# Patient Record
Sex: Female | Born: 1945 | Race: White | Hispanic: No | Smoking: Never smoker
Health system: Southern US, Community
[De-identification: ages and names within clinical notes are randomized; demographics above are authoritative.]

## PROBLEM LIST (undated history)

## (undated) DIAGNOSIS — M1990 Unspecified osteoarthritis, unspecified site: Secondary | ICD-10-CM

## (undated) DIAGNOSIS — E039 Hypothyroidism, unspecified: Secondary | ICD-10-CM

## (undated) DIAGNOSIS — E079 Disorder of thyroid, unspecified: Secondary | ICD-10-CM

## (undated) DIAGNOSIS — D126 Benign neoplasm of colon, unspecified: Secondary | ICD-10-CM

## (undated) DIAGNOSIS — D72819 Decreased white blood cell count, unspecified: Secondary | ICD-10-CM

## (undated) HISTORY — DX: Disorder of thyroid, unspecified: E07.9

## (undated) HISTORY — PX: ABDOMINAL HYSTERECTOMY: SHX81

## (undated) HISTORY — DX: Unspecified osteoarthritis, unspecified site: M19.90

## (undated) HISTORY — PX: COLONOSCOPY: SHX174

## (undated) HISTORY — PX: CARPAL TUNNEL RELEASE: SHX101

## (undated) HISTORY — DX: Decreased white blood cell count, unspecified: D72.819

## (undated) HISTORY — PX: OTHER SURGICAL HISTORY: SHX169

---

## 2005-03-12 ENCOUNTER — Ambulatory Visit: Payer: Self-pay | Admitting: Family Medicine

## 2005-04-04 ENCOUNTER — Ambulatory Visit: Payer: Self-pay | Admitting: Family Medicine

## 2005-12-18 ENCOUNTER — Encounter: Payer: Self-pay | Admitting: Family Medicine

## 2006-03-17 ENCOUNTER — Ambulatory Visit: Payer: Self-pay | Admitting: Family Medicine

## 2006-07-31 ENCOUNTER — Ambulatory Visit: Payer: Self-pay | Admitting: Family Medicine

## 2006-11-25 ENCOUNTER — Ambulatory Visit: Payer: Self-pay | Admitting: Family Medicine

## 2007-01-21 ENCOUNTER — Ambulatory Visit: Payer: Self-pay | Admitting: Family Medicine

## 2007-04-23 ENCOUNTER — Encounter: Payer: Self-pay | Admitting: Family Medicine

## 2007-04-23 DIAGNOSIS — R799 Abnormal finding of blood chemistry, unspecified: Secondary | ICD-10-CM | POA: Insufficient documentation

## 2007-04-23 DIAGNOSIS — K449 Diaphragmatic hernia without obstruction or gangrene: Secondary | ICD-10-CM | POA: Insufficient documentation

## 2007-04-23 DIAGNOSIS — N951 Menopausal and female climacteric states: Secondary | ICD-10-CM

## 2007-04-23 DIAGNOSIS — E785 Hyperlipidemia, unspecified: Secondary | ICD-10-CM | POA: Insufficient documentation

## 2007-04-27 ENCOUNTER — Ambulatory Visit: Payer: Self-pay | Admitting: Family Medicine

## 2007-04-27 DIAGNOSIS — M8580 Other specified disorders of bone density and structure, unspecified site: Secondary | ICD-10-CM

## 2007-04-27 DIAGNOSIS — F5102 Adjustment insomnia: Secondary | ICD-10-CM | POA: Insufficient documentation

## 2007-04-29 LAB — CONVERTED CEMR LAB
ALT: 13 units/L (ref 0–40)
AST: 19 units/L (ref 0–37)
Albumin: 3.9 g/dL (ref 3.5–5.2)
Basophils Absolute: 0 10*3/uL (ref 0.0–0.1)
Calcium: 9.3 mg/dL (ref 8.4–10.5)
Chloride: 105 meq/L (ref 96–112)
Creatinine, Ser: 0.9 mg/dL (ref 0.4–1.2)
Direct LDL: 145 mg/dL
Eosinophils Relative: 3.1 % (ref 0.0–5.0)
HCT: 37.7 % (ref 36.0–46.0)
MCHC: 35.4 g/dL (ref 30.0–36.0)
Neutrophils Relative %: 49.5 % (ref 43.0–77.0)
Platelets: 246 10*3/uL (ref 150–400)
RBC: 4.11 M/uL (ref 3.87–5.11)
RDW: 12.3 % (ref 11.5–14.6)
Sodium: 141 meq/L (ref 135–145)
Total Bilirubin: 2 mg/dL — ABNORMAL HIGH (ref 0.3–1.2)
Total CHOL/HDL Ratio: 5
Triglycerides: 134 mg/dL (ref 0–149)
WBC: 4.9 10*3/uL (ref 4.5–10.5)

## 2007-07-30 ENCOUNTER — Encounter: Payer: Self-pay | Admitting: Family Medicine

## 2007-11-05 ENCOUNTER — Telehealth: Payer: Self-pay | Admitting: Family Medicine

## 2007-11-26 ENCOUNTER — Encounter: Payer: Self-pay | Admitting: Family Medicine

## 2007-11-26 ENCOUNTER — Ambulatory Visit: Payer: Self-pay | Admitting: Unknown Physician Specialty

## 2007-12-21 ENCOUNTER — Encounter: Payer: Self-pay | Admitting: Family Medicine

## 2007-12-29 ENCOUNTER — Encounter (INDEPENDENT_AMBULATORY_CARE_PROVIDER_SITE_OTHER): Payer: Self-pay | Admitting: *Deleted

## 2008-03-15 ENCOUNTER — Telehealth: Payer: Self-pay | Admitting: Family Medicine

## 2008-04-21 ENCOUNTER — Ambulatory Visit: Payer: Self-pay | Admitting: Family Medicine

## 2008-04-28 ENCOUNTER — Telehealth: Payer: Self-pay | Admitting: Family Medicine

## 2008-04-28 DIAGNOSIS — K117 Disturbances of salivary secretion: Secondary | ICD-10-CM | POA: Insufficient documentation

## 2008-05-01 ENCOUNTER — Ambulatory Visit: Payer: Self-pay | Admitting: Family Medicine

## 2008-05-02 DIAGNOSIS — D72819 Decreased white blood cell count, unspecified: Secondary | ICD-10-CM | POA: Insufficient documentation

## 2008-05-02 LAB — CONVERTED CEMR LAB
ALT: 15 units/L (ref 0–35)
Alkaline Phosphatase: 64 units/L (ref 39–117)
Basophils Absolute: 0 10*3/uL (ref 0.0–0.1)
Bilirubin, Direct: 0.2 mg/dL (ref 0.0–0.3)
CO2: 29 meq/L (ref 19–32)
Calcium: 9.1 mg/dL (ref 8.4–10.5)
Cholesterol: 166 mg/dL (ref 0–200)
GFR calc Af Amer: 129 mL/min
Glucose, Bld: 99 mg/dL (ref 70–99)
HDL: 39.5 mg/dL (ref >39.0)
LDL Cholesterol: 113 mg/dL — ABNORMAL HIGH (ref 0–99)
Lymphocytes Relative: 36.2 % (ref 12.0–46.0)
MCHC: 34.9 g/dL (ref 30.0–36.0)
Monocytes Relative: 7.1 % (ref 3.0–12.0)
Neutro Abs: 1.6 10*3/uL (ref 1.4–7.7)
Neutrophils Relative %: 53 % (ref 43.0–77.0)
Platelets: 175 10*3/uL (ref 150–400)
Potassium: 4.3 meq/L (ref 3.5–5.1)
RDW: 11.9 % (ref 11.5–14.6)
Sodium: 143 meq/L (ref 135–145)
Total Bilirubin: 2.2 mg/dL — ABNORMAL HIGH (ref 0.3–1.2)
Total CHOL/HDL Ratio: 4.2
Total Protein: 6.3 g/dL (ref 6.0–8.3)
Triglycerides: 68 mg/dL (ref 0–149)
VLDL: 14 mg/dL (ref 0–40)

## 2008-05-03 ENCOUNTER — Encounter (INDEPENDENT_AMBULATORY_CARE_PROVIDER_SITE_OTHER): Payer: Self-pay | Admitting: *Deleted

## 2008-05-03 LAB — CONVERTED CEMR LAB
ANA Titer 1: 1:40 {titer} — ABNORMAL HIGH
Anti Nuclear Antibody(ANA): POSITIVE — AB
Vit D, 1,25-Dihydroxy: 22 — ABNORMAL LOW (ref 30–89)

## 2008-05-04 ENCOUNTER — Ambulatory Visit: Payer: Self-pay | Admitting: Oncology

## 2008-05-10 ENCOUNTER — Ambulatory Visit: Payer: Self-pay | Admitting: Internal Medicine

## 2008-05-16 ENCOUNTER — Encounter: Payer: Self-pay | Admitting: Family Medicine

## 2008-05-16 LAB — CBC WITH DIFFERENTIAL (CANCER CENTER ONLY)
BASO#: 0 10*3/uL (ref 0.0–0.2)
EOS%: 2.3 % (ref 0.0–7.0)
LYMPH%: 32.2 % (ref 14.0–48.0)
MCH: 30.4 pg (ref 26.0–34.0)
MCHC: 35.8 g/dL (ref 32.0–36.0)
MONO%: 4.3 % (ref 0.0–13.0)
NEUT#: 2.7 10*3/uL (ref 1.5–6.5)
Platelets: 231 10*3/uL (ref 145–400)

## 2008-05-16 LAB — MORPHOLOGY - CHCC SATELLITE: RBC Comments: NORMAL

## 2008-05-18 LAB — COMPREHENSIVE METABOLIC PANEL
BUN: 12 mg/dL (ref 6–23)
CO2: 27 mEq/L (ref 19–32)
Calcium: 9.3 mg/dL (ref 8.4–10.5)
Chloride: 106 mEq/L (ref 96–112)
Creatinine, Ser: 0.68 mg/dL (ref 0.40–1.20)
Total Bilirubin: 2.1 mg/dL — ABNORMAL HIGH (ref 0.3–1.2)

## 2008-05-18 LAB — IRON AND TIBC
%SAT: 34 % (ref 20–55)
Iron: 74 ug/dL (ref 42–145)
TIBC: 218 ug/dL — ABNORMAL LOW (ref 250–470)
UIBC: 144 ug/dL

## 2008-05-18 LAB — FOLATE: Folate: >20 ng/mL

## 2008-05-18 LAB — PROTEIN ELECTROPHORESIS, SERUM
Alpha-1-Globulin: 4.7 % (ref 2.9–4.9)
Alpha-2-Globulin: 9 % (ref 7.1–11.8)
Beta 2: 6.6 % — ABNORMAL HIGH (ref 3.2–6.5)
Beta Globulin: 5.6 % (ref 4.7–7.2)
Gamma Globulin: 16.3 % (ref 11.1–18.8)

## 2008-05-18 LAB — FERRITIN: Ferritin: 202 ng/mL (ref 10–291)

## 2008-05-18 LAB — VITAMIN B12: Vitamin B-12: 511 pg/mL (ref 211–911)

## 2008-05-23 ENCOUNTER — Ambulatory Visit: Payer: Self-pay | Admitting: Family Medicine

## 2008-06-06 ENCOUNTER — Encounter: Payer: Self-pay | Admitting: Family Medicine

## 2008-06-15 ENCOUNTER — Ambulatory Visit: Payer: Self-pay | Admitting: Oncology

## 2008-06-20 ENCOUNTER — Encounter: Payer: Self-pay | Admitting: Family Medicine

## 2008-06-20 LAB — CBC WITH DIFFERENTIAL (CANCER CENTER ONLY)
BASO#: 0 10*3/uL (ref 0.0–0.2)
EOS%: 1.9 % (ref 0.0–7.0)
HCT: 32.2 % — ABNORMAL LOW (ref 34.8–46.6)
HGB: 11.2 g/dL — ABNORMAL LOW (ref 11.6–15.9)
MCH: 29.7 pg (ref 26.0–34.0)
MCHC: 34.7 g/dL (ref 32.0–36.0)
MCV: 85 fL (ref 81–101)
MONO%: 4.9 % (ref 0.0–13.0)
NEUT%: 56.2 % (ref 39.6–80.0)

## 2008-06-27 ENCOUNTER — Ambulatory Visit: Payer: Self-pay | Admitting: Internal Medicine

## 2008-06-29 ENCOUNTER — Ambulatory Visit: Payer: Self-pay | Admitting: Family Medicine

## 2008-07-04 LAB — CONVERTED CEMR LAB: Vit D, 1,25-Dihydroxy: 48 (ref 30–89)

## 2008-07-05 ENCOUNTER — Telehealth (INDEPENDENT_AMBULATORY_CARE_PROVIDER_SITE_OTHER): Payer: Self-pay | Admitting: *Deleted

## 2008-07-07 ENCOUNTER — Ambulatory Visit: Payer: Self-pay | Admitting: Internal Medicine

## 2008-11-13 ENCOUNTER — Ambulatory Visit: Payer: Self-pay | Admitting: Oncology

## 2008-11-14 ENCOUNTER — Ambulatory Visit: Payer: Self-pay | Admitting: Family Medicine

## 2008-11-14 LAB — CBC WITH DIFFERENTIAL (CANCER CENTER ONLY)
BASO#: 0 10*3/uL (ref 0.0–0.2)
HCT: 37.1 % (ref 34.8–46.6)
HGB: 12.8 g/dL (ref 11.6–15.9)
LYMPH#: 1.7 10*3/uL (ref 0.9–3.3)
MCHC: 34.5 g/dL (ref 32.0–36.0)
MONO#: 0.2 10*3/uL (ref 0.1–0.9)
NEUT%: 55.9 % (ref 39.6–80.0)
WBC: 4.7 10*3/uL (ref 3.9–10.0)

## 2008-11-21 ENCOUNTER — Encounter: Payer: Self-pay | Admitting: Family Medicine

## 2008-11-24 ENCOUNTER — Encounter (INDEPENDENT_AMBULATORY_CARE_PROVIDER_SITE_OTHER): Payer: Self-pay | Admitting: *Deleted

## 2009-04-05 ENCOUNTER — Telehealth: Payer: Self-pay | Admitting: Family Medicine

## 2009-04-05 DIAGNOSIS — Z8669 Personal history of other diseases of the nervous system and sense organs: Secondary | ICD-10-CM

## 2009-04-12 ENCOUNTER — Encounter: Payer: Self-pay | Admitting: Family Medicine

## 2009-10-19 ENCOUNTER — Telehealth: Payer: Self-pay | Admitting: Family Medicine

## 2009-10-22 ENCOUNTER — Encounter: Payer: Self-pay | Admitting: Family Medicine

## 2009-11-17 DIAGNOSIS — G459 Transient cerebral ischemic attack, unspecified: Secondary | ICD-10-CM

## 2009-11-17 HISTORY — DX: Transient cerebral ischemic attack, unspecified: G45.9

## 2009-11-22 ENCOUNTER — Encounter: Payer: Self-pay | Admitting: Family Medicine

## 2009-11-23 ENCOUNTER — Telehealth: Payer: Self-pay | Admitting: Family Medicine

## 2009-12-04 IMAGING — NM NM THYROID IMAGING W/ UPTAKE SINGLE (24 HR)
1 series · 3 of 3 positions shown · non-contrast
Comparison: none

REASON FOR EXAM: hyperthyroidism
COMMENTS:

PROCEDURE:     NM  - NM THYROID Q-JPH 24 HR [DATE]  [DATE]
RESULT:     The patient was given a dose of 146 uCi of Q-JPH. Uptake value
at 7 hours is 33.7%. Uptake at 26 hours is 46.5%. Images of the thyroid
lobes show relatively homogeneous uptake without a focal hyperactive or
hypoactive region.

[Series 1000: (id) thyroid scan · 2.40mm/px · 3 of 3 slices shown]
[im 1/3]
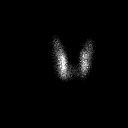
[im 2/3  full-range]
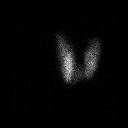
[im 3/3  full-range]
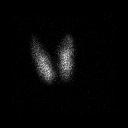

[3 of 3 positions shown; findings below may reference images not displayed]

IMPRESSION: Findings which may represent thyroiditis and
hyperthyroidism.

## 2009-12-18 ENCOUNTER — Telehealth: Payer: Self-pay | Admitting: Family Medicine

## 2009-12-29 ENCOUNTER — Inpatient Hospital Stay: Payer: Self-pay | Admitting: Internal Medicine

## 2009-12-30 ENCOUNTER — Encounter: Payer: Self-pay | Admitting: Family Medicine

## 2010-01-03 ENCOUNTER — Ambulatory Visit: Payer: Self-pay | Admitting: Family Medicine

## 2010-01-03 ENCOUNTER — Encounter: Payer: Self-pay | Admitting: Family Medicine

## 2010-01-07 ENCOUNTER — Encounter (INDEPENDENT_AMBULATORY_CARE_PROVIDER_SITE_OTHER): Payer: Self-pay | Admitting: *Deleted

## 2010-01-07 ENCOUNTER — Encounter: Payer: Self-pay | Admitting: Family Medicine

## 2010-01-17 ENCOUNTER — Encounter: Payer: Self-pay | Admitting: Family Medicine

## 2010-02-14 ENCOUNTER — Ambulatory Visit: Payer: Self-pay | Admitting: Family Medicine

## 2010-02-19 ENCOUNTER — Encounter: Payer: Self-pay | Admitting: Family Medicine

## 2010-10-23 ENCOUNTER — Ambulatory Visit: Payer: Self-pay | Admitting: Family Medicine

## 2010-10-23 DIAGNOSIS — R29898 Other symptoms and signs involving the musculoskeletal system: Secondary | ICD-10-CM

## 2010-10-23 DIAGNOSIS — M1A9XX1 Chronic gout, unspecified, with tophus (tophi): Secondary | ICD-10-CM

## 2010-10-23 DIAGNOSIS — D492 Neoplasm of unspecified behavior of bone, soft tissue, and skin: Secondary | ICD-10-CM | POA: Insufficient documentation

## 2010-10-25 LAB — CONVERTED CEMR LAB: Uric Acid, Serum: 4.7 mg/dL (ref 2.4–7.0)

## 2010-11-04 ENCOUNTER — Telehealth: Payer: Self-pay | Admitting: Family Medicine

## 2010-12-15 LAB — CONVERTED CEMR LAB
Bilirubin Urine: NEGATIVE
Blood in Urine, dipstick: NEGATIVE
Specific Gravity, Urine: 1.02
Urobilinogen, UA: 0.2
pH: 6

## 2010-12-17 NOTE — Letter (Signed)
Summary: [facility]  [facility]   Imported By: Maryln Gottron 02/01/2010 15:36:00  _____________________________________________________________________  External Attachment:    Type:   Image     Comment:   External Document

## 2010-12-17 NOTE — Assessment & Plan Note (Signed)
Summary: CHECK KNOT ON RIGHT ARM/CLE   Vital Signs:  Patient profile:   65 year old female Height:      66.5 inches Weight:      148.25 pounds BMI:     23.65 Temp:     98.9 degrees F oral Pulse rate:   76 / minute Pulse rhythm:   regular BP sitting:   100 / 62  (left arm) Cuff size:   regular  Vitals Entered By: Lewanda Rife LPN (October 23, 2010 11:49 AM) CC: ck knot on upper rt arm   History of Present Illness: had a spot pop up on her arm noticed on nov 26th  has had pain to raise arm all the way for a while - chiropractor and hot compresses helped  that did improve  happened to feel spot on inner upper arm and it was sore  now is smaller than it was is not as sore but a bit tender  did have a bruise for a while -- pretty mild   no trauma that she knows of   this is same arm with carpal tunnel surg in jan  immediately got 5th trigger finger  never did go to therapy  tries to do that at home    feb had tendonitis in other arm   has knots on other elbow that have been here for a while has hx of bad toe pain one time- wonders if that was gout  Allergies: 1)  Sulfa  Past History:  Past Medical History: Last updated: 05/23/2008 Hyperlipidemia osteopenia zoster- arm (past) low vitamin D level borderline ANA hyperthyroid   heme-- Dr Park Breed endo-- Dr Clearance Coots  Past Surgical History: Last updated: 03-06-2010 Hysterectomy - fibroids.  ovaries intact Dexa- normal, osteopenia (02/2001)     osteopenia (09/2003),   decreased BMD (11/2005) Stress echo- neg ([facility] 06/2006) colonscopy 1/09 polyp, diverticulosis hand surg R hand - [facility]  Family History: Last updated: 03-06-10 Father: deceased- CVA, HTN Mother: deceased- lymphoma, arthritis daughter - lymphoma  Siblings: 3 brothers, 2 deceased- 1 from MVA 1 from lymphoma brother CVA P aunt breast ca ? M aunt with breast cancer   aunts- ? ca ? aunt with OP  Social History: Last updated: 04/21/2008 Marital  Status:  Children: 2 Occupation: [employer] non smoker  occ glass of wine  occ goes to [employer]   Risk Factors: Smoking Status: never (04/23/2007)  Review of Systems General:  Denies chills, fatigue, fever, loss of appetite, and malaise. Eyes:  Denies blurring and eye irritation. CV:  Denies chest pain or discomfort and palpitations. Resp:  Denies cough, shortness of breath, and wheezing. GI:  Denies abdominal pain, change in bowel habits, indigestion, nausea, and vomiting. GU:  Denies dysuria and urinary frequency. MS:  Complains of stiffness; denies joint redness and joint swelling; R shoulder was stiff- but better now . Derm:  Denies itching, lesion(s), poor wound healing, and rash. Neuro:  Denies headaches, numbness, and tingling. Endo:  Denies cold intolerance and heat intolerance. Heme:  Denies abnormal bruising and bleeding.  Physical Exam  General:  Well-developed,well-nourished,in no acute distress; alert,appropriate and cooperative throughout examination Head:  normocephalic, atraumatic, and no abnormalities observed.   Eyes:  vision grossly intact, pupils equal, pupils round, and pupils reactive to light.   Neck:  No deformities, masses, or tenderness noted. Chest Wall:  No deformities, masses, or tenderness noted. Lungs:  Normal respiratory effort, chest expands symmetrically. Lungs are clear to auscultation, no crackles or wheezes.  Heart:  Normal rate and regular rhythm. S1 and S2 normal without gallop, murmur, click, rub or other extra sounds. Msk:  L elbow - several firm 1-2 cm masses over olcrenon that resemble gouty tophi nl rom  no tenderness or skin change  no other acute joint changes Extremities:  No clubbing, cyanosis, edema, or deformity noted with normal full range of motion of all joints.   Neurologic:  sensation intact to light touch, gait normal, and DTRs symmetrical and normal.   Skin:  mobile firm but rubbery knot about 1.5 cm deep in inner upper R  arm slt tender no skin change    Cervical Nodes:  No lymphadenopathy noted Psych:  normal affect, talkative and pleasant    Impression & Recommendations:  Problem # 1:  NEOPLASMS UNSPEC NATURE BONE SOFT TISSUE&SKIN (ICD-239.2) Assessment New firm mass that is mobile and rubbery consistency inner R arm -- mildly tender ? ganglionic cyst or other also hard mobile knots over 1 cm L elbow - that resemble gouty tophii will check uric acid today(also gave aafp handout on low purine diet)  ref to ortho for further eval Orders: Orthopedic Referral (Ortho)  Problem # 2:  OTHER SYMPTOMS REFERABLE JOINT SITE UNSPECIFIED (ICD-719.60) Assessment: New nodule like lesions on L elbow-- see above for plan ? tophii or other   Complete Medication List: 1)  Aleve 220 Mg Tabs (Naproxen sodium) .... Otc as directed. 2)  Synthroid 50 Mcg Tabs (Levothyroxine sodium) .... Take 1 tablet by mouth once a day  Other Orders: Venipuncture (98119) TLB-Uric Acid, Blood (84550-URIC)  Patient Instructions: 1)  we will do ortho referral at check out  2)  use warm compress on lumps if they hurt  3)  I am going to check uric acid level on you for possible gout    Orders Added: 1)  Venipuncture [36415] 2)  TLB-Uric Acid, Blood [84550-URIC] 3)  Orthopedic Referral [Ortho] 4)  Est. Patient Level IV [14782]    Current Allergies (reviewed today): SULFA

## 2010-12-17 NOTE — Miscellaneous (Signed)
   Clinical Lists Changes  Observations: Added new observation of MAMMO DUE: 01/03/11 (01/03/2010 15:32) Added new observation of MAMMOGRAM: Normal (01/03/2010 15:32) Added new observation of BONEDENSRES: Normal (01/03/2010 15:32)

## 2010-12-17 NOTE — Assessment & Plan Note (Signed)
Summary: LEFT HAND PAIN/CLE   Vital Signs:  Patient profile:   65 year old female Height:      66.5 inches Weight:      148 pounds BMI:     23.62 Temp:     98.5 degrees F oral Pulse rate:   72 / minute Pulse rhythm:   regular BP sitting:   122 / 68  (right arm) Cuff size:   regular  Vitals Entered By: Lewanda Rife LPN (2010/03/15 10:51 AM) CC: Left hand and wrist pain since blood drawn in left wrist at hospitilization at [facility] in February.   History of Present Illness: L hand and wrist are really hurting  now still having some pain - lateral hand and thumb joint (? may have some arthritis )  got up monday am and pain was much worse  did get swollen at that time (still a little) and still a little red on the back of her hand at times   used a magnet on hand from her brother in law -- ? if that helped  using aleve -- ? may have helped some just for 2 days  tried to use moist heat on monday  was painful to move at all now is improved    in hosp had blood draw from that hand  had redness and swelling after-  saw Dr Patsy Lager and thought it was traumatic pain it really hurt when they did it -- she thought something was wrong  ? infection in vein and given 2 rounds of keflex   in hosp for amnesia after bad news of daughter getting lymphoma and uti her memory is fine now still taking the news very hard - having difficult time caring for her   Allergies: 1)  Sulfa  Past History:  Past Medical History: Last updated: 05/23/2008 Hyperlipidemia osteopenia zoster- arm (past) low vitamin D level borderline ANA hyperthyroid   heme-- Dr Park Breed endo-- Dr Clearance Coots  Family History: Last updated: 2010/03/15 Father: deceased- CVA, HTN Mother: deceased- lymphoma, arthritis daughter - lymphoma  Siblings: 3 brothers, 2 deceased- 1 from MVA 1 from lymphoma brother CVA P aunt breast ca ? M aunt with breast cancer   aunts- ? ca ? aunt with OP  Social History: Last updated:  04/21/2008 Marital Status:  Children: 2 Occupation: [employer] non smoker  occ glass of wine  occ goes to [employer]   Risk Factors: Smoking Status: never (04/23/2007)  Past Surgical History: Hysterectomy - fibroids.  ovaries intact Dexa- normal, osteopenia (02/2001)     osteopenia (09/2003),   decreased BMD (11/2005) Stress echo- neg ([facility] 06/2006) colonscopy 1/09 polyp, diverticulosis hand surg R hand - [facility]  Family History: Father: deceased- CVA, HTN Mother: deceased- lymphoma, arthritis daughter - lymphoma  Siblings: 3 brothers, 2 deceased- 1 from MVA 1 from lymphoma brother CVA P aunt breast ca ? M aunt with breast cancer   aunts- ? ca ? aunt with OP  Review of Systems General:  Complains of fatigue; denies chills, fever, and sweats. Eyes:  Denies blurring and discharge. CV:  Denies chest pain or discomfort, palpitations, and shortness of breath with exertion. Resp:  Denies cough and shortness of breath. GI:  Denies diarrhea and nausea. MS:  Complains of joint pain, joint redness, joint swelling, and stiffness; denies muscle aches and cramps. Derm:  Denies itching, lesion(s), poor wound healing, and rash. Neuro:  Denies headaches, numbness, and tingling. Psych:  very stressed about her daughter . Endo:  Denies cold intolerance, excessive thirst, excessive urination, and heat intolerance. Heme:  Denies abnormal bruising and bleeding.  Physical Exam  General:  Well-developed,well-nourished,in no acute distress; alert,appropriate and cooperative throughout examination Head:  normocephalic, atraumatic, and no abnormalities observed.   Eyes:  vision grossly intact, pupils equal, pupils round, and pupils reactive to light.   Neck:  No deformities, masses, or tenderness noted. Lungs:  Normal respiratory effort, chest expands symmetrically. Lungs are clear to auscultation, no crackles or wheezes. Heart:  Normal rate and regular rhythm. S1 and S2 normal without gallop, murmur,  click, rub or other extra sounds. Msk:  L wrist - diffusely swollen no warmth/ redness or crepitice most pain on flex of wrist  equivicol finkelstein test nl prontation/ supination nl perf   R hand - well healed small scar on wrist  Pulses:  R and L carotid,radial,femoral,dorsalis pedis and posterior tibial pulses are full and equal bilaterally Extremities:  no cce in legs  Neurologic:  strength normal in all extremities, sensation intact to light touch, sensation intact to pinprick, gait normal, and DTRs symmetrical and normal.   Skin:  Intact without suspicious lesions or rashes Cervical Nodes:  No lymphadenopathy noted Psych:  tearful when disc daughter's dx and tx  does freely disc these issues fair eye contact    Impression & Recommendations:  Problem # 1:  WRIST PAIN, LEFT (ICD-719.43) Assessment Deteriorated now with joint tenderness and swelling (no redness or heat) rev prev hosp and clinic records about this in detail  originally started with painful blood draw -- puzzling picture  I feel she needs to see hand specialist for films and further eval (seems like more of joint issue now) also has nodules on her elbow _  ?something rheum/ no hx of gout pt cannot remember name of her rheumatologist also she is having hand pain after surgery at [facility] on R hand and wants to disc opt for that -- is interested in PT if able  she will call back with name of Dr she wants Orders: Radiology Referral (Radiology)  Problem # 2:  STRESS REACTION, ACUTE, WITH EMOTIONAL DISTURBANCE (ICD-308.0) Assessment: New over daughter's recent tx for lymphoma pt tearful today does not want counseling at this time disc situational stress/ coping skils/ support sources/  symptoms. tx opt in detail   Complete Medication List: 1)  Synthroid 25 Mcg Tabs (Levothyroxine sodium) .... Once daily 2)  Osteo Complex Caps (Multiple vitamins-minerals) .... Otc as directed. 3)  Aleve 220 Mg Tabs (Naproxen  sodium) .... Otc as directed. 4)  Fish Oil Oil (Fish oil) .... Take 1 tablet by mouth once a day  Patient Instructions: 1)  we will do hand specialist /ortho referral  2)  make sure the doctor you choose is covered by your insurance  3)  continue aleve 1-2 pills two times a day with food for at least a week 4)  continue the moist heat   Current Allergies (reviewed today): SULFA

## 2010-12-17 NOTE — Letter (Signed)
Summary: [facility]   Imported By: Maryln Gottron 02/25/2010 12:57:18  _____________________________________________________________________  External Attachment:    Type:   Image     Comment:   External Document

## 2010-12-17 NOTE — Letter (Signed)
Summary: [facility]   Imported By: Lanelle Bal 01/04/2010 12:14:38  _____________________________________________________________________  External Attachment:    Type:   Image     Comment:   External Document

## 2010-12-17 NOTE — Letter (Signed)
Summary: [facility]   Imported By: Lanelle Bal 01/11/2010 10:28:38  _____________________________________________________________________  External Attachment:    Type:   Image     Comment:   External Document

## 2010-12-17 NOTE — Progress Notes (Signed)
Summary: Need order for Bone Density  Phone Note Call from Patient   Reason for Call: Referral Summary of Call: Pt need order for her Bone Density.Marland KitchenMarland KitchenPt has scheduled the appt at  [facility]. Wants to have the Bone Density sametime as the Mammogram Feb 7th 201.Marland KitchenMarland KitchenPts call back # [phone].Marland KitchenMarland KitchenDaine Gip  December 18, 2009 12:25 PM   Initial call taken by: Daine Gip,  December 18, 2009 12:25 PM  Follow-up for Phone Call        will do order and route to [facility] Follow-up by: Judith Part MD,  December 18, 2009 1:17 PM  Additional Follow-up for Phone Call Additional follow up Details #1::        Bone Density order faxed over to [facility]. Additional Follow-up by: Carlton Adam,  December 19, 2009 2:15 PM

## 2010-12-17 NOTE — Letter (Signed)
Summary: Results Follow up Letter  [place] [facility]  [address] [place], [place] [address]   Phone: [phone]  Fax: [phone]    01/07/2010 MRN: [id number]    [facility] [address] [place], [place]  [address]    Dear Jennifer Sellers,  The following are the results of your recent test(s):  Test         Result    Pap Smear:        Normal _____  Not Normal _____ Comments: ______________________________________________________ Cholesterol: LDL(Bad cholesterol):         Your goal is less than:         HDL (Good cholesterol):       Your goal is more than: Comments:  ______________________________________________________ Mammogram:        Normal ___X__  Not Normal _____ Comments:  Yearly follow up is recommended.   ___________________________________________________________________ Hemoccult:        Normal _____  Not normal _______ Comments:    _____________________________________________________________________ Other Tests:   Bone Density:  Normal.  Continue Calcium with               Vitamin D.    We routinely do not discuss normal results over the telephone.  If you desire a copy of the results, or you have any questions about this information we can discuss them at your next office visit.   Sincerely,    Marne A. Milinda Antis, M.D.  MAT:lsf

## 2010-12-17 NOTE — Progress Notes (Signed)
Summary: pt requests phone call  Phone Note Call from Patient Call back at [phone]   Caller: Patient Call For: Judith Part MD Summary of Call: Pt is asking that you call her regarding her EMG report.  Copy of this report mailed to pt per her request. Initial call taken by: Lowella Petties CMA,  November 23, 2009 9:15 AM  Follow-up for Phone Call        please mail her a copy let her know test results are consistant with carpal tunnel syndrome in R hand  I would usully ref to hand specialist for this- would she like a referral ? Follow-up by: Judith Part MD,  November 23, 2009 1:38 PM  Additional Follow-up for Phone Call Additional follow up Details #1::        Pt states that she would really like to talk to you about this.                  Lowella Petties CMA  November 23, 2009 2:12 PM  I called pt and reviewed study with her EMG/ NCV she saw hand specialist at [facility] yesterday and will probably opt for surgery as her symptoms are worsening   Additional Follow-up by: Judith Part MD,  November 23, 2009 5:00 PM

## 2010-12-17 NOTE — Assessment & Plan Note (Signed)
Summary: ? hand infection   Vital Signs:  Patient profile:   65 year old female Height:      66.5 inches Weight:      145.0 pounds BMI:     23.14 Temp:     97.9 degrees F oral Pulse rate:   80 / minute Pulse rhythm:   regular BP sitting:   110 / 70  (left arm) Cuff size:   regular  Vitals Entered By: Benny Lennert CMA Duncan Dull) (January 03, 2010 9:46 AM)  History of Present Illness: Chief complaint ? heand infection  65 year old female:  the patient had blood drawn 3 days ago at the radial aspect of her left forearm, and now she is having some discoloration, some mild redness and some pain at the venipuncture site.  She has been using some moist heat, and the painful area as changed from more the entire wrist and more localized area in the radial aspect of the wrist. This is localized and approximately 1-1/2 inches across  No other traumatic injury fall or fracture.  REVIEW OF SYSTEMS GEN: Acute illness details above. CV: No chest pain or SOB GI: No noted N or V Otherwise, pertinent positives and negatives are noted in the HPI.   GEN: Well-developed,well-nourished,in no acute distress; alert,appropriate and cooperative throughout examination HEENT: Normocephalic and atraumatic without obvious abnormalities. No apparent alopecia or balding. Ears, externally no deformities PULM: Breathing comfortably in no respiratory distress EXT: No clubbing, cyanosis, or edema PSYCH: Normally interactive. Cooperative during the interview. Pleasant. Friendly and conversant. Not anxious or depressed appearing. Normal, full affect.   Left hand: Full range of motion with good composite fist, nontender with flexion and extension at the wrist. There is no effusion in the wrist joint. Patient does have mildly positive for finklestein's test. There is some mild redness along the radial aspect, minimally. There is also some small degree of bruising present.  Allergies: 1)  Sulfa  Past  History:  Past medical, surgical, family and social histories (including risk factors) reviewed, and no changes noted (except as noted below).  Past Medical History: Reviewed history from 05/23/2008 and no changes required. Hyperlipidemia osteopenia zoster- arm (past) low vitamin D level borderline ANA hyperthyroid   heme-- Dr Park Breed endo-- Dr Clearance Coots  Past Surgical History: Reviewed history from 12/01/2007 and no changes required. Hysterectomy - fibroids.  ovaries intact Dexa- normal, osteopenia (02/2001)     osteopenia (09/2003),   decreased BMD (11/2005) Stress echo- neg ([facility] 06/2006) colonscopy 1/09 polyp, diverticulosis  Family History: Reviewed history from 11/14/2008 and no changes required. Father: deceased- CVA, HTN Mother: deceased- lymphoma, arthritis Siblings: 3 brothers, 2 deceased- 1 from MVA 1 from lymphoma brother CVA P aunt breast ca ? M aunt with breast cancer   aunts- ? ca ? aunt with OP  Social History: Reviewed history from 04/21/2008 and no changes required. Marital Status:  Children: 2 Occupation: [employer] non smoker  occ glass of wine  occ goes to [employer]    Impression & Recommendations:  Problem # 1:  WRIST PAIN, LEFT (ICD-719.43) Assessment New I think it is most likely due to acute trauma from venipuncture. There is some bruising present.  There some mild degree of redness and swelling, and I wouldn't place her on Keflex to cover for any early cellulitis.  Continue with Aleve p.o. b.i.d. and ice as needed.  Complete Medication List: 1)  Synthroid 25 Mcg Tabs (Levothyroxine sodium) .... Once daily 2)  Cephalexin 500 Mg Caps (  Cephalexin) .Marland Kitchen.. 1 by mouth 4 times daily Prescriptions: CEPHALEXIN 500 MG CAPS (CEPHALEXIN) 1 by mouth 4 times daily  #28 x 0   Entered and Authorized by:   Hannah Beat MD   Signed by:   Hannah Beat MD on 01/03/2010   Method used:   Print then Give to Patient   RxID:   [id number]   Current  Allergies (reviewed today): SULFA

## 2010-12-19 NOTE — Progress Notes (Signed)
Summary: appt w/ orthopedic  Phone Note Call from Patient Call back at [phone]   Caller: Patient Call For: Judith Part MD Summary of Call: Patient is seeing orthopedic this week for a knot that she had on her arm. She says that knot has gone away and she is wondering if you are okay with her canceling appointment. Pleae advise.  Initial call taken by: Melody Comas,  November 04, 2010 9:47 AM  Follow-up for Phone Call        that is fine with me if it is totally gone  Follow-up by: Judith Part MD,  November 04, 2010 1:18 PM  Additional Follow-up for Phone Call Additional follow up Details #1::        Left message for patient to call back. Lewanda Rife LPN  November 04, 2010 2:27 PM   Patient notified as instructed by telephone. Pt said it is totally gone and she will call and cancel appt.Lewanda Rife LPN  November 04, 2010 4:53 PM

## 2011-01-02 ENCOUNTER — Other Ambulatory Visit: Payer: Self-pay | Admitting: Family Medicine

## 2011-01-02 ENCOUNTER — Encounter (INDEPENDENT_AMBULATORY_CARE_PROVIDER_SITE_OTHER): Payer: Self-pay | Admitting: *Deleted

## 2011-01-02 ENCOUNTER — Telehealth (INDEPENDENT_AMBULATORY_CARE_PROVIDER_SITE_OTHER): Payer: Self-pay | Admitting: *Deleted

## 2011-01-02 ENCOUNTER — Other Ambulatory Visit (INDEPENDENT_AMBULATORY_CARE_PROVIDER_SITE_OTHER): Payer: 59

## 2011-01-02 ENCOUNTER — Telehealth: Payer: Self-pay | Admitting: Family Medicine

## 2011-01-02 DIAGNOSIS — E785 Hyperlipidemia, unspecified: Secondary | ICD-10-CM

## 2011-01-02 DIAGNOSIS — M1A9XX1 Chronic gout, unspecified, with tophus (tophi): Secondary | ICD-10-CM

## 2011-01-02 DIAGNOSIS — E079 Disorder of thyroid, unspecified: Secondary | ICD-10-CM

## 2011-01-02 DIAGNOSIS — D72819 Decreased white blood cell count, unspecified: Secondary | ICD-10-CM

## 2011-01-02 LAB — HEPATIC FUNCTION PANEL
Albumin: 3.7 g/dL (ref 3.5–5.2)
Alkaline Phosphatase: 87 U/L (ref 39–117)

## 2011-01-02 LAB — RENAL FUNCTION PANEL
Albumin: 3.7 g/dL (ref 3.5–5.2)
Calcium: 8.7 mg/dL (ref 8.4–10.5)
Chloride: 110 mEq/L (ref 96–112)
Phosphorus: 3.1 mg/dL (ref 2.3–4.6)
Potassium: 4.1 mEq/L (ref 3.5–5.1)
Sodium: 143 mEq/L (ref 135–145)

## 2011-01-02 LAB — CBC WITH DIFFERENTIAL/PLATELET
Basophils Relative: 1 % (ref 0.0–3.0)
Eosinophils Relative: 1.7 % (ref 0.0–5.0)
Hemoglobin: 11.9 g/dL — ABNORMAL LOW (ref 12.0–15.0)
Lymphocytes Relative: 43.5 % (ref 12.0–46.0)
MCHC: 35.1 g/dL (ref 30.0–36.0)
Monocytes Relative: 5.1 % (ref 3.0–12.0)
Neutro Abs: 1 10*3/uL — ABNORMAL LOW (ref 1.4–7.7)
Neutrophils Relative %: 48.7 % (ref 43.0–77.0)
RBC: 3.59 Mil/uL — ABNORMAL LOW (ref 3.87–5.11)
WBC: 2 10*3/uL — ABNORMAL LOW (ref 4.5–10.5)

## 2011-01-02 LAB — TSH: TSH: 1.95 u[IU]/mL (ref 0.35–5.50)

## 2011-01-02 LAB — LIPID PANEL: Cholesterol: 200 mg/dL (ref 0–200)

## 2011-01-03 ENCOUNTER — Encounter (INDEPENDENT_AMBULATORY_CARE_PROVIDER_SITE_OTHER): Payer: 59 | Admitting: Family Medicine

## 2011-01-03 ENCOUNTER — Encounter: Payer: Self-pay | Admitting: Family Medicine

## 2011-01-03 DIAGNOSIS — Z1231 Encounter for screening mammogram for malignant neoplasm of breast: Secondary | ICD-10-CM

## 2011-01-03 DIAGNOSIS — E079 Disorder of thyroid, unspecified: Secondary | ICD-10-CM

## 2011-01-03 DIAGNOSIS — E785 Hyperlipidemia, unspecified: Secondary | ICD-10-CM

## 2011-01-03 DIAGNOSIS — M899 Disorder of bone, unspecified: Secondary | ICD-10-CM

## 2011-01-03 DIAGNOSIS — D72819 Decreased white blood cell count, unspecified: Secondary | ICD-10-CM

## 2011-01-08 NOTE — Progress Notes (Signed)
Summary: CRITICAL LABS( WBC 2.0)  Phone Note Other Incoming   Caller: [facility] [place] [facility] Summary of Call: Critical lab WBC is 2.0 Initial call taken by: Liane Comber CMA Duncan Dull),  January 02, 2011 1:26 PM  Follow-up for Phone Call        please let pt know that her wbc is lower than it has been in the past -- 2.0 I want her to f/u with Dr Park Breed for this  I will do ref for Shirlee Limerick so we can get her appt   Follow-up by: Judith Part MD,  January 02, 2011 3:10 PM  Additional Follow-up for Phone Call Additional follow up Details #1::        Patient notified as instructed by telephone.  Dr Welton Flakes is now affiliated with [facility] in [place] and Shirlee Limerick is working on appt for pt. Shirlee Limerick will contact pt with appt info. Pt has appt to see Dr Milinda Antis 01/03/11 for CPX.Lewanda Rife LPN  January 02, 2011 4:47 PM

## 2011-01-08 NOTE — Progress Notes (Signed)
----   Converted from flag ---- ---- 01/02/2011 8:45 AM, Colon Flattery Tower MD wrote: please check renal/ hepatic/ lipid/ tsh / cbc with diff and uric acid for gout/ 244.9, 272 and leukopenia -thanks  ---- 12/31/2010 10:07 AM, Liane Comber CMA (AAMA) wrote: Lab orders please! Good Morning! This pt is scheduled for cpx labs [place], which labs to draw and dx codes to use? Thanks Tasha ------------------------------

## 2011-01-10 ENCOUNTER — Telehealth: Payer: Self-pay | Admitting: Family Medicine

## 2011-01-14 NOTE — Progress Notes (Signed)
Summary: Labs faxed to Dr Clearance Coots  ---- Converted from flag ---- ---- 01/03/2011 11:37 AM, Judith Part MD wrote: please send labs from Jennifer Sellers to Dr Clearance Coots - endo at [facility] - thanks ------------------------------  Copy of labs faxed to Dr [facility] at [phone] as instructed.

## 2011-01-14 NOTE — Assessment & Plan Note (Signed)
Summary: CPX/ALC   Vital Signs:  Patient profile:   65 year old female Height:      66.5 inches Weight:      150.75 pounds BMI:     24.05 Temp:     98.1 degrees F oral Pulse rate:   76 / minute Pulse rhythm:   regular BP sitting:   120 / 77  (left arm) Cuff size:   regular  Vitals Entered ByMelody Comas (January 03, 2011 10:53 AM) CC: cpx    History of Present Illness: here for check up of chronic med problems and to review health mt list  critical lab of wbc 2.0-- getting heme appt never been this low  has had a cold -- for a couple of weeks  still a little irritative cough -- and not really deep is overall getting better   went for some sclerotherapy on her legs   went and had her cataract surgery in october - then had to have laser -for scar tissue  still not seeing quite right -- sees a "slash" in her vision  one night - had triple vision for about 20 minutes  had been reading - ? eye fatigue  has appt in 1-2 weeks Dr Lisette Grinder    wants me to look at spot in her arm  knot is gone completely    wt up 2 lb two times a day 24  120/77 great bp  pap 2/07- no symptoms or problems at all  thinks she had ovarian US -- was ok -- Dr Harold Hedge  hysterectomy in the past   mam 2/11 self exam = no lumps- does check in the shower   colonosc due 2014 for polyp  dexa imp/ nl in 2/11 quit her calcium and vitamin D -- just wanted to quit everything   Td 08 declines other imms   thyroid stable - fine - sent to endo   lipids with LDL 141 diet control this is up from LDL 113  is eating more fruit and veg  diet is healthy in general  gained 6 lb after TG a lot more nuts hdl went up too   Allergies: 1)  Sulfa  Past History:  Past Medical History: Hyperlipidemia osteopenia zoster- arm (past) low vitamin D level borderline ANA hyperthyroid   heme-- Dr Park Breed endo-- Dr Clearance Coots chiropractor   Physical Exam  General:  Well-developed,well-nourished,in  no acute distress; alert,appropriate and cooperative throughout examination Head:  normocephalic, atraumatic, and no abnormalities observed.   Eyes:  vision grossly intact, pupils equal, pupils round, and pupils reactive to light.  no conjunctival pallor, injection or icterus  Ears:  R ear normal and L ear normal.   Nose:  no nasal discharge.   Mouth:  pharynx pink and moist.   Neck:  supple with full rom and no masses or thyromegally, no JVD or carotid bruit  Chest Wall:  No deformities, masses, or tenderness noted. Breasts:  No mass, nodules, thickening, tenderness, bulging, retraction, inflamation, nipple discharge or skin changes noted.   Lungs:  Normal respiratory effort, chest expands symmetrically. Lungs are clear to auscultation, no crackles or wheezes. Heart:  Normal rate and regular rhythm. S1 and S2 normal without gallop, murmur, click, rub or other extra sounds. Abdomen:  Bowel sounds positive,abdomen soft and non-tender without masses, organomegaly or hernias noted. no renal bruits  Msk:  L elbow - several firm 1-2 cm masses over olcrenon that resemble gouty tophi nl rom  no tenderness or  skin change  no other acute joint changes  lump formerly in L upper arm is gone Pulses:  R and L carotid,radial,femoral,dorsalis pedis and posterior tibial pulses are full and equal bilaterally Extremities:  No clubbing, cyanosis, edema, or deformity noted with normal full range of motion of all joints.   Neurologic:  sensation intact to light touch, gait normal, and DTRs symmetrical and normal.   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) Assessment: Improved nl tsh- sent to endo, who manages this  no clinical changes   Problem # 3:  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 Take 1 tablet by mouth once a day  Other Orders: Gynecologic Referral (Gyn)  Patient Instructions: 1)  the current recommendation for calcium intake is 1200-1500 mg daily with -1000 IU of vitamin D  2)  we will keep working on hematology appt  3)  see Shirlee Limerick at check out for referral to gyn and for mammogram  4)  work on low sat fat diet 5)  you can raise your HDL (good cholesterol) by increasing exercise and eating omega 3 fatty acid supplement like fish oil or flax seed oil over the counter 6)  you can lower LDL (bad cholesterol) by limiting saturated fats in diet like red meat, fried foods, egg yolks, fatty breakfast meats, high fat dairy products and shellfish  7)  re check fasting labs lipid/ast/alt 272    Orders Added: 1)  Gynecologic Referral [Gyn] 2)  Radiology Referral [Radiology] 3)  Est. Patient Level IV [16109]    Current Allergies (reviewed today): SULFA

## 2011-01-15 ENCOUNTER — Encounter: Payer: 59 | Admitting: Oncology

## 2011-01-17 ENCOUNTER — Encounter (HOSPITAL_BASED_OUTPATIENT_CLINIC_OR_DEPARTMENT_OTHER): Payer: 59 | Admitting: Oncology

## 2011-01-17 ENCOUNTER — Other Ambulatory Visit: Payer: Self-pay | Admitting: Oncology

## 2011-01-17 ENCOUNTER — Encounter: Payer: Self-pay | Admitting: Family Medicine

## 2011-01-17 DIAGNOSIS — D72819 Decreased white blood cell count, unspecified: Secondary | ICD-10-CM

## 2011-01-17 DIAGNOSIS — M899 Disorder of bone, unspecified: Secondary | ICD-10-CM

## 2011-01-17 LAB — CBC WITH DIFFERENTIAL/PLATELET
Basophils Absolute: 0 10*3/uL (ref 0.0–0.1)
Eosinophils Absolute: 0.1 10*3/uL (ref 0.0–0.5)
HCT: 35.9 % (ref 34.8–46.6)
HGB: 12.4 g/dL (ref 11.6–15.9)
LYMPH%: 42.8 % (ref 14.0–49.7)
MCV: 93.5 fL (ref 79.5–101.0)
MONO#: 0.1 10*3/uL (ref 0.1–0.9)
MONO%: 6.1 % (ref 0.0–14.0)
NEUT#: 1.1 10*3/uL — ABNORMAL LOW (ref 1.5–6.5)
Platelets: 169 10*3/uL (ref 145–400)
WBC: 2.4 10*3/uL — ABNORMAL LOW (ref 3.9–10.3)

## 2011-01-17 LAB — MORPHOLOGY: PLT EST: ADEQUATE

## 2011-01-20 ENCOUNTER — Encounter: Payer: Self-pay | Admitting: Family Medicine

## 2011-01-21 ENCOUNTER — Encounter: Payer: Self-pay | Admitting: Family Medicine

## 2011-01-23 ENCOUNTER — Encounter: Payer: Self-pay | Admitting: Family Medicine

## 2011-01-24 ENCOUNTER — Encounter: Payer: Self-pay | Admitting: Family Medicine

## 2011-01-29 ENCOUNTER — Telehealth: Payer: Self-pay | Admitting: Family Medicine

## 2011-02-04 NOTE — Letter (Signed)
Summary: [facility]  [facility]   Imported By: Lanelle Bal 01/30/2011 11:25:07  _____________________________________________________________________  External Attachment:    Type:   Image     Comment:   External Document

## 2011-02-04 NOTE — Letter (Signed)
Summary: [facility]   [facility]   Imported By: Kassie Mends 01/28/2011 08:33:41  _____________________________________________________________________  External Attachment:    Type:   Image     Comment:   External Document

## 2011-02-04 NOTE — Progress Notes (Signed)
Summary: Blood vessel on forehead  Phone Note Call from Patient   Caller: Patient Call For: Judith Part MD Summary of Call: Pt came to office and said that blood vessel popped up on left upper forehead last night. No h/a, no dizziness,no vision changes pt just noticed the blood vessel raised on her forehead. Pt wants to know if she should be concerned about this and be seen or should she just watch it.Please advise.  Initial call taken by: Lewanda Rife LPN,  January 29, 2011 5:08 PM  Follow-up for Phone Call        that really does not sound concerning but if it persists I would only know by seeing it -- so if persistant or any new symptoms- please f/u Follow-up by: Judith Part MD,  January 29, 2011 5:29 PM  Additional Follow-up for Phone Call Additional follow up Details #1::        Patient notified as instructed by telephone. Pt said she forgot that she had laser eye surgery the end of Feb and she may call her eye dr to see if he thinks it could be related to that. Pt said she did not want to make an appt now but if it does not go away or if she develops any symptoms she will call for appt.Lewanda Rife LPN  January 29, 2011 5:31 PM

## 2011-02-13 NOTE — Letter (Signed)
Summary: [facility]   [facility]   Imported By: Kassie Mends 02/03/2011 10:17:04  _____________________________________________________________________  External Attachment:    Type:   Image     Comment:   External Document

## 2011-05-20 ENCOUNTER — Other Ambulatory Visit: Payer: Self-pay | Admitting: Oncology

## 2011-05-20 ENCOUNTER — Encounter (HOSPITAL_BASED_OUTPATIENT_CLINIC_OR_DEPARTMENT_OTHER): Payer: 59 | Admitting: Oncology

## 2011-05-20 DIAGNOSIS — M899 Disorder of bone, unspecified: Secondary | ICD-10-CM

## 2011-05-20 DIAGNOSIS — D72819 Decreased white blood cell count, unspecified: Secondary | ICD-10-CM

## 2011-05-20 LAB — CBC WITH DIFFERENTIAL/PLATELET
BASO%: 0.8 % (ref 0.0–2.0)
Eosinophils Absolute: 0.1 10*3/uL (ref 0.0–0.5)
LYMPH%: 38.9 % (ref 14.0–49.7)
MCHC: 35.5 g/dL (ref 31.5–36.0)
MCV: 92 fL (ref 79.5–101.0)
MONO#: 0.1 10*3/uL (ref 0.1–0.9)
MONO%: 4.6 % (ref 0.0–14.0)
NEUT#: 1.6 10*3/uL (ref 1.5–6.5)
Platelets: 154 10*3/uL (ref 145–400)
RBC: 3.72 10*6/uL (ref 3.70–5.45)
RDW: 13.2 % (ref 11.2–14.5)
WBC: 2.9 10*3/uL — ABNORMAL LOW (ref 3.9–10.3)

## 2011-06-23 ENCOUNTER — Telehealth: Payer: Self-pay | Admitting: *Deleted

## 2011-06-23 ENCOUNTER — Other Ambulatory Visit: Payer: Self-pay | Admitting: Family Medicine

## 2011-06-23 DIAGNOSIS — R7989 Other specified abnormal findings of blood chemistry: Secondary | ICD-10-CM

## 2011-06-23 NOTE — Telephone Encounter (Signed)
Patient is scheduled for labwork on 07/03/2011 for cholesterol and liver tests.  She says her MD that she has been seeing for her thyroid has retired and she wants to know if she could get her thyroid checked along with these scheduled labs?  Please advise patient.

## 2011-06-23 NOTE — Telephone Encounter (Signed)
Patient notified as instructed by telephone. Test added to 07/03/11 at 8:15 am lab appt.

## 2011-06-23 NOTE — Telephone Encounter (Signed)
That is fine - please add tsh and free T4 for dx of abn tsh-thanks

## 2011-06-27 ENCOUNTER — Ambulatory Visit: Payer: Self-pay

## 2011-07-03 ENCOUNTER — Other Ambulatory Visit: Payer: 59

## 2011-07-04 ENCOUNTER — Ambulatory Visit: Payer: Self-pay

## 2011-07-22 ENCOUNTER — Encounter: Payer: Self-pay | Admitting: Family Medicine

## 2011-08-07 ENCOUNTER — Other Ambulatory Visit: Payer: 59

## 2011-08-14 ENCOUNTER — Other Ambulatory Visit (INDEPENDENT_AMBULATORY_CARE_PROVIDER_SITE_OTHER): Payer: 59

## 2011-08-14 ENCOUNTER — Telehealth (INDEPENDENT_AMBULATORY_CARE_PROVIDER_SITE_OTHER): Payer: 59 | Admitting: *Deleted

## 2011-08-14 DIAGNOSIS — E785 Hyperlipidemia, unspecified: Secondary | ICD-10-CM

## 2011-08-14 NOTE — Telephone Encounter (Signed)
If it is for f/u- just lipids and liver (I will put that in ) If appt is for physical (which I cannot tell) - send this back to me  thankd

## 2011-08-14 NOTE — Telephone Encounter (Signed)
Dr Milinda Antis, can you please put in the order for pt's fasting labs. Orders for her thyroid labs are in.  I'm sorry to ask you to do this, but I havent yet learned how.

## 2011-08-15 LAB — COMPREHENSIVE METABOLIC PANEL
Albumin: 3.8 g/dL (ref 3.5–5.2)
CO2: 28 mEq/L (ref 19–32)
Calcium: 8.3 mg/dL — ABNORMAL LOW (ref 8.4–10.5)
Chloride: 110 mEq/L (ref 96–112)
GFR: 78.19 mL/min (ref >60.00)
Glucose, Bld: 89 mg/dL (ref 70–99)
Sodium: 143 mEq/L (ref 135–145)
Total Bilirubin: 1.9 mg/dL — ABNORMAL HIGH (ref 0.3–1.2)
Total Protein: 6.2 g/dL (ref 6.0–8.3)

## 2011-08-15 LAB — LIPID PANEL: Triglycerides: 73 mg/dL (ref 0.0–149.0)

## 2011-08-15 LAB — LDL CHOLESTEROL, DIRECT: Direct LDL: 152.5 mg/dL

## 2011-08-21 ENCOUNTER — Other Ambulatory Visit (INDEPENDENT_AMBULATORY_CARE_PROVIDER_SITE_OTHER): Payer: 59

## 2011-08-21 DIAGNOSIS — R7989 Other specified abnormal findings of blood chemistry: Secondary | ICD-10-CM

## 2011-08-21 DIAGNOSIS — R6889 Other general symptoms and signs: Secondary | ICD-10-CM

## 2011-08-21 DIAGNOSIS — E785 Hyperlipidemia, unspecified: Secondary | ICD-10-CM

## 2011-08-21 DIAGNOSIS — E079 Disorder of thyroid, unspecified: Secondary | ICD-10-CM

## 2011-08-21 LAB — T4, FREE: Free T4: 0.93 ng/dL (ref 0.60–1.60)

## 2011-08-25 ENCOUNTER — Other Ambulatory Visit: Payer: Self-pay

## 2011-08-25 MED ORDER — LEVOTHYROXINE SODIUM 50 MCG PO TABS: 50.0000 ug | ORAL_TABLET | Freq: Every day | ORAL | Status: DC

## 2011-08-25 NOTE — Telephone Encounter (Signed)
Returned call back to patient.  Patient has talked to [place] and said that she would fax in the prescription today.  Pt also got bill for Oct 23 2010 for Dr. Milinda Antis and questioning why this is first time receiving this bill.  Will follow up with billing/coding.

## 2011-08-25 NOTE — Telephone Encounter (Signed)
Please ask who her thyroid Dr was and request most recent note- I just need to rev it first and cannot find in epic-then send this message back to me, thanks

## 2011-08-25 NOTE — Telephone Encounter (Signed)
On 06/23/11 phone note pt called and dr who had filled thyroid med was retiring and asked if Dr Milinda Antis would prescibe synthroid. Please advise. Pt wants rx sent to [facility]. Pt takes Generic Synthroid 50 mcg one tab daily.Pt is almost out of med and wants sent today to [facility]. Pt does not want rx called to local pharmacy.Please advise.

## 2011-08-25 NOTE — Telephone Encounter (Signed)
I called [facility] to Dr Wynelle Link office requesting most recent note to be faxed to Dr Milinda Antis. Pt does not want entire chart faxed to Dr Milinda Antis because she may decide to follow with another endocrinologist but pt has not decided that at this time.  I spoke with Steward Drone in Internal medicine medical records at [facility] and she said pt would have to sign record release. Patient notified as instructed by telephone. Pt said she would go to [facility] and sign record release and have most recent visit faxed to Dr Milinda Antis at [phone].

## 2011-08-25 NOTE — Telephone Encounter (Signed)
Thanks - just the most recent note is fine  I went ahead and sent px elect to [facility] for 90 with no refils

## 2011-08-26 NOTE — Telephone Encounter (Signed)
Patient notified as instructed by telephone that rx was sent to [facility]. Pt is going out of town this morning and when she returns she will sign release form at [facility] to sen  Dr Milinda Antis the most recent OV. Pt said she went to [facility] yesterday after we spoke but the person she needed to talk with was not there and she could get no one that was there to let her sign record release.

## 2012-02-17 ENCOUNTER — Encounter: Payer: Self-pay | Admitting: Family Medicine

## 2012-05-12 ENCOUNTER — Telehealth: Payer: Self-pay | Admitting: Oncology

## 2012-05-12 NOTE — Telephone Encounter (Signed)
lmonvm for pt re appt for 8/5. Schedule mailed today.

## 2012-06-21 ENCOUNTER — Telehealth: Payer: Self-pay | Admitting: Oncology

## 2012-06-21 ENCOUNTER — Encounter: Payer: Self-pay | Admitting: Oncology

## 2012-06-21 ENCOUNTER — Other Ambulatory Visit: Payer: 59 | Admitting: Lab

## 2012-06-21 ENCOUNTER — Other Ambulatory Visit: Payer: Self-pay | Admitting: Oncology

## 2012-06-21 ENCOUNTER — Ambulatory Visit (HOSPITAL_BASED_OUTPATIENT_CLINIC_OR_DEPARTMENT_OTHER): Payer: 59 | Admitting: Oncology

## 2012-06-21 VITALS — BP 115/73 | HR 76 | Temp 98.1°F | Resp 20 | Ht 66.5 in | Wt 145.3 lb

## 2012-06-21 DIAGNOSIS — D72819 Decreased white blood cell count, unspecified: Secondary | ICD-10-CM

## 2012-06-21 DIAGNOSIS — M899 Disorder of bone, unspecified: Secondary | ICD-10-CM

## 2012-06-21 DIAGNOSIS — M949 Disorder of cartilage, unspecified: Secondary | ICD-10-CM

## 2012-06-21 DIAGNOSIS — D708 Other neutropenia: Secondary | ICD-10-CM

## 2012-06-21 DIAGNOSIS — D696 Thrombocytopenia, unspecified: Secondary | ICD-10-CM

## 2012-06-21 LAB — CBC WITH DIFFERENTIAL/PLATELET
BASO%: 1 % (ref 0.0–2.0)
EOS%: 2.4 % (ref 0.0–7.0)
HCT: 34.7 % — ABNORMAL LOW (ref 34.8–46.6)
LYMPH%: 43 % (ref 14.0–49.7)
MCH: 32.7 pg (ref 25.1–34.0)
MCHC: 35.1 g/dL (ref 31.5–36.0)
MONO%: 6.1 % (ref 0.0–14.0)
NEUT%: 47.5 % (ref 38.4–76.8)
Platelets: 139 10*3/uL — ABNORMAL LOW (ref 145–400)
RBC: 3.72 10*6/uL (ref 3.70–5.45)
WBC: 2.5 10*3/uL — ABNORMAL LOW (ref 3.9–10.3)

## 2012-06-21 NOTE — Telephone Encounter (Signed)
gve the pt her aug 2014 appt calendar °

## 2012-06-21 NOTE — Patient Instructions (Addendum)
I will see you back in 1 year. Please continue to follow up with your primary physician

## 2012-06-21 NOTE — Progress Notes (Signed)
OFFICE PROGRESS NOTE  CC  Roxy Manns, MD [address] [place] [address], [place] [address]  DIAGNOSIS: 66 year old female with leukopenia, chronically  PRIOR THERAPY:Observation  CURRENT THERAPY:Observation  INTERVAL HISTORY: Jennifer Sellers Syring 66 y.o. female returns for Followup visit today. She is being seen once a year. Overall she's doing well. She's only had one bronchitic infection back in August 2012. Otherwise her winters in [place] have gone quite well without any infections. She denies any fevers chills night sweats headaches she has no bleeding problems no easy bruising. Patient does tell me that her energy level is a little bit low. She is not exercising. She did have some problems with her right knee and that she has discontinued her exercise program. She has not been seen by Dr. Milinda Antis in quite some time and I have recommended that she going for a complete physical examination. Remainder of the 10 point review of systems is negative.  MEDICAL HISTORY: Past Medical History  Diagnosis Date  . Leukopenia     ALLERGIES:  is allergic to sulfonamide derivatives.  MEDICATIONS:  Current Outpatient Prescriptions  Medication Sig Dispense Refill  . levothyroxine (SYNTHROID, LEVOTHROID) 50 MCG tablet Take 1 tablet (50 mcg total) by mouth daily.  90 tablet  0    SURGICAL HISTORY: History reviewed. No pertinent past surgical history.  REVIEW OF SYSTEMS:  Pertinent items are noted in HPI.   PHYSICAL EXAMINATION:  Gen.: Patient is well-appearing awake alert in no acute distress well developed well nourished female HEENT: EOMI PERRLA sclerae anicteric no conjunctival pallor oral mucosa is moist neck is supple no palpable cervical supraclavicular or axillary adenopathy Lungs: Clear to auscultation and percussion Cardiovascular: Regular rate rhythm Abdomen: Soft nontender nondistended no hepatosplenomegaly no masses Extremities: No clubbing edema or  cyanosis Neuro: Alert oriented otherwise nonfocal Skin: No rashes ECOG PERFORMANCE STATUS: 0 - Asymptomatic  Blood pressure 115/73, pulse 76, temperature 98.1 F (36.7 C), resp. rate 20, height 5' 6.5" (1.689 m), weight 145 lb 4.8 oz (65.908 kg).  LABORATORY DATA: Lab Results  Component Value Date   WBC 2.5* 06/21/2012   HGB 12.2 06/21/2012   HCT 34.7* 06/21/2012   MCV 93.2 06/21/2012   PLT 139* 06/21/2012      Chemistry      Component Value Date/Time   NA 143 08/14/2011 1011   K 3.9 08/14/2011 1011   CL 110 08/14/2011 1011   CO2 28 08/14/2011 1011   BUN 10 08/14/2011 1011   CREATININE 0.8 08/14/2011 1011      Component Value Date/Time   CALCIUM 8.3* 08/14/2011 1011   ALKPHOS 74 08/14/2011 1011   AST 23 08/14/2011 1011   ALT 16 08/14/2011 1011   BILITOT 1.9* 08/14/2011 1011       RADIOGRAPHIC STUDIES:  No results found.  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 Patient has been on observation only. She does have a little bit of thrombocytopenia she does need to have that monitored.   PLAN: I have recommended that patient be seen by Dr. Milinda Antis in about 3-4 months time. At that time I would recommend she have a CBC performed to follow her counts. I will plan on seeing the patient back in one years time.   All questions were answered. The patient knows to call the clinic with any problems, questions or concerns. We can certainly see the patient much sooner if necessary.  I spent 15 minutes  counseling the patient face to face. The total time spent in the appointment was 30 minutes.    Drue Second, MD Medical/Oncology [facility] [phone] (beeper) [phone] (Office)  06/21/2012, 9:43 AM

## 2012-07-02 ENCOUNTER — Encounter: Payer: Self-pay | Admitting: Family Medicine

## 2012-07-02 ENCOUNTER — Ambulatory Visit (INDEPENDENT_AMBULATORY_CARE_PROVIDER_SITE_OTHER): Payer: 59 | Admitting: Family Medicine

## 2012-07-02 VITALS — BP 119/65 | HR 87 | Temp 99.1°F | Ht 67.5 in | Wt 144.2 lb

## 2012-07-02 DIAGNOSIS — J069 Acute upper respiratory infection, unspecified: Secondary | ICD-10-CM

## 2012-07-02 DIAGNOSIS — H109 Unspecified conjunctivitis: Secondary | ICD-10-CM

## 2012-07-02 MED ORDER — NEOMYCIN-POLYMYXIN-HC 3.5-10000-1 OP SUSP: 1.0000 [drp] | Freq: Four times a day (QID) | OPHTHALMIC | Status: DC

## 2012-07-02 NOTE — Progress Notes (Signed)
Subjective:    Patient ID: Jennifer Sellers, female    DOB: 1945-12-17, 66 y.o.   MRN: [id number]  HPI Is here with uri Monday - she was working in her basement -- and inhaled some lysol  By 7 pm - thought she had a reaction to lysol-- some chest congestion and discomfort L eye had a "cold in it" tues- felt some better during the day and worse at night   L eye - gummy with d/c that was yellow  Cough  Last night both eyes - irritated and draining  Crusted shut this am  Now some runny nose and cong  Now coughing up some mucous yellow   Little fever 99.1   Patient Active Problem List  Diagnosis  . NEOPLASMS UNSPEC NATURE BONE SOFT TISSUE&SKIN  . THYROID STIMULATING HORMONE, ABNORMAL  . HYPERLIPIDEMIA  . GOUTY TOPHI  . LEUKOCYTOPENIA UNSPECIFIED  . INSOMNIA, TRANSIENT  . DRY MOUTH  . HIATAL HERNIA  . POSTMENOPAUSAL STATUS  . OTHER SYMPTOMS REFERABLE JOINT SITE UNSPECIFIED  . OSTEOPENIA  . RHEUMATOID FACTOR, POSITIVE  . CARPAL TUNNEL SYNDROME, HX OF   Past Medical History  Diagnosis Date  . Leukopenia    No past surgical history on file. History  Substance Use Topics  . Smoking status: Never Smoker   . Smokeless tobacco: Not on file  . Alcohol Use: No   No family history on file. Allergies  Allergen Reactions  . Sulfonamide Derivatives     REACTION: reaction  not known   Current Outpatient Prescriptions on File Prior to Visit  Medication Sig Dispense Refill  . levothyroxine (SYNTHROID, LEVOTHROID) 50 MCG tablet Take 1 tablet (50 mcg total) by mouth daily.  90 tablet  0      Review of Systems Review of Systems  Constitutional: Negative for appetite change,  and unexpected weight change.  Eyes: Negative for pain and visual disturbance. pos for conj redness and drainage and irritation Respiratory: Negative for sob and wheeze   Cardiovascular: Negative for cp or palpitations    Gastrointestinal: Negative for nausea, diarrhea and constipation.  Genitourinary:  Negative for urgency and frequency.  Skin: Negative for pallor or rash   Neurological: Negative for weakness, light-headedness, numbness and headaches.  Hematological: Negative for adenopathy. Does not bruise/bleed easily.  Psychiatric/Behavioral: Negative for dysphoric mood. The patient is not nervous/anxious.         Objective:   Physical Exam  Constitutional: She appears well-developed and well-nourished. No distress.  HENT:  Head: Normocephalic and atraumatic.  Right Ear: External ear normal.  Left Ear: External ear normal.  Mouth/Throat: Oropharynx is clear and moist.       Nares are injected and congested   No facial tenderness  Eyes: EOM are normal. Pupils are equal, round, and reactive to light. Right eye exhibits discharge. Left eye exhibits discharge. No scleral icterus.       Conj injection bilat with nl vision, some yellow d/c bilat No lid swelling   Neck: Normal range of motion. Neck supple. No JVD present. Carotid bruit is not present. No thyromegaly present.  Cardiovascular: Normal rate, regular rhythm and normal heart sounds.   Pulmonary/Chest: Breath sounds normal. No respiratory distress. She has no wheezes. She has no rales.       Mildly harsh bs at bases   Abdominal: Soft. Bowel sounds are normal. She exhibits no distension, no abdominal bruit and no mass. There is no tenderness.  Musculoskeletal: Normal range of motion.  Lymphadenopathy:  She has no cervical adenopathy.  Neurological: She is alert. She has normal reflexes. No cranial nerve deficit. She exhibits normal muscle tone. Coordination normal.  Skin: Skin is warm and dry. No rash noted. No erythema. No pallor.  Psychiatric: She has a normal mood and affect.          Assessment & Plan:

## 2012-07-02 NOTE — Patient Instructions (Addendum)
Use the cortisporin eye drops until better as directed If problems or side effects let me know  Try zyrtec for runny nose otc  mucinex DM is good for cough and congestion Drink a lot of fluids and rest Update if not starting to improve in a week or if worsening

## 2012-07-02 NOTE — Assessment & Plan Note (Signed)
Along with conjunctivits  Disc symptomatic care - see instructions on AVS  Update if not starting to improve in a week or if worsening

## 2012-07-02 NOTE — Assessment & Plan Note (Signed)
Along with viral uri - but was exp to sick child an is having a lot of eye discharge Will tx with cortisporin drops Will update if any vision change or no imp in several days

## 2012-07-05 ENCOUNTER — Telehealth: Payer: Self-pay | Admitting: Family Medicine

## 2012-07-05 ENCOUNTER — Encounter: Payer: Self-pay | Admitting: Family Medicine

## 2012-07-05 ENCOUNTER — Ambulatory Visit (INDEPENDENT_AMBULATORY_CARE_PROVIDER_SITE_OTHER): Payer: 59 | Admitting: Family Medicine

## 2012-07-05 VITALS — BP 98/67 | HR 82 | Temp 98.2°F | Ht 67.0 in | Wt 145.8 lb

## 2012-07-05 DIAGNOSIS — J069 Acute upper respiratory infection, unspecified: Secondary | ICD-10-CM

## 2012-07-05 DIAGNOSIS — H109 Unspecified conjunctivitis: Secondary | ICD-10-CM

## 2012-07-05 NOTE — Telephone Encounter (Signed)
Triage Record Num: [id number] Operator: April Finney Patient Name: Colin Norment Call Date & Time: 07/04/2012 8:47:35AM Patient Phone: [phone] PCP: Audrie Gallus. Tower Patient Gender: Female PCP Fax : Patient DOB: [birth year] Practice Name: [facility] Reason for Call: Caller: Roshell/Patient; PCP: Roxy Manns A.; CB#: [phone]; Call regarding Treated for Pink Eye last week and the eye drops prescribe are burning her eyes. Note: Patient had Cateract Surgery in both eyes 1-2 yrs ago.; Neomycin eye drops started on 07/02/12 for eye infection and now eye lids are swollen and increased redness. No vision changes. No emergent symptoms. See in 24 hrs care advice given. Protocol(s) Used: Eye: Infection or Irritation Recommended Outcome per Protocol: See Provider within 24 hours Reason for Outcome: Tearing AND tenderness over nasal side of upper or lower lid AND unresponsive to 24 hours of home care Care Advice: ~ Another adult should drive if vision is impaired. Call provider if symptoms get worse, or you develop increasing eye pain, changes in vision, or blisters or sores on eye or insides of eyelids. ~ Avoid touching or rubbing the eyes; wash hands frequently and do not share towels, washcloths or other personal care items with others. ~ ~ SYMPTOM / CONDITION MANAGEMENT ~ INFECTION CONTROL ~ CAUTIONS 07/04/2012 9:23:55AM Page 1 of 1 [id number]

## 2012-07-05 NOTE — Patient Instructions (Addendum)
Keep wiping any discharge from eyes with soft cloth Lubricant drops are ok  Get rid of the corisporin- I think you are allergic to it  (cortisporin) Symptomatic care for the cold Update me if worse or fever or other symptoms

## 2012-07-05 NOTE — Progress Notes (Signed)
Subjective:    Patient ID: Jennifer Sellers, female    DOB: January 27, 1946, 66 y.o.   MRN: [id number]  HPI Here for f/u of conjunctivitis - was seen 8/16 and given cotrisporin- got worse When she put those drops in - made her eyes and lids burn like crazy Has not used since Saturday night  Today is a bit better  R eye is still somewhat irritated - and a bit of drainage   Vision is pretty good overall -no change in that   Still a little chest congestion - some green discharge  Also blowing nose quite a bit No fever Tickle in throat last night and used some cough syrup    Patient Active Problem List  Diagnosis  . NEOPLASMS UNSPEC NATURE BONE SOFT TISSUE&SKIN  . THYROID STIMULATING HORMONE, ABNORMAL  . HYPERLIPIDEMIA  . GOUTY TOPHI  . LEUKOCYTOPENIA UNSPECIFIED  . INSOMNIA, TRANSIENT  . DRY MOUTH  . HIATAL HERNIA  . POSTMENOPAUSAL STATUS  . OTHER SYMPTOMS REFERABLE JOINT SITE UNSPECIFIED  . OSTEOPENIA  . RHEUMATOID FACTOR, POSITIVE  . CARPAL TUNNEL SYNDROME, HX OF  . Conjunctivitis  . Viral URI with cough   Past Medical History  Diagnosis Date  . Leukopenia    No past surgical history on file. History  Substance Use Topics  . Smoking status: Never Smoker   . Smokeless tobacco: Not on file  . Alcohol Use: No   No family history on file. Allergies  Allergen Reactions  . Sulfonamide Derivatives     REACTION: reaction  not known   Current Outpatient Prescriptions on File Prior to Visit  Medication Sig Dispense Refill  . levothyroxine (SYNTHROID, LEVOTHROID) 50 MCG tablet Take 1 tablet (50 mcg total) by mouth daily.  90 tablet  0  . neomycin-polymyxin-hydrocortisone (CORTISPORIN) 3.5-10000-1 ophthalmic suspension Place 1 drop into both eyes every 6 (six) hours. While awake  7.5 mL  0      Review of Systems Review of Systems  Constitutional: Negative for fever, appetite change, fatigue and unexpected weight change.  ENT some nasal and chest congestion neg for sinus  pain  Eyes: Negative for pain and visual disturbance. pos for irritation and discharge that is improved Respiratory: Negative for sob or wheeze/pos for mild prod cough Cardiovascular: Negative for cp or palpitations    Gastrointestinal: Negative for nausea, diarrhea and constipation.  Genitourinary: Negative for urgency and frequency.  Skin: Negative for pallor or rash   Neurological: Negative for weakness, light-headedness, numbness and headaches.  Hematological: Negative for adenopathy. Does not bruise/bleed easily.  Psychiatric/Behavioral: Negative for dysphoric mood. The patient is not nervous/anxious.         Objective:   Physical Exam  Constitutional: She appears well-developed and well-nourished. No distress.  HENT:  Head: Normocephalic and atraumatic.  Right Ear: External ear normal.  Left Ear: External ear normal.  Mouth/Throat: Oropharynx is clear and moist. No oropharyngeal exudate.       Nares are injected and congested  No facial tenderness Throat clear    Eyes: Conjunctivae and EOM are normal. Pupils are equal, round, and reactive to light. Right eye exhibits no discharge. Left eye exhibits no discharge. No scleral icterus.       Conjunctival erythema appears to be resolved One small area of L inner lid is irritated  No rash or lid swelling Nl vision and EOMS  Neck: Normal range of motion. Neck supple.  Cardiovascular: Normal rate and regular rhythm.   Pulmonary/Chest: Effort normal and  breath sounds normal. No respiratory distress. She has no wheezes.  Lymphadenopathy:    She has no cervical adenopathy.  Neurological: She is alert. No cranial nerve deficit.  Skin: Skin is dry. No rash noted. No erythema. No pallor.  Psychiatric: She has a normal mood and affect.          Assessment & Plan:

## 2012-07-05 NOTE — Telephone Encounter (Signed)
Please call to see how she is doing- tell her to stop the eye drops if they are making her worse and update me thanks

## 2012-07-06 NOTE — Telephone Encounter (Signed)
Spoke to patient she is doing well

## 2012-07-09 ENCOUNTER — Encounter: Payer: Self-pay | Admitting: Family Medicine

## 2012-07-09 ENCOUNTER — Ambulatory Visit (INDEPENDENT_AMBULATORY_CARE_PROVIDER_SITE_OTHER): Payer: 59 | Admitting: Family Medicine

## 2012-07-09 ENCOUNTER — Telehealth: Payer: Self-pay | Admitting: Family Medicine

## 2012-07-09 VITALS — BP 102/60 | HR 72 | Temp 98.2°F | Ht 67.0 in | Wt 142.2 lb

## 2012-07-09 DIAGNOSIS — H109 Unspecified conjunctivitis: Secondary | ICD-10-CM

## 2012-07-09 NOTE — Patient Instructions (Addendum)
We will do eye doctor referral at check out  Keep hands clean , wipe off discharge with clean cloth

## 2012-07-09 NOTE — Progress Notes (Signed)
  Subjective:    Patient ID: Jennifer Sellers, female    DOB: 1946/05/08, 66 y.o.   MRN: [id number]  HPI  Here with eyes -still being swollen and red  Was a lot better last time  Still feels very irritated - and also had rxn to cortisporin  Still swollen below eyes   More puffiness under eyes  Not sure about vision change - does not think so   No fever   R eye - having a little clear discharge -no pus or colored discharge  Patient Active Problem List  Diagnosis  . NEOPLASMS UNSPEC NATURE BONE SOFT TISSUE&SKIN  . THYROID STIMULATING HORMONE, ABNORMAL  . HYPERLIPIDEMIA  . GOUTY TOPHI  . LEUKOCYTOPENIA UNSPECIFIED  . INSOMNIA, TRANSIENT  . DRY MOUTH  . HIATAL HERNIA  . POSTMENOPAUSAL STATUS  . OTHER SYMPTOMS REFERABLE JOINT SITE UNSPECIFIED  . OSTEOPENIA  . RHEUMATOID FACTOR, POSITIVE  . CARPAL TUNNEL SYNDROME, HX OF  . Conjunctivitis  . Viral URI with cough   Past Medical History  Diagnosis Date  . Leukopenia    No past surgical history on file. History  Substance Use Topics  . Smoking status: Never Smoker   . Smokeless tobacco: Not on file  . Alcohol Use: No   No family history on file. Allergies  Allergen Reactions  . Cortisporin (Bacitra-Neomycin-Polymyxin-Hc)     Burning / swelling of eyes with eye drops  . Sulfonamide Derivatives     REACTION: reaction  not known   Current Outpatient Prescriptions on File Prior to Visit  Medication Sig Dispense Refill  . levothyroxine (SYNTHROID, LEVOTHROID) 50 MCG tablet Take 1 tablet (50 mcg total) by mouth daily.  90 tablet  0  . neomycin-polymyxin-hydrocortisone (CORTISPORIN) 3.5-10000-1 ophthalmic suspension Place 1 drop into both eyes every 6 (six) hours. While awake  7.5 mL  0       Review of Systems Review of Systems  Constitutional: Negative for fever, appetite change, fatigue and unexpected weight change.  Eyes: Negative for pain and visual disturbance.  Respiratory: Negative for cough and shortness of  breath.   Cardiovascular: Negative for cp or palpitations    Gastrointestinal: Negative for nausea, diarrhea and constipation.  Genitourinary: Negative for urgency and frequency.  Skin: Negative for pallor or rash   Neurological: Negative for weakness, light-headedness, numbness and headaches.  Hematological: Negative for adenopathy. Does not bruise/bleed easily.  Psychiatric/Behavioral: Negative for dysphoric mood. The patient is not nervous/anxious.         Objective:   Physical Exam  Constitutional: She appears well-developed and well-nourished.  HENT:  Head: Normocephalic and atraumatic.  Mouth/Throat: Oropharynx is clear and moist.  Eyes: Conjunctivae and EOM are normal. Pupils are equal, round, and reactive to light.       Some clear watery discharge from both eyes No conj injection or swelling  No lid swelling noted Is puffy under eyes   Neck: Normal range of motion. Neck supple.  Lymphadenopathy:    She has no cervical adenopathy.  Skin: Skin is warm and dry. No rash noted. No erythema.  Psychiatric: She has a normal mood and affect.          Assessment & Plan:

## 2012-07-09 NOTE — Telephone Encounter (Signed)
Caller: Shiesha/Patient; Patient Name: Jennifer Sellers; PCP: Judy Pimple.; Best Callback Phone Number: [phone]. Onset Patient reports upper eyelids swelling. Afebrile.  All emergent symptoms ruled out per Eye: Infection or Irritation protocol with exception "New onset of severe sensitivity to light or glare."  Contacted office due to EPIC link down, spoke with Forest Becker and appointment scheduled for 12:15 PM  07/09/12.

## 2012-07-09 NOTE — Telephone Encounter (Signed)
I saw her in the office

## 2012-09-07 ENCOUNTER — Encounter: Payer: Self-pay | Admitting: Family Medicine

## 2012-09-21 ENCOUNTER — Ambulatory Visit (INDEPENDENT_AMBULATORY_CARE_PROVIDER_SITE_OTHER): Payer: 59 | Admitting: Family Medicine

## 2012-09-21 ENCOUNTER — Encounter: Payer: Self-pay | Admitting: Family Medicine

## 2012-09-21 VITALS — BP 104/68 | HR 64 | Temp 98.3°F | Ht 66.5 in | Wt 143.2 lb

## 2012-09-21 DIAGNOSIS — E039 Hypothyroidism, unspecified: Secondary | ICD-10-CM

## 2012-09-21 DIAGNOSIS — M6530 Trigger finger, unspecified finger: Secondary | ICD-10-CM

## 2012-09-21 DIAGNOSIS — E079 Disorder of thyroid, unspecified: Secondary | ICD-10-CM

## 2012-09-21 DIAGNOSIS — M1A9XX1 Chronic gout, unspecified, with tophus (tophi): Secondary | ICD-10-CM

## 2012-09-21 DIAGNOSIS — E785 Hyperlipidemia, unspecified: Secondary | ICD-10-CM

## 2012-09-21 DIAGNOSIS — M899 Disorder of bone, unspecified: Secondary | ICD-10-CM

## 2012-09-21 DIAGNOSIS — D72819 Decreased white blood cell count, unspecified: Secondary | ICD-10-CM

## 2012-09-21 NOTE — Progress Notes (Signed)
Subjective:    Patient ID: Jennifer Sellers, female    DOB: Mar 28, 1946, 66 y.o.   MRN: [id number]  HPI Here for check up of chronic medical conditions and to review health mt list   Is having problems with her right middle finger - is triggering  Wears a brace on her hand that helps  On Sunday night - her L middle finger was red and she did not know why -not painful  Took aleve and that helped  Is not the joint but the whole finger   She is concerned about her liver  Chiropractor told her her "liver was not happy" due to her diet with too much sugar  Is taking a supplement that the chiropractor gave her  ? In past bilirubin was a little off   Cholesterol was high in the past  Lab Results  Component Value Date   CHOL 222* 08/14/2011   HDL 51.10 08/14/2011   LDLCALC 141* 01/02/2011   LDLDIRECT 152.5 08/14/2011   TRIG 73.0 08/14/2011   CHOLHDL 4 08/14/2011     Wt is stable with good bmi of 22  Colonoscopy 1/09 Had adenomatous polyp  Zoster status   Pneumovax- is unsure about   Flu vaccine- does not get them / does not like vaccines   Mammogram 3/13 Self exam- no lumps on self exam  Had visit to her gyn and had a pap 8/13  Hx of osteopenia - 8/13 dexa - dec at L femoral neck  Ca and D-has not been taking it due to dosing of her synthroid  Not exercising  Went to curves in the past - hurt her knee  Can start walking   Due for labs- will do today   Sees dr Welton Flakes yearly for low wbc Lab Results  Component Value Date   WBC 2.5* 06/21/2012   HGB 12.2 06/21/2012   HCT 34.7* 06/21/2012   MCV 93.2 06/21/2012   PLT 139* 06/21/2012     Hypothyroid- she sees endocrinologist  Still wants tsh done, however  No clinical changes Hypothyroidism  Pt has no clinical changes No change in energy level/ hair or skin/ edema and no tremor  Patient Active Problem List  Diagnosis  . NEOPLASMS UNSPEC NATURE BONE SOFT TISSUE&SKIN  . THYROID STIMULATING HORMONE, ABNORMAL  . HYPERLIPIDEMIA  .  GOUTY TOPHI  . LEUKOCYTOPENIA UNSPECIFIED  . INSOMNIA, TRANSIENT  . DRY MOUTH  . HIATAL HERNIA  . POSTMENOPAUSAL STATUS  . OTHER SYMPTOMS REFERABLE JOINT SITE UNSPECIFIED  . OSTEOPENIA  . RHEUMATOID FACTOR, POSITIVE  . CARPAL TUNNEL SYNDROME, HX OF  . Conjunctivitis  . Viral URI with cough   Past Medical History  Diagnosis Date  . Leukopenia    No past surgical history on file. History  Substance Use Topics  . Smoking status: Never Smoker   . Smokeless tobacco: Not on file  . Alcohol Use: Yes     Comment: occasional   No family history on file. Allergies  Allergen Reactions  . Cortisporin (Bacitra-Neomycin-Polymyxin-Hc)     Burning / swelling of eyes with eye drops  . Sulfonamide Derivatives     REACTION: reaction  not known   Current Outpatient Prescriptions on File Prior to Visit  Medication Sig Dispense Refill  . levothyroxine (SYNTHROID, LEVOTHROID) 50 MCG tablet Take 1 tablet (50 mcg total) by mouth daily.  90 tablet  0          Review of Systems Review of Systems  Constitutional: Negative  for fever, appetite change, fatigue and unexpected weight change.  Eyes: Negative for pain and visual disturbance.  Respiratory: Negative for cough and shortness of breath.   Cardiovascular: Negative for cp or palpitations    Gastrointestinal: Negative for nausea, diarrhea and constipation.  Genitourinary: Negative for urgency and frequency.  Skin: Negative for pallor or rash   MSK pos for aches and pains, also trigger finger , neg for swollen joints  Neurological: Negative for weakness, light-headedness, numbness and headaches.  Hematological: Negative for adenopathy. Does not bruise/bleed easily.  Psychiatric/Behavioral: Negative for dysphoric mood. The patient is not nervous/anxious.         Objective:   Physical Exam  Constitutional: She appears well-developed and well-nourished. No distress.  HENT:  Head: Normocephalic and atraumatic.  Right Ear: External ear  normal.  Left Ear: External ear normal.  Nose: Nose normal.  Mouth/Throat: Oropharynx is clear and moist.  Eyes: Conjunctivae normal and EOM are normal. Pupils are equal, round, and reactive to light. Right eye exhibits no discharge. Left eye exhibits no discharge. No scleral icterus.  Neck: Normal range of motion. Neck supple. No JVD present. Carotid bruit is not present. No thyromegaly present.  Cardiovascular: Normal rate, regular rhythm, normal heart sounds and intact distal pulses.  Exam reveals no gallop.   Pulmonary/Chest: Effort normal and breath sounds normal. No respiratory distress. She has no wheezes.  Abdominal: Soft. Bowel sounds are normal. She exhibits no distension, no abdominal bruit and no mass. There is no tenderness.  Musculoskeletal: Normal range of motion. She exhibits no edema and no tenderness.       R middle finger - is triggering mildly without discomfort   Lymphadenopathy:    She has no cervical adenopathy.  Neurological: She is alert. She has normal reflexes. No cranial nerve deficit. She exhibits normal muscle tone. Coordination normal.  Skin: Skin is warm and dry. No rash noted. No erythema. No pallor.  Psychiatric: She has a normal mood and affect.          Assessment & Plan:

## 2012-09-21 NOTE — Patient Instructions (Addendum)
Labs today Keep wearing the brace on your hand if it is helping trigger finger- it no let me know  Get back on calcium and vitamin d - and space the dosing away from thyroid medicine  Start some regular walking for your bone health also  I think you will need a colonoscopy in 2014  If you change your mind about a flu vaccine or pneumonia vaccine - let me know If you are interested in a shingles/zoster vaccine - call your insurance to check on coverage,( you should not get it within 1 month of other vaccines) , then call us for a prescription  for it to take to a pharmacy that gives the shot , or make a nurse visit to get it here depending on your coverage

## 2012-09-22 LAB — CBC WITH DIFFERENTIAL/PLATELET
Basophils Relative: 0.4 % (ref 0.0–3.0)
Eosinophils Relative: 2.1 % (ref 0.0–5.0)
HCT: 36.5 % (ref 36.0–46.0)
Lymphs Abs: 1.2 10*3/uL (ref 0.7–4.0)
MCV: 94.2 fl (ref 78.0–100.0)
Monocytes Absolute: 0.1 10*3/uL (ref 0.1–1.0)
Neutrophils Relative %: 55 % (ref 43.0–77.0)
RBC: 3.88 Mil/uL (ref 3.87–5.11)
WBC: 3.2 10*3/uL — ABNORMAL LOW (ref 4.5–10.5)

## 2012-09-22 LAB — COMPREHENSIVE METABOLIC PANEL
Albumin: 4 g/dL (ref 3.5–5.2)
Alkaline Phosphatase: 73 U/L (ref 39–117)
BUN: 15 mg/dL (ref 6–23)
CO2: 27 mEq/L (ref 19–32)
GFR: 81.54 mL/min (ref >60.00)
Glucose, Bld: 82 mg/dL (ref 70–99)
Potassium: 4.2 mEq/L (ref 3.5–5.1)

## 2012-09-22 LAB — VITAMIN D 25 HYDROXY (VIT D DEFICIENCY, FRACTURES): Vit D, 25-Hydroxy: 25 ng/mL — ABNORMAL LOW (ref 30–89)

## 2012-09-22 LAB — TSH: TSH: 2.12 u[IU]/mL (ref 0.35–5.50)

## 2012-09-23 LAB — NMR LIPOPROFILE WITH LIPIDS
HDL-C: 58 mg/dL (ref >=40)
LDL Size: 21.6 nm (ref >20.5)
LP-IR Score: 9 (ref <=45)
Small LDL Particle Number: 153 nmol/L (ref <=527)

## 2012-09-23 NOTE — Assessment & Plan Note (Signed)
Reviewed last dexa  Will go back to ca and D but space out from dosing of her thyroid medicine  Disc exercise- will try walking  No fragility fractures- disc safety

## 2012-09-23 NOTE — Assessment & Plan Note (Signed)
Pt sees endocrine Still wants tsh checked today

## 2012-09-23 NOTE — Assessment & Plan Note (Addendum)
Disc goals for lipids and reasons to control them Rev labs with pt from last check  Rev low sat fat diet in detail Lab today   

## 2012-09-23 NOTE — Assessment & Plan Note (Signed)
Pt wants to continue wearing brace which is helpful  Will have her see Dr Patsy Lager if symptoms persist or worsen

## 2012-09-23 NOTE — Assessment & Plan Note (Signed)
Stable on elbows without pain

## 2012-09-24 ENCOUNTER — Encounter: Payer: Self-pay | Admitting: *Deleted

## 2012-10-05 ENCOUNTER — Other Ambulatory Visit: Payer: 59

## 2012-10-11 ENCOUNTER — Encounter: Payer: 59 | Admitting: Family Medicine

## 2012-11-24 ENCOUNTER — Other Ambulatory Visit: Payer: 59

## 2012-12-01 ENCOUNTER — Encounter: Payer: 59 | Admitting: Family Medicine

## 2013-02-03 ENCOUNTER — Ambulatory Visit: Payer: Self-pay | Admitting: Unknown Physician Specialty

## 2013-02-18 ENCOUNTER — Encounter: Payer: Self-pay | Admitting: Family Medicine

## 2013-05-27 ENCOUNTER — Other Ambulatory Visit: Payer: Self-pay | Admitting: *Deleted

## 2013-05-27 MED ORDER — LEVOTHYROXINE SODIUM 50 MCG PO TABS: 50.0000 ug | ORAL_TABLET | Freq: Every day | ORAL | Status: DC

## 2013-06-01 ENCOUNTER — Telehealth: Payer: Self-pay

## 2013-06-01 NOTE — Telephone Encounter (Signed)
[facility] request clarification of drug therapy for Levothyroxine 50 mcg. Cathy pharmacist at [facility] said pt usually gets name brand Synthroid. Has there been a change to generic.Please advise. Use ref # [id number] when return call.

## 2013-06-01 NOTE — Telephone Encounter (Signed)
Please call pt and ask what she prefers and let them know-thanks

## 2013-06-02 ENCOUNTER — Telehealth: Payer: Self-pay | Admitting: *Deleted

## 2013-06-02 NOTE — Telephone Encounter (Signed)
Pt advise me that she does usually gets Synthroid not the generic so I called and Advise [facility] to fill the name brand Synthroid

## 2013-06-03 NOTE — Telephone Encounter (Signed)
Pt called yesterday and wanted to see if Dr. Milinda Antis had received the lab results she had done at [facility] in [place], I didn't see any labs in chart so I called yesterday and requested her labs from [facility]. I didn't receive the labs yesterday so I called back today and requested pt's labs again.  Pt wanted to know if she still needs to go see Dr. Welton Flakes since they did a lot of labs there at [facility], pt wants Dr. Milinda Antis to review labs and let her know if she still needs to see Dr. Welton Flakes since Dr. Milinda Antis is the one who referred her to Dr. Welton Flakes, pt has appt scheduled with them for 06/23/13, I will wait for labs to come

## 2013-06-03 NOTE — Telephone Encounter (Signed)
I rev her labs - wbc is still low but stable and nl platelet count (low nl range)- I feel she still needs to f/u with Dr Park Breed to check in - and will make sure these labs get sent to her

## 2013-06-03 NOTE — Telephone Encounter (Signed)
Pt advised that Dr. Milinda Antis still wants her to be evaluated by Dr. Park Breed and labs faxed to Dr. Santo Held office

## 2013-06-03 NOTE — Telephone Encounter (Addendum)
Labs received and placed in your inbox  See prev. note pt wants to know if she still needs to see Dr. Welton Flakes since they did labs there and pt wanted you to review her labs

## 2013-06-16 ENCOUNTER — Encounter: Payer: Self-pay | Admitting: Family Medicine

## 2013-06-23 ENCOUNTER — Ambulatory Visit (HOSPITAL_BASED_OUTPATIENT_CLINIC_OR_DEPARTMENT_OTHER): Payer: 59 | Admitting: Oncology

## 2013-06-23 ENCOUNTER — Other Ambulatory Visit (HOSPITAL_BASED_OUTPATIENT_CLINIC_OR_DEPARTMENT_OTHER): Payer: 59 | Admitting: Lab

## 2013-06-23 ENCOUNTER — Encounter: Payer: Self-pay | Admitting: Oncology

## 2013-06-23 ENCOUNTER — Telehealth: Payer: Self-pay | Admitting: Oncology

## 2013-06-23 VITALS — BP 126/80 | HR 76 | Temp 98.1°F | Resp 18 | Ht 66.0 in | Wt 145.9 lb

## 2013-06-23 DIAGNOSIS — D72819 Decreased white blood cell count, unspecified: Secondary | ICD-10-CM

## 2013-06-23 DIAGNOSIS — D696 Thrombocytopenia, unspecified: Secondary | ICD-10-CM

## 2013-06-23 LAB — CBC WITH DIFFERENTIAL/PLATELET
Basophils Absolute: 0 10*3/uL (ref 0.0–0.1)
EOS%: 2.4 % (ref 0.0–7.0)
Eosinophils Absolute: 0.1 10*3/uL (ref 0.0–0.5)
HCT: 34.8 % (ref 34.8–46.6)
HGB: 12.4 g/dL (ref 11.6–15.9)
MCH: 32.6 pg (ref 25.1–34.0)
MCV: 91.5 fL (ref 79.5–101.0)
MONO%: 6 % (ref 0.0–14.0)
NEUT#: 1.3 10*3/uL — ABNORMAL LOW (ref 1.5–6.5)
NEUT%: 54.3 % (ref 38.4–76.8)
RDW: 13.4 % (ref 11.2–14.5)
lymph#: 0.9 10*3/uL (ref 0.9–3.3)

## 2013-06-23 NOTE — Patient Instructions (Addendum)
Doing well  I will see you back in May 2015 for follow up

## 2013-06-23 NOTE — Progress Notes (Signed)
OFFICE PROGRESS NOTE  CC  Roxy Manns, MD [address] [place] [address], [place] [address]  DIAGNOSIS: 67 year old female with leukopenia, chronically  PRIOR THERAPY:Observation  CURRENT THERAPY:Observation  INTERVAL HISTORY: Jennifer Sellers 67 y.o. female returns for Followup visit today. She is being seen once a year. Overall she's doing well. She's only had one bronchitic infection back in August 2012. Otherwise her winters in [place] have gone quite well without any infections. She denies any fevers chills night sweats headaches she has no bleeding problems no easy bruising. Patient does tell me that her energy level is a little bit low. She is not exercising. She did have some problems with her right knee and that she has discontinued her exercise program. She has not been seen by Dr. Milinda Antis in quite some time and I have recommended that she going for a complete physical examination. Remainder of the 10 point review of systems is negative.  MEDICAL HISTORY: Past Medical History  Diagnosis Date  . Leukopenia     ALLERGIES:  is allergic to cortisporin and sulfonamide derivatives.  MEDICATIONS:  Current Outpatient Prescriptions  Medication Sig Dispense Refill  . levothyroxine (SYNTHROID, LEVOTHROID) 50 MCG tablet Take 1 tablet (50 mcg total) by mouth daily.  90 tablet  1  . NON FORMULARY 1 tablet. Itis-Care      . OVER THE COUNTER MEDICATION Take by mouth daily. Cod liver oil in liquid form- for Dry eyes.      . NON FORMULARY 1 tablet. Bio Function (liver)      . NON FORMULARY 1 tablet 2 (two) times daily. Osteo-mins (am pill and pm pill)       No current facility-administered medications for this visit.    SURGICAL HISTORY: History reviewed. No pertinent past surgical history.  REVIEW OF SYSTEMS:  Pertinent items are noted in HPI.   PHYSICAL EXAMINATION:  Gen.: Patient is well-appearing awake alert in no acute distress well developed well nourished  female HEENT: EOMI PERRLA sclerae anicteric no conjunctival pallor oral mucosa is moist neck is supple no palpable cervical supraclavicular or axillary adenopathy Lungs: Clear to auscultation and percussion Cardiovascular: Regular rate rhythm Abdomen: Soft nontender nondistended no hepatosplenomegaly no masses Extremities: No clubbing edema or cyanosis Neuro: Alert oriented otherwise nonfocal Skin: No rashes ECOG PERFORMANCE STATUS: 0 - Asymptomatic  Blood pressure 126/80, pulse 76, temperature 98.1 F (36.7 C), temperature source Oral, resp. rate 18, height 5\' 6"  (1.676 m), weight 145 lb 14.4 oz (66.18 kg).  LABORATORY DATA: Lab Results  Component Value Date   WBC 2.5* 06/23/2013   HGB 12.4 06/23/2013   HCT 34.8 06/23/2013   MCV 91.5 06/23/2013   PLT 142* 06/23/2013      Chemistry      Component Value Date/Time   NA 139 09/21/2012 1543   K 4.2 09/21/2012 1543   CL 106 09/21/2012 1543   CO2 27 09/21/2012 1543   BUN 15 09/21/2012 1543   CREATININE 0.8 09/21/2012 1543      Component Value Date/Time   CALCIUM 8.8 09/21/2012 1543   ALKPHOS 73 09/21/2012 1543   AST 25 09/21/2012 1543   ALT 16 09/21/2012 1543   BILITOT 1.7* 09/21/2012 1543       RADIOGRAPHIC STUDIES:  No results found.  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 Patient has been on observation only. She does have a little bit of thrombocytopenia she does need to have  that monitored.   PLAN: I have recommended that patient be seen by Dr. Milinda Antis in about 3-4 months time. At that time I would recommend she have a CBC performed to follow her counts. I will plan on seeing the patient back in May 2015  All questions were answered. The patient knows to call the clinic with any problems, questions or concerns. We can certainly see the patient much sooner if necessary.  I spent 15 minutes counseling the patient face to face. The total time spent in the appointment was  30 minutes.    Drue Second, MD Medical/Oncology [facility] [phone] (beeper) [phone] (Office)  06/23/2013, 9:24 AM

## 2013-07-20 ENCOUNTER — Telehealth: Payer: Self-pay | Admitting: Family Medicine

## 2013-07-20 NOTE — Telephone Encounter (Signed)
Around here I like Dr Elmer Picker for Opthy and Dr Troy Sine (? If he is taking new pts) for liver  However if she wants to go with [facility]- I am less familiar- in that circumstance we refer to [facility] for both of those issues and they pick the doctor based on her problem (I generally trust [facility] very much) Let me know if that is what she wants to do

## 2013-07-20 NOTE — Telephone Encounter (Signed)
Pt left vm stating that her rheumatologist wants to refer her to a liver specialist and an eye specialist.  She said that the information should have been sent to our office and wants to know who Dr. Milinda Antis suggests she be referred to and what her next steps are.  She would like to be seen at [facility].

## 2013-07-21 NOTE — Telephone Encounter (Signed)
Pt notified of Dr. Royden Purl recommendations, pt said she already has an  Eye doctor and wanted to know who she recommends for liver issues, pt will look into Dr. Rossie Muskrat office and decide if she wants to go to [facility] or Dr. Rossie Muskrat office and call us back

## 2013-08-19 ENCOUNTER — Telehealth: Payer: Self-pay | Admitting: *Deleted

## 2013-08-19 NOTE — Telephone Encounter (Signed)
Pt has high bilirubin level - will refer

## 2013-08-19 NOTE — Telephone Encounter (Signed)
Pt left voicemail asking for a referral to a liver specialist, pt said you all had discussed this in the past, pt thought about it and  would like referral done now, please advise

## 2013-08-19 NOTE — Telephone Encounter (Signed)
Pt notified of referral, pt said she did research online and if possible pt wants to go to Dr. Esmond Harps with [facility]

## 2013-08-30 NOTE — Telephone Encounter (Signed)
Referral faxed to [facility] to Dr Robbie Lis office, they will call the patient to schedule.

## 2013-09-23 ENCOUNTER — Other Ambulatory Visit: Payer: Self-pay

## 2013-09-23 ENCOUNTER — Telehealth: Payer: Self-pay

## 2013-09-23 NOTE — Telephone Encounter (Signed)
Please change that - with directions and refill for 6 months-thanks

## 2013-09-23 NOTE — Telephone Encounter (Signed)
Pt said Dr Renae Fickle endocrinologist told pt to take Synthroid name brand 50 mcg taking one daily and 2 tabs on Sundays; pt med list said take one tab daily. Pt has been taking thyroid med as Dr Renae Fickle had instructed; one daily and 2 tabs on Sun. Pt also used to take name brand Synthroid and  Wants to continue taking name brand. Pt request quantity of synthroid changed to # 104.Please advise.

## 2013-09-23 NOTE — Telephone Encounter (Signed)
Pt wants to know exactly what information was sent to Dr Corky Sing GI at [facility]. Pt request cb from [place].

## 2013-09-26 MED ORDER — LEVOTHYROXINE SODIUM 50 MCG PO TABS: ORAL_TABLET | ORAL | Status: DC

## 2013-09-26 NOTE — Telephone Encounter (Signed)
done

## 2013-09-27 NOTE — Telephone Encounter (Signed)
Called patient back to discuss records that were faxed to Dr Corky Sing at [facility].

## 2013-10-17 DIAGNOSIS — Z79899 Other long term (current) drug therapy: Secondary | ICD-10-CM | POA: Insufficient documentation

## 2013-11-11 ENCOUNTER — Ambulatory Visit (INDEPENDENT_AMBULATORY_CARE_PROVIDER_SITE_OTHER): Payer: 59 | Admitting: Family Medicine

## 2013-11-11 ENCOUNTER — Encounter: Payer: Self-pay | Admitting: Family Medicine

## 2013-11-11 VITALS — BP 108/74 | HR 74 | Temp 98.3°F | Ht 66.5 in | Wt 135.5 lb

## 2013-11-11 DIAGNOSIS — E039 Hypothyroidism, unspecified: Secondary | ICD-10-CM

## 2013-11-11 DIAGNOSIS — M899 Disorder of bone, unspecified: Secondary | ICD-10-CM

## 2013-11-11 DIAGNOSIS — E785 Hyperlipidemia, unspecified: Secondary | ICD-10-CM

## 2013-11-11 DIAGNOSIS — D72819 Decreased white blood cell count, unspecified: Secondary | ICD-10-CM

## 2013-11-11 DIAGNOSIS — Z0000 Encounter for general adult medical examination without abnormal findings: Secondary | ICD-10-CM

## 2013-11-11 LAB — CBC WITH DIFFERENTIAL/PLATELET
Basophils Absolute: 0 10*3/uL (ref 0.0–0.1)
Eosinophils Absolute: 0 10*3/uL (ref 0.0–0.7)
HCT: 35.3 % — ABNORMAL LOW (ref 36.0–46.0)
Lymphocytes Relative: 27.9 % (ref 12.0–46.0)
MCHC: 34.2 g/dL (ref 30.0–36.0)
MCV: 93.2 fl (ref 78.0–100.0)
Monocytes Absolute: 0.2 10*3/uL (ref 0.1–1.0)
Neutrophils Relative %: 64.1 % (ref 43.0–77.0)
Platelets: 201 10*3/uL (ref 150.0–400.0)
RDW: 13 % (ref 11.5–14.6)

## 2013-11-11 LAB — LDL CHOLESTEROL, DIRECT: Direct LDL: 163.4 mg/dL

## 2013-11-11 LAB — COMPREHENSIVE METABOLIC PANEL
ALT: 13 U/L (ref 0–35)
AST: 18 U/L (ref 0–37)
Albumin: 4 g/dL (ref 3.5–5.2)
Alkaline Phosphatase: 76 U/L (ref 39–117)
Chloride: 105 mEq/L (ref 96–112)
GFR: 74.36 mL/min (ref >60.00)
Potassium: 4 mEq/L (ref 3.5–5.1)
Total Protein: 6.9 g/dL (ref 6.0–8.3)

## 2013-11-11 LAB — LIPID PANEL
Cholesterol: 223 mg/dL — ABNORMAL HIGH (ref 0–200)
HDL: 54.1 mg/dL (ref >39.00)

## 2013-11-11 LAB — TSH: TSH: 3.62 u[IU]/mL (ref 0.35–5.50)

## 2013-11-11 MED ORDER — LEVOTHYROXINE SODIUM 50 MCG PO TABS: ORAL_TABLET | ORAL | Status: DC

## 2013-11-11 NOTE — Progress Notes (Signed)
Pre-visit discussion using our clinic review tool. No additional management support is needed unless otherwise documented below in the visit note.  

## 2013-11-11 NOTE — Patient Instructions (Signed)
Take care of yourself  Labs today Eat a healthy diet and stay active Keep taking calcium and D for your bones

## 2013-11-11 NOTE — Progress Notes (Signed)
Subjective:    Patient ID: Jennifer Sellers, female    DOB: [birth year], 67 y.o.   MRN: [id number]  HPI I have personally reviewed the Medicare Annual Wellness questionnaire and have noted 1. The patient's medical and social history 2. Their use of alcohol, tobacco or illicit drugs 3. Their current medications and supplements 4. The patient's functional ability including ADL's, fall risks, home safety risks and hearing or visual             impairment. 5. Diet and physical activities 6. Evidence for depression or mood disorders  The patients weight, height, BMI have been recorded in the chart and visual acuity is per eye clinic.  I have made referrals, counseling and provided education to the patient based review of the above and I have provided the pt with a written personalized care plan for preventive services.  Wt is down 10 lb with bmi of 21 She is sensitive to gluten and cut back on wheat products   Is battling rheum arthritis - joints are doing better overall (not sure what plan will be)  Seeing opthy  Will be seeing Dr for a liver test upcoming    See scanned forms.  Routine anticipatory guidance given to patient.  See health maintenance. Flu vaccine - declines  Shingles declines vaccine PNA declines vaccine  Tetanus 5/08  Colonosco 3/14  Breast cancer screening mammogram 3/13 - she will make her own appt with [facility]  Self exam - no lumps  No gyn problems  Advance directive= does not have a living will currently  Cognitive function addressed- see scanned forms- and if abnormal then additional documentation follows. -memory is generally ok , no problems   PMH and SH reviewed  Meds, vitals, and allergies reviewed.   ROS: See HPI.  Otherwise negative.    Due for labs today  She ate this am however -- will still do that   Osteopenia - 10/13 stable osteopenia  No fractures  She takes ca and D (not religious with it)  Saw Dr Park Breed - for her low wbc - watching it    Patient Active Problem List   Diagnosis Date Noted  . Encounter for Medicare annual wellness exam 11/11/2013  . Hyperbilirubinemia 08/19/2013  . Hypothyroid 09/21/2012  . Trigger finger 09/21/2012  . NEOPLASMS UNSPEC NATURE BONE SOFT TISSUE&SKIN 10/23/2010  . 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 OTHER SYMPTOMS REFERABLE JOINT SITE UNSPECIFIED 10/23/2010  . CARPAL TUNNEL SYNDROME, HX OF 04/05/2009  . LEUKOCYTOPENIA UNSPECIFIED 05/02/2008  . DRY MOUTH 04/28/2008  . INSOMNIA, TRANSIENT 04/27/2007  . OSTEOPENIA 04/27/2007  . HYPERLIPIDEMIA 04/23/2007  . HIATAL HERNIA 04/23/2007  . POSTMENOPAUSAL STATUS 04/23/2007  . RHEUMATOID FACTOR, POSITIVE 04/23/2007   Past Medical History  Diagnosis Date  . Leukopenia    No past surgical history on file. History  Substance Use Topics  . Smoking status: Never Smoker   . Smokeless tobacco: Not on file  . Alcohol Use: Yes     Comment: occasional   No family history on file. Allergies  Allergen Reactions  . Cortisporin [Bacitra-Neomycin-Polymyxin-Hc]     Burning / swelling of eyes with eye drops  . Sulfonamide Derivatives     REACTION: reaction  not known   Current Outpatient Prescriptions on File Prior to Visit  Medication Sig Dispense Refill  . NON FORMULARY 1 tablet. Itis-Care      . NON FORMULARY 1 tablet 2 (two) times daily. Osteo-mins (am pill and pm pill)      .  NON FORMULARY 1 tablet. Bio Function (liver)      . OVER THE COUNTER MEDICATION Take by mouth daily. Cod liver oil in liquid form- for Dry eyes.       No current facility-administered medications on file prior to visit.     Review of Systems Review of Systems  Constitutional: Negative for fever, appetite change, fatigue and unexpected weight change.  Eyes: Negative for pain and visual disturbance.  Respiratory: Negative for cough and shortness of breath.   Cardiovascular: Negative for cp or palpitations    Gastrointestinal: Negative for nausea, diarrhea and constipation.   Genitourinary: Negative for urgency and frequency.  Skin: Negative for pallor or rash   MSK pos for joint pain  Neurological: Negative for weakness, light-headedness, numbness and headaches.  Hematological: Negative for adenopathy. Does not bruise/bleed easily.  Psychiatric/Behavioral: Negative for dysphoric mood. The patient is nervous/anxious.         Objective:   Physical Exam  Constitutional: She appears well-developed and well-nourished. No distress.  HENT:  Head: Normocephalic and atraumatic.  Right Ear: External ear normal.  Left Ear: External ear normal.  Mouth/Throat: Oropharynx is clear and moist.  Eyes: Conjunctivae and EOM are normal. Pupils are equal, round, and reactive to light. No scleral icterus.  Neck: Normal range of motion. Neck supple. No JVD present. Carotid bruit is not present. No thyromegaly present.  Cardiovascular: Normal rate, regular rhythm, normal heart sounds and intact distal pulses.  Exam reveals no gallop.   Pulmonary/Chest: Effort normal and breath sounds normal. No respiratory distress. She has no wheezes. She exhibits no tenderness.  Abdominal: Soft. Bowel sounds are normal. She exhibits no distension, no abdominal bruit and no mass. There is no tenderness.  Genitourinary: No breast swelling, tenderness, discharge or bleeding.  Breast exam: No mass, nodules, thickening, tenderness, bulging, retraction, inflamation, nipple discharge or skin changes noted.  No axillary or clavicular LA.  Chaperoned exam.    Musculoskeletal: Normal range of motion. She exhibits no edema and no tenderness.  Lymphadenopathy:    She has no cervical adenopathy.  Neurological: She is alert. She has normal reflexes. No cranial nerve deficit. She exhibits normal muscle tone. Coordination normal.  Skin: Skin is warm and dry. No rash noted. No erythema. No pallor.  Some SKs on back   Psychiatric: She has a normal mood and affect.          Assessment & Plan:

## 2013-11-13 NOTE — Assessment & Plan Note (Signed)
Pt continues f/u with Dr Park Breed No constitutional symptoms

## 2013-11-13 NOTE — Assessment & Plan Note (Signed)
Reviewed health habits including diet and exercise and skin cancer prevention Reviewed appropriate screening tests for age  Also reviewed health mt list, fam hx and immunization status , as well as social and family history   See HPI Lab today 

## 2013-11-13 NOTE — Assessment & Plan Note (Signed)
dexa in 2013 No fragility fx or falls Disc need for calcium/ vitamin D/ wt bearing exercise and bone density test every 2 y to monitor Disc safety/ fracture risk in detail

## 2013-11-13 NOTE — Assessment & Plan Note (Signed)
Sees endocrinology No clinical changes

## 2013-11-13 NOTE — Assessment & Plan Note (Signed)
Lipids today Disc goals for lipids and reasons to control them Rev labs with pt from last time  Rev low sat fat diet in detail

## 2013-11-14 ENCOUNTER — Encounter: Payer: Self-pay | Admitting: *Deleted

## 2014-03-20 ENCOUNTER — Telehealth: Payer: Self-pay | Admitting: *Deleted

## 2014-03-20 NOTE — Telephone Encounter (Signed)
Received appt instructions from Dr. Alvy Bimler.  Called and left a message for pt to return my call so I can reschedule her appt.

## 2014-03-22 ENCOUNTER — Encounter: Payer: Self-pay | Admitting: Family Medicine

## 2014-03-22 ENCOUNTER — Telehealth: Payer: Self-pay | Admitting: *Deleted

## 2014-03-22 NOTE — Telephone Encounter (Signed)
Called pt & informed her that Dr. Humphrey Rolls is out and Mendel Ryder reviewed her care and felt that Dr. Alvy Bimler would be the better physician to f/u with her.  Pt agreed & I confirmed 06/19/14 appt.

## 2014-03-23 ENCOUNTER — Ambulatory Visit: Payer: 59 | Admitting: Oncology

## 2014-03-23 ENCOUNTER — Other Ambulatory Visit: Payer: 59

## 2014-03-27 ENCOUNTER — Encounter: Payer: Self-pay | Admitting: *Deleted

## 2014-06-19 ENCOUNTER — Other Ambulatory Visit (HOSPITAL_BASED_OUTPATIENT_CLINIC_OR_DEPARTMENT_OTHER): Payer: 59

## 2014-06-19 ENCOUNTER — Other Ambulatory Visit: Payer: Self-pay | Admitting: Hematology and Oncology

## 2014-06-19 ENCOUNTER — Ambulatory Visit (HOSPITAL_BASED_OUTPATIENT_CLINIC_OR_DEPARTMENT_OTHER): Payer: 59 | Admitting: Hematology and Oncology

## 2014-06-19 ENCOUNTER — Encounter: Payer: Self-pay | Admitting: Hematology and Oncology

## 2014-06-19 VITALS — BP 115/71 | HR 79 | Temp 98.3°F | Resp 18 | Ht 66.0 in | Wt 138.5 lb

## 2014-06-19 DIAGNOSIS — D72819 Decreased white blood cell count, unspecified: Secondary | ICD-10-CM

## 2014-06-19 DIAGNOSIS — R799 Abnormal finding of blood chemistry, unspecified: Secondary | ICD-10-CM

## 2014-06-19 DIAGNOSIS — D696 Thrombocytopenia, unspecified: Secondary | ICD-10-CM

## 2014-06-19 DIAGNOSIS — M069 Rheumatoid arthritis, unspecified: Secondary | ICD-10-CM | POA: Insufficient documentation

## 2014-06-19 LAB — CBC WITH DIFFERENTIAL/PLATELET
BASO%: 0.4 % (ref 0.0–2.0)
BASOS ABS: 0 10*3/uL (ref 0.0–0.1)
EOS ABS: 0 10*3/uL (ref 0.0–0.5)
EOS%: 1.6 % (ref 0.0–7.0)
HEMATOCRIT: 35.5 % (ref 34.8–46.6)
HEMOGLOBIN: 12.3 g/dL (ref 11.6–15.9)
LYMPH%: 29.1 % (ref 14.0–49.7)
MCH: 31.9 pg (ref 25.1–34.0)
MCHC: 34.6 g/dL (ref 31.5–36.0)
MCV: 92.2 fL (ref 79.5–101.0)
MONO#: 0.2 10*3/uL (ref 0.1–0.9)
MONO%: 7.6 % (ref 0.0–14.0)
NEUT%: 61.3 % (ref 38.4–76.8)
NEUTROS ABS: 1.5 10*3/uL (ref 1.5–6.5)
PLATELETS: 124 10*3/uL — AB (ref 145–400)
RBC: 3.85 10*6/uL (ref 3.70–5.45)
RDW: 13.2 % (ref 11.2–14.5)
WBC: 2.5 10*3/uL — ABNORMAL LOW (ref 3.9–10.3)
lymph#: 0.7 10*3/uL — ABNORMAL LOW (ref 0.9–3.3)
nRBC: 0 % (ref 0–0)

## 2014-06-19 LAB — MORPHOLOGY
PLT EST: DECREASED
RBC COMMENTS: NORMAL

## 2014-06-19 LAB — CHCC SMEAR

## 2014-06-19 NOTE — Assessment & Plan Note (Signed)
Again, I suspect this is due to rheumatoid arthritis. She is not symptomatic. Recommend observation only.

## 2014-06-19 NOTE — Assessment & Plan Note (Signed)
She does not complain of significant debility from arthritis. I recommend she consult with her rheumatologist for treatment options.

## 2014-06-19 NOTE — Assessment & Plan Note (Signed)
I suspect this is due to untreated her rheumatoid arthritis. She is not symptomatic.

## 2014-06-19 NOTE — Progress Notes (Signed)
[place] FOLLOW-UP progress notes  Patient Care Team: Abner Greenspan, MD as PCP - General  CHIEF COMPLAINTS/PURPOSE OF VISIT:  Chronic leukopenia  HISTORY OF PRESENTING ILLNESS:  Jennifer Sellers 68 y.o. female was transferred to my care after her prior physician has left.  I reviewed the patient's records extensive and collaborated the history with the patient. Summary of her history is as follows: This patient was referred here because of chronic mild leukopenia. She has diagnosis of rheumatoid arthritis not on treatment. She has strong family history of lymphoma and she thought she is being followed here because of concern of possible diagnosis of lymphoma. Apart from bronchitis requiring antibiotics 3 years ago, she denies recurrent infection. She has not need antibiotics recently. Denies atypical infection such as shingles. She is also noted a mild chronic thrombocytopenia but denies any bruising or bleeding. With rheumatoid arthritis, she declined treatment because of concern for lymphoma. She have arthritis affecting her fingers and joints but it is not debilitating.  MEDICAL HISTORY:  Past Medical History  Diagnosis Date  . Leukopenia   . Arthritis     rheumatoid arthritis  . Thyroid disease     SURGICAL HISTORY: Past Surgical History  Procedure Laterality Date  . Carpal tunnel release    . Abdominal hysterectomy      SOCIAL HISTORY: History   Social History  . Marital Status: Married    Spouse Name: N/A    Number of Children: N/A  . Years of Education: N/A   Occupational History  . Not on file.   Social History Main Topics  . Smoking status: Never Smoker   . Smokeless tobacco: Never Used  . Alcohol Use: Yes     Comment: occasional  . Drug Use: No  . Sexual Activity: Yes   Other Topics Concern  . Not on file   Social History Narrative  . No narrative on file    FAMILY HISTORY: Family History  Problem Relation Age of Onset  .  Cancer Mother     lymphoma  . Cancer Brother     lymphoma  . Cancer Daughter     NHL    ALLERGIES:  is allergic to cortisporin and sulfonamide derivatives.  MEDICATIONS:  Current Outpatient Prescriptions  Medication Sig Dispense Refill  . levothyroxine (SYNTHROID, LEVOTHROID) 50 MCG tablet Take 1 tablet daily and take 2 tablets on Sunday  104 tablet  3  . NON FORMULARY 3 tablets. Itis-Care      . NON FORMULARY 1 tablet 2 (two) times daily. Osteo-mins (am pill and pm pill)      . NON FORMULARY Take 1 tablet by mouth 3 (three) times daily. gastroDigeste II      . NON FORMULARY 2 drops daily. Vista  2      . VITAMIN D, CHOLECALCIFEROL, PO Take 5,000 Units by mouth daily.       No current facility-administered medications for this visit.    REVIEW OF SYSTEMS:   Constitutional: Denies fevers, chills or abnormal night sweats Eyes: Denies blurriness of vision, double vision or watery eyes Ears, nose, mouth, throat, and face: Denies mucositis or sore throat Respiratory: Denies cough, dyspnea or wheezes Cardiovascular: Denies palpitation, chest discomfort or lower extremity swelling Gastrointestinal:  Denies nausea, heartburn or change in bowel habits Skin: Denies abnormal skin rashes Lymphatics: Denies new lymphadenopathy or easy bruising Neurological:Denies numbness, tingling or new weaknesses Behavioral/Psych: Mood is stable, no new changes  All other systems were  reviewed with the patient and are negative.  PHYSICAL EXAMINATION: ECOG PERFORMANCE STATUS: 0 - Asymptomatic  Filed Vitals:   06/19/14 1025  BP: 115/71  Pulse: 79  Temp: 98.3 F (36.8 C)  Resp: 18   Filed Weights   06/19/14 1025  Weight: 138 lb 8 oz (62.823 kg)    GENERAL:alert, no distress and comfortable SKIN: skin color, texture, turgor are normal, no rashes or significant lesions EYES: normal, conjunctiva are pink and non-injected, sclera clear OROPHARYNX:no exudate, normal lips, buccal mucosa, and  tongue  NECK: supple, thyroid normal size, non-tender, without nodularity LYMPH:  no palpable lymphadenopathy in the cervical, axillary or inguinal LUNGS: clear to auscultation and percussion with normal breathing effort HEART: regular rate & rhythm and no murmurs without lower extremity edema ABDOMEN:abdomen soft, non-tender and normal bowel sounds Musculoskeletal:no cyanosis of digits and no clubbing. Mild degenerative joint changes were noted. PSYCH: alert & oriented x 3 with fluent speech NEURO: no focal motor/sensory deficits  LABORATORY DATA:  I have reviewed the data as listed Lab Results  Component Value Date   WBC 2.5* 06/19/2014   HGB 12.3 06/19/2014   HCT 35.5 06/19/2014   MCV 92.2 06/19/2014   PLT 124* 06/19/2014    Recent Labs  11/11/13 1009  NA 139  K 4.0  CL 105  CO2 28  GLUCOSE 89  BUN 12  CREATININE 0.8  CALCIUM 9.2  PROT 6.9  ALBUMIN 4.0  AST 18  ALT 13  ALKPHOS 76  BILITOT 1.3*    ASSESSMENT & PLAN:  LEUKOCYTOPENIA UNSPECIFIED I suspect this is due to untreated her rheumatoid arthritis. She is not symptomatic.  Thrombocytopenia, unspecified Again, I suspect this is due to rheumatoid arthritis. She is not symptomatic. Recommend observation only.  Rheumatoid arthritis She does not complain of significant debility from arthritis. I recommend she consult with her rheumatologist for treatment options.  I spent almost half an hour all on counseling and reassuring the patient. I recommend close followup with yearly blood work with her primary care provider. As [place] as her white blood cell count remained stable and the patient is not symptomatic, she does not need to return here as her CBC will never normalized in the presence of active, untreated rheumatoid arthritis.  All questions were answered. The patient knows to call the clinic with any problems, questions or concerns. I spent 25 minutes counseling the patient face to face. The total time spent in  the appointment was 30 minutes and more than 50% was on counseling.     [facility], Blimie Vaness, MD 06/19/2014 12:32 PM

## 2014-06-28 ENCOUNTER — Encounter: Payer: Self-pay | Admitting: Podiatry

## 2014-06-28 ENCOUNTER — Ambulatory Visit (INDEPENDENT_AMBULATORY_CARE_PROVIDER_SITE_OTHER): Payer: Medicare Other | Admitting: Podiatry

## 2014-06-28 VITALS — BP 114/81 | HR 81 | Resp 16 | Ht 67.0 in | Wt 138.0 lb

## 2014-06-28 DIAGNOSIS — M778 Other enthesopathies, not elsewhere classified: Secondary | ICD-10-CM

## 2014-06-28 DIAGNOSIS — M2040 Other hammer toe(s) (acquired), unspecified foot: Secondary | ICD-10-CM

## 2014-06-28 DIAGNOSIS — M7750 Other enthesopathy of unspecified foot: Secondary | ICD-10-CM

## 2014-06-28 DIAGNOSIS — M19079 Primary osteoarthritis, unspecified ankle and foot: Secondary | ICD-10-CM

## 2014-06-28 DIAGNOSIS — M779 Enthesopathy, unspecified: Secondary | ICD-10-CM

## 2014-06-28 NOTE — Progress Notes (Signed)
   Subjective:    Patient ID: Jennifer Sellers, female    DOB: 12-30-1945, 68 y.o.   MRN: [id number]  HPI Comments: i have arthritis in both of my feet. Ive had this for it least 2 years. My feet are the same. i wear tennis shoes all the time. i went to the rheumatologist and they took x-rays and that's it. The right foot is worse.      About 5 years ago i had a nail infection in my big toes. They are fine now. No problems now. i want him to look at them. i went to [facility] clinic and they gave me a medication. I dont know the name of it.  Foot Pain      Review of Systems     Objective:   Physical Exam: I have reviewed her past medical history medications allergies surgeries social history and review of systems. I also reviewed a paper set of x-rays today because she refused standard radiographic evaluation. However she is requesting that I evaluated her for arthritis which is very difficult without adequate radiographic evaluations. Pulses are strongly palpable bilateral. Neurologic sensorium is intact per since once the monofilament. Deep tendon reflexes are intact bilateral. Muscle strength is 5 over 5 dorsiflexors plantar flexors inverters everters all intrinsic musculature is intact. Orthopedic evaluation demonstrates all joints distal to the ankle a full range of motion without crepitation. This is with the exception of the second metatarsophalangeal joint in toes #2 #3 of the right foot area these toes are rigid at the PIPJ resulting in a retrograde pressure of the second metatarsal. She has pain on end range of motion of the second metatarsophalangeal joint consistent with capsulitis.        Assessment & Plan:  Assessment: Osteoarthritis bilateral right greater than left. Capsulitis second metatarsophalangeal joint of the right foot. Hammertoe deformity second right. 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  Plan: Discussed etiology pathology  conservative versus surgical therapies. At this point because tennis shoes feel much better to her that any other shoe gear I suggested that she continue to wear tennis shoes a regular basis. With arthritis is progressive as this I suggested that she wear them all the time. She does not want to take the medication that her rheumatologist have provided her. I will followup with her on an as-needed basis for her podiatric needs.

## 2014-08-23 ENCOUNTER — Telehealth: Payer: Self-pay | Admitting: Family Medicine

## 2014-08-23 ENCOUNTER — Ambulatory Visit (INDEPENDENT_AMBULATORY_CARE_PROVIDER_SITE_OTHER): Payer: 59 | Admitting: Family Medicine

## 2014-08-23 ENCOUNTER — Encounter: Payer: Self-pay | Admitting: Family Medicine

## 2014-08-23 VITALS — BP 98/66 | HR 63 | Temp 98.8°F | Ht 66.5 in | Wt 136.0 lb

## 2014-08-23 DIAGNOSIS — R6889 Other general symptoms and signs: Secondary | ICD-10-CM | POA: Insufficient documentation

## 2014-08-23 DIAGNOSIS — R0789 Other chest pain: Secondary | ICD-10-CM | POA: Insufficient documentation

## 2014-08-23 DIAGNOSIS — R1013 Epigastric pain: Secondary | ICD-10-CM | POA: Insufficient documentation

## 2014-08-23 NOTE — Telephone Encounter (Signed)
Will see her then 

## 2014-08-23 NOTE — Patient Instructions (Signed)
EKG is reassuring today  I suspect symptoms last night were related to large meal/alcohol/ and anxiety  If symptoms return-let me know , if severe-go to the ER  You may want to have tums on hand for occasional indigestion  Since you have palpable pulses in feet and you do not have leg pain with walking - I am not worried about peripheral vascular disease at this time but we need to keep an eye on it   Keep working on healthy diet and exercise

## 2014-08-23 NOTE — Telephone Encounter (Signed)
Patient Information:  Caller Name: Christelle  Phone: [phone]  Patient: [place], [place]  Gender: Female  DOB: 05-01-1946  Age: 68 Years  PCP: Loura Pardon [facility])  Office Follow Up:  Does the office need to follow up with this patient?: No  Instructions For The Office: N/A   Symptoms  Reason For Call & Symptoms: Calling about having a late supper last night and had indigestion and burping afterwards. She woke up at 0230 with heavy feeling in chest and skin felt clammy. She sat up  in chair and episode passed within a few minuets and she was able to go back to sleep for the rest of the night. No sx today of heartburn and no chest pain.   Reviewed Health History In EMR: Yes  Reviewed Medications In EMR: Yes  Reviewed Allergies In EMR: Yes  Reviewed Surgeries / Procedures: Yes  Date of Onset of Symptoms: 08/23/2014  Guideline(s) Used:  Chest Pain  Disposition Per Guideline:   See Today in Office  Reason For Disposition Reached:   All other patients with chest pain  Advice Given:  Fleeting Chest Pain:  Fleeting chest pains that last only a few seconds and then go away are generally not serious. They may be from pinched muscles or nerves in your chest wall.  Chest Pain Only When Coughing:  Chest pains that occur with coughing generally come from the chest wall and from irritation of the airways. They are usually not serious.  Expected Course:  These mild chest pains usually disappear within 3 days.  Call Back If:  Severe chest pain  Constant chest pain lasting longer than 5 minutes  Difficulty breathing  Fever  You become worse.  Patient Will Follow Care Advice:  YES  Appointment Scheduled:  08/23/2014 12:15:00 Appointment Scheduled Provider:  Loura Pardon [facility])

## 2014-08-23 NOTE — Progress Notes (Signed)
Subjective:    Patient ID: Jennifer Sellers, female    DOB: [birth year], 68 y.o.   MRN: [id number]  HPI Here for an episode of chest pressure last night  After eating a large meal out and dessert and a glass of wine  Had a lot of burping  Thought she may get some reflux last night  Did not take any medicines for it  Woke up at 2:30 am with pressure at the center of her chest - lasting less than 5 minutes  No burning in the throat or regurgitation  Sat up in a recliner - may have fallen back to sleep  May have felt a bit clammy  Then got back into bed and got up at 8 feeling ok   Has been stressed - worried about vascular screen from yesterday  Had a vasc screen - ABI R .78 and L .84   No leg or calf pain  But occ gets cramps at night in ankles   She is trying to eat a healthier diet to lower cholesterol    Patient Active Problem List   Diagnosis Date Noted  . Rheumatoid arthritis 06/19/2014  . Thrombocytopenia, unspecified 06/19/2014  . Encounter for Medicare annual wellness exam 11/11/2013  . Hyperbilirubinemia 08/19/2013  . Hypothyroid 09/21/2012  . Trigger finger 09/21/2012  . NEOPLASMS UNSPEC NATURE BONE SOFT TISSUE&SKIN 10/23/2010  . 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 OTHER SYMPTOMS REFERABLE JOINT SITE UNSPECIFIED 10/23/2010  . CARPAL TUNNEL SYNDROME, HX OF 04/05/2009  . LEUKOCYTOPENIA UNSPECIFIED 05/02/2008  . DRY MOUTH 04/28/2008  . INSOMNIA, TRANSIENT 04/27/2007  . OSTEOPENIA 04/27/2007  . HYPERLIPIDEMIA 04/23/2007  . HIATAL HERNIA 04/23/2007  . POSTMENOPAUSAL STATUS 04/23/2007  . RHEUMATOID FACTOR, POSITIVE 04/23/2007   Past Medical History  Diagnosis Date  . Leukopenia   . Arthritis     rheumatoid arthritis  . Thyroid disease    Past Surgical History  Procedure Laterality Date  . Carpal tunnel release    . Abdominal hysterectomy     History  Substance Use Topics  . Smoking status: Never Smoker   . Smokeless tobacco: Never Used  . Alcohol Use: Yes   Comment: occasional   Family History  Problem Relation Age of Onset  . Cancer Mother     lymphoma  . Cancer Brother     lymphoma  . Cancer Daughter     NHL   Allergies  Allergen Reactions  . Cortisporin [Bacitra-Neomycin-Polymyxin-Hc]     Burning / swelling of eyes with eye drops  . Sulfonamide Derivatives     REACTION: reaction  not known   Current Outpatient Prescriptions on File Prior to Visit  Medication Sig Dispense Refill  . levothyroxine (SYNTHROID, LEVOTHROID) 50 MCG tablet Take 1 tablet daily and take 2 tablets on Sunday  104 tablet  3  . NON FORMULARY 3 tablets. Itis-Care      . NON FORMULARY 1 tablet 2 (two) times daily. Osteo-mins (am pill and pm pill)      . NON FORMULARY Take 1 tablet by mouth 3 (three) times daily. gastroDigeste II      . NON FORMULARY 2 drops daily. Vista  2      . Thiamine HCl (VITAMIN B-1 PO) Take by mouth daily.      Marland Kitchen VITAMIN D, CHOLECALCIFEROL, PO Take 5,000 Units by mouth daily.       No current facility-administered medications on file prior to visit.     Review of Systems Review of  Systems  Constitutional: Negative for fever, appetite change, fatigue and unexpected weight change.  Eyes: Negative for pain and visual disturbance.  Respiratory: Negative for cough and shortness of breath.   Cardiovascular: Negative for cp or palpitations   (had chest discomfort last night-now resolved), neg for PND /orthopnea/pedal edema or pleuritic symptoms , neg for claudication symptoms  Gastrointestinal: Negative for nausea, diarrhea and constipation. neg for heartburn/ pos for belching  Genitourinary: Negative for urgency and frequency.  Skin: Negative for pallor or rash   Neurological: Negative for weakness, light-headedness, numbness and headaches.  Hematological: Negative for adenopathy. Does not bruise/bleed easily.  Psychiatric/Behavioral: Negative for dysphoric mood. The patient is  nervous/anxious.         Objective:   Physical Exam    Constitutional: She appears well-developed and well-nourished. No distress.  Slim and well appearing  HENT:  Head: Normocephalic and atraumatic.  Nose: Nose normal.  Mouth/Throat: Oropharynx is clear and moist.  Eyes: Conjunctivae and EOM are normal. Pupils are equal, round, and reactive to light. Right eye exhibits no discharge. Left eye exhibits no discharge. No scleral icterus.  Neck: Normal range of motion. Neck supple. No JVD present. No thyromegaly present.  Cardiovascular: Normal rate, regular rhythm, normal heart sounds and intact distal pulses.  Exam reveals no gallop.   Pulmonary/Chest: Effort normal and breath sounds normal. No respiratory distress. She has no wheezes. She has no rales.  Abdominal: Soft. Bowel sounds are normal. She exhibits no distension and no mass. There is no tenderness. There is no rebound and no guarding.  Musculoskeletal: She exhibits no edema and no tenderness.  No palp cords or tenderness in legs   Lymphadenopathy:    She has no cervical adenopathy.  Neurological: She is alert. She has normal reflexes. No cranial nerve deficit. She exhibits normal muscle tone. Coordination normal.  Skin: Skin is warm and dry. No rash noted. No erythema. No pallor.  Psychiatric: Her speech is normal and behavior is normal. Thought content normal. Her mood appears anxious. Her affect is not blunt, not labile and not inappropriate. She does not exhibit a depressed mood.  Generally anxious           Assessment & Plan:   Problem List Items Addressed This Visit     Other   Chest pressure - Primary     With reassuring EKG  One episode after large meal and wine at night when anxious  Suspect this was reflux but will continue to monitor Disc s/s angina to watch for     Relevant Orders      EKG 12-Lead (Completed)   Dyspepsia     After pt ate large meal and drank wine before bed (unusual for her) Also anxiety  With some burping and chest discomfort  Disc use of  tums/antacid  if needed for isolated events / and if it re occurs to alert me  Disc avoiding meals before bed and the role etoh plays in reflux   Will continue to monitor      Abnormal ankle brachial index     Rev her screening  On exam pedal pulses are palpable and feet are warm No claudication symptoms Will watch this  Disc s/s of PAD Disc imp of keeping cholesterol and bp controlled as well

## 2014-08-23 NOTE — Progress Notes (Signed)
Pre visit review using our clinic review tool, if applicable. No additional management support is needed unless otherwise documented below in the visit note. 

## 2014-08-24 NOTE — Assessment & Plan Note (Addendum)
After pt ate large meal and drank wine before bed (unusual for her) Also anxiety  With some burping and chest discomfort  Disc use of tums/antacid  if needed for isolated events / and if it re occurs to alert me  Disc avoiding meals before bed and the role etoh plays in reflux   Will continue to monitor

## 2014-08-24 NOTE — Assessment & Plan Note (Addendum)
With reassuring EKG   (NSR rate 64 with RSR in V1) One episode after large meal and wine at night when anxious  Suspect this was reflux but will continue to monitor Disc s/s angina to watch for

## 2014-08-24 NOTE — Assessment & Plan Note (Signed)
Rev her screening  On exam pedal pulses are palpable and feet are warm No claudication symptoms Will watch this  Disc s/s of PAD Disc imp of keeping cholesterol and bp controlled as well

## 2014-10-06 ENCOUNTER — Other Ambulatory Visit: Payer: Self-pay

## 2014-10-06 MED ORDER — LEVOTHYROXINE SODIUM 50 MCG PO TABS: ORAL_TABLET | ORAL | Status: DC

## 2014-10-06 NOTE — Telephone Encounter (Signed)
Pt left v/m [facility];pt has CPX scheduled 11/13/14. Pt notified done.

## 2014-10-11 ENCOUNTER — Other Ambulatory Visit: Payer: Self-pay | Admitting: *Deleted

## 2014-10-11 NOTE — Telephone Encounter (Signed)
Opened in error

## 2014-11-13 ENCOUNTER — Ambulatory Visit (INDEPENDENT_AMBULATORY_CARE_PROVIDER_SITE_OTHER): Payer: 59 | Admitting: Family Medicine

## 2014-11-13 ENCOUNTER — Encounter: Payer: Self-pay | Admitting: Family Medicine

## 2014-11-13 VITALS — BP 100/64 | HR 73 | Temp 98.4°F | Ht 67.0 in | Wt 137.5 lb

## 2014-11-13 DIAGNOSIS — E785 Hyperlipidemia, unspecified: Secondary | ICD-10-CM

## 2014-11-13 DIAGNOSIS — D696 Thrombocytopenia, unspecified: Secondary | ICD-10-CM

## 2014-11-13 DIAGNOSIS — Z0000 Encounter for general adult medical examination without abnormal findings: Secondary | ICD-10-CM

## 2014-11-13 DIAGNOSIS — M8580 Other specified disorders of bone density and structure, unspecified site: Secondary | ICD-10-CM

## 2014-11-13 DIAGNOSIS — E039 Hypothyroidism, unspecified: Secondary | ICD-10-CM

## 2014-11-13 LAB — COMPREHENSIVE METABOLIC PANEL
ALBUMIN: 3.7 g/dL (ref 3.5–5.2)
ALK PHOS: 62 U/L (ref 39–117)
ALT: 11 U/L (ref 0–35)
AST: 18 U/L (ref 0–37)
BUN: 17 mg/dL (ref 6–23)
CHLORIDE: 109 meq/L (ref 96–112)
CO2: 30 mEq/L (ref 19–32)
Calcium: 8.8 mg/dL (ref 8.4–10.5)
Creatinine, Ser: 0.9 mg/dL (ref 0.4–1.2)
GFR: 66.51 mL/min (ref >60.00)
Glucose, Bld: 84 mg/dL (ref 70–99)
POTASSIUM: 4 meq/L (ref 3.5–5.1)
SODIUM: 142 meq/L (ref 135–145)
Total Bilirubin: 1.3 mg/dL — ABNORMAL HIGH (ref 0.2–1.2)
Total Protein: 6.7 g/dL (ref 6.0–8.3)

## 2014-11-13 LAB — CBC WITH DIFFERENTIAL/PLATELET
BASOS ABS: 0 10*3/uL (ref 0.0–0.1)
Basophils Relative: 0.7 % (ref 0.0–3.0)
Eosinophils Absolute: 0.1 10*3/uL (ref 0.0–0.7)
Eosinophils Relative: 3.1 % (ref 0.0–5.0)
HEMATOCRIT: 36.1 % (ref 36.0–46.0)
Hemoglobin: 12.1 g/dL (ref 12.0–15.0)
Lymphocytes Relative: 37.7 % (ref 12.0–46.0)
Lymphs Abs: 1.1 10*3/uL (ref 0.7–4.0)
MCHC: 33.6 g/dL (ref 30.0–36.0)
MCV: 93.7 fl (ref 78.0–100.0)
MONOS PCT: 6 % (ref 3.0–12.0)
Monocytes Absolute: 0.2 10*3/uL (ref 0.1–1.0)
Neutro Abs: 1.5 10*3/uL (ref 1.4–7.7)
Neutrophils Relative %: 52.5 % (ref 43.0–77.0)
PLATELETS: 173 10*3/uL (ref 150.0–400.0)
RBC: 3.85 Mil/uL — ABNORMAL LOW (ref 3.87–5.11)
RDW: 13.1 % (ref 11.5–15.5)
WBC: 2.9 10*3/uL — ABNORMAL LOW (ref 4.0–10.5)

## 2014-11-13 LAB — VITAMIN D 25 HYDROXY (VIT D DEFICIENCY, FRACTURES): VITD: 29.4 ng/mL — ABNORMAL LOW (ref 30.00–100.00)

## 2014-11-13 LAB — LIPID PANEL
CHOLESTEROL: 233 mg/dL — AB (ref 0–200)
HDL: 65.3 mg/dL (ref >39.00)
LDL Cholesterol: 152 mg/dL — ABNORMAL HIGH (ref 0–99)
NONHDL: 167.7
Total CHOL/HDL Ratio: 4
Triglycerides: 80 mg/dL (ref 0.0–149.0)
VLDL: 16 mg/dL (ref 0.0–40.0)

## 2014-11-13 LAB — TSH: TSH: 5.12 u[IU]/mL — AB (ref 0.35–4.50)

## 2014-11-13 MED ORDER — LEVOTHYROXINE SODIUM 50 MCG PO TABS: ORAL_TABLET | ORAL | Status: DC

## 2014-11-13 NOTE — Patient Instructions (Signed)
Take a look at the advanced directive paperwork - to work on a living will and power of attorney  Labs today  You are due for your mammogram in May - you can schedule that yourself and call us for bone density referral at that time  Stay active and eat a health diet

## 2014-11-13 NOTE — Progress Notes (Signed)
Subjective:    Patient ID: Jennifer Sellers, female    DOB: 07/01/1946, 68 y.o.   MRN: [id number]  HPI Here for medicare wellness visit as well as acute and chronic medical problems   Wt is up 1 lb  bmi is 21  Due for labs   She is doing ok overall   Went for eye exam in Dec - and all was stable   I have personally reviewed the Medicare Annual Wellness questionnaire and have noted 1. The patient's medical and social history 2. Their use of alcohol, tobacco or illicit drugs 3. Their current medications and supplements 4. The patient's functional ability including ADL's, fall risks, home safety risks and hearing or visual             impairment. 5. Diet and physical activities 6. Evidence for depression or mood disorders  The patients weight, height, BMI have been recorded in the chart and visual acuity is per eye clinic.  I have made referrals, counseling and provided education to the patient based review of the above and I have provided the pt with a written personalized care plan for preventive services.  See scanned forms.  Routine anticipatory guidance given to patient.  See health maintenance. Colon cancer screening- 3/14 with a 5 year recall - polyps  Breast cancer screening 5/15 up to date  Self breast exam-no .lumps  No gyn problems  Flu vaccine declines  Tetanus vaccine  5/08 Pneumovax declines  Zoster vaccine declines   Advance directive- does not have one - given packet today  Cognitive function addressed- see scanned forms- and if abnormal then additional documentation follows.  occ misplaces or forgets things/overall pretty good   PMH and SH reviewed  Meds, vitals, and allergies reviewed.   ROS: See HPI.  Otherwise negative.    Wants to do labs today - will do that today   Hx of hypothyroidism -no clinical changes   Hx of osteopenia  dexa 2013 - due for that - wants to do in May with her mammogram No broken bones or falls   Hx of low leukocytes and  platelets due to RA Has seen hematology   Patient Active Problem List   Diagnosis Date Noted  . Chest pressure 08/23/2014  . Dyspepsia 08/23/2014  . Abnormal ankle brachial index 08/23/2014  . Rheumatoid arthritis 06/19/2014  . Thrombocytopenia, unspecified 06/19/2014  . Encounter for Medicare annual wellness exam 11/11/2013  . Hyperbilirubinemia 08/19/2013  . Hypothyroid 09/21/2012  . Trigger finger 09/21/2012  . NEOPLASMS UNSPEC NATURE BONE SOFT TISSUE&SKIN 10/23/2010  . 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 OTHER SYMPTOMS REFERABLE JOINT SITE UNSPECIFIED 10/23/2010  . CARPAL TUNNEL SYNDROME, HX OF 04/05/2009  . LEUKOCYTOPENIA UNSPECIFIED 05/02/2008  . DRY MOUTH 04/28/2008  . INSOMNIA, TRANSIENT 04/27/2007  . OSTEOPENIA 04/27/2007  . HYPERLIPIDEMIA 04/23/2007  . HIATAL HERNIA 04/23/2007  . POSTMENOPAUSAL STATUS 04/23/2007  . RHEUMATOID FACTOR, POSITIVE 04/23/2007   Past Medical History  Diagnosis Date  . Leukopenia   . Arthritis     rheumatoid arthritis  . Thyroid disease    Past Surgical History  Procedure Laterality Date  . Carpal tunnel release    . Abdominal hysterectomy     History  Substance Use Topics  . Smoking status: Never Smoker   . Smokeless tobacco: Never Used  . Alcohol Use: Yes     Comment: occasional   Family History  Problem Relation Age of Onset  . Cancer Mother  lymphoma  . Cancer Brother     lymphoma  . Cancer Daughter     NHL   Allergies  Allergen Reactions  . Cortisporin [Bacitra-Neomycin-Polymyxin-Hc]     Burning / swelling of eyes with eye drops  . Sulfonamide Derivatives     REACTION: reaction  not known   Current Outpatient Prescriptions on File Prior to Visit  Medication Sig Dispense Refill  . levothyroxine (SYNTHROID, LEVOTHROID) 50 MCG tablet Take 1 tablet daily and take 2 tablets on Sunday 104 tablet 0  . NON FORMULARY 3 tablets. Itis-Care    . NON FORMULARY 1 tablet 2 (two) times daily. Osteo-mins (am pill and pm pill)    .  NON FORMULARY Take 1 tablet by mouth 3 (three) times daily. gastroDigeste II    . NON FORMULARY 2 drops daily. Vista  2    . Thiamine HCl (VITAMIN B-1 PO) Take by mouth daily.    Marland Kitchen VITAMIN D, CHOLECALCIFEROL, PO Take 5,000 Units by mouth daily.     No current facility-administered medications on file prior to visit.     Review of Systems     Objective:   Physical Exam        Assessment & Plan:

## 2014-11-13 NOTE — Progress Notes (Signed)
Pre visit review using our clinic review tool, if applicable. No additional management support is needed unless otherwise documented below in the visit note. 

## 2014-11-14 ENCOUNTER — Encounter: Payer: Self-pay | Admitting: *Deleted

## 2014-11-15 NOTE — Assessment & Plan Note (Signed)
Seen hematology No symptoms  Thought to be 2ndary to RA Cbc with lab today

## 2014-11-15 NOTE — Assessment & Plan Note (Signed)
Disc goals for lipids and reasons to control them Rev labs with pt Rev low sat fat diet in detail Lab today

## 2014-11-15 NOTE — Assessment & Plan Note (Addendum)
Reviewed health habits including diet and exercise and skin cancer prevention Reviewed appropriate screening tests for age  Also reviewed health mt list, fam hx and immunization status , as well as social and family history   See HPI Enc to work on Insurance underwriter paperwork and given packet to review Pt will schedule her own mammogram when it is due  At that time she will call for dexa ref so we can get that set at the same time  Labs today

## 2014-11-15 NOTE — Assessment & Plan Note (Signed)
tsh today No clinical changes  

## 2014-11-15 NOTE — Assessment & Plan Note (Signed)
Due for 2 y dexa  she will call for referral when she schedules her mammogram  Disc need for calcium/ vitamin D/ wt bearing exercise and bone density test every 2 y to monitor Disc safety/ fracture risk in detail   Check D level with labs today

## 2014-12-12 ENCOUNTER — Other Ambulatory Visit: Payer: Self-pay | Admitting: Family Medicine

## 2014-12-12 DIAGNOSIS — E038 Other specified hypothyroidism: Secondary | ICD-10-CM

## 2014-12-14 ENCOUNTER — Other Ambulatory Visit (INDEPENDENT_AMBULATORY_CARE_PROVIDER_SITE_OTHER): Payer: Medicare Other

## 2014-12-14 DIAGNOSIS — E038 Other specified hypothyroidism: Secondary | ICD-10-CM

## 2014-12-14 LAB — TSH: TSH: 4.98 u[IU]/mL — ABNORMAL HIGH (ref 0.35–4.50)

## 2015-01-05 ENCOUNTER — Telehealth: Payer: Self-pay

## 2015-01-05 NOTE — Telephone Encounter (Signed)
Pt left v/m at 4:57 pm to call pt; I called pt and she has billing question about wellness exam. Pt will cb on Mon to speak with Rose.

## 2015-01-09 ENCOUNTER — Encounter: Payer: Self-pay | Admitting: Family Medicine

## 2015-01-09 ENCOUNTER — Telehealth: Payer: Self-pay | Admitting: Family Medicine

## 2015-01-09 NOTE — Telephone Encounter (Signed)
Please put her in a 30 min slot Thanks

## 2015-01-09 NOTE — Telephone Encounter (Signed)
Pt's insurance is offering her a gift card if she will get a physical complete prior to 05/17/2015.  Can you accommodate this? Thank you.

## 2015-01-11 ENCOUNTER — Telehealth: Payer: Self-pay | Admitting: Family Medicine

## 2015-01-11 NOTE — Telephone Encounter (Signed)
Called patient and her husband stated he will have her call back. Will notified her the info in the result note.

## 2015-01-11 NOTE — Telephone Encounter (Signed)
Pt scheduled 04/23/2015

## 2015-01-11 NOTE — Telephone Encounter (Signed)
Pt called wanting to know the results of her bone density.  Do you have those results yet? Pt requests c/b. Thank you.

## 2015-01-11 NOTE — Telephone Encounter (Signed)
I just did result comment

## 2015-01-11 NOTE — Telephone Encounter (Signed)
Left mssg for pt to return call.

## 2015-03-21 ENCOUNTER — Other Ambulatory Visit: Payer: Self-pay | Admitting: Family Medicine

## 2015-03-21 DIAGNOSIS — E038 Other specified hypothyroidism: Secondary | ICD-10-CM

## 2015-03-23 LAB — HM MAMMOGRAPHY: HM Mammogram: NORMAL

## 2015-03-27 ENCOUNTER — Encounter: Payer: Self-pay | Admitting: Family Medicine

## 2015-03-27 ENCOUNTER — Telehealth: Payer: Self-pay | Admitting: Radiology

## 2015-03-27 ENCOUNTER — Other Ambulatory Visit (INDEPENDENT_AMBULATORY_CARE_PROVIDER_SITE_OTHER): Payer: Medicare Other

## 2015-03-27 DIAGNOSIS — E785 Hyperlipidemia, unspecified: Secondary | ICD-10-CM | POA: Diagnosis not present

## 2015-03-27 DIAGNOSIS — E038 Other specified hypothyroidism: Secondary | ICD-10-CM | POA: Diagnosis not present

## 2015-03-27 LAB — LIPID PANEL
Cholesterol: 237 mg/dL — ABNORMAL HIGH (ref 0–200)
HDL: 63.5 mg/dL (ref >39.00)
LDL Cholesterol: 155 mg/dL — ABNORMAL HIGH (ref 0–99)
NONHDL: 173.5
Total CHOL/HDL Ratio: 4
Triglycerides: 95 mg/dL (ref 0.0–149.0)
VLDL: 19 mg/dL (ref 0.0–40.0)

## 2015-03-27 LAB — TSH: TSH: 3.36 u[IU]/mL (ref 0.35–4.50)

## 2015-03-27 NOTE — Telephone Encounter (Signed)
Lipid panel added

## 2015-03-27 NOTE — Telephone Encounter (Signed)
Pt had a lipid check at a health fair, Chol 187, which is very low considering she had it done 2 months before. This was done 2.6.16. Can we add a lipid panel to today's labs

## 2015-03-27 NOTE — Telephone Encounter (Signed)
Yes-please add it , thanks

## 2015-03-28 ENCOUNTER — Encounter: Payer: Self-pay | Admitting: Family Medicine

## 2015-03-28 ENCOUNTER — Encounter: Payer: Self-pay | Admitting: *Deleted

## 2015-04-23 ENCOUNTER — Encounter: Payer: Medicare Other | Admitting: Family Medicine

## 2015-05-15 ENCOUNTER — Ambulatory Visit (INDEPENDENT_AMBULATORY_CARE_PROVIDER_SITE_OTHER): Payer: Medicare Other | Admitting: Family Medicine

## 2015-05-15 ENCOUNTER — Encounter: Payer: Self-pay | Admitting: Family Medicine

## 2015-05-15 VITALS — BP 110/72 | HR 66 | Temp 98.4°F | Ht 67.0 in | Wt 139.2 lb

## 2015-05-15 DIAGNOSIS — E039 Hypothyroidism, unspecified: Secondary | ICD-10-CM | POA: Diagnosis not present

## 2015-05-15 DIAGNOSIS — Z0000 Encounter for general adult medical examination without abnormal findings: Secondary | ICD-10-CM | POA: Diagnosis not present

## 2015-05-15 DIAGNOSIS — Z23 Encounter for immunization: Secondary | ICD-10-CM

## 2015-05-15 DIAGNOSIS — M8580 Other specified disorders of bone density and structure, unspecified site: Secondary | ICD-10-CM

## 2015-05-15 NOTE — Progress Notes (Signed)
Pre visit review using our clinic review tool, if applicable. No additional management support is needed unless otherwise documented below in the visit note. 

## 2015-05-15 NOTE — Patient Instructions (Signed)
Pneumonia shot today (PCV 13) Watch diet for cholesterol - Avoid red meat/ fried foods/ egg yolks/ fatty breakfast meats/ butter, cheese and high fat dairy/ and shellfish   If you change your mind about cholesterol medicine please let me know  Take care of yourself  Stay active

## 2015-05-15 NOTE — Progress Notes (Signed)
Subjective:    Patient ID: Jennifer Sellers, female    DOB: 15-Dec-1945, 69 y.o.   MRN: [id number]  HPI Here for annual medicare wellness visit as well as chronic/acute medical problems   I have personally reviewed the Medicare Annual Wellness questionnaire and have noted 1. The patient's medical and social history 2. Their use of alcohol, tobacco or illicit drugs 3. Their current medications and supplements 4. The patient's functional ability including ADL's, fall risks, home safety risks and hearing or visual             impairment. 5. Diet and physical activities 6. Evidence for depression or mood disorders  The patients weight, height, BMI have been recorded in the chart and visual acuity is per eye clinic.  I have made referrals, counseling and provided education to the patient based review of the above and I have provided the pt with a written personalized care plan for preventive services. Reviewed and updated provider list, see scanned forms.  See scanned forms.  Routine anticipatory guidance given to patient.  See health maintenance. Colon cancer screening 3/14 - polyp - pt unsure if it was a 5 or 10 years  Breast cancer screening 5/16 normal  Self breast exam- no lumps or changes  Has had a hysterectomy  Flu vaccine- does not take  Tetanus vaccine 5/08  Pneumovax- due for the PCV 13 - unsure if she wants it  Zoster vaccine- def as well  dexa 2/16 , some osteopenia at the hip - takes ca and D, D level is 29  Advance directive- has that done now  Cognitive function addressed- see scanned forms- and if abnormal then additional documentation follows.  She forgets a few things/nothing concerning   PMH and SH reviewed  Meds, vitals, and allergies reviewed.   ROS: See HPI.  Otherwise negative.      had labs in Dec    Chemistry      Component Value Date/Time   NA 142 11/13/2014 1019   K 4.0 11/13/2014 1019   CL 109 11/13/2014 1019   CO2 30 11/13/2014 1019   BUN 17  11/13/2014 1019   CREATININE 0.9 11/13/2014 1019      Component Value Date/Time   CALCIUM 8.8 11/13/2014 1019   ALKPHOS 62 11/13/2014 1019   AST 18 11/13/2014 1019   ALT 11 11/13/2014 1019   BILITOT 1.3* 11/13/2014 1019      Lab Results  Component Value Date   WBC 2.9* 11/13/2014   HGB 12.1 11/13/2014   HCT 36.1 11/13/2014   MCV 93.7 11/13/2014   PLT 173.0 11/13/2014     Hypothyroid  Lab Results  Component Value Date   TSH 3.36 03/27/2015     Hyperlipidemia  Lab Results  Component Value Date   CHOL 237* 03/27/2015   CHOL 233* 11/13/2014   CHOL 223* 11/11/2013   Lab Results  Component Value Date   HDL 63.50 03/27/2015   HDL 65.30 11/13/2014   HDL 54.10 11/11/2013   Lab Results  Component Value Date   LDLCALC 155* 03/27/2015   LDLCALC 152* 11/13/2014   LDLCALC 165* 09/21/2012   Lab Results  Component Value Date   TRIG 95.0 03/27/2015   TRIG 80.0 11/13/2014   TRIG 76.0 11/11/2013   Lab Results  Component Value Date   CHOLHDL 4 03/27/2015   CHOLHDL 4 11/13/2014   CHOLHDL 4 11/11/2013   Lab Results  Component Value Date   LDLDIRECT 163.4 11/11/2013  LDLDIRECT 152.5 08/14/2011   LDLDIRECT 145.0 04/27/2007   diet is good overall  She has fatty foods seldomly  She declines cholesterol medication  She plans to exercise  Has a good HDL   Vit D 29  Patient Active Problem List   Diagnosis Date Noted  . Chest pressure 08/23/2014  . Dyspepsia 08/23/2014  . Abnormal ankle brachial index 08/23/2014  . Rheumatoid arthritis 06/19/2014  . Thrombocytopenia 06/19/2014  . Encounter for Medicare annual wellness exam 11/11/2013  . Hyperbilirubinemia 08/19/2013  . Hypothyroid 09/21/2012  . Trigger finger 09/21/2012  . NEOPLASMS UNSPEC NATURE BONE SOFT TISSUE&SKIN 10/23/2010  . 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 OTHER SYMPTOMS REFERABLE JOINT SITE UNSPECIFIED 10/23/2010  . CARPAL TUNNEL SYNDROME, HX OF 04/05/2009  . LEUKOCYTOPENIA UNSPECIFIED 05/02/2008  . DRY  MOUTH 04/28/2008  . INSOMNIA, TRANSIENT 04/27/2007  . Osteopenia 04/27/2007  . Hyperlipidemia 04/23/2007  . HIATAL HERNIA 04/23/2007  . POSTMENOPAUSAL STATUS 04/23/2007  . RHEUMATOID FACTOR, POSITIVE 04/23/2007   Past Medical History  Diagnosis Date  . Leukopenia   . Arthritis     rheumatoid arthritis  . Thyroid disease    Past Surgical History  Procedure Laterality Date  . Carpal tunnel release    . Abdominal hysterectomy     History  Substance Use Topics  . Smoking status: Never Smoker   . Smokeless tobacco: Never Used  . Alcohol Use: 0.0 oz/week    0 Standard drinks or equivalent per week     Comment: occasional   Family History  Problem Relation Age of Onset  . Cancer Mother     lymphoma  . Cancer Brother     lymphoma  . Cancer Daughter     NHL   Allergies  Allergen Reactions  . Cortisporin [Bacitra-Neomycin-Polymyxin-Hc]     Burning / swelling of eyes with eye drops  . Sulfonamide Derivatives     REACTION: reaction  not known   Current Outpatient Prescriptions on File Prior to Visit  Medication Sig Dispense Refill  . levothyroxine (SYNTHROID, LEVOTHROID) 50 MCG tablet Take 1 tablet daily and take 2 tablets on Sunday 104 tablet 3  . NON FORMULARY 3 tablets. Itis-Care    . NON FORMULARY 1 tablet 2 (two) times daily. Osteo-mins (am pill and pm pill)    . NON FORMULARY Take 1 tablet by mouth 3 (three) times daily. gastroDigeste II    . NON FORMULARY 2 drops daily. Vista  2    . Thiamine HCl (VITAMIN B-1 PO) Take by mouth daily.    Marland Kitchen VITAMIN D, CHOLECALCIFEROL, PO Take 5,000 Units by mouth daily.     No current facility-administered medications on file prior to visit.      Review of Systems     Objective:   Physical Exam        Assessment & Plan:

## 2015-05-17 NOTE — Assessment & Plan Note (Signed)
Reviewed health habits including diet and exercise and skin cancer prevention Reviewed appropriate screening tests for age  Also reviewed health mt list, fam hx and immunization status , as well as social and family history   Lab rev See HPI dexa utd and no falls or fx prevnar vaccine today Disc diet for chol control

## 2015-05-17 NOTE — Assessment & Plan Note (Signed)
Rev dexa 2/16 On ca and D with D level of 29  Disc need for calcium/ vitamin D/ wt bearing exercise and bone density test every 2 y to monitor Disc safety/ fracture risk in detail

## 2015-05-17 NOTE — Assessment & Plan Note (Signed)
Reviewed health habits including diet and exercise and skin cancer prevention Reviewed appropriate screening tests for age  Also reviewed health mt list, fam hx and immunization status , as well as social and family history   See HPI prevnar vaccine today Disc diet for cholesterol control

## 2015-05-17 NOTE — Assessment & Plan Note (Signed)
Hypothyroidism  Pt has no clinical changes No change in energy level/ hair or skin/ edema and no tremor Lab Results  Component Value Date   TSH 3.36 03/27/2015

## 2015-05-23 ENCOUNTER — Telehealth: Payer: Self-pay | Admitting: Family Medicine

## 2015-05-23 NOTE — Telephone Encounter (Signed)
Please check in with her - use warm compresses /antihistamine if needed  Update if fever or other symptoms  This should improve - but if worse or not imp f/u

## 2015-05-23 NOTE — Telephone Encounter (Signed)
[facility] Patient Name: Jennifer Sellers DOB: 09/29/1946 Initial Comment caller states she got a pneumonia shot - has woke up this am - thinks she has had a reaction - has swelling and warmth at the injection site Nurse Assessment Nurse: Markus Daft, RN, Sherre Poot Date/Time ([place] Time): 05/23/2015 8:54:04 AM Confirm and document reason for call. If symptomatic, describe symptoms. ---Caller states she got a pneumonia (PNA) shot on 05/15/15. Just today she has woke up this am with itching on her arm for about 30 minutes, and was scratching her arm. She thinks she has had a reaction as now she has 2 of inches of redness and swelling, and hot to touch at the injection site. No itching now. - She started with dry cough the same night as PNA shot. Took an Airborne OTC tab and has felt better. Cough gone by Friday. Has the patient traveled out of the country within the last 30 days? ---Not Applicable Does the patient require triage? ---Yes Related visit to physician within the last 2 weeks? ---Yes Does the PT have any chronic conditions? (i.e. diabetes, asthma, etc.) ---Yes List chronic conditions. ---Seasonal allergies - possible; Hypothyroid Guidelines Guideline Title Affirmed Question Affirmed Notes Immunization Reactions [1] Redness or red streak around the injection site AND [2] begins > 48 hours after shot AND [3] no fever (Exception: red area < 1 inch or 2.5 cm wide) about 2 inches of redness/swelling/heat/ tender to touch Final Disposition User See Physician within [place], RN, [place] Comments RN attempted to make appt for today, but nothing available for appts. Please call pt back if she can be worked in with Dr. Glori Bickers for this site check which she prefers. Thanks.

## 2015-05-23 NOTE — Telephone Encounter (Signed)
Pt notified of Dr. Marliss Coots comment/recommendations and advise to f/u if no improvement or develops any other sxs, pt verbalized understanding

## 2015-08-03 ENCOUNTER — Encounter: Payer: Self-pay | Admitting: Family Medicine

## 2015-08-03 ENCOUNTER — Ambulatory Visit (INDEPENDENT_AMBULATORY_CARE_PROVIDER_SITE_OTHER): Payer: Medicare Other | Admitting: Family Medicine

## 2015-08-03 ENCOUNTER — Telehealth: Payer: Self-pay | Admitting: Family Medicine

## 2015-08-03 VITALS — BP 106/76 | HR 64 | Temp 98.2°F | Ht 67.0 in | Wt 141.2 lb

## 2015-08-03 DIAGNOSIS — M8580 Other specified disorders of bone density and structure, unspecified site: Secondary | ICD-10-CM

## 2015-08-03 DIAGNOSIS — E559 Vitamin D deficiency, unspecified: Secondary | ICD-10-CM | POA: Insufficient documentation

## 2015-08-03 DIAGNOSIS — E785 Hyperlipidemia, unspecified: Secondary | ICD-10-CM

## 2015-08-03 NOTE — Telephone Encounter (Signed)
I saw her today and plan to no charge her for the visit -(I told her that at today's visit) I also tried to answer some questions.  I do not know why her insurance told her to come in for a 2nd visit but I am not going to charge her anything.  I really do not know what to do further than that  I reviewed her bone density test with her today as well as discussed her cholesterol so it was not truly as waste of time (in my opinion).    I tried my best.

## 2015-08-03 NOTE — Telephone Encounter (Signed)
Patient states she feels bad about wasting Dr. Marliss Coots time today.  Pt explained that [facility] is actually the one who told her to come for another physical.  I explained we had already done the CPE and the wellness for the year and she did not need another one until 2017.  The patient went ahead and made an appointment for April 2017 and wants to get a CPE and a Physical at that time.  I told her if she still has [employer] at that time, it would be okay to do that since it would be a new calendar year. Patient is now satisfied.

## 2015-08-03 NOTE — Progress Notes (Signed)
Subjective:    Patient ID: Jennifer Sellers, female    DOB: 05/24/1946, 69 y.o.   MRN: [id number]  HPI Here for f/u  She thought it was for a PE but she had one in June Pt is upset because her ins told her she needed a physical - is confused   Had a bad local reaction to PCV 13 - will not do the PCV 23 because of there  Declines flu shot    Bone density test  Osteopenia of hip -1.7 T score  frax 2.4% for hip fx and maj ost 12%  No fractures  Small frame - but is tall  No strong fam hx of OP  No steroids  Is on thyroid tx    Lab Results  Component Value Date   TSH 3.36 03/27/2015   does not fall  Does not smoke   Does exercise - walking  Needs to do more  She takes her vit D 5000 iu daily  Eats foods with calcium    Has high cholesterol  Lab Results  Component Value Date   CHOL 237* 03/27/2015   CHOL 233* 11/13/2014   CHOL 223* 11/11/2013   Lab Results  Component Value Date   HDL 63.50 03/27/2015   HDL 65.30 11/13/2014   HDL 54.10 11/11/2013   Lab Results  Component Value Date   LDLCALC 155* 03/27/2015   LDLCALC 152* 11/13/2014   LDLCALC 165* 09/21/2012   Lab Results  Component Value Date   TRIG 95.0 03/27/2015   TRIG 80.0 11/13/2014   TRIG 76.0 11/11/2013   Lab Results  Component Value Date   CHOLHDL 4 03/27/2015   CHOLHDL 4 11/13/2014   CHOLHDL 4 11/11/2013   Lab Results  Component Value Date   LDLDIRECT 163.4 11/11/2013   LDLDIRECT 152.5 08/14/2011   LDLDIRECT 145.0 04/27/2007   great HDL LDL is mildly high  Does not want to take cholesterol   Thyroid is stable Lab Results  Component Value Date   TSH 3.36 03/27/2015     Patient Active Problem List   Diagnosis Date Noted  . Vitamin D deficiency 08/03/2015  . Routine general medical examination at a health care facility 05/15/2015  . Chest pressure 08/23/2014  . Dyspepsia 08/23/2014  . Abnormal ankle brachial index 08/23/2014  . Rheumatoid arthritis 06/19/2014  . Thrombocytopenia  06/19/2014  . Encounter for Medicare annual wellness exam 11/11/2013  . Hyperbilirubinemia 08/19/2013  . Hypothyroid 09/21/2012  . Trigger finger 09/21/2012  . NEOPLASMS UNSPEC NATURE BONE SOFT TISSUE&SKIN 10/23/2010  . 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 OTHER SYMPTOMS REFERABLE JOINT SITE UNSPECIFIED 10/23/2010  . CARPAL TUNNEL SYNDROME, HX OF 04/05/2009  . Leukocytopenia 05/02/2008  . DRY MOUTH 04/28/2008  . INSOMNIA, TRANSIENT 04/27/2007  . Osteopenia 04/27/2007  . Hyperlipidemia 04/23/2007  . HIATAL HERNIA 04/23/2007  . POSTMENOPAUSAL STATUS 04/23/2007  . RHEUMATOID FACTOR, POSITIVE 04/23/2007   Past Medical History  Diagnosis Date  . Leukopenia   . Arthritis     rheumatoid arthritis  . Thyroid disease    Past Surgical History  Procedure Laterality Date  . Carpal tunnel release    . Abdominal hysterectomy     Social History  Substance Use Topics  . Smoking status: Never Smoker   . Smokeless tobacco: Never Used  . Alcohol Use: 0.0 oz/week    0 Standard drinks or equivalent per week     Comment: occasional   Family History  Problem Relation Age of Onset  .  Cancer Mother     lymphoma  . Cancer Brother     lymphoma  . Cancer Daughter     NHL   Allergies  Allergen Reactions  . Cortisporin [Bacitra-Neomycin-Polymyxin-Hc]     Burning / swelling of eyes with eye drops  . Prevnar [Pneumococcal 13-Val Conj Vacc] Other (See Comments)    Severe local reaction   . Sulfonamide Derivatives     REACTION: reaction  not known   Current Outpatient Prescriptions on File Prior to Visit  Medication Sig Dispense Refill  . levothyroxine (SYNTHROID, LEVOTHROID) 50 MCG tablet Take 1 tablet daily and take 2 tablets on Sunday 104 tablet 3  . NON FORMULARY 3 tablets once a week. Itis-Care    . NON FORMULARY 1 tablet 2 (two) times daily. Osteo-mins (am pill and pm pill)    . NON FORMULARY Take 1 tablet by mouth 3 (three) times daily. gastroDigeste II    . NON FORMULARY 2 drops daily. Vista   2    . Thiamine HCl (VITAMIN B-1 PO) Take by mouth daily.    Marland Kitchen VITAMIN D, CHOLECALCIFEROL, PO Take 5,000 Units by mouth daily.     No current facility-administered medications on file prior to visit.    Review of Systems    Review of Systems       Objective:   Physical Exam        Assessment & Plan:

## 2015-08-03 NOTE — Telephone Encounter (Signed)
Pt requests call back -  Regarding [phone] Regarding something you discussed with her earlier.  Thanks

## 2015-08-03 NOTE — Telephone Encounter (Signed)
Great! Thanks for working that out , and April sounds fine if her insurance covers it

## 2015-08-03 NOTE — Telephone Encounter (Signed)
Spoke with pt who was upset that Dr. Glori Bickers didn't call her back directly. I advise pt that I was Dr. Marliss Coots assistant and would relay any message to Dr. Glori Bickers. Pt was still upset that we billed her a regular CPE and a Medicare wellness exam on the same day on 05/15/15, I advise pt several times the reason is that at a Medicare wellness visit only certain things can be addressed and if you discuss anything outside of the [facility] Wellness visit that it is considered a separate visit. Pt was still upset and was upset that she came in today for "no reason" and she is upset because she wanted to have 2 separate visits on 2 separate dates instead of one combined visit and she wouldn't have came in today if she knew that she had already had her 2 OV for the year. I advise pt I would give Dr. Glori Bickers the message as well as our office manager Vincente Liberty, pt request a call back about this issue

## 2015-08-03 NOTE — Patient Instructions (Signed)
Take care of yourself  Eat a balanced diet (for cholesterol Avoid red meat/ fried foods/ egg yolks/ fatty breakfast meats/ butter, cheese and high fat dairy/ and shellfish  )

## 2015-08-03 NOTE — Progress Notes (Signed)
Pre visit review using our clinic review tool, if applicable. No additional management support is needed unless otherwise documented below in the visit note. 

## 2015-08-05 NOTE — Assessment & Plan Note (Signed)
Level in 20-30 range  Enc to be compliant with 5000 iu D daily Disc imp to bone and overall health

## 2015-08-05 NOTE — Assessment & Plan Note (Signed)
Disc need for calcium/ vitamin D/ wt bearing exercise and bone density test every 2 y to monitor Disc safety/ fracture risk in detail   Rev dexa in detail  No falls or fractures  No addn risk factors  T score -1.7 at hip Pt would pref to avoid medication if possible

## 2015-08-05 NOTE — Assessment & Plan Note (Signed)
Good HDL Disc goals for lipids and reasons to control them Rev labs with pt Rev low sat fat diet in detail Pt declines cholesterol medicine

## 2015-08-28 ENCOUNTER — Encounter: Payer: Self-pay | Admitting: Family Medicine

## 2015-11-07 ENCOUNTER — Other Ambulatory Visit: Payer: Self-pay

## 2015-11-07 MED ORDER — LEVOTHYROXINE SODIUM 50 MCG PO TABS: ORAL_TABLET | ORAL | Status: DC

## 2015-11-07 NOTE — Telephone Encounter (Signed)
Patient left v/m requesting refill on Levothyroxine. Refill sent.

## 2016-02-26 ENCOUNTER — Encounter: Payer: Medicare Other | Admitting: Family Medicine

## 2016-03-10 ENCOUNTER — Encounter: Payer: Self-pay | Admitting: Family Medicine

## 2016-03-10 ENCOUNTER — Ambulatory Visit (INDEPENDENT_AMBULATORY_CARE_PROVIDER_SITE_OTHER): Payer: Medicare Other | Admitting: Family Medicine

## 2016-03-10 VITALS — BP 112/76 | HR 68 | Temp 98.1°F | Ht 66.5 in | Wt 141.0 lb

## 2016-03-10 DIAGNOSIS — E039 Hypothyroidism, unspecified: Secondary | ICD-10-CM

## 2016-03-10 DIAGNOSIS — E559 Vitamin D deficiency, unspecified: Secondary | ICD-10-CM | POA: Diagnosis not present

## 2016-03-10 DIAGNOSIS — Z0000 Encounter for general adult medical examination without abnormal findings: Secondary | ICD-10-CM | POA: Diagnosis not present

## 2016-03-10 DIAGNOSIS — D696 Thrombocytopenia, unspecified: Secondary | ICD-10-CM

## 2016-03-10 DIAGNOSIS — M8580 Other specified disorders of bone density and structure, unspecified site: Secondary | ICD-10-CM

## 2016-03-10 DIAGNOSIS — E785 Hyperlipidemia, unspecified: Secondary | ICD-10-CM

## 2016-03-10 LAB — CBC WITH DIFFERENTIAL/PLATELET
Basophils Absolute: 0 10*3/uL (ref 0.0–0.1)
Basophils Relative: 0.6 % (ref 0.0–3.0)
EOS ABS: 0.1 10*3/uL (ref 0.0–0.7)
EOS PCT: 2.9 % (ref 0.0–5.0)
HCT: 38 % (ref 36.0–46.0)
Hemoglobin: 13.2 g/dL (ref 12.0–15.0)
Lymphocytes Relative: 39.5 % (ref 12.0–46.0)
Lymphs Abs: 1.3 10*3/uL (ref 0.7–4.0)
MCHC: 34.7 g/dL (ref 30.0–36.0)
MCV: 91 fl (ref 78.0–100.0)
MONO ABS: 0.2 10*3/uL (ref 0.1–1.0)
Monocytes Relative: 5.5 % (ref 3.0–12.0)
NEUTROS ABS: 1.8 10*3/uL (ref 1.4–7.7)
Neutrophils Relative %: 51.5 % (ref 43.0–77.0)
PLATELETS: 175 10*3/uL (ref 150.0–400.0)
RBC: 4.18 Mil/uL (ref 3.87–5.11)
RDW: 13.8 % (ref 11.5–15.5)
WBC: 3.4 10*3/uL — ABNORMAL LOW (ref 4.0–10.5)

## 2016-03-10 LAB — COMPREHENSIVE METABOLIC PANEL
ALK PHOS: 70 U/L (ref 39–117)
ALT: 11 U/L (ref 0–35)
AST: 20 U/L (ref 0–37)
Albumin: 4.3 g/dL (ref 3.5–5.2)
BUN: 15 mg/dL (ref 6–23)
CALCIUM: 9.4 mg/dL (ref 8.4–10.5)
CHLORIDE: 104 meq/L (ref 96–112)
CO2: 29 mEq/L (ref 19–32)
CREATININE: 0.84 mg/dL (ref 0.40–1.20)
GFR: 70.83 mL/min (ref >60.00)
Glucose, Bld: 87 mg/dL (ref 70–99)
POTASSIUM: 3.8 meq/L (ref 3.5–5.1)
SODIUM: 140 meq/L (ref 135–145)
Total Bilirubin: 2.6 mg/dL — ABNORMAL HIGH (ref 0.2–1.2)
Total Protein: 7.3 g/dL (ref 6.0–8.3)

## 2016-03-10 LAB — LIPID PANEL
Cholesterol: 233 mg/dL — ABNORMAL HIGH (ref 0–200)
HDL: 61.7 mg/dL (ref >39.00)
LDL Cholesterol: 152 mg/dL — ABNORMAL HIGH (ref 0–99)
NONHDL: 171.2
Total CHOL/HDL Ratio: 4
Triglycerides: 97 mg/dL (ref 0.0–149.0)
VLDL: 19.4 mg/dL (ref 0.0–40.0)

## 2016-03-10 LAB — VITAMIN D 25 HYDROXY (VIT D DEFICIENCY, FRACTURES): VITD: 41.6 ng/mL (ref 30.00–100.00)

## 2016-03-10 LAB — TSH: TSH: 3.64 u[IU]/mL (ref 0.35–4.50)

## 2016-03-10 NOTE — Assessment & Plan Note (Addendum)
Reviewed health habits including diet and exercise and skin cancer prevention Reviewed appropriate screening tests for age  Also reviewed health mt list, fam hx and immunization status , as well as social and family history   See HPI Labs today Declines some imms / had rxn to prevnar Don't forget to schedule your mammogram  Labs today  Take care of yourself   Try to get regular exercise  Do not climb on stools and ladders to prevent falls (given handout on fall prev)  See Katha Cabal for medicare visit in June

## 2016-03-10 NOTE — Assessment & Plan Note (Addendum)
In pt with osteopenia Disc imp to bone and overall health Level today

## 2016-03-10 NOTE — Assessment & Plan Note (Signed)
dexa rev 2/16 Avoiding pharm therapy Disc need for calcium/ vitamin D/ wt bearing exercise and bone density test every 2 y to monitor Disc safety/ fracture risk in detail    Due for dexa 2/18 No falls or fx

## 2016-03-10 NOTE — Assessment & Plan Note (Signed)
Lab today Has seen hematology No bruising or bleeding

## 2016-03-10 NOTE — Patient Instructions (Addendum)
Don't forget to schedule your mammogram  Labs today  Take care of yourself   Try to get regular exercise  Do not climb on stools and ladders to prevent falls   See Katha Cabal for medicare visit in June

## 2016-03-10 NOTE — Assessment & Plan Note (Signed)
tsh today  No clinical changes  Nl exam

## 2016-03-10 NOTE — Progress Notes (Signed)
Pre visit review using our clinic review tool, if applicable. No additional management support is needed unless otherwise documented below in the visit note. 

## 2016-03-10 NOTE — Assessment & Plan Note (Signed)
Disc goals for lipids and reasons to control them Rev labs with pt from last draw Lipid panel today Rev low sat fat diet in detail

## 2016-03-10 NOTE — Progress Notes (Signed)
Subjective:    Patient ID: Jennifer Sellers, female    DOB: [birth year], 70 y.o.   MRN: [id number]  HPI Here for health maintenance exam and to review chronic medical problems    Pt has visit with Katha Cabal for her AMW visit in June 2017  Feels pretty good   Wt is stable with bmi of 22  Flu shot -declines Zoster vaccine -declines  PNA-had a reaction to the 13 / will not do 23   Mammogram nl 5/16  Self breast exam- no lumps or changes Does not check often (does have fibrocystic changed)    Gyn health-no hx of abn pap or new sexual partners  No symptoms    Td 5/08   Colonoscopy nl 3/14- adenomatous polyp - ? 5 year recall  Had ifob 9/16 that was neg from outside the office  Neg hep C test 1/15  dexa 2/16 osteopenia Disc at visit in the fall-pt chooses to avoid pharm tx at this time Had a fall in Nov - it bothered her hip for a while - no fractures (saw ortho)- getting much better Was on a step stool (small)- got off balance  She is taking vitamin D , not taking calcium (was told that there was copper in her ca supplement)-but her chiropractor - will look for a sub   fam hx of lymphoma    Due for labs  Hx of low wbc and platelets in the past  No bleeding problems   Eating well for her cholesterol  Lab Results  Component Value Date   CHOL 237* 03/27/2015   HDL 63.50 03/27/2015   LDLCALC 155* 03/27/2015   LDLDIRECT 163.4 11/11/2013   TRIG 95.0 03/27/2015   CHOLHDL 4 03/27/2015    Patient Active Problem List   Diagnosis Date Noted  . Vitamin D deficiency 08/03/2015  . Routine general medical examination at a health care facility 05/15/2015  . Chest pressure 08/23/2014  . Dyspepsia 08/23/2014  . Abnormal ankle brachial index 08/23/2014  . Rheumatoid arthritis ([place]) 06/19/2014  . Thrombocytopenia ([place]) 06/19/2014  . Encounter for Medicare annual wellness exam 11/11/2013  . Hyperbilirubinemia 08/19/2013  . Hypothyroid 09/21/2012  . Trigger finger 09/21/2012  .  NEOPLASMS UNSPEC NATURE BONE SOFT TISSUE&SKIN 10/23/2010  . 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 OTHER SYMPTOMS REFERABLE JOINT SITE UNSPECIFIED 10/23/2010  . CARPAL TUNNEL SYNDROME, HX OF 04/05/2009  . Leukocytopenia 05/02/2008  . DRY MOUTH 04/28/2008  . INSOMNIA, TRANSIENT 04/27/2007  . Osteopenia 04/27/2007  . Hyperlipidemia 04/23/2007  . HIATAL HERNIA 04/23/2007  . POSTMENOPAUSAL STATUS 04/23/2007  . RHEUMATOID FACTOR, POSITIVE 04/23/2007   Past Medical History  Diagnosis Date  . Leukopenia   . Arthritis     rheumatoid arthritis  . Thyroid disease    Past Surgical History  Procedure Laterality Date  . Carpal tunnel release    . Abdominal hysterectomy     Social History  Substance Use Topics  . Smoking status: Never Smoker   . Smokeless tobacco: Never Used  . Alcohol Use: 0.0 oz/week    0 Standard drinks or equivalent per week     Comment: occasional   Family History  Problem Relation Age of Onset  . Cancer Mother     lymphoma  . Cancer Brother     lymphoma  . Cancer Daughter     NHL   Allergies  Allergen Reactions  . Cortisporin [Bacitra-Neomycin-Polymyxin-Hc]     Burning / swelling of eyes with eye drops  .  Prevnar [Pneumococcal 13-Val Conj Vacc] Other (See Comments)    Severe local reaction   . Sulfonamide Derivatives     REACTION: reaction  not known   Current Outpatient Prescriptions on File Prior to Visit  Medication Sig Dispense Refill  . levothyroxine (SYNTHROID, LEVOTHROID) 50 MCG tablet Take 1 tablet daily and take 2 tablets on Sunday 104 tablet 3  . NON FORMULARY 2 drops daily. Vista  2    . VITAMIN D, CHOLECALCIFEROL, PO Take 5,000 Units by mouth daily.     No current facility-administered medications on file prior to visit.     Review of Systems Review of Systems  Constitutional: Negative for fever, appetite change, fatigue and unexpected weight change.  Eyes: Negative for pain and visual disturbance.  Respiratory: Negative for cough and shortness  of breath.   Cardiovascular: Negative for cp or palpitations    Gastrointestinal: Negative for nausea, diarrhea and constipation.  Genitourinary: Negative for urgency and frequency.  Skin: Negative for pallor or rash   Neurological: Negative for weakness, light-headedness, numbness and headaches.  Hematological: Negative for adenopathy. Does not bruise/bleed easily.  Psychiatric/Behavioral: Negative for dysphoric mood. The patient is not nervous/anxious.         Objective:   Physical Exam  Constitutional: She appears well-developed and well-nourished. No distress.  Well appearing  HENT:  Head: Normocephalic and atraumatic.  Right Ear: External ear normal.  Left Ear: External ear normal.  Mouth/Throat: Oropharynx is clear and moist.  Eyes: Conjunctivae and EOM are normal. Pupils are equal, round, and reactive to light. No scleral icterus.  Neck: Normal range of motion. Neck supple. No JVD present. Carotid bruit is not present. No thyromegaly present.  Cardiovascular: Normal rate, regular rhythm, normal heart sounds and intact distal pulses.  Exam reveals no gallop.   Pulmonary/Chest: Effort normal and breath sounds normal. No respiratory distress. She has no wheezes. She exhibits no tenderness.  Abdominal: Soft. Bowel sounds are normal. She exhibits no distension, no abdominal bruit and no mass. There is no tenderness.  Genitourinary: No breast swelling, tenderness, discharge or bleeding.  Breast exam: No mass, nodules, thickening, tenderness, bulging, retraction, inflamation, nipple discharge or skin changes noted.  No axillary or clavicular LA.      Musculoskeletal: Normal range of motion. She exhibits no edema or tenderness.  No kyphosis   Lymphadenopathy:    She has no cervical adenopathy.  Neurological: She is alert. She has normal reflexes. No cranial nerve deficit. She exhibits normal muscle tone. Coordination normal.  Skin: Skin is warm and dry. No rash noted. No erythema. No  pallor.  Fair complexion Some SKs  Psychiatric: She has a normal mood and affect.          Assessment & Plan:   Problem List Items Addressed This Visit      Endocrine   Hypothyroid - Primary    tsh today  No clinical changes  Nl exam      Relevant Orders   TSH (Completed)     Musculoskeletal and Integument   Osteopenia    dexa rev 2/16 Avoiding pharm therapy Disc need for calcium/ vitamin D/ wt bearing exercise and bone density test every 2 y to monitor Disc safety/ fracture risk in detail    Due for dexa 2/18 No falls or fx        Other   Hyperlipidemia    Disc goals for lipids and reasons to control them Rev labs with pt from last draw Lipid  panel today Rev low sat fat diet in detail       Relevant Orders   Lipid panel (Completed)   Routine general medical examination at a health care facility    Reviewed health habits including diet and exercise and skin cancer prevention Reviewed appropriate screening tests for age  Also reviewed health mt list, fam hx and immunization status , as well as social and family history   See HPI Labs today Declines some imms / had rxn to prevnar Don't forget to schedule your mammogram  Labs today  Take care of yourself   Try to get regular exercise  Do not climb on stools and ladders to prevent falls (given handout on fall prev)  See Katha Cabal for medicare visit in June       Relevant Orders   CBC with Differential/Platelet (Completed)   Comprehensive metabolic panel (Completed)   TSH (Completed)   Lipid panel (Completed)   Thrombocytopenia ([place])    Lab today Has seen hematology No bruising or bleeding       Relevant Orders   CBC with Differential/Platelet (Completed)   Vitamin D deficiency    In pt with osteopenia Disc imp to bone and overall health Level today      Relevant Orders   VITAMIN D 25 Hydroxy (Vit-D Deficiency, Fractures) (Completed)

## 2016-03-11 ENCOUNTER — Encounter: Payer: Self-pay | Admitting: *Deleted

## 2016-05-15 ENCOUNTER — Ambulatory Visit (INDEPENDENT_AMBULATORY_CARE_PROVIDER_SITE_OTHER): Payer: Medicare Other

## 2016-05-15 VITALS — BP 100/72 | HR 72 | Temp 97.9°F | Ht 66.75 in | Wt 139.5 lb

## 2016-05-15 DIAGNOSIS — Z0000 Encounter for general adult medical examination without abnormal findings: Secondary | ICD-10-CM

## 2016-05-15 NOTE — Progress Notes (Signed)
Pre visit review using our clinic review tool, if applicable. No additional management support is needed unless otherwise documented below in the visit note. 

## 2016-05-15 NOTE — Patient Instructions (Signed)
Jennifer Sellers , Thank you for taking time to come for your Medicare Wellness Visit. I appreciate your ongoing commitment to your health goals. Please review the following plan we discussed and let me know if I can assist you in the future.   These are the goals we discussed: Goals    . Increase water intake     Starting 05/15/2016, I will continue to drink at least 6 glasses of water daily.        This is a list of the screening recommended for you and due dates:  Health Maintenance  Topic Date Due  . Mammogram  09/16/2016*  . Flu Shot  11/11/2018*  . Shingles Vaccine  09/23/2021*  . Tetanus Vaccine  04/05/2017  . Colon Cancer Screening  02/04/2023  . DEXA scan (bone density measurement)  Completed  .  Hepatitis C: One time screening is recommended by Center for Disease Control  (CDC) for  adults born from [birth year] through 1967.   Completed  *Topic was postponed. The date shown is not the original due date.    Preventive Care for Adults  A healthy lifestyle and preventive care can promote health and wellness. Preventive health guidelines for adults include the following key practices.  . A routine yearly physical is a good way to check with your health care provider about your health and preventive screening. It is a chance to share any concerns and updates on your health and to receive a thorough exam.  . Visit your dentist for a routine exam and preventive care every 6 months. Brush your teeth twice a day and floss once a day. Good oral hygiene prevents tooth decay and gum disease.  . The frequency of eye exams is based on your age, health, family medical history, use  of contact lenses, and other factors. Follow your health care provider's ecommendations for frequency of eye exams.  . Eat a healthy diet. Foods like vegetables, fruits, whole grains, low-fat dairy products, and lean protein foods contain the nutrients you need without too many calories. Decrease your intake of foods high  in solid fats, added sugars, and salt. Eat the right amount of calories for you. Get information about a proper diet from your health care provider, if necessary.  . Regular physical exercise is one of the most important things you can do for your health. Most adults should get at least 150 minutes of moderate-intensity exercise (any activity that increases your heart rate and causes you to sweat) each week. In addition, most adults need muscle-strengthening exercises on 2 or more days a week.  Silver Sneakers may be a benefit available to you. To determine eligibility, you may visit the website: www.silversneakers.com or contact program at (717) 713-1408 Mon-Fri between 8AM-8PM.   . Maintain a healthy weight. The body mass index (BMI) is a screening tool to identify possible weight problems. It provides an estimate of body fat based on height and weight. Your health care provider can find your BMI and can help you achieve or maintain a healthy weight.   For adults 20 years and older: ? A BMI below 18.5 is considered underweight. ? A BMI of 18.5 to 24.9 is normal. ? A BMI of 25 to 29.9 is considered overweight. ? A BMI of 30 and above is considered obese.   . Maintain normal blood lipids and cholesterol levels by exercising and minimizing your intake of saturated fat. Eat a balanced diet with plenty of fruit and vegetables. Blood tests for  lipids and cholesterol should begin at age 94 and be repeated every 5 years. If your lipid or cholesterol levels are high, you are over 50, or you are at high risk for heart disease, you may need your cholesterol levels checked more frequently. Ongoing high lipid and cholesterol levels should be treated with medicines if diet and exercise are not working.  . If you smoke, find out from your health care provider how to quit. If you do not use tobacco, please do not start.  . If you choose to drink alcohol, please do not consume more than 2 drinks per day. One  drink is considered to be 12 ounces (355 mL) of beer, 5 ounces (148 mL) of wine, or 1.5 ounces (44 mL) of liquor.  . If you are 21-77 years old, ask your health care provider if you should take aspirin to prevent strokes.  . Use sunscreen. Apply sunscreen liberally and repeatedly throughout the day. You should seek shade when your shadow is shorter than you. Protect yourself by wearing Antonini sleeves, pants, a wide-brimmed hat, and sunglasses year round, whenever you are outdoors.  . Once a month, do a whole body skin exam, using a mirror to look at the skin on your back. Tell your health care provider of new moles, moles that have irregular borders, moles that are larger than a pencil eraser, or moles that have changed in shape or color.

## 2016-05-15 NOTE — Progress Notes (Signed)
Subjective:   Jennifer Sellers is a 70 y.o. female who presents for Medicare Annual (Subsequent) preventive examination.  Review of Systems:  N/A Cardiac Risk Factors include: advanced age (>66men, >41 women);dyslipidemia     Objective:     Vitals: BP 100/72 mmHg  Pulse 72  Temp(Src) 97.9 F (36.6 C) (Oral)  Ht 5' 6.75" (1.695 m)  Wt 139 lb 8 oz (63.277 kg)  BMI 22.02 kg/m2  SpO2 98%  Body mass index is 22.02 kg/(m^2).   Tobacco History  Smoking status  . Never Smoker   Smokeless tobacco  . Never Used     Counseling given: No   Past Medical History  Diagnosis Date  . Leukopenia   . Arthritis     rheumatoid arthritis  . Thyroid disease    Past Surgical History  Procedure Laterality Date  . Carpal tunnel release    . Abdominal hysterectomy     Family History  Problem Relation Age of Onset  . Cancer Mother     lymphoma  . Cancer Brother     lymphoma  . Cancer Daughter     NHL   History  Sexual Activity  . Sexual Activity: Yes    Outpatient Encounter Prescriptions as of 05/15/2016  Medication Sig  . levothyroxine (SYNTHROID, LEVOTHROID) 50 MCG tablet Take 1 tablet daily and take 2 tablets on Sunday  . NON FORMULARY 2 drops daily. Vista  2  . NON FORMULARY Take 4 tablets by mouth daily. Total FLM:  . NON FORMULARY Take 1 tablet by mouth daily.  Marland Kitchen VITAMIN D, CHOLECALCIFEROL, PO Take 5,000 Units by mouth daily.   No facility-administered encounter medications on file as of 05/15/2016.    Activities of Daily Living In your present state of health, do you have any difficulty performing the following activities: 05/15/2016  Hearing? N  Vision? N  Difficulty concentrating or making decisions? N  Walking or climbing stairs? N  Dressing or bathing? N  Doing errands, shopping? N  Preparing Food and eating ? N  Using the Toilet? N  In the past six months, have you accidently leaked urine? N  Do you have problems with loss of bowel control? N  Managing  your Medications? N  Managing your Finances? N  Housekeeping or managing your Housekeeping? N    Patient Care Team: Abner Greenspan, MD as PCP - General Charlett Lango as Referring Physician (Ophthalmology) Birder Robson, MD as Referring Physician (Ophthalmology) Worthy Keeler, DC as Consulting Physician (Chiropractic Medicine)    Assessment:     Hearing Screening   125Hz  250Hz  500Hz  1000Hz  2000Hz  4000Hz  8000Hz   Right ear:   40 40 40 40   Left ear:   40 40 40 40   Vision Screening Comments: Last eye exam in December 2016   Exercise Activities and Dietary recommendations Current Exercise Habits: The patient does not participate in regular exercise at present, Exercise limited by: None identified  Goals    . Increase water intake     Starting 05/15/2016, I will continue to drink at least 6 glasses of water daily.       Fall Risk Fall Risk  05/15/2016 05/15/2015 11/13/2014 11/11/2013  Falls in the past year? No No No No   Depression Screen PHQ 2/9 Scores 05/15/2016 05/15/2015 11/13/2014 11/11/2013  PHQ - 2 Score 0 0 0 0     Cognitive Testing MMSE - Mini Mental State Exam 05/15/2016  Orientation to time 5  Orientation  to Place 5  Registration 3  Attention/ Calculation 0  Recall 3  Language- name 2 objects 0  Language- repeat 1  Language- follow 3 step command 3  Language- read & follow direction 0  Write a sentence 0  Copy design 0  Total score 20   PLEASE NOTE: A Mini-Cog screen was completed. Maximum score is 20. A value of 0 denotes this part of Folstein MMSE was not completed or the patient failed this part of the Mini-Cog screening.   Mini-Cog Screening Orientation to Time - Max 5 pts Orientation to Place - Max 5 pts Registration - Max 3 pts Recall - Max 3 pts Language Repeat - Max 1 pts Language Follow 3 Step Command - Max 3 pts  Immunization History  Administered Date(s) Administered  . Pneumococcal Conjugate-13 05/15/2015  . Td 04/06/2007    Screening Tests Health Maintenance  Topic Date Due  . MAMMOGRAM  09/16/2016 (Originally 03/22/2016)  . INFLUENZA VACCINE  11/11/2018 (Originally 06/17/2016)  . ZOSTAVAX  09/23/2021 (Originally 02/24/2004)  . TETANUS/TDAP  04/05/2017  . COLONOSCOPY  02/04/2023  . DEXA SCAN  Completed  . Hepatitis C Screening  Completed      Plan:     I have personally reviewed and addressed the Medicare Annual Wellness questionnaire and have noted the following in the patient's chart:  A. Medical and social history B. Use of alcohol, tobacco or illicit drugs  C. Current medications and supplements D. Functional ability and status E.  Nutritional status F.  Physical activity G. Advance directives H. List of other physicians I.  Hospitalizations, surgeries, and ER visits in previous 12 months J.  [place] to include hearing, vision, cognitive, depression L. Referrals and appointments - none  In addition, I have reviewed and discussed with patient certain preventive protocols, quality metrics, and best practice recommendations. A written personalized care plan for preventive services as well as general preventive health recommendations were provided to patient.  See attached scanned questionnaire for additional information.   Signed,   Lindell Noe, MHA, BS, LPN Health Advisor

## 2016-05-15 NOTE — Progress Notes (Signed)
PCP notes:  Health maintenance:  Mammogram - appt scheduled 05/16/2016  Abnormal screenings: None  Patient concerns: Pt had multiple questions about supplements, ways to prevent chigger and mosquito bites, a hardened place on right eye lid, and heart healthy cooking oils.   Nurse concerns: None  Next PCP appt: None/pt had CPE in 02/2016

## 2016-05-15 NOTE — Progress Notes (Signed)
I reviewed health advisor's note, was available for consultation, and agree with documentation and plan.  No consultation requested for patient concerns.

## 2016-05-16 LAB — HM MAMMOGRAPHY

## 2016-05-19 ENCOUNTER — Encounter: Payer: Self-pay | Admitting: Family Medicine

## 2016-05-21 ENCOUNTER — Encounter: Payer: Self-pay | Admitting: *Deleted

## 2016-10-13 LAB — FECAL OCCULT BLOOD, GUAIAC: FECAL OCCULT BLD: NEGATIVE

## 2016-12-13 ENCOUNTER — Other Ambulatory Visit: Payer: Self-pay | Admitting: Family Medicine

## 2016-12-19 NOTE — Telephone Encounter (Signed)
Pt wants to know why did not get more refills on thyroid med; explained will update refills in April when come in for CPX. Pt wants to know if she is charged for refill request and I advised not unless comes in for visit; no chg to call a refill request. Pt voiced understanding.

## 2017-03-11 ENCOUNTER — Encounter: Payer: Medicare Other | Admitting: Family Medicine

## 2017-03-11 ENCOUNTER — Encounter: Payer: Self-pay | Admitting: Family Medicine

## 2017-03-11 ENCOUNTER — Ambulatory Visit (INDEPENDENT_AMBULATORY_CARE_PROVIDER_SITE_OTHER): Payer: Medicare Other | Admitting: Family Medicine

## 2017-03-11 VITALS — BP 98/62 | HR 74 | Temp 98.4°F | Ht 66.25 in | Wt 137.5 lb

## 2017-03-11 DIAGNOSIS — E7800 Pure hypercholesterolemia, unspecified: Secondary | ICD-10-CM | POA: Diagnosis not present

## 2017-03-11 DIAGNOSIS — Z0000 Encounter for general adult medical examination without abnormal findings: Secondary | ICD-10-CM

## 2017-03-11 DIAGNOSIS — M8589 Other specified disorders of bone density and structure, multiple sites: Secondary | ICD-10-CM | POA: Diagnosis not present

## 2017-03-11 DIAGNOSIS — E039 Hypothyroidism, unspecified: Secondary | ICD-10-CM

## 2017-03-11 DIAGNOSIS — E559 Vitamin D deficiency, unspecified: Secondary | ICD-10-CM | POA: Diagnosis not present

## 2017-03-11 DIAGNOSIS — M069 Rheumatoid arthritis, unspecified: Secondary | ICD-10-CM

## 2017-03-11 DIAGNOSIS — D72819 Decreased white blood cell count, unspecified: Secondary | ICD-10-CM | POA: Diagnosis not present

## 2017-03-11 DIAGNOSIS — D696 Thrombocytopenia, unspecified: Secondary | ICD-10-CM

## 2017-03-11 LAB — CBC WITH DIFFERENTIAL/PLATELET
BASOS ABS: 0 10*3/uL (ref 0.0–0.1)
Basophils Relative: 1.2 % (ref 0.0–3.0)
EOS ABS: 0.1 10*3/uL (ref 0.0–0.7)
Eosinophils Relative: 1.9 % (ref 0.0–5.0)
HCT: 36.1 % (ref 36.0–46.0)
Hemoglobin: 12.7 g/dL (ref 12.0–15.0)
LYMPHS ABS: 1.4 10*3/uL (ref 0.7–4.0)
Lymphocytes Relative: 39.4 % (ref 12.0–46.0)
MCHC: 35.1 g/dL (ref 30.0–36.0)
MCV: 92.4 fl (ref 78.0–100.0)
MONO ABS: 0.2 10*3/uL (ref 0.1–1.0)
Monocytes Relative: 6.1 % (ref 3.0–12.0)
NEUTROS PCT: 51.4 % (ref 43.0–77.0)
Neutro Abs: 1.8 10*3/uL (ref 1.4–7.7)
Platelets: 177 10*3/uL (ref 150.0–400.0)
RBC: 3.91 Mil/uL (ref 3.87–5.11)
RDW: 13.3 % (ref 11.5–15.5)
WBC: 3.6 10*3/uL — ABNORMAL LOW (ref 4.0–10.5)

## 2017-03-11 LAB — COMPREHENSIVE METABOLIC PANEL
ALBUMIN: 4.2 g/dL (ref 3.5–5.2)
ALK PHOS: 67 U/L (ref 39–117)
ALT: 11 U/L (ref 0–35)
AST: 18 U/L (ref 0–37)
BUN: 17 mg/dL (ref 6–23)
CO2: 31 mEq/L (ref 19–32)
Calcium: 9.4 mg/dL (ref 8.4–10.5)
Chloride: 105 mEq/L (ref 96–112)
Creatinine, Ser: 0.89 mg/dL (ref 0.40–1.20)
GFR: 66.07 mL/min (ref >60.00)
Glucose, Bld: 91 mg/dL (ref 70–99)
POTASSIUM: 4 meq/L (ref 3.5–5.1)
SODIUM: 140 meq/L (ref 135–145)
TOTAL PROTEIN: 6.9 g/dL (ref 6.0–8.3)
Total Bilirubin: 2 mg/dL — ABNORMAL HIGH (ref 0.2–1.2)

## 2017-03-11 LAB — VITAMIN D 25 HYDROXY (VIT D DEFICIENCY, FRACTURES): VITD: 50.13 ng/mL (ref 30.00–100.00)

## 2017-03-11 LAB — LIPID PANEL
CHOLESTEROL: 217 mg/dL — AB (ref 0–200)
HDL: 67.6 mg/dL (ref >39.00)
LDL Cholesterol: 134 mg/dL — ABNORMAL HIGH (ref 0–99)
NonHDL: 149.49
Total CHOL/HDL Ratio: 3
Triglycerides: 77 mg/dL (ref 0.0–149.0)
VLDL: 15.4 mg/dL (ref 0.0–40.0)

## 2017-03-11 LAB — TSH: TSH: 3.27 u[IU]/mL (ref 0.35–4.50)

## 2017-03-11 NOTE — Progress Notes (Signed)
Subjective:    Patient ID: Jennifer Sellers, female    DOB: 08-Dec-1945, 71 y.o.   MRN: [id number]  HPI Here for health maintenance exam and to review chronic medical problems    Doing fairly well overall  Trying to take care of herself  Some stressors   Has amw visit scheduled for 6/6    Wt Readings from Last 3 Encounters:  03/11/17 137 lb 8 oz (62.4 kg)  05/15/16 139 lb 8 oz (63.3 kg)  03/10/16 141 lb (64 kg)  down a few lb  She worries about getting flab around the middle  For exercise-- some Tai chi - and enjoyed it and loves to walk  bmi 22.0  Declines flu vaccines  Tetanus shot due (10 y) prevnar 6/16-severe local reaction  PCV 23 -opts not to get due to severe local rxn to prevnar   Mammogram 6/17 Self breast exam-no lumps or changes  Had a hysterectomy-nl symptoms (for fibroids)   Colonoscopy 3/14 - one polyp (5 year recall)  Nl ifob 10/16  dexa 2/16 osteopenia  No falls or fractures this year  She takes vit d -not taking ca  Due for vit D level   Due for lab work   Hx of hypothyroidism on 50 mcg levotx No cliniscl changes  Lab Results  Component Value Date   TSH 3.64 03/10/2016     Hx of RA-no change   Hx of thrombocytopenia and leukopenia in the past  Lab Results  Component Value Date   WBC 3.4 (L) 03/10/2016   HGB 13.2 03/10/2016   HCT 38.0 03/10/2016   MCV 91.0 03/10/2016   PLT 175.0 03/10/2016  no excessive bruising or bleeding    Hx of hyperlipidemia Diet controlled Lab Results  Component Value Date   CHOL 233 (H) 03/10/2016   HDL 61.70 03/10/2016   LDLCALC 152 (H) 03/10/2016   LDLDIRECT 163.4 11/11/2013   TRIG 97.0 03/10/2016   CHOLHDL 4 03/10/2016   Due for lab  Has had lipo science profile done in the past Diet is a bit better this time   Patient Active Problem List   Diagnosis Date Noted  . Vitamin D deficiency 08/03/2015  . Routine general medical examination at a health care facility 05/15/2015  . Dyspepsia 08/23/2014    . Abnormal ankle brachial index 08/23/2014  . Rheumatoid arthritis ([place]) 06/19/2014  . Thrombocytopenia ([place]) 06/19/2014  . Encounter for Medicare annual wellness exam 11/11/2013  . Hyperbilirubinemia 08/19/2013  . Hypothyroid 09/21/2012  . NEOPLASMS UNSPEC NATURE BONE SOFT TISSUE&SKIN 10/23/2010  . 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 OTHER SYMPTOMS REFERABLE JOINT SITE UNSPECIFIED 10/23/2010  . CARPAL TUNNEL SYNDROME, HX OF 04/05/2009  . Leukocytopenia 05/02/2008  . DRY MOUTH 04/28/2008  . INSOMNIA, TRANSIENT 04/27/2007  . Osteopenia 04/27/2007  . Hyperlipidemia 04/23/2007  . HIATAL HERNIA 04/23/2007  . POSTMENOPAUSAL STATUS 04/23/2007  . RHEUMATOID FACTOR, POSITIVE 04/23/2007   Past Medical History:  Diagnosis Date  . Arthritis    rheumatoid arthritis  . Leukopenia   . Thyroid disease    Past Surgical History:  Procedure Laterality Date  . ABDOMINAL HYSTERECTOMY    . CARPAL TUNNEL RELEASE     Social History  Substance Use Topics  . Smoking status: Never Smoker  . Smokeless tobacco: Never Used  . Alcohol use 0.0 oz/week     Comment: occasional   Family History  Problem Relation Age of Onset  . Cancer Mother     lymphoma  .  Cancer Brother     lymphoma  . Cancer Daughter     NHL   Allergies  Allergen Reactions  . Cortisporin [Bacitra-Neomycin-Polymyxin-Hc]     Burning / swelling of eyes with eye drops  . Prevnar [Pneumococcal 13-Val Conj Vacc] Other (See Comments)    Severe local reaction   . Sulfonamide Derivatives     REACTION: reaction  not known   Current Outpatient Prescriptions on File Prior to Visit  Medication Sig Dispense Refill  . levothyroxine (SYNTHROID, LEVOTHROID) 50 MCG tablet TAKE 1 TABLET DAILY AND TAKE 2 TABLETS ON SUNDAY 104 tablet 0  . NON FORMULARY 2 drops daily. Vista  2    . NON FORMULARY Take 4 tablets by mouth daily. Total FLM:    . NON FORMULARY Take 1 tablet by mouth daily.    Marland Kitchen VITAMIN D, CHOLECALCIFEROL, PO Take 5,000 Units by mouth  daily.     No current facility-administered medications on file prior to visit.     Review of Systems Review of Systems  Constitutional: Negative for fever, appetite change, fatigue and unexpected weight change.  Eyes: Negative for pain and visual disturbance.  Respiratory: Negative for cough and shortness of breath.   Cardiovascular: Negative for cp or palpitations    Gastrointestinal: Negative for nausea, diarrhea and constipation.  Genitourinary: Negative for urgency and frequency.  Skin: Negative for pallor or rash   Neurological: Negative for weakness, light-headedness, numbness and headaches.  Hematological: Negative for adenopathy. Does not bruise/bleed easily.  Psychiatric/Behavioral: Negative for dysphoric mood. The patient is not nervous/anxious.         Objective:   Physical Exam  Constitutional: She appears well-developed and well-nourished. No distress.  Well appearing   HENT:  Head: Normocephalic and atraumatic.  Right Ear: External ear normal.  Left Ear: External ear normal.  Mouth/Throat: Oropharynx is clear and moist.  Eyes: Conjunctivae and EOM are normal. Pupils are equal, round, and reactive to light. No scleral icterus.  Neck: Normal range of motion. Neck supple. No JVD present. Carotid bruit is not present. No thyromegaly present.  Cardiovascular: Normal rate, regular rhythm, normal heart sounds and intact distal pulses.  Exam reveals no gallop.   Pulmonary/Chest: Effort normal and breath sounds normal. No respiratory distress. She has no wheezes. She exhibits no tenderness.  Abdominal: Soft. Bowel sounds are normal. She exhibits no distension, no abdominal bruit and no mass. There is no tenderness.  Genitourinary: No breast swelling, tenderness, discharge or bleeding.  Genitourinary Comments: Breast exam: No mass, nodules, thickening, tenderness, bulging, retraction, inflamation, nipple discharge or skin changes noted.  No axillary or clavicular LA.        Musculoskeletal: Normal range of motion. She exhibits no edema or tenderness.  Lymphadenopathy:    She has no cervical adenopathy.  Neurological: She is alert. She has normal reflexes. No cranial nerve deficit. She exhibits normal muscle tone. Coordination normal.  Skin: Skin is warm and dry. No rash noted. No erythema. No pallor.  Lentigines diffusely Some SKs  Psychiatric: She has a normal mood and affect.          Assessment & Plan:   Problem List Items Addressed This Visit      Endocrine   Hypothyroid - Primary    TSH today  Will adj levothyroxine if needed  No clinical changes         Musculoskeletal and Integument   Osteopenia    Rev dexa 2/16 No falls or fx Disc need for  calcium/ vitamin D/ wt bearing exercise and bone density test every 2 y to monitor Disc safety/ fracture risk in detail        Rheumatoid arthritis [facility])    Rheumatology following         Other   Hyperlipidemia    Due for lipid profile Disc goals for lipids and reasons to control them Rev labs with pt (last check) Has had lipo science profile in the past Rev low sat fat diet in detail       Leukocytopenia    Lab today  No clinical changes       Routine general medical examination at a health care facility    Reviewed health habits including diet and exercise and skin cancer prevention Reviewed appropriate screening tests for age  Also reviewed health mt list, fam hx and immunization status , as well as social and family history   See HPI Labs ordered  AMW is scheduled for June  Declines flu vaccines  Info given re: getting tetanus shot at a pharmacy Not giving her pna 23 based on severe local reaction to prevnar vaccine  Rev last dexa  And enc to get back on calcium        Relevant Orders   CBC with Differential/Platelet (Completed)   Lipid panel (Completed)   Comprehensive metabolic panel (Completed)   TSH (Completed)   Thrombocytopenia ([place])    Lab today  Has seen  hematology Monitored No excessive bleeding or bruising       Vitamin D deficiency    Vit D level today  Disc imp to bone and overall health      Relevant Orders   VITAMIN D 25 Hydroxy (Vit-D Deficiency, Fractures) (Completed)

## 2017-03-11 NOTE — Assessment & Plan Note (Signed)
TSH today  Will adj levothyroxine if needed  No clinical changes

## 2017-03-11 NOTE — Patient Instructions (Addendum)
You are due for a 10 year tetanus shot - see the handout I gave you   Get back on your calcium  Take your vitamin D   Labs today   Stay active and take care of yourself   The new shingles vaccine is called Shingrix  If you are interested in a shingles/zoster vaccine - call your insurance to check on coverage,( you should not get it within 1 month of other vaccines) , then call us for a prescription  for it to take to a pharmacy that gives the shot , or make a nurse visit to get it here depending on your coverage

## 2017-03-11 NOTE — Progress Notes (Signed)
Pre visit review using our clinic review tool, if applicable. No additional management support is needed unless otherwise documented below in the visit note. 

## 2017-03-12 ENCOUNTER — Encounter: Payer: Self-pay | Admitting: *Deleted

## 2017-03-12 NOTE — Assessment & Plan Note (Signed)
Lab today  Has seen hematology Monitored No excessive bleeding or bruising

## 2017-03-12 NOTE — Assessment & Plan Note (Signed)
Lab today No clinical changes  

## 2017-03-12 NOTE — Assessment & Plan Note (Signed)
Rev dexa 2/16 No falls or fx Disc need for calcium/ vitamin D/ wt bearing exercise and bone density test every 2 y to monitor Disc safety/ fracture risk in detail

## 2017-03-12 NOTE — Assessment & Plan Note (Signed)
Due for lipid profile Disc goals for lipids and reasons to control them Rev labs with pt (last check) Has had lipo science profile in the past Rev low sat fat diet in detail

## 2017-03-12 NOTE — Assessment & Plan Note (Addendum)
Reviewed health habits including diet and exercise and skin cancer prevention Reviewed appropriate screening tests for age  Also reviewed health mt list, fam hx and immunization status , as well as social and family history   See HPI Labs ordered  AMW is scheduled for June  Declines flu vaccines  Info given re: getting tetanus shot at a pharmacy Not giving her pna 23 based on severe local reaction to prevnar vaccine  Rev last dexa  And enc to get back on calcium

## 2017-03-12 NOTE — Assessment & Plan Note (Signed)
Vit D level today  Disc imp to bone and overall health

## 2017-03-12 NOTE — Assessment & Plan Note (Signed)
Rheumatology following

## 2017-03-13 ENCOUNTER — Other Ambulatory Visit: Payer: Self-pay | Admitting: Family Medicine

## 2017-04-22 ENCOUNTER — Ambulatory Visit (INDEPENDENT_AMBULATORY_CARE_PROVIDER_SITE_OTHER): Payer: Medicare Other

## 2017-04-22 VITALS — BP 92/70 | HR 70 | Temp 98.5°F | Ht 66.5 in | Wt 138.5 lb

## 2017-04-22 DIAGNOSIS — Z0000 Encounter for general adult medical examination without abnormal findings: Secondary | ICD-10-CM

## 2017-04-22 NOTE — Progress Notes (Signed)
PCP notes:   Health maintenance:  Tetanus - postponed/insurance  Abnormal screenings:   None  Patient concerns:   None  Nurse concerns:  Discussed with patient the signs and symptoms of low blood pressure. Encouraged patient to continue to monitor BP at home and contact PCP if there are any concerns.   Next PCP appt:   March 2019  I reviewed health advisor's note, was available for consultation, and agree with documentation and plan. Loura Pardon MD

## 2017-04-22 NOTE — Progress Notes (Signed)
Subjective:   Jennifer Sellers is a 71 y.o. female who presents for Medicare Annual (Subsequent) preventive examination.  Review of Systems:  N/A Cardiac Risk Factors include: advanced age (>28men, >75 women);dyslipidemia     Objective:     Vitals: BP 92/70 (BP Location: Right Arm, Patient Position: Sitting, Cuff Size: Normal)   Pulse 70   Temp 98.5 F (36.9 C) (Oral)   Ht 5' 6.5" (1.689 m) Comment: no shoes  Wt 138 lb 8 oz (62.8 kg)   SpO2 98%   BMI 22.02 kg/m   Body mass index is 22.02 kg/m.   Tobacco History  Smoking Status  . Never Smoker  Smokeless Tobacco  . Never Used     Counseling given: No   Past Medical History:  Diagnosis Date  . Arthritis    rheumatoid arthritis  . Leukopenia   . Thyroid disease    Past Surgical History:  Procedure Laterality Date  . ABDOMINAL HYSTERECTOMY    . CARPAL TUNNEL RELEASE     Family History  Problem Relation Age of Onset  . Cancer Mother        lymphoma  . Cancer Brother        lymphoma  . Cancer Daughter        NHL   History  Sexual Activity  . Sexual activity: Yes    Outpatient Encounter Prescriptions as of 04/22/2017  Medication Sig  . levothyroxine (SYNTHROID, LEVOTHROID) 50 MCG tablet TAKE 1 TABLET DAILY AND TAKE 2 TABLETS ON SUNDAY  . NON FORMULARY 2 drops daily. Vista  2  . NON FORMULARY Take 4 tablets by mouth daily. Total FLM:  . NON FORMULARY Take 1 tablet by mouth daily.  Marland Kitchen VITAMIN D, CHOLECALCIFEROL, PO Take 5,000 Units by mouth daily.   No facility-administered encounter medications on file as of 04/22/2017.     Activities of Daily Living In your present state of health, do you have any difficulty performing the following activities: 04/22/2017 05/15/2016  Hearing? N N  Vision? N N  Difficulty concentrating or making decisions? N N  Walking or climbing stairs? N N  Dressing or bathing? N N  Doing errands, shopping? N N  Preparing Food and eating ? N N  Using the Toilet? N N  In the past  six months, have you accidently leaked urine? N N  Do you have problems with loss of bowel control? N N  Managing your Medications? N N  Managing your Finances? N N  Housekeeping or managing your Housekeeping? N N  Some recent data might be hidden    Patient Care Team: Tower, Wynelle Fanny, MD as PCP - General Lad, Caesar Bookman, MD as Referring Physician (Ophthalmology) Birder Robson, MD as Referring Physician (Ophthalmology) Worthy Keeler, DC as Consulting Physician (Chiropractic Medicine)    Assessment:     Hearing Screening   125Hz  250Hz  500Hz  1000Hz  2000Hz  3000Hz  4000Hz  6000Hz  8000Hz   Right ear:   40 40 40  40    Left ear:   40 40 40  40    Vision Screening Comments: Last vision exam in Jan 2018 with Dr. Rosine Door with [facility]   Exercise Activities and Dietary recommendations Current Exercise Habits: The patient does not participate in regular exercise at present, Exercise limited by: None identified  Goals    . Increase water intake          Starting 04/22/2017, I will continue to drink at least 6 glasses of water  daily.       Fall Risk Fall Risk  04/22/2017 05/15/2016 05/15/2015 11/13/2014 11/11/2013  Falls in the past year? No No No No No   Depression Screen PHQ 2/9 Scores 04/22/2017 05/15/2016 05/15/2015 11/13/2014  PHQ - 2 Score 0 0 0 0     Cognitive Function MMSE - Mini Mental State Exam 04/22/2017 05/15/2016  Orientation to time 5 5  Orientation to Place 5 5  Registration 3 3  Attention/ Calculation 0 0  Recall 3 3  Language- name 2 objects 0 0  Language- repeat 1 1  Language- follow 3 step command 3 3  Language- read & follow direction 0 0  Write a sentence 0 0  Copy design 0 0  Total score 20 20     PLEASE NOTE: A Mini-Cog screen was completed. Maximum score is 20. A value of 0 denotes this part of Folstein MMSE was not completed or the patient failed this part of the Mini-Cog screening.   Mini-Cog Screening Orientation to Time - Max 5 pts Orientation  to Place - Max 5 pts Registration - Max 3 pts Recall - Max 3 pts Language Repeat - Max 1 pts Language Follow 3 Step Command - Max 3 pts     Immunization History  Administered Date(s) Administered  . Pneumococcal Conjugate-13 05/15/2015  . Td 04/06/2007   Screening Tests Health Maintenance  Topic Date Due  . INFLUENZA VACCINE  11/11/2018 (Originally 06/17/2017)  . TETANUS/TDAP  04/05/2027 (Originally 04/05/2017)  . MAMMOGRAM  05/16/2017  . COLONOSCOPY  02/04/2023  . DEXA SCAN  Completed  . Hepatitis C Screening  Completed      Plan:     I have personally reviewed and addressed the Medicare Annual Wellness questionnaire and have noted the following in the patient's chart:  A. Medical and social history B. Use of alcohol, tobacco or illicit drugs  C. Current medications and supplements D. Functional ability and status E.  Nutritional status F.  Physical activity G. Advance directives H. List of other physicians I.  Hospitalizations, surgeries, and ER visits in previous 12 months J.  [place] to include hearing, vision, cognitive, depression L. Referrals and appointments - none  In addition, I have reviewed and discussed with patient certain preventive protocols, quality metrics, and best practice recommendations. A written personalized care plan for preventive services as well as general preventive health recommendations were provided to patient.  See attached scanned questionnaire for additional information.   Signed,   Lindell Noe, MHA, BS, LPN Health Coach

## 2017-04-22 NOTE — Patient Instructions (Signed)
Jennifer Sellers , Thank you for taking time to come for your Medicare Wellness Visit. I appreciate your ongoing commitment to your health goals. Please review the following plan we discussed and let me know if I can assist you in the future.   These are the goals we discussed: Goals    . Increase water intake          Starting 04/22/2017, I will continue to drink at least 6 glasses of water daily.        This is a list of the screening recommended for you and due dates:  Health Maintenance  Topic Date Due  . Flu Shot  11/11/2018*  . Tetanus Vaccine  04/05/2027*  . Mammogram  05/16/2017  . Colon Cancer Screening  02/04/2023  . DEXA scan (bone density measurement)  Completed  .  Hepatitis C: One time screening is recommended by Center for Disease Control  (CDC) for  adults born from [birth year] through 1967.   Completed  *Topic was postponed. The date shown is not the original due date.   Preventive Care for Adults  A healthy lifestyle and preventive care can promote health and wellness. Preventive health guidelines for adults include the following key practices.  . A routine yearly physical is a good way to check with your health care provider about your health and preventive screening. It is a chance to share any concerns and updates on your health and to receive a thorough exam.  . Visit your dentist for a routine exam and preventive care every 6 months. Brush your teeth twice a day and floss once a day. Good oral hygiene prevents tooth decay and gum disease.  . The frequency of eye exams is based on your age, health, family medical history, use  of contact lenses, and other factors. Follow your health care provider's ecommendations for frequency of eye exams.  . Eat a healthy diet. Foods like vegetables, fruits, whole grains, low-fat dairy products, and lean protein foods contain the nutrients you need without too many calories. Decrease your intake of foods high in solid fats, added sugars, and  salt. Eat the right amount of calories for you. Get information about a proper diet from your health care provider, if necessary.  . Regular physical exercise is one of the most important things you can do for your health. Most adults should get at least 150 minutes of moderate-intensity exercise (any activity that increases your heart rate and causes you to sweat) each week. In addition, most adults need muscle-strengthening exercises on 2 or more days a week.  Silver Sneakers may be a benefit available to you. To determine eligibility, you may visit the website: www.silversneakers.com or contact program at 978-751-4668 Mon-Fri between 8AM-8PM.   . Maintain a healthy weight. The body mass index (BMI) is a screening tool to identify possible weight problems. It provides an estimate of body fat based on height and weight. Your health care provider can find your BMI and can help you achieve or maintain a healthy weight.   For adults 20 years and older: ? A BMI below 18.5 is considered underweight. ? A BMI of 18.5 to 24.9 is normal. ? A BMI of 25 to 29.9 is considered overweight. ? A BMI of 30 and above is considered obese.   . Maintain normal blood lipids and cholesterol levels by exercising and minimizing your intake of saturated fat. Eat a balanced diet with plenty of fruit and vegetables. Blood tests for lipids and  cholesterol should begin at age 71 and be repeated every 5 years. If your lipid or cholesterol levels are high, you are over 50, or you are at high risk for heart disease, you may need your cholesterol levels checked more frequently. Ongoing high lipid and cholesterol levels should be treated with medicines if diet and exercise are not working.  . If you smoke, find out from your health care provider how to quit. If you do not use tobacco, please do not start.  . If you choose to drink alcohol, please do not consume more than 2 drinks per day. One drink is considered to be 12 ounces  (355 mL) of beer, 5 ounces (148 mL) of wine, or 1.5 ounces (44 mL) of liquor.  . If you are 68-48 years old, ask your health care provider if you should take aspirin to prevent strokes.  . Use sunscreen. Apply sunscreen liberally and repeatedly throughout the day. You should seek shade when your shadow is shorter than you. Protect yourself by wearing [place] sleeves, pants, a wide-brimmed hat, and sunglasses year round, whenever you are outdoors.  . Once a month, do a whole body skin exam, using a mirror to look at the skin on your back. Tell your health care provider of new moles, moles that have irregular borders, moles that are larger than a pencil eraser, or moles that have changed in shape or color.

## 2017-04-22 NOTE — Progress Notes (Signed)
Pre visit review using our clinic review tool, if applicable. No additional management support is needed unless otherwise documented below in the visit note. 

## 2017-04-29 ENCOUNTER — Telehealth: Payer: Self-pay

## 2017-04-29 NOTE — Telephone Encounter (Signed)
Pt seen 02/2017 and pt cannot find handout for tetanus shot; pt wants to know which tetanus shot she should get; pt is around children but not all the time. Pt wants to know if she needs Td or Tdap.Please advise.

## 2017-04-29 NOTE — Telephone Encounter (Signed)
Tdap is preferable if she is around children (especially babies)

## 2017-04-30 NOTE — Telephone Encounter (Signed)
Pt notified of Dr. Marliss Coots comments, pt said she isn't around children a lot so she is going to get the regular Td vaccine, I asked pt to let us know or have the place that gave her the vaccine alert [place] when she gets it so we can update her chart

## 2017-05-26 ENCOUNTER — Ambulatory Visit: Payer: Medicare Other

## 2017-06-03 ENCOUNTER — Encounter: Payer: Self-pay | Admitting: Family Medicine

## 2017-06-03 LAB — HM DEXA SCAN

## 2017-06-10 ENCOUNTER — Encounter: Payer: Self-pay | Admitting: Family Medicine

## 2017-06-16 ENCOUNTER — Telehealth: Payer: Self-pay | Admitting: Family Medicine

## 2017-06-16 NOTE — Telephone Encounter (Signed)
Addressed DEXA results and Dr. Marliss Coots comments and letter mailed to pt with results per pt request

## 2017-06-16 NOTE — Telephone Encounter (Signed)
Patient returned Jennifer Sellers's call about her Dexa Scan results.

## 2018-01-26 ENCOUNTER — Ambulatory Visit: Payer: Medicare Other

## 2018-01-28 ENCOUNTER — Ambulatory Visit (INDEPENDENT_AMBULATORY_CARE_PROVIDER_SITE_OTHER): Payer: Medicare Other

## 2018-01-28 VITALS — BP 98/78 | HR 68 | Temp 98.5°F | Ht 66.5 in | Wt 136.5 lb

## 2018-01-28 DIAGNOSIS — E559 Vitamin D deficiency, unspecified: Secondary | ICD-10-CM | POA: Diagnosis not present

## 2018-01-28 DIAGNOSIS — D72819 Decreased white blood cell count, unspecified: Secondary | ICD-10-CM | POA: Diagnosis not present

## 2018-01-28 DIAGNOSIS — E038 Other specified hypothyroidism: Secondary | ICD-10-CM

## 2018-01-28 DIAGNOSIS — E7849 Other hyperlipidemia: Secondary | ICD-10-CM

## 2018-01-28 DIAGNOSIS — M8580 Other specified disorders of bone density and structure, unspecified site: Secondary | ICD-10-CM

## 2018-01-28 DIAGNOSIS — Z0000 Encounter for general adult medical examination without abnormal findings: Secondary | ICD-10-CM | POA: Diagnosis not present

## 2018-01-28 LAB — COMPREHENSIVE METABOLIC PANEL
ALBUMIN: 4.1 g/dL (ref 3.5–5.2)
ALK PHOS: 71 U/L (ref 39–117)
ALT: 12 U/L (ref 0–35)
AST: 16 U/L (ref 0–37)
BUN: 13 mg/dL (ref 6–23)
CO2: 32 mEq/L (ref 19–32)
Calcium: 9.1 mg/dL (ref 8.4–10.5)
Chloride: 105 mEq/L (ref 96–112)
Creatinine, Ser: 0.96 mg/dL (ref 0.40–1.20)
GFR: 60.4 mL/min (ref >60.00)
GLUCOSE: 90 mg/dL (ref 70–99)
Potassium: 3.9 mEq/L (ref 3.5–5.1)
SODIUM: 141 meq/L (ref 135–145)
TOTAL PROTEIN: 6.7 g/dL (ref 6.0–8.3)
Total Bilirubin: 1.7 mg/dL — ABNORMAL HIGH (ref 0.2–1.2)

## 2018-01-28 LAB — CBC WITH DIFFERENTIAL/PLATELET
BASOS ABS: 0 10*3/uL (ref 0.0–0.1)
Basophils Relative: 1.3 % (ref 0.0–3.0)
EOS PCT: 2.4 % (ref 0.0–5.0)
Eosinophils Absolute: 0.1 10*3/uL (ref 0.0–0.7)
HCT: 37 % (ref 36.0–46.0)
Hemoglobin: 12.7 g/dL (ref 12.0–15.0)
LYMPHS ABS: 0.9 10*3/uL (ref 0.7–4.0)
Lymphocytes Relative: 33.7 % (ref 12.0–46.0)
MCHC: 34.2 g/dL (ref 30.0–36.0)
MCV: 93.6 fl (ref 78.0–100.0)
Monocytes Absolute: 0.2 10*3/uL (ref 0.1–1.0)
Monocytes Relative: 5.9 % (ref 3.0–12.0)
NEUTROS ABS: 1.5 10*3/uL (ref 1.4–7.7)
NEUTROS PCT: 56.7 % (ref 43.0–77.0)
PLATELETS: 169 10*3/uL (ref 150.0–400.0)
RBC: 3.95 Mil/uL (ref 3.87–5.11)
RDW: 12.9 % (ref 11.5–15.5)
WBC: 2.6 10*3/uL — ABNORMAL LOW (ref 4.0–10.5)

## 2018-01-28 LAB — LIPID PANEL
Cholesterol: 203 mg/dL — ABNORMAL HIGH (ref 0–200)
HDL: 67.7 mg/dL (ref >39.00)
LDL Cholesterol: 125 mg/dL — ABNORMAL HIGH (ref 0–99)
NonHDL: 135.69
Total CHOL/HDL Ratio: 3
Triglycerides: 55 mg/dL (ref 0.0–149.0)
VLDL: 11 mg/dL (ref 0.0–40.0)

## 2018-01-28 LAB — TSH: TSH: 4.55 u[IU]/mL — ABNORMAL HIGH (ref 0.35–4.50)

## 2018-01-28 LAB — VITAMIN D 25 HYDROXY (VIT D DEFICIENCY, FRACTURES): VITD: 48.96 ng/mL (ref 30.00–100.00)

## 2018-01-28 NOTE — Patient Instructions (Signed)
Jennifer Sellers , Thank you for taking time to come for your Medicare Wellness Visit. I appreciate your ongoing commitment to your health goals. Please review the following plan we discussed and let me know if I can assist you in the future.   These are the goals we discussed: Goals    . Increase physical activity     When schedule permits, I will attempt to walk for 15 minutes 4 days per week.        This is a list of the screening recommended for you and due dates:  Health Maintenance  Topic Date Due  . Flu Shot  11/11/2018*  . Tetanus Vaccine  04/05/2027*  . Mammogram  06/03/2018  . Colon Cancer Screening  02/04/2023  . DEXA scan (bone density measurement)  Completed  .  Hepatitis C: One time screening is recommended by Center for Disease Control  (CDC) for  adults born from [birth year] through 1967.   Completed  *Topic was postponed. The date shown is not the original due date.   Preventive Care for Adults  A healthy lifestyle and preventive care can promote health and wellness. Preventive health guidelines for adults include the following key practices.  . A routine yearly physical is a good way to check with your health care provider about your health and preventive screening. It is a chance to share any concerns and updates on your health and to receive a thorough exam.  . Visit your dentist for a routine exam and preventive care every 6 months. Brush your teeth twice a day and floss once a day. Good oral hygiene prevents tooth decay and gum disease.  . The frequency of eye exams is based on your age, health, family medical history, use  of contact lenses, and other factors. Follow your health care provider's recommendations for frequency of eye exams.  . Eat a healthy diet. Foods like vegetables, fruits, whole grains, low-fat dairy products, and lean protein foods contain the nutrients you need without too many calories. Decrease your intake of foods high in solid fats, added sugars, and  salt. Eat the right amount of calories for you. Get information about a proper diet from your health care provider, if necessary.  . Regular physical exercise is one of the most important things you can do for your health. Most adults should get at least 150 minutes of moderate-intensity exercise (any activity that increases your heart rate and causes you to sweat) each week. In addition, most adults need muscle-strengthening exercises on 2 or more days a week.  Silver Sneakers may be a benefit available to you. To determine eligibility, you may visit the website: www.silversneakers.com or contact program at (253) 065-4899 Mon-Fri between 8AM-8PM.   . Maintain a healthy weight. The body mass index (BMI) is a screening tool to identify possible weight problems. It provides an estimate of body fat based on height and weight. Your health care provider can find your BMI and can help you achieve or maintain a healthy weight.   For adults 20 years and older: ? A BMI below 18.5 is considered underweight. ? A BMI of 18.5 to 24.9 is normal. ? A BMI of 25 to 29.9 is considered overweight. ? A BMI of 30 and above is considered obese.   . Maintain normal blood lipids and cholesterol levels by exercising and minimizing your intake of saturated fat. Eat a balanced diet with plenty of fruit and vegetables. Blood tests for lipids and cholesterol should begin at  age 71 and be repeated every 5 years. If your lipid or cholesterol levels are high, you are over 50, or you are at high risk for heart disease, you may need your cholesterol levels checked more frequently. Ongoing high lipid and cholesterol levels should be treated with medicines if diet and exercise are not working.  . If you smoke, find out from your health care provider how to quit. If you do not use tobacco, please do not start.  . If you choose to drink alcohol, please do not consume more than 2 drinks per day. One drink is considered to be 12 ounces  (355 mL) of beer, 5 ounces (148 mL) of wine, or 1.5 ounces (44 mL) of liquor.  . If you are 38-58 years old, ask your health care provider if you should take aspirin to prevent strokes.  . Use sunscreen. Apply sunscreen liberally and repeatedly throughout the day. You should seek shade when your shadow is shorter than you. Protect yourself by wearing Fr[place] sleeves, pants, a wide-brimmed hat, and sunglasses year round, whenever you are outdoors.  . Once a month, do a whole body skin exam, using a mirror to look at the skin on your back. Tell your health care provider of new moles, moles that have irregular borders, moles that are larger than a pencil eraser, or moles that have changed in shape or color.

## 2018-01-28 NOTE — Progress Notes (Signed)
Subjective:   Jennifer Sellers is a 72 y.o. female who presents for Medicare Annual (Subsequent) preventive examination.  Review of Systems:  N/A Cardiac Risk Factors include: advanced age (>21mn, >>38women);dyslipidemia     Objective:     Vitals: BP 98/78 (BP Location: Right Arm, Patient Position: Sitting, Cuff Size: Normal)   Pulse 68   Temp 98.5 F (36.9 C) (Oral)   Ht 5' 6.5" (1.689 m) Comment: no shoes  Wt 136 lb 8 oz (61.9 kg)   SpO2 98%   BMI 21.70 kg/m   Body mass index is 21.7 kg/m.  Advanced Directives 01/28/2018 04/22/2017 05/15/2016  Does Patient Have a Medical Advance Directive? Yes Yes Yes  Type of AParamedicof AAllgoodLiving will HPickeringLiving will HBig FlatLiving will  Does patient want to make changes to medical advance directive? - - No - Patient declined  Copy of [place] Chart? No - copy requested No - copy requested No - copy requested    Tobacco Social History   Tobacco Use  Smoking Status Never Smoker  Smokeless Tobacco Never Used     Counseling given: No   Clinical Intake:  Pre-visit preparation completed: Yes  Pain : No/denies pain Pain Score: 0-No pain     Nutritional Status: BMI of 19-24  Normal Nutritional Risks: None Diabetes: No  How often do you need to have someone help you when you read instructions, pamphlets, or other written materials from your doctor or pharmacy?: 1 - Never What is the last grade level you completed in school?: Bachelor degree  Interpreter Needed?: No  Comments: pt lives with spouse Information entered by :: LPinson, LPN  Past Medical History:  Diagnosis Date  . Arthritis    rheumatoid arthritis  . Leukopenia   . Thyroid disease    Past Surgical History:  Procedure Laterality Date  . ABDOMINAL HYSTERECTOMY    . CARPAL TUNNEL RELEASE     Family History  Problem Relation Age of Onset  . Cancer Mother        lymphoma  . Cancer Brother        lymphoma  . Cancer Daughter        NHL   Social History   Socioeconomic History  . Marital status: Married    Spouse name: None  . Number of children: None  . Years of education: None  . Highest education level: None  Social Needs  . Financial resource strain: None  . Food insecurity - worry: None  . Food insecurity - inability: None  . Transportation needs - medical: None  . Transportation needs - non-medical: None  Occupational History  . None  Tobacco Use  . Smoking status: Never Smoker  . Smokeless tobacco: Never Used  Substance and Sexual Activity  . Alcohol use: Yes    Alcohol/week: 0.0 oz    Comment: occasional  . Drug use: No  . Sexual activity: Yes  Other Topics Concern  . None  Social History Narrative  . None    Outpatient Encounter Medications as of 01/28/2018  Medication Sig  . levothyroxine (SYNTHROID, LEVOTHROID) 50 MCG tablet TAKE 1 TABLET DAILY AND TAKE 2 TABLETS ON SUNDAY  . Multiple Vitamins-Minerals (EYE VITAMINS PO) Take by mouth.  . NON FORMULARY 2 drops daily. [place]  2  . NON FORMULARY   . VITAMIN D, CHOLECALCIFEROL, PO Take 5,000 Units by mouth daily.  . [DISCONTINUED] NON FORMULARY  Take 4 tablets by mouth daily. Total FLM:  . [DISCONTINUED] NON FORMULARY Take 1 tablet by mouth daily.   No facility-administered encounter medications on file as of 01/28/2018.     Activities of Daily Living In your present state of health, do you have any difficulty performing the following activities: 01/28/2018 04/22/2017  Hearing? N N  Vision? N N  Difficulty concentrating or making decisions? N N  Walking or climbing stairs? N N  Dressing or bathing? N N  Doing errands, shopping? N N  Preparing Food and eating ? N N  Using the Toilet? N N  In the past six months, have you accidently leaked urine? N N  Do you have problems with loss of bowel control? N N  Managing your Medications? N N  Managing your Finances? N N   Housekeeping or managing your Housekeeping? N N  Some recent data might be hidden    Patient Care Team: Tower, Marne A, MD as PCP - General Lad, Eleonora Georgeta, MD as Referring Physician (Ophthalmology) Porfilio, William, MD as Referring Physician (Ophthalmology) Joanne Noel, DC as Consulting Physician (Chiropractic Medicine)    Assessment:   This is a routine wellness examination for Jennifer Sellers.   Hearing Screening   125Hz 250Hz 500Hz 1000Hz 2000Hz 3000Hz 4000Hz 6000Hz 8000Hz  Right ear:   40 40 40  40    Left ear:   40 40 40  40    Vision Screening Comments: Last vision exam in 2019 with Dr. Lad/[facility]  Exercise Activities and Dietary recommendations Current Exercise Habits: Home exercise routine, Type of exercise: Other - see comments(Tai Chi), Time (Minutes): 60, Frequency (Times/Week): 1, Weekly Exercise (Minutes/Week): 60, Intensity: Mild, Exercise limited by: None identified  Goals    . Increase physical activity     When schedule permits, I will attempt to walk for 15 minutes 4 days per week.        Fall Risk Fall Risk  01/28/2018 04/22/2017 05/15/2016 05/15/2015 11/13/2014  Falls in the past year? No No No No No   Depression Screen PHQ 2/9 Scores 01/28/2018 04/22/2017 05/15/2016 05/15/2015  PHQ - 2 Score 0 0 0 0  PHQ- 9 Score 0 - - -     Cognitive Function MMSE - Mini Mental State Exam 01/28/2018 04/22/2017 05/15/2016  Orientation to time 5 5 5  Orientation to Place 5 5 5  Registration 3 3 3  Attention/ Calculation 0 0 0  Recall 3 3 3  Language- name 2 objects 0 0 0  Language- repeat 1 1 1  Language- follow 3 step command 3 3 3  Language- read & follow direction 0 0 0  Write a sentence 0 0 0  Copy design 0 0 0  Total score 20 20 20     PLEASE NOTE: A Mini-Cog screen was completed. Maximum score is 20. A value of 0 denotes this part of Folstein MMSE was not completed or the patient failed this part of the Mini-Cog screening.   Mini-Cog  Screening Orientation to Time - Max 5 pts Orientation to Place - Max 5 pts Registration - Max 3 pts Recall - Max 3 pts Language Repeat - Max 1 pts Language Follow 3 Step Command - Max 3 pts    Immunization History  Administered Date(s) Administered  . Pneumococcal Conjugate-13 05/15/2015  . Td 04/06/2007   Screening Tests Health Maintenance  Topic Date Due  . INFLUENZA VACCINE  11/11/2018 (Originally 06/17/2017)  . TETANUS/TDAP    04/05/2027 (Originally 04/05/2017)  . MAMMOGRAM  06/03/2018  . COLONOSCOPY  02/04/2023  . DEXA SCAN  Completed  . Hepatitis C Screening  Completed     Plan:     I have personally reviewed, addressed, and noted the following in the patient's chart:  A. Medical and social history B. Use of alcohol, tobacco or illicit drugs  C. Current medications and supplements D. Functional ability and status E.  Nutritional status F.  Physical activity G. Advance directives H. List of other physicians I.  Hospitalizations, surgeries, and ER visits in previous 12 months J.  Vitals K. Screenings to include hearing, vision, cognitive, depression L. Referrals and appointments - none  In addition, I have reviewed and discussed with patient certain preventive protocols, quality metrics, and best practice recommendations. A written personalized care plan for preventive services as well as general preventive health recommendations were provided to patient.  See attached scanned questionnaire for additional information.   Signed,   Lesia , MHA, BS, LPN Health Coach  

## 2018-01-28 NOTE — Progress Notes (Signed)
PCP notes:   Health maintenance:  No gaps identified.   Abnormal screenings:   None  Patient concerns:   Patient wants to discuss brown spots and scaly areas on lower extremities.   Nurse concerns:  None  Next PCP appt:  ' 02/02/18 @ 0830

## 2018-01-31 NOTE — Progress Notes (Signed)
I reviewed health advisor's note, was available for consultation, and agree with documentation and plan.  

## 2018-02-02 ENCOUNTER — Ambulatory Visit (INDEPENDENT_AMBULATORY_CARE_PROVIDER_SITE_OTHER): Payer: Medicare Other | Admitting: Family Medicine

## 2018-02-02 ENCOUNTER — Encounter: Payer: Self-pay | Admitting: Family Medicine

## 2018-02-02 VITALS — BP 122/70 | HR 72 | Temp 98.4°F | Ht 66.5 in | Wt 137.5 lb

## 2018-02-02 DIAGNOSIS — M069 Rheumatoid arthritis, unspecified: Secondary | ICD-10-CM | POA: Diagnosis not present

## 2018-02-02 DIAGNOSIS — E559 Vitamin D deficiency, unspecified: Secondary | ICD-10-CM | POA: Diagnosis not present

## 2018-02-02 DIAGNOSIS — E038 Other specified hypothyroidism: Secondary | ICD-10-CM | POA: Diagnosis not present

## 2018-02-02 DIAGNOSIS — D72819 Decreased white blood cell count, unspecified: Secondary | ICD-10-CM

## 2018-02-02 DIAGNOSIS — E7849 Other hyperlipidemia: Secondary | ICD-10-CM

## 2018-02-02 DIAGNOSIS — M8580 Other specified disorders of bone density and structure, unspecified site: Secondary | ICD-10-CM | POA: Diagnosis not present

## 2018-02-02 DIAGNOSIS — R1013 Epigastric pain: Secondary | ICD-10-CM | POA: Diagnosis not present

## 2018-02-02 DIAGNOSIS — Z0000 Encounter for general adult medical examination without abnormal findings: Secondary | ICD-10-CM | POA: Diagnosis not present

## 2018-02-02 DIAGNOSIS — D696 Thrombocytopenia, unspecified: Secondary | ICD-10-CM | POA: Diagnosis not present

## 2018-02-02 MED ORDER — LEVOTHYROXINE SODIUM 50 MCG PO TABS: ORAL_TABLET | ORAL | 3 refills | Status: DC

## 2018-02-02 NOTE — Assessment & Plan Note (Signed)
Hypothyroidism  Pt has no clinical changes No change in energy level/ hair or skin/ edema and no tremor Lab Results  Component Value Date   TSH 4.55 (H) 01/28/2018     May have missed a dose  Will re check TSH in 6 mo for stability / change dose only if necessary

## 2018-02-02 NOTE — Progress Notes (Signed)
Subjective:    Patient ID: Jennifer Sellers, female    DOB: [birth year], 72 y.o.   MRN: [id number]  HPI Here for health maintenance exam and to review chronic medical problems    Doing ok overall  Feels her age a bit    Wt Readings from Last 3 Encounters:  02/02/18 137 lb 8 oz (62.4 kg)  01/28/18 136 lb 8 oz (61.9 kg)  04/22/17 138 lb 8 oz (62.8 kg)  stable weight  21.86 kg/m   Had amw 3/14 No gaps or concerns  Tetanus vaccine 5/08-will check at the pharmacy   Flu shots -declines  Severe local rxn to PNA vaccine- declines further    Mammogram 7/18 nl  Self breast exam -no lumps  Hysterectomy in the past   Colonoscopy 3/14 one polyp 5 y recall (she has an appointment for that)  Nl ifob 10/16   dexa 7/18- osteopenia - stable to improved  No falls or fx  D level is 48.9- great  Going to [place] for exercise    Zoster status - had a light case in the past  May be interested in shingrix    Hypothyroidism  Pt has no clinical changes No change in energy level/ hair or skin/ edema and no tremor Lab Results  Component Value Date   TSH 4.55 (H) 01/28/2018   she may have missed a dose     H/o RA No treatment currently  Mostly in her hands -not a lot of pain lately    BP Readings from Last 3 Encounters:  02/02/18 122/70  01/28/18 98/78  04/22/17 92/70   Lab Results  Component Value Date   CREATININE 0.96 01/28/2018   BUN 13 01/28/2018   NA 141 01/28/2018   K 3.9 01/28/2018   CL 105 01/28/2018   CO2 32 01/28/2018   Lab Results  Component Value Date   ALT 12 01/28/2018   AST 16 01/28/2018   ALKPHOS 71 01/28/2018   BILITOT 1.7 (H) 01/28/2018   Lab Results  Component Value Date   WBC 2.6 (L) 01/28/2018   HGB 12.7 01/28/2018   HCT 37.0 01/28/2018   MCV 93.6 01/28/2018   PLT 169.0 01/28/2018   no big changes  Platelets are normal  Wbc is down    Hyperlipidemia Lab Results  Component Value Date   CHOL 203 (H) 01/28/2018   CHOL 217 (H)  03/11/2017   CHOL 233 (H) 03/10/2016   Lab Results  Component Value Date   HDL 67.70 01/28/2018   HDL 67.60 03/11/2017   HDL 61.70 03/10/2016   Lab Results  Component Value Date   LDLCALC 125 (H) 01/28/2018   LDLCALC 134 (H) 03/11/2017   LDLCALC 152 (H) 03/10/2016   Lab Results  Component Value Date   TRIG 55.0 01/28/2018   TRIG 77.0 03/11/2017   TRIG 97.0 03/10/2016   Lab Results  Component Value Date   CHOLHDL 3 01/28/2018   CHOLHDL 3 03/11/2017   CHOLHDL 4 03/10/2016   Lab Results  Component Value Date   LDLDIRECT 163.4 11/11/2013   LDLDIRECT 152.5 08/14/2011   LDLDIRECT 145.0 04/27/2007   Lipo science profile in the past was re assuring  Improved - cut out candy  LDL is down to 125   Patient Active Problem List   Diagnosis Date Noted  . Vitamin D deficiency 08/03/2015  . Routine general medical examination at a health care facility 05/15/2015  . Dyspepsia 08/23/2014  . Abnormal ankle  brachial index 08/23/2014  . Rheumatoid arthritis ([place]) 06/19/2014  . Thrombocytopenia ([place]) 06/19/2014  . Encounter for Medicare annual wellness exam 11/11/2013  . Hyperbilirubinemia 08/19/2013  . Hypothyroid 09/21/2012  . NEOPLASMS UNSPEC NATURE BONE SOFT TISSUE&SKIN 10/23/2010  . 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 OTHER SYMPTOMS REFERABLE JOINT SITE UNSPECIFIED 10/23/2010  . CARPAL TUNNEL SYNDROME, HX OF 04/05/2009  . Leukocytopenia 05/02/2008  . DRY MOUTH 04/28/2008  . INSOMNIA, TRANSIENT 04/27/2007  . Osteopenia 04/27/2007  . Hyperlipidemia 04/23/2007  . HIATAL HERNIA 04/23/2007  . POSTMENOPAUSAL STATUS 04/23/2007  . RHEUMATOID FACTOR, POSITIVE 04/23/2007   Past Medical History:  Diagnosis Date  . Arthritis    rheumatoid arthritis  . Leukopenia   . Thyroid disease    Past Surgical History:  Procedure Laterality Date  . ABDOMINAL HYSTERECTOMY    . CARPAL TUNNEL RELEASE     Social History   Tobacco Use  . Smoking status: Never Smoker  . Smokeless tobacco: Never  Used  Substance Use Topics  . Alcohol use: Yes    Alcohol/week: 0.0 oz    Comment: occasional  . Drug use: No   Family History  Problem Relation Age of Onset  . Cancer Mother        lymphoma  . Cancer Brother        lymphoma  . Cancer Daughter        NHL   Allergies  Allergen Reactions  . Cortisporin [Bacitra-Neomycin-Polymyxin-Hc]     Burning / swelling of eyes with eye drops  . Prevnar [Pneumococcal 13-Val Conj Vacc] Other (See Comments)    Severe local reaction   . Sulfonamide Derivatives     REACTION: reaction  not known   Current Outpatient Medications on File Prior to Visit  Medication Sig Dispense Refill  . levothyroxine (SYNTHROID, LEVOTHROID) 50 MCG tablet TAKE 1 TABLET DAILY AND TAKE 2 TABLETS ON SUNDAY 104 tablet 3  . Multiple Vitamins-Minerals (EYE VITAMINS PO) Take by mouth.    . NON FORMULARY 2 drops daily. Vista  2    . NON FORMULARY     . VITAMIN D, CHOLECALCIFEROL, PO Take 5,000 Units by mouth daily.     No current facility-administered medications on file prior to visit.      Review of Systems  Constitutional: Negative for activity change, appetite change, fatigue, fever and unexpected weight change.  HENT: Negative for congestion, ear pain, rhinorrhea, sinus pressure and sore throat.   Eyes: Negative for pain, redness and visual disturbance.  Respiratory: Negative for cough, shortness of breath and wheezing.   Cardiovascular: Negative for chest pain and palpitations.  Gastrointestinal: Negative for abdominal pain, blood in stool, constipation and diarrhea.  Endocrine: Negative for polydipsia and polyuria.  Genitourinary: Negative for dysuria, frequency and urgency.  Musculoskeletal: Negative for arthralgias, back pain and myalgias.  Skin: Negative for pallor and rash.  Allergic/Immunologic: Negative for environmental allergies.  Neurological: Negative for dizziness, syncope and headaches.       Occ tingling in her feet  Hematological: Negative for  adenopathy. Does not bruise/bleed easily.  Psychiatric/Behavioral: Negative for decreased concentration and dysphoric mood. The patient is not nervous/anxious.        A worrier       Objective:   Physical Exam  Constitutional: She appears well-developed and well-nourished. No distress.  Well appearing   HENT:  Head: Normocephalic and atraumatic.  Right Ear: External ear normal.  Left Ear: External ear normal.  Mouth/Throat: Oropharynx is clear and moist.  Eyes:  Conjunctivae and EOM are normal. Pupils are equal, round, and reactive to light. No scleral icterus.  Neck: Normal range of motion. Neck supple. No JVD present. Carotid bruit is not present. No thyromegaly present.  Cardiovascular: Normal rate, regular rhythm, normal heart sounds and intact distal pulses. Exam reveals no gallop.  Pulmonary/Chest: Effort normal and breath sounds normal. No respiratory distress. She has no wheezes. She exhibits no tenderness.  Abdominal: Soft. Bowel sounds are normal. She exhibits no distension, no abdominal bruit and no mass. There is no tenderness.  Genitourinary: No breast swelling, tenderness, discharge or bleeding.  Genitourinary Comments: Breast exam: No mass, nodules, thickening, tenderness, bulging, retraction, inflamation, nipple discharge or skin changes noted.  No axillary or clavicular LA.      Musculoskeletal: Normal range of motion. She exhibits no edema or tenderness.  No kyphosis   Lymphadenopathy:    She has no cervical adenopathy.  Neurological: She is alert. She has normal reflexes. No cranial nerve deficit. She exhibits normal muscle tone. Coordination normal.  Skin: Skin is warm and dry. No rash noted. No erythema. No pallor.  Solar lentigines diffusely Some SKs  Psychiatric: She has a normal mood and affect.  Mildly anxious but pleasant and talkative          Assessment & Plan:   Problem List Items Addressed This Visit      Endocrine   Hypothyroid     Hypothyroidism  Pt has no clinical changes No change in energy level/ hair or skin/ edema and no tremor Lab Results  Component Value Date   TSH 4.55 (H) 01/28/2018     May have missed a dose  Will re check TSH in 6 mo for stability / change dose only if necessary      Relevant Medications   levothyroxine (SYNTHROID, LEVOTHROID) 50 MCG tablet     Musculoskeletal and Integument   Osteopenia    Stable to imp last dexa 7.18 Disc need for calcium/ vitamin D/ wt bearing exercise and bone density test every 2 y to monitor Disc safety/ fracture risk in detail   D level is tx      Rheumatoid arthritis ([place])    Overall stable Mostly in hands Pt may have some early signs of neuropathy in feet No current rheum tx         Other   Dyspepsia   Hyperbilirubinemia    1.7 No significant change Suspect Gilbert's      Hyperlipidemia    Disc goals for lipids and reasons to control them Rev labs with pt Rev low sat fat diet in detail LDL down a bit  Re assuring lipo science profile in the past       Leukocytopenia    Wbc is 2.6  Down slightly  Looked at trend  No symptoms or illnesses  May be related to RA      Routine general medical examination at a health care facility - Primary    Reviewed health habits including diet and exercise and skin cancer prevention Reviewed appropriate screening tests for age  Also reviewed health mt list, fam hx and immunization status , as well as social and family history   Rev amw See HPI Labs reviewed  Will re check TSH 6 mo (? Missed dose)  Enc mental and physical activity  Recommend Td at pharmacy or health dep rec Shingrix  Declines flu shot  Does not get PNA vaccine due to local rxn in the past  Thrombocytopenia ([facility])    Nl platelet ct today      Vitamin D deficiency    Vitamin D level is therapeutic with current supplementation Disc importance of this to bone and overall health

## 2018-02-02 NOTE — Assessment & Plan Note (Addendum)
Wbc is 2.6  Down slightly  Looked at trend  No symptoms or illnesses  May be related to RA

## 2018-02-02 NOTE — Assessment & Plan Note (Signed)
Overall stable Mostly in hands Pt may have some early signs of neuropathy in feet No current rheum tx

## 2018-02-02 NOTE — Assessment & Plan Note (Signed)
Vitamin D level is therapeutic with current supplementation Disc importance of this to bone and overall health  

## 2018-02-02 NOTE — Assessment & Plan Note (Signed)
Stable to imp last dexa 7.18 Disc need for calcium/ vitamin D/ wt bearing exercise and bone density test every 2 y to monitor Disc safety/ fracture risk in detail   D level is tx

## 2018-02-02 NOTE — Assessment & Plan Note (Signed)
Nl platelet ct today

## 2018-02-02 NOTE — Assessment & Plan Note (Signed)
Reviewed health habits including diet and exercise and skin cancer prevention Reviewed appropriate screening tests for age  Also reviewed health mt list, fam hx and immunization status , as well as social and family history   Rev amw See HPI Labs reviewed  Will re check TSH 6 mo (? Missed dose)  Enc mental and physical activity  Recommend Td at pharmacy or health dep rec Shingrix  Declines flu shot  Does not get PNA vaccine due to local rxn in the past

## 2018-02-02 NOTE — Assessment & Plan Note (Signed)
Disc goals for lipids and reasons to control them Rev labs with pt Rev low sat fat diet in detail LDL down a bit  Re assuring lipo science profile in the past

## 2018-02-02 NOTE — Patient Instructions (Addendum)
If you are interested in the new shingles vaccine (Shingrix) - call your local pharmacy to check on coverage and availability  Get on a wait list if you want one   Let's re check thyroid in 6 months  Try not to miss doses - and keep taking first thing in the am    Keep taking good care of yourself

## 2018-02-02 NOTE — Assessment & Plan Note (Signed)
1.7 No significant change Suspect Gilbert's

## 2018-03-11 DIAGNOSIS — Z86010 Personal history of colonic polyps: Secondary | ICD-10-CM | POA: Insufficient documentation

## 2018-05-25 ENCOUNTER — Telehealth: Payer: Self-pay | Admitting: Family Medicine

## 2018-05-25 NOTE — Telephone Encounter (Signed)
Copied from [place] [id number]. Topic: Quick Communication - Office Called Patient >> May 25, 2018  5:09 PM Neva Seat wrote: Pt returned missed call.  Please call pt back if needed.

## 2018-05-26 NOTE — Telephone Encounter (Signed)
I didn't call pt 

## 2018-06-25 ENCOUNTER — Encounter: Payer: Self-pay | Admitting: Family Medicine

## 2018-06-30 ENCOUNTER — Other Ambulatory Visit: Payer: Self-pay

## 2018-06-30 ENCOUNTER — Ambulatory Visit: Payer: Medicare Other | Admitting: Anesthesiology

## 2018-06-30 ENCOUNTER — Encounter: Payer: Self-pay | Admitting: Anesthesiology

## 2018-06-30 ENCOUNTER — Encounter
Admission: RE | Disposition: A | Discharge status: Home / Self Care | Payer: Self-pay | Source: Ambulatory Visit | Attending: Unknown Physician Specialty

## 2018-06-30 ENCOUNTER — Ambulatory Visit
Admission: RE | Admit: 2018-06-30 | Discharge: 2018-06-30 | Disposition: A | Discharge status: Home / Self Care | Payer: Medicare Other | Source: Ambulatory Visit | Attending: Unknown Physician Specialty | Admitting: Unknown Physician Specialty

## 2018-06-30 DIAGNOSIS — M069 Rheumatoid arthritis, unspecified: Secondary | ICD-10-CM | POA: Diagnosis not present

## 2018-06-30 DIAGNOSIS — D122 Benign neoplasm of ascending colon: Secondary | ICD-10-CM | POA: Insufficient documentation

## 2018-06-30 DIAGNOSIS — K640 First degree hemorrhoids: Secondary | ICD-10-CM | POA: Insufficient documentation

## 2018-06-30 DIAGNOSIS — E039 Hypothyroidism, unspecified: Secondary | ICD-10-CM | POA: Diagnosis not present

## 2018-06-30 DIAGNOSIS — Z79899 Other long term (current) drug therapy: Secondary | ICD-10-CM | POA: Insufficient documentation

## 2018-06-30 DIAGNOSIS — Z79890 Hormone replacement therapy (postmenopausal): Secondary | ICD-10-CM | POA: Insufficient documentation

## 2018-06-30 DIAGNOSIS — Z86010 Personal history of colonic polyps: Secondary | ICD-10-CM | POA: Insufficient documentation

## 2018-06-30 DIAGNOSIS — K5730 Diverticulosis of large intestine without perforation or abscess without bleeding: Secondary | ICD-10-CM | POA: Diagnosis not present

## 2018-06-30 DIAGNOSIS — Z09 Encounter for follow-up examination after completed treatment for conditions other than malignant neoplasm: Secondary | ICD-10-CM | POA: Diagnosis present

## 2018-06-30 HISTORY — PX: COLONOSCOPY WITH PROPOFOL: SHX5780

## 2018-06-30 HISTORY — DX: Benign neoplasm of colon, unspecified: D12.6

## 2018-06-30 HISTORY — DX: Hypothyroidism, unspecified: E03.9

## 2018-06-30 SURGERY — COLONOSCOPY WITH PROPOFOL
Anesthesia: General

## 2018-06-30 MED ORDER — MIDAZOLAM HCL 2 MG/2ML IJ SOLN
INTRAMUSCULAR | Status: AC
  Filled 2018-06-30: qty 2

## 2018-06-30 MED ORDER — LIDOCAINE HCL (PF) 2 % IJ SOLN
INTRAMUSCULAR | Status: DC | PRN
  Administered 2018-06-30: 60 mg

## 2018-06-30 MED ORDER — PROPOFOL 500 MG/50ML IV EMUL
INTRAVENOUS | Status: DC | PRN
  Administered 2018-06-30: 25 ug/kg/min via INTRAVENOUS

## 2018-06-30 MED ORDER — LIDOCAINE HCL (PF) 2 % IJ SOLN
INTRAMUSCULAR | Status: AC
  Filled 2018-06-30: qty 10

## 2018-06-30 MED ORDER — SODIUM CHLORIDE 0.9 % IV SOLN: INTRAVENOUS | Status: DC

## 2018-06-30 MED ORDER — SODIUM CHLORIDE 0.9 % IV SOLN
INTRAVENOUS | Status: DC
  Administered 2018-06-30: 09:00:00 via INTRAVENOUS

## 2018-06-30 MED ORDER — FENTANYL CITRATE (PF) 100 MCG/2ML IJ SOLN
INTRAMUSCULAR | Status: DC | PRN
  Administered 2018-06-30: 25 ug via INTRAVENOUS
  Administered 2018-06-30: 50 ug via INTRAVENOUS
  Administered 2018-06-30: 25 ug via INTRAVENOUS

## 2018-06-30 MED ORDER — PROPOFOL 10 MG/ML IV BOLUS
INTRAVENOUS | Status: DC | PRN
  Administered 2018-06-30: 20 mg via INTRAVENOUS
  Administered 2018-06-30 (×2): 10 mg via INTRAVENOUS

## 2018-06-30 MED ORDER — MIDAZOLAM HCL 5 MG/5ML IJ SOLN
INTRAMUSCULAR | Status: DC | PRN
  Administered 2018-06-30 (×2): 1 mg via INTRAVENOUS

## 2018-06-30 MED ORDER — FENTANYL CITRATE (PF) 100 MCG/2ML IJ SOLN
INTRAMUSCULAR | Status: AC
  Filled 2018-06-30: qty 2

## 2018-06-30 NOTE — Transfer of Care (Signed)
Immediate Anesthesia Transfer of Care Note  Patient: Jennifer Sellers  Procedure(s) Performed: COLONOSCOPY WITH PROPOFOL (N/A )  Patient Location: PACU  Anesthesia Type:General  Level of Consciousness: sedated  Airway & Oxygen Therapy: Patient Spontanous Breathing and Patient connected to nasal cannula oxygen  Post-op Assessment: Report given to RN and Post -op Vital signs reviewed and stable  Post vital signs: Reviewed and stable  Last Vitals:  Vitals Value Taken Time  BP    Temp 36.2 C 06/30/2018  9:40 AM  Pulse 62 06/30/2018  9:40 AM  Resp 12 06/30/2018  9:40 AM  SpO2 100 % 06/30/2018  9:40 AM  Vitals shown include unvalidated device data.  Last Pain:  Vitals:   06/30/18 0940  TempSrc: Tympanic  PainSc:          Complications: No apparent anesthesia complications

## 2018-06-30 NOTE — Anesthesia Preprocedure Evaluation (Addendum)
Anesthesia Evaluation  Patient identified by MRN, date of birth, ID band Patient awake    Reviewed: Allergy & Precautions, H&P , NPO status , Patient's Chart, lab work & pertinent test results, reviewed documented beta blocker date and time   History of Anesthesia Complications Negative for: history of anesthetic complications  Airway Mallampati: II  TM Distance: >3 FB Neck ROM: full    Dental  (+) Dental Advidsory Given, Caps, Teeth Intact   Pulmonary neg pulmonary ROS,           Cardiovascular Exercise Tolerance: Good negative cardio ROS       Neuro/Psych negative neurological ROS  negative psych ROS   GI/Hepatic negative GI ROS, Neg liver ROS,   Endo/Other  neg diabetesHypothyroidism   Renal/GU negative Renal ROS  negative genitourinary   Musculoskeletal  (+) Arthritis ,   Abdominal   Peds  Hematology negative hematology ROS (+)   Anesthesia Other Findings Past Medical History: No date: Arthritis     Comment:  rheumatoid arthritis No date: Colon adenomas No date: Hypothyroidism No date: Leukopenia No date: Thyroid disease   Reproductive/Obstetrics negative OB ROS                            Anesthesia Physical Anesthesia Plan  ASA: II  Anesthesia Plan: General   Post-op Pain Management:    Induction: Intravenous  PONV Risk Score and Plan: 3 and Propofol infusion and TIVA  Airway Management Planned: Nasal Cannula and Natural Airway  Additional Equipment:   Intra-op Plan:   Post-operative Plan:   Informed Consent: I have reviewed the patients History and Physical, chart, labs and discussed the procedure including the risks, benefits and alternatives for the proposed anesthesia with the patient or authorized representative who has indicated his/her understanding and acceptance.   Dental Advisory Given  Plan Discussed with: Anesthesiologist, CRNA and  Surgeon  Anesthesia Plan Comments:         Anesthesia Quick Evaluation

## 2018-06-30 NOTE — Op Note (Signed)
[facility] Gastroenterology Patient Name: Jennifer Sellers Procedure Date: 06/30/2018 8:46 AM MRN: [id number] Account #: [id number] Date of Birth: 31-Jul-1946 Admit Type: Outpatient Age: 72 Room: [facility] ENDO ROOM 3 Gender: Female Note Status: Finalized Procedure:            Colonoscopy Indications:          High risk colon cancer surveillance: Personal history                        of colonic polyps Providers:            Manya Silvas, MD Referring MD:         Wynelle Fanny. Tower (Referring MD) Medicines:            Propofol per Anesthesia Complications:        No immediate complications. Procedure:            Pre-Anesthesia Assessment:                       - After reviewing the risks and benefits, the patient                        was deemed in satisfactory condition to undergo the                        procedure.                       After obtaining informed consent, the colonoscope was                        passed under direct vision. Throughout the procedure,                        the patient's blood pressure, pulse, and oxygen                        saturations were monitored continuously. The                        Colonoscope was introduced through the anus and                        advanced to the the cecum, identified by appendiceal                        orifice and ileocecal valve. The colonoscopy was                        performed without difficulty. The patient tolerated the                        procedure well. The quality of the bowel preparation                        was excellent. Findings:      A diminutive polyp was found in the distal ascending colon. The polyp       was sessile. The polyp was removed with a jumbo cold forceps. Resection       and retrieval were complete.      A few medium-mouthed diverticula  were found in the sigmoid colon.      Internal hemorrhoids were found during endoscopy. The hemorrhoids were       small and Grade  I (internal hemorrhoids that do not prolapse).      The exam was otherwise without abnormality. Impression:           - One diminutive polyp in the distal ascending colon,                        removed with a jumbo cold forceps. Resected and                        retrieved.                       - Diverticulosis in the sigmoid colon.                       - Internal hemorrhoids.                       - The examination was otherwise normal. Recommendation:       - Await pathology results. Manya Silvas, MD 06/30/2018 9:39:44 AM This report has been signed electronically. Number of Addenda: 0 Note Initiated On: 06/30/2018 8:46 AM Scope Withdrawal Time: 0 hours 12 minutes 57 seconds  Total Procedure Duration: 0 hours 19 minutes 20 seconds       [facility]

## 2018-06-30 NOTE — H&P (Signed)
Primary Care Physician:  Tower, Wynelle Fanny, MD Primary Gastroenterologist:  Dr. Vira Agar  Pre-Procedure History & Physical: HPI:  Jennifer Sellers is a 72 y.o. female is here for an colonoscopy.  For personal history of colon polyps.   Past Medical History:  Diagnosis Date  . Arthritis    rheumatoid arthritis  . Colon adenomas   . Hypothyroidism   . Leukopenia   . Thyroid disease     Past Surgical History:  Procedure Laterality Date  . ABDOMINAL HYSTERECTOMY    . CARPAL TUNNEL RELEASE    . COLONOSCOPY    . colpo-ureteroscopy    . lens surgery      Prior to Admission medications   Medication Sig Start Date End Date Taking? Authorizing Provider  calcium carbonate (TUMS - DOSED IN MG ELEMENTAL CALCIUM) 500 MG chewable tablet Chew 1 tablet by mouth daily.   Yes [provider]  Cholecalciferol 1000 units tablet Take 1,000 Units by mouth daily.   Yes [provider]  levothyroxine (SYNTHROID, LEVOTHROID) 50 MCG tablet TAKE 1 TABLET DAILY AND TAKE 2 TABLETS ON SUNDAY 02/02/18  Yes Tower, Wynelle Fanny, MD  Multiple Vitamins-Minerals (EYE VITAMINS PO) Take by mouth.   Yes [provider]  NON FORMULARY 2 drops daily. Vista  2   Yes [provider]  NON FORMULARY    Yes [provider]  thiamine 250 MG tablet Take 250 mg by mouth daily.   Yes [provider]  VITAMIN D, CHOLECALCIFEROL, PO Take 5,000 Units by mouth daily.    [provider]    Allergies as of 03/16/2018 - Review Complete 02/02/2018  Allergen Reaction Noted  . Cortisporin [bacitra-neomycin-polymyxin-hc]  07/05/2012  . Prevnar [pneumococcal 13-val conj vacc] Other (See Comments) 08/03/2015  . Sulfonamide derivatives  04/23/2007    Family History  Problem Relation Age of Onset  . Cancer Mother        lymphoma  . Cancer Brother        lymphoma  . Cancer Daughter        NHL    Social History   Socioeconomic History  . Marital status: Married    Spouse  name: Not on file  . Number of children: Not on file  . Years of education: Not on file  . Highest education level: Not on file  Occupational History  . Not on file  Social Needs  . Financial resource strain: Not on file  . Food insecurity:    Worry: Not on file    Inability: Not on file  . Transportation needs:    Medical: Not on file    Non-medical: Not on file  Tobacco Use  . Smoking status: Never Smoker  . Smokeless tobacco: Never Used  Substance and Sexual Activity  . Alcohol use: Yes    Alcohol/week: 0.0 standard drinks    Comment: occasional  . Drug use: No  . Sexual activity: Yes  Lifestyle  . Physical activity:    Days per week: Not on file    Minutes per session: Not on file  . Stress: Not on file  Relationships  . Social connections:    Talks on phone: Not on file    Gets together: Not on file    Attends religious service: Not on file    Active member of club or organization: Not on file    Attends meetings of clubs or organizations: Not on file    Relationship status: Not on  file  . Intimate partner violence:    Fear of current or ex partner: Not on file    Emotionally abused: Not on file    Physically abused: Not on file    Forced sexual activity: Not on file  Other Topics Concern  . Not on file  Social History Narrative  . Not on file    Review of Systems: See HPI, otherwise negative ROS  Physical Exam: There were no vitals taken for this visit. General:   Alert,  pleasant and cooperative in NAD Head:  Normocephalic and atraumatic. Neck:  Supple; no masses or thyromegaly. Lungs:  Clear throughout to auscultation.    Heart:  Regular rate and rhythm. Abdomen:  Soft, nontender and nondistended. Normal bowel sounds, without guarding, and without rebound.   Neurologic:  Alert and  oriented x4;  grossly normal neurologically.  Impression/Plan: Jennifer Sellers is here for an colonoscopy to be performed for Personal history of colon polyps.  Risks,  benefits, limitations, and alternatives regarding  colonoscopy have been reviewed with the patient.  Questions have been answered.  All parties agreeable.   Gaylyn Cheers, MD  06/30/2018, 8:58 AM

## 2018-06-30 NOTE — Anesthesia Postprocedure Evaluation (Signed)
Anesthesia Post Note  Patient: Mathilde Mcwherter Heitman  Procedure(s) Performed: COLONOSCOPY WITH PROPOFOL (N/A )  Patient location during evaluation: Endoscopy Anesthesia Type: General Level of consciousness: awake and alert Pain management: pain level controlled Vital Signs Assessment: post-procedure vital signs reviewed and stable Respiratory status: spontaneous breathing, nonlabored ventilation, respiratory function stable and patient connected to nasal cannula oxygen Cardiovascular status: blood pressure returned to baseline and stable Postop Assessment: no apparent nausea or vomiting Anesthetic complications: no     Last Vitals:  Vitals:   06/30/18 0852 06/30/18 0940  BP: 114/90   Pulse: 72   Resp: 16   Temp: (!) 35.9 C (!) 36.2 C  SpO2: 100%     Last Pain:  Vitals:   06/30/18 0940  TempSrc: Tympanic  PainSc:                  Martha Clan

## 2018-06-30 NOTE — Anesthesia Post-op Follow-up Note (Signed)
Anesthesia QCDR form completed.        

## 2018-07-01 LAB — SURGICAL PATHOLOGY

## 2018-07-05 ENCOUNTER — Encounter: Payer: Self-pay | Admitting: Unknown Physician Specialty

## 2018-08-05 ENCOUNTER — Other Ambulatory Visit (INDEPENDENT_AMBULATORY_CARE_PROVIDER_SITE_OTHER): Payer: Medicare Other

## 2018-08-05 DIAGNOSIS — E038 Other specified hypothyroidism: Secondary | ICD-10-CM

## 2018-08-05 LAB — TSH: TSH: 3.34 u[IU]/mL (ref 0.35–4.50)

## 2018-08-06 ENCOUNTER — Encounter: Payer: Self-pay | Admitting: *Deleted

## 2018-08-09 ENCOUNTER — Telehealth: Payer: Self-pay | Admitting: Family Medicine

## 2018-08-09 NOTE — Telephone Encounter (Signed)
I can only do a screening test (that has already been done) -nothing I can do to monitor the progression from a lab perspective   (we go more by symptoms)    We can discuss that at her follow up

## 2018-08-09 NOTE — Telephone Encounter (Signed)
Copied from [place] [id number]. Topic: Quick Communication - See Telephone Encounter >> Aug 09, 2018 11:07 AM Blase Mess A wrote: CRM for notification. See Telephone encounter for: 08/09/18.Patient Arthritis can be monitored at her next appt with Dr. Alba Cory at the nex appt. Please advise

## 2018-08-09 NOTE — Telephone Encounter (Signed)
I spoke with pt and she was wanting recent labs for thyroid lab ck. Advised letter had been mailed but pt notified as instructed by Dr Glori Bickers and pt voiced understanding. Pt also wanted to know when comes for labs on 02/08/19 can RA labs be checked to see if there is any progression in RA. Pt has not been back to see rheumatologist. Pt will talk with Katha Cabal about the labs on 02/08/19. Pt has appt to see Dr Glori Bickers for annual on 02/11/19. fyi to Dr Glori Bickers.

## 2018-08-10 NOTE — Telephone Encounter (Signed)
Per DPR left VM letting pt know Dr. Tower's comments  

## 2019-02-06 ENCOUNTER — Telehealth: Payer: Self-pay | Admitting: Family Medicine

## 2019-02-06 DIAGNOSIS — E039 Hypothyroidism, unspecified: Secondary | ICD-10-CM

## 2019-02-06 DIAGNOSIS — D696 Thrombocytopenia, unspecified: Secondary | ICD-10-CM

## 2019-02-06 DIAGNOSIS — Z0000 Encounter for general adult medical examination without abnormal findings: Secondary | ICD-10-CM

## 2019-02-06 DIAGNOSIS — E559 Vitamin D deficiency, unspecified: Secondary | ICD-10-CM

## 2019-02-06 DIAGNOSIS — E7849 Other hyperlipidemia: Secondary | ICD-10-CM

## 2019-02-06 NOTE — Telephone Encounter (Signed)
-----   Message from Ellamae Sia sent at 02/03/2019 10:55 AM EDT ----- Regarding: Lab orders for Tuesday, 3.24.20 Patient is scheduled for CPX labs, please order future labs, Thanks , Karna Christmas

## 2019-02-08 ENCOUNTER — Other Ambulatory Visit: Payer: Medicare Other

## 2019-02-08 ENCOUNTER — Ambulatory Visit: Payer: Medicare Other

## 2019-02-09 ENCOUNTER — Other Ambulatory Visit: Payer: Self-pay | Admitting: Family Medicine

## 2019-02-11 ENCOUNTER — Encounter: Payer: Medicare Other | Admitting: Family Medicine

## 2019-03-29 ENCOUNTER — Telehealth: Payer: Self-pay

## 2019-03-29 NOTE — Telephone Encounter (Signed)
Drink more water and eat less processed foods with lots of sodium  Sit less/walk more when you can and when you do sit elevate your legs Support socks/stockings help also during the day-wear them as high as you need them  F/u before July if worse or if any other symptoms like sob or cp

## 2019-03-29 NOTE — Telephone Encounter (Signed)
Pt left v/m that ankles were swelling for 1 - 2 months; overnight swelling goes away for the most part. No difficulty breathing and no CP. Pt has been trying to prop up legs. Pt has been eating some salt in mixed nuts. Pt does not have way to ck BP. No other swelling noted. No redness or red streaks on lower legs, ankles or feet. No fever,chills,cough,S/T, SOB, muscle pain,diarrhea,H/a and has not loss sense of smell or taste.no travel and no known exposure to + covid or flu. Pt does not want to schedule a virtual or in office visit; pt just wanted to know some tips on how to get rid of swelling. I advised pt to keep legs elevated when sitting or laying and to decrease her salt intake. Pt said she had annual 05/24/19 with Dr Glori Bickers and if needed she would cb before appt. ED precautions given and pt voiced understanding. I advised would send note to Dr Glori Bickers and she voiced understanding.

## 2019-03-29 NOTE — Telephone Encounter (Signed)
Pt notified of Dr. Marliss Coots comments and recommendations and verbalized understanding she will try the at home recommendations but she will f/u with [place] if she needs to

## 2019-05-08 ENCOUNTER — Telehealth: Payer: Self-pay | Admitting: Family Medicine

## 2019-05-08 DIAGNOSIS — E559 Vitamin D deficiency, unspecified: Secondary | ICD-10-CM

## 2019-05-08 DIAGNOSIS — M069 Rheumatoid arthritis, unspecified: Secondary | ICD-10-CM

## 2019-05-08 DIAGNOSIS — D72819 Decreased white blood cell count, unspecified: Secondary | ICD-10-CM

## 2019-05-08 DIAGNOSIS — E7849 Other hyperlipidemia: Secondary | ICD-10-CM

## 2019-05-08 DIAGNOSIS — E039 Hypothyroidism, unspecified: Secondary | ICD-10-CM

## 2019-05-08 DIAGNOSIS — D696 Thrombocytopenia, unspecified: Secondary | ICD-10-CM

## 2019-05-08 DIAGNOSIS — Z0000 Encounter for general adult medical examination without abnormal findings: Secondary | ICD-10-CM

## 2019-05-08 NOTE — Telephone Encounter (Signed)
-----   Message from Ellamae Sia sent at 05/04/2019  2:19 PM EDT ----- Regarding: Lab orders for Monday, 6.22.20 Lab orders are in from March, patient requesting RA be done

## 2019-05-09 ENCOUNTER — Ambulatory Visit (INDEPENDENT_AMBULATORY_CARE_PROVIDER_SITE_OTHER): Payer: Medicare Other

## 2019-05-09 ENCOUNTER — Other Ambulatory Visit (INDEPENDENT_AMBULATORY_CARE_PROVIDER_SITE_OTHER): Payer: Medicare Other

## 2019-05-09 DIAGNOSIS — E039 Hypothyroidism, unspecified: Secondary | ICD-10-CM | POA: Diagnosis not present

## 2019-05-09 DIAGNOSIS — D696 Thrombocytopenia, unspecified: Secondary | ICD-10-CM

## 2019-05-09 DIAGNOSIS — E559 Vitamin D deficiency, unspecified: Secondary | ICD-10-CM

## 2019-05-09 DIAGNOSIS — D72819 Decreased white blood cell count, unspecified: Secondary | ICD-10-CM | POA: Diagnosis not present

## 2019-05-09 DIAGNOSIS — E7849 Other hyperlipidemia: Secondary | ICD-10-CM

## 2019-05-09 DIAGNOSIS — Z0000 Encounter for general adult medical examination without abnormal findings: Secondary | ICD-10-CM

## 2019-05-09 DIAGNOSIS — M069 Rheumatoid arthritis, unspecified: Secondary | ICD-10-CM

## 2019-05-09 LAB — LIPID PANEL
Cholesterol: 201 mg/dL — ABNORMAL HIGH (ref 0–200)
HDL: 60.2 mg/dL (ref >39.00)
LDL Cholesterol: 123 mg/dL — ABNORMAL HIGH (ref 0–99)
NonHDL: 141.16
Total CHOL/HDL Ratio: 3
Triglycerides: 90 mg/dL (ref 0.0–149.0)
VLDL: 18 mg/dL (ref 0.0–40.0)

## 2019-05-09 LAB — VITAMIN D 25 HYDROXY (VIT D DEFICIENCY, FRACTURES): VITD: 39.51 ng/mL (ref 30.00–100.00)

## 2019-05-09 LAB — COMPREHENSIVE METABOLIC PANEL
ALT: 11 U/L (ref 0–35)
AST: 19 U/L (ref 0–37)
Albumin: 4 g/dL (ref 3.5–5.2)
Alkaline Phosphatase: 76 U/L (ref 39–117)
BUN: 16 mg/dL (ref 6–23)
CO2: 30 mEq/L (ref 19–32)
Calcium: 8.8 mg/dL (ref 8.4–10.5)
Chloride: 106 mEq/L (ref 96–112)
Creatinine, Ser: 0.91 mg/dL (ref 0.40–1.20)
GFR: 60.23 mL/min (ref >60.00)
Glucose, Bld: 86 mg/dL (ref 70–99)
Potassium: 4.3 mEq/L (ref 3.5–5.1)
Sodium: 141 mEq/L (ref 135–145)
Total Bilirubin: 1.6 mg/dL — ABNORMAL HIGH (ref 0.2–1.2)
Total Protein: 6.3 g/dL (ref 6.0–8.3)

## 2019-05-09 LAB — CBC WITH DIFFERENTIAL/PLATELET
Basophils Absolute: 0 10*3/uL (ref 0.0–0.1)
Basophils Relative: 1.1 % (ref 0.0–3.0)
Eosinophils Absolute: 0.1 10*3/uL (ref 0.0–0.7)
Eosinophils Relative: 2.6 % (ref 0.0–5.0)
HCT: 37.6 % (ref 36.0–46.0)
Hemoglobin: 12.8 g/dL (ref 12.0–15.0)
Lymphocytes Relative: 34.2 % (ref 12.0–46.0)
Lymphs Abs: 1 10*3/uL (ref 0.7–4.0)
MCHC: 34.1 g/dL (ref 30.0–36.0)
MCV: 93.2 fl (ref 78.0–100.0)
Monocytes Absolute: 0.2 10*3/uL (ref 0.1–1.0)
Monocytes Relative: 7.2 % (ref 3.0–12.0)
Neutro Abs: 1.6 10*3/uL (ref 1.4–7.7)
Neutrophils Relative %: 54.9 % (ref 43.0–77.0)
Platelets: 166 10*3/uL (ref 150.0–400.0)
RBC: 4.03 Mil/uL (ref 3.87–5.11)
RDW: 13.1 % (ref 11.5–15.5)
WBC: 3 10*3/uL — ABNORMAL LOW (ref 4.0–10.5)

## 2019-05-09 LAB — SEDIMENTATION RATE: Sed Rate: 11 mm/hr (ref 0–30)

## 2019-05-09 LAB — TSH: TSH: 4.04 u[IU]/mL (ref 0.35–4.50)

## 2019-05-09 NOTE — Progress Notes (Signed)
PCP notes:   Health maintenance:  No gaps identified.  Abnormal screenings:   None  Patient concerns:   Bone density - pt wants PCP to discuss and order if necessary  Supplementation - pt wants to discuss necessity of supplements with PCP  Nurse concerns:  None  Next PCP appt:   05/24/19 @ 1015  I reviewed health advisor's note, was available for consultation, and agree with documentation and plan. Loura Pardon MD

## 2019-05-09 NOTE — Patient Instructions (Signed)
Ms. Lucarelli , Thank you for taking time to come for your Medicare Wellness Visit. I appreciate your ongoing commitment to your health goals. Please review the following plan we discussed and let me know if I can assist you in the future.   These are the goals we discussed: Goals    . Increase water intake     Starting 05/09/19, I will continue to drink 5-8 glasses of water daily.        This is a list of the screening recommended for you and due dates:  Health Maintenance  Topic Date Due  . Tetanus Vaccine  04/05/2027*  . Flu Shot  06/18/2019  . Mammogram  06/23/2019  . Colon Cancer Screening  06/30/2028  . DEXA scan (bone density measurement)  Completed  .  Hepatitis C: One time screening is recommended by Center for Disease Control  (CDC) for  adults born from [birth year] through 1967.   Completed  *Topic was postponed. The date shown is not the original due date.   Preventive Care for Adults  A healthy lifestyle and preventive care can promote health and wellness. Preventive health guidelines for adults include the following key practices.  . A routine yearly physical is a good way to check with your health care provider about your health and preventive screening. It is a chance to share any concerns and updates on your health and to receive a thorough exam.  . Visit your dentist for a routine exam and preventive care every 6 months. Brush your teeth twice a day and floss once a day. Good oral hygiene prevents tooth decay and gum disease.  . The frequency of eye exams is based on your age, health, family medical history, use  of contact lenses, and other factors. Follow your health care provider's recommendations for frequency of eye exams.  . Eat a healthy diet. Foods like vegetables, fruits, whole grains, low-fat dairy products, and lean protein foods contain the nutrients you need without too many calories. Decrease your intake of foods high in solid fats, added sugars, and salt. Eat the  right amount of calories for you. Get information about a proper diet from your health care provider, if necessary.  . Regular physical exercise is one of the most important things you can do for your health. Most adults should get at least 150 minutes of moderate-intensity exercise (any activity that increases your heart rate and causes you to sweat) each week. In addition, most adults need muscle-strengthening exercises on 2 or more days a week.  Silver Sneakers may be a benefit available to you. To determine eligibility, you may visit the website: www.silversneakers.com or contact program at 8106126813 Mon-Fri between 8AM-8PM.   . Maintain a healthy weight. The body mass index (BMI) is a screening tool to identify possible weight problems. It provides an estimate of body fat based on height and weight. Your health care provider can find your BMI and can help you achieve or maintain a healthy weight.   For adults 20 years and older: ? A BMI below 18.5 is considered underweight. ? A BMI of 18.5 to 24.9 is normal. ? A BMI of 25 to 29.9 is considered overweight. ? A BMI of 30 and above is considered obese.   . Maintain normal blood lipids and cholesterol levels by exercising and minimizing your intake of saturated fat. Eat a balanced diet with plenty of fruit and vegetables. Blood tests for lipids and cholesterol should begin at age 25 and  be repeated every 5 years. If your lipid or cholesterol levels are high, you are over 50, or you are at high risk for heart disease, you may need your cholesterol levels checked more frequently. Ongoing high lipid and cholesterol levels should be treated with medicines if diet and exercise are not working.  . If you smoke, find out from your health care provider how to quit. If you do not use tobacco, please do not start.  . If you choose to drink alcohol, please do not consume more than 2 drinks per day. One drink is considered to be 12 ounces (355 mL) of  beer, 5 ounces (148 mL) of wine, or 1.5 ounces (44 mL) of liquor.  . If you are 75-38 years old, ask your health care provider if you should take aspirin to prevent strokes.  . Use sunscreen. Apply sunscreen liberally and repeatedly throughout the day. You should seek shade when your shadow is shorter than you. Protect yourself by wearing Novacek sleeves, pants, a wide-brimmed hat, and sunglasses year round, whenever you are outdoors.  . Once a month, do a whole body skin exam, using a mirror to look at the skin on your back. Tell your health care provider of new moles, moles that have irregular borders, moles that are larger than a pencil eraser, or moles that have changed in shape or color.

## 2019-05-09 NOTE — Progress Notes (Signed)
Subjective:   Jennifer Sellers is a 73 y.o. female who presents for Medicare Annual (Subsequent) preventive examination.  Review of Systems:  N/A Cardiac Risk Factors include: advanced age (>11men, >41 women);dyslipidemia     Objective:     Vitals: There were no vitals taken for this visit.  There is no height or weight on file to calculate BMI.  Advanced Directives 05/09/2019 06/30/2018 01/28/2018 04/22/2017 05/15/2016  Does Patient Have a Medical Advance Directive? Yes Yes Yes Yes Yes  Type of Paramedic of [place];Living will [place];Living will [place];Living will [place];Living will [place];Living will  Does patient want to make changes to medical advance directive? No - Patient declined - - - No - Patient declined  Copy of [place] in Chart? No - copy requested - No - copy requested No - copy requested No - copy requested    Tobacco Social History   Tobacco Use  Smoking Status Never Smoker  Smokeless Tobacco Never Used     Counseling given: No   Clinical Intake:  Pre-visit preparation completed: Yes  Pain : No/denies pain     Nutritional Status: BMI of 19-24  Normal Nutritional Risks: None Diabetes: No  How often do you need to have someone help you when you read instructions, pamphlets, or other written materials from your doctor or pharmacy?: 1 - Never What is the last grade level you completed in school?: Bachelor degree  Interpreter Needed?: No  Comments: pt lives with spouse Information entered by :: LPinson, RN  Past Medical History:  Diagnosis Date  . Arthritis    rheumatoid arthritis  . Colon adenomas   . Hypothyroidism   . Leukopenia   . Thyroid disease    Past Surgical History:  Procedure Laterality Date  . ABDOMINAL HYSTERECTOMY    . CARPAL TUNNEL RELEASE    . COLONOSCOPY    . COLONOSCOPY WITH PROPOFOL N/A  06/30/2018   Procedure: COLONOSCOPY WITH PROPOFOL;  Surgeon: Manya Silvas, MD;  Location: [facility] ENDOSCOPY;  Service: Endoscopy;  Laterality: N/A;  . colpo-ureteroscopy    . lens surgery     Family History  Problem Relation Age of Onset  . Cancer Mother        lymphoma  . Cancer Brother        lymphoma  . Cancer Daughter        NHL   Social History   Socioeconomic History  . Marital status: Married    Spouse name: Not on file  . Number of children: Not on file  . Years of education: Not on file  . Highest education level: Not on file  Occupational History  . Not on file  Social Needs  . Financial resource strain: Not on file  . Food insecurity    Worry: Not on file    Inability: Not on file  . Transportation needs    Medical: Not on file    Non-medical: Not on file  Tobacco Use  . Smoking status: Never Smoker  . Smokeless tobacco: Never Used  Substance and Sexual Activity  . Alcohol use: Yes    Alcohol/week: 0.0 standard drinks    Comment: occasional  . Drug use: No  . Sexual activity: Yes  Lifestyle  . Physical activity    Days per week: Not on file    Minutes per session: Not on file  . Stress: Not on  file  Relationships  . Social Herbalist on phone: Not on file    Gets together: Not on file    Attends religious service: Not on file    Active member of club or organization: Not on file    Attends meetings of clubs or organizations: Not on file    Relationship status: Not on file  Other Topics Concern  . Not on file  Social History Narrative  . Not on file    Outpatient Encounter Medications as of 05/09/2019  Medication Sig  . Cholecalciferol (VITAMIN D3 PO) Take 2,000 Units by mouth daily.  Marland Kitchen levothyroxine (SYNTHROID, LEVOTHROID) 50 MCG tablet TAKE 1 TABLET DAILY AND TAKE 2 TABLETS ON SUNDAY  . Multiple Vitamins-Minerals (PRESERVISION AREDS 2 PO) Take 1 capsule by mouth daily.  . NON FORMULARY 2 drops daily. [place]  2  . [DISCONTINUED]  calcium carbonate (TUMS - DOSED IN MG ELEMENTAL CALCIUM) 500 MG chewable tablet Chew 1 tablet by mouth daily.  . [DISCONTINUED] Cholecalciferol 1000 units tablet Take 1,000 Units by mouth daily.  . [DISCONTINUED] Multiple Vitamins-Minerals (EYE VITAMINS PO) Take by mouth.  . [DISCONTINUED] NON FORMULARY   . [DISCONTINUED] thiamine 250 MG tablet Take 250 mg by mouth daily.  . [DISCONTINUED] VITAMIN D, CHOLECALCIFEROL, PO Take 5,000 Units by mouth daily.   No facility-administered encounter medications on file as of 05/09/2019.     Activities of Daily Living In your present state of health, do you have any difficulty performing the following activities: 05/09/2019  Hearing? N  Vision? N  Difficulty concentrating or making decisions? Y  Walking or climbing stairs? N  Dressing or bathing? N  Doing errands, shopping? N  Preparing Food and eating ? N  Using the Toilet? N  In the past six months, have you accidently leaked urine? N  Do you have problems with loss of bowel control? N  Managing your Medications? N  Managing your Finances? N  Housekeeping or managing your Housekeeping? N  Some recent data might be hidden    Patient Care Team: Tower, Wynelle Fanny, MD as PCP - General Lad, Caesar Bookman, MD as Referring Physician (Ophthalmology) Birder Robson, MD as Referring Physician (Ophthalmology) Worthy Keeler, DC as Consulting Physician (Chiropractic Medicine)    Assessment:   This is a routine wellness examination for [place].   Hearing Screening   125Hz  250Hz  500Hz  1000Hz  2000Hz  3000Hz  4000Hz  6000Hz  8000Hz   Right ear:           Left ear:           Vision Screening Comments: Vision exam in Jan 2020 with Dr. Purcell Nails [facility]   Exercise Activities and Dietary recommendations Current Exercise Habits: The patient does not participate in regular exercise at present, Exercise limited by: None identified  Goals    . Increase water intake     Starting 05/09/19, I will continue to  drink 5-8 glasses of water daily.        Fall Risk Fall Risk  05/09/2019 01/28/2018 04/22/2017 05/15/2016 05/15/2015  Falls in the past year? 0 No No No No   Depression Screen PHQ 2/9 Scores 05/09/2019 01/28/2018 04/22/2017 05/15/2016  PHQ - 2 Score 0 0 0 0  PHQ- 9 Score 0 0 - -     Cognitive Function MMSE - Mini Mental State Exam 05/09/2019 01/28/2018 04/22/2017 05/15/2016  Orientation to time 5 5 5 5   Orientation to Place 5 5 5 5   Registration 3 3 3  3  Attention/ Calculation 0 0 0 0  Recall 3 3 3 3   Language- name 2 objects 0 0 0 0  Language- repeat 1 1 1 1   Language- follow 3 step command 0 3 3 3   Language- read & follow direction 0 0 0 0  Write a sentence 0 0 0 0  Copy design 0 0 0 0  Total score 17 20 20 20      PLEASE NOTE: A Mini-Cog screen was completed. Maximum score is 17. A value of 0 denotes this part of [place] MMSE was not completed or the patient failed this part of the Mini-Cog screening.   Mini-Cog Screening Orientation to Time - Max 5 pts Orientation to Place - Max 5 pts Registration - Max 3 pts Recall - Max 3 pts Language Repeat - Max 1 pts      Immunization History  Administered Date(s) Administered  . Pneumococcal Conjugate-13 05/15/2015  . Td 04/06/2007    Screening Tests Health Maintenance  Topic Date Due  . TETANUS/TDAP  04/05/2027 (Originally 04/05/2017)  . INFLUENZA VACCINE  06/18/2019  . MAMMOGRAM  06/23/2019  . COLONOSCOPY  06/30/2028  . DEXA SCAN  Completed  . Hepatitis C Screening  Completed       Plan:     I have personally reviewed, addressed, and noted the following in the patient's chart:  A. Medical and social history B. Use of alcohol, tobacco or illicit drugs  C. Current medications and supplements D. Functional ability and status E.  Nutritional status F.  Physical activity G. Advance directives H. List of other physicians I.  Hospitalizations, surgeries, and ER visits in previous 12 months J.  Vitals (unless it is a  telemedicine encounter) K. Screenings to include cognitive, depression, hearing, vision (NOTE: hearing and vision screenings not completed in telemedicine encounter) L. Referrals and appointments   In addition, I have reviewed and discussed with patient certain preventive protocols, quality metrics, and best practice recommendations. A written personalized care plan for preventive services and recommendations were provided to patient.  With patient's permission, we connected on 05/09/19 at  9:30 AM EDT. Interactive audio and video telecommunications were attempted with patient. This attempt was unsuccessful due to patient having technical difficulties OR patient did not have access to video capability.  Encounter was completed with audio only.  Two patient identifiers were used to ensure the encounter occurred with the correct person. Patient was in home and writer was in office.     Signed,   Lindell Noe, MHA, BS, RN Health Coach

## 2019-05-10 ENCOUNTER — Other Ambulatory Visit: Payer: Self-pay | Admitting: Family Medicine

## 2019-05-11 LAB — ANA: Anti Nuclear Antibody (ANA): NEGATIVE

## 2019-05-11 LAB — RHEUMATOID FACTOR: Rheumatoid fact SerPl-aCnc: 148 IU/mL — ABNORMAL HIGH (ref <14)

## 2019-05-12 ENCOUNTER — Encounter: Payer: Medicare Other | Admitting: Family Medicine

## 2019-05-24 ENCOUNTER — Encounter: Payer: Self-pay | Admitting: Family Medicine

## 2019-05-24 ENCOUNTER — Ambulatory Visit (INDEPENDENT_AMBULATORY_CARE_PROVIDER_SITE_OTHER): Payer: Medicare Other | Admitting: Family Medicine

## 2019-05-24 ENCOUNTER — Other Ambulatory Visit: Payer: Self-pay

## 2019-05-24 VITALS — BP 124/70 | HR 71 | Temp 97.2°F | Ht 66.5 in | Wt 132.5 lb

## 2019-05-24 DIAGNOSIS — Z0000 Encounter for general adult medical examination without abnormal findings: Secondary | ICD-10-CM | POA: Diagnosis not present

## 2019-05-24 DIAGNOSIS — E7849 Other hyperlipidemia: Secondary | ICD-10-CM

## 2019-05-24 DIAGNOSIS — M8580 Other specified disorders of bone density and structure, unspecified site: Secondary | ICD-10-CM

## 2019-05-24 DIAGNOSIS — E039 Hypothyroidism, unspecified: Secondary | ICD-10-CM

## 2019-05-24 DIAGNOSIS — D696 Thrombocytopenia, unspecified: Secondary | ICD-10-CM

## 2019-05-24 DIAGNOSIS — M069 Rheumatoid arthritis, unspecified: Secondary | ICD-10-CM

## 2019-05-24 DIAGNOSIS — E2839 Other primary ovarian failure: Secondary | ICD-10-CM

## 2019-05-24 DIAGNOSIS — E559 Vitamin D deficiency, unspecified: Secondary | ICD-10-CM

## 2019-05-24 DIAGNOSIS — D72819 Decreased white blood cell count, unspecified: Secondary | ICD-10-CM

## 2019-05-24 NOTE — Assessment & Plan Note (Signed)
Disc goals for lipids and reasons to control them Rev last labs with pt Rev low sat fat diet in detail Stable with diet control  Favorable lipo science profile in the past

## 2019-05-24 NOTE — Assessment & Plan Note (Signed)
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

## 2019-05-24 NOTE — Progress Notes (Signed)
Subjective:    Patient ID: Jennifer Sellers, female    DOB: 03-26-1946, 73 y.o.   MRN: [id number]  HPI Here for health maintenance exam and to review chronic medical problems   Feels fair  Does not like staying home all of the time   Feeling her age  Husband is having a lot of health problems - this is hard on her as well  Stays active    Weight  Wt Readings from Last 3 Encounters:  05/24/19 132 lb 8 oz (60.1 kg)  06/30/18 137 lb (62.1 kg)  02/02/18 137 lb 8 oz (62.4 kg)  weight is down 5 lb - eating has changed /not going out anymore  21.07 kg/m   BP Readings from Last 3 Encounters:  05/24/19 124/70  06/30/18 114/90  02/02/18 122/70   Pulse Readings from Last 3 Encounters:  05/24/19 71  06/30/18 72  02/02/18 72     amw was 6/22 No gaps- but wanted to discuss dexa and supplements    Strong fam hx of lymphoma   Mammogram 8/19 -she wants to schedule it for august  Self breast exam - no lumps   Colonoscopy 8/19   Zoster status - has not had shingles vaccine  May be interested   Declines flu shots  Had rxn to pna vaccine in the past- severe local rxn to prevnar  Did not get the 2nd   dexa 7/18 -wants to get one  H/o osteopenia  D level is 39.5 with current supplementation  48 last year  Takes 2000 iu daily    Hypothyroidism  Pt has no clinical changes No change in energy level/ hair or skin/ edema and no tremor Lab Results  Component Value Date   TSH 4.04 05/09/2019     RA No current treatment  RF is 148 ANA neg  Thinks she is doing ok   H/o baseline elevated bilirubin Lab Results  Component Value Date   ALT 11 05/09/2019   AST 19 05/09/2019   ALKPHOS 76 05/09/2019   BILITOT 1.6 (H) 05/09/2019   likely gilbert's  No changes   Hyperlipidemia Lab Results  Component Value Date   CHOL 201 (H) 05/09/2019   CHOL 203 (H) 01/28/2018   CHOL 217 (H) 03/11/2017   Lab Results  Component Value Date   HDL 60.20 05/09/2019   HDL 67.70  01/28/2018   HDL 67.60 03/11/2017   Lab Results  Component Value Date   LDLCALC 123 (H) 05/09/2019   LDLCALC 125 (H) 01/28/2018   LDLCALC 134 (H) 03/11/2017   Lab Results  Component Value Date   TRIG 90.0 05/09/2019   TRIG 55.0 01/28/2018   TRIG 77.0 03/11/2017   Lab Results  Component Value Date   CHOLHDL 3 05/09/2019   CHOLHDL 3 01/28/2018   CHOLHDL 3 03/11/2017   Lab Results  Component Value Date   LDLDIRECT 163.4 11/11/2013   LDLDIRECT 152.5 08/14/2011   LDLDIRECT 145.0 04/27/2007   reassuring lipo science profile in the past Stable  Eats a healthy diet/mindful  Eats vegetables  Not a lot of meat in general    H/o leukocytopenia Lab Results  Component Value Date   WBC 3.0 (L) 05/09/2019   HGB 12.8 05/09/2019   HCT 37.6 05/09/2019   MCV 93.2 05/09/2019   PLT 166.0 05/09/2019   wbc is up from 2.6 ? If related to RA No symptoms  Also platelet ct in nl range/low in the past   Patient  Active Problem List   Diagnosis Date Noted  . Estrogen deficiency 05/24/2019  . Vitamin D deficiency 08/03/2015  . Routine general medical examination at a health care facility 05/15/2015  . Dyspepsia 08/23/2014  . Abnormal ankle brachial index 08/23/2014  . Rheumatoid arthritis ([place]) 06/19/2014  . Thrombocytopenia ([place]) 06/19/2014  . Encounter for Medicare annual wellness exam 11/11/2013  . Hyperbilirubinemia 08/19/2013  . Hypothyroid 09/21/2012  . NEOPLASMS UNSPEC NATURE BONE SOFT TISSUE&SKIN 10/23/2010  . 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 OTHER SYMPTOMS REFERABLE JOINT SITE UNSPECIFIED 10/23/2010  . CARPAL TUNNEL SYNDROME, HX OF 04/05/2009  . Leukocytopenia 05/02/2008  . DRY MOUTH 04/28/2008  . INSOMNIA, TRANSIENT 04/27/2007  . Osteopenia 04/27/2007  . Hyperlipidemia 04/23/2007  . HIATAL HERNIA 04/23/2007  . POSTMENOPAUSAL STATUS 04/23/2007  . RHEUMATOID FACTOR, POSITIVE 04/23/2007   Past Medical History:  Diagnosis Date  . Arthritis    rheumatoid arthritis  . Colon  adenomas   . Hypothyroidism   . Leukopenia   . Thyroid disease    Past Surgical History:  Procedure Laterality Date  . ABDOMINAL HYSTERECTOMY    . CARPAL TUNNEL RELEASE    . COLONOSCOPY    . COLONOSCOPY WITH PROPOFOL N/A 06/30/2018   Procedure: COLONOSCOPY WITH PROPOFOL;  Surgeon: Manya Silvas, MD;  Location: [facility] ENDOSCOPY;  Service: Endoscopy;  Laterality: N/A;  . colpo-ureteroscopy    . lens surgery     Social History   Tobacco Use  . Smoking status: Never Smoker  . Smokeless tobacco: Never Used  Substance Use Topics  . Alcohol use: Yes    Alcohol/week: 0.0 standard drinks    Comment: occasional  . Drug use: No   Family History  Problem Relation Age of Onset  . Cancer Mother        lymphoma  . Cancer Brother        lymphoma  . Cancer Daughter        NHL   Allergies  Allergen Reactions  . Cortisporin [Bacitra-Neomycin-Polymyxin-Hc]     Burning / swelling of eyes with eye drops  . Prevnar [Pneumococcal 13-Val Conj Vacc] Other (See Comments)    Severe local reaction   . Sulfonamide Derivatives     REACTION: reaction  not known   Current Outpatient Medications on File Prior to Visit  Medication Sig Dispense Refill  . Cholecalciferol (OPTIMAL-D PO) Take 1 tablet by mouth daily.    . Digestive Aids Mixture (DIGESTION GB) CAPS Take 1 capsule by mouth daily.    Marland Kitchen levothyroxine (SYNTHROID) 50 MCG tablet TAKE 1 TABLET DAILY AND TAKE 2 TABLETS ON SUNDAY 104 tablet 1  . Multiple Vitamins-Minerals (PRESERVISION AREDS 2 PO) Take 2 capsules by mouth daily.     . NON FORMULARY 2 drops daily. Vista  2    . NON FORMULARY Take 1 tablet by mouth daily. Dietary supplement    . OVER THE COUNTER MEDICATION Take 1 tablet by mouth daily. opti-methyl-b    . OVER THE COUNTER MEDICATION Take 1 drop by mouth daily. vista-2     No current facility-administered medications on file prior to visit.      Review of Systems  Constitutional: Negative for activity change, appetite change,  fatigue, fever and unexpected weight change.  HENT: Negative for congestion, ear pain, rhinorrhea, sinus pressure and sore throat.   Eyes: Negative for pain, redness and visual disturbance.  Respiratory: Negative for cough, shortness of breath and wheezing.   Cardiovascular: Negative for chest pain and palpitations.  Gastrointestinal:  Negative for abdominal pain, blood in stool, constipation and diarrhea.  Endocrine: Negative for polydipsia and polyuria.       Cold natured  Genitourinary: Negative for dysuria, frequency and urgency.  Musculoskeletal: Positive for arthralgias and back pain. Negative for myalgias.  Skin: Negative for pallor and rash.  Allergic/Immunologic: Negative for environmental allergies.  Neurological: Negative for dizziness, syncope and headaches.  Hematological: Negative for adenopathy. Does not bruise/bleed easily.  Psychiatric/Behavioral: Negative for decreased concentration and dysphoric mood. The patient is not nervous/anxious.        Objective:   Physical Exam Constitutional:      General: She is not in acute distress.    Appearance: Normal appearance. She is well-developed and normal weight. She is not ill-appearing or diaphoretic.     Comments: 45 elderly female  HENT:     Head: Normocephalic and atraumatic.     Right Ear: Tympanic membrane, ear canal and external ear normal.     Left Ear: Tympanic membrane, ear canal and external ear normal.     Nose: Nose normal.     Mouth/Throat:     Mouth: Mucous membranes are moist.     Pharynx: Oropharynx is clear.  Eyes:     General: No scleral icterus.    Conjunctiva/sclera: Conjunctivae normal.     Pupils: Pupils are equal, round, and reactive to light.  Neck:     Musculoskeletal: Normal range of motion and neck supple.     Thyroid: No thyromegaly.     Vascular: No carotid bruit or JVD.  Cardiovascular:     Rate and Rhythm: Normal rate and regular rhythm.     Heart sounds: Normal heart sounds. No  gallop.   Pulmonary:     Effort: Pulmonary effort is normal. No respiratory distress.     Breath sounds: Normal breath sounds. No wheezing or rales.  Chest:     Chest wall: No tenderness.  Abdominal:     General: Bowel sounds are normal. There is no distension or abdominal bruit.     Palpations: Abdomen is soft. There is no mass.     Tenderness: There is no abdominal tenderness. There is no right CVA tenderness or left CVA tenderness.     Hernia: No hernia is present.  Genitourinary:    Comments: Breast exam: No mass, nodules, thickening, tenderness, bulging, retraction, inflamation, nipple discharge or skin changes noted.  No axillary or clavicular LA.     Musculoskeletal: Normal range of motion.        General: No tenderness.     Right lower leg: No edema.     Left lower leg: No edema.  Lymphadenopathy:     Cervical: No cervical adenopathy.  Skin:    General: Skin is warm and dry.     Coloration: Skin is not pale.     Findings: No erythema or rash.     Comments: Solar lentigines diffusely Some light colored SKs  Neurological:     Mental Status: She is alert. Mental status is at baseline.     Cranial Nerves: No cranial nerve deficit.     Motor: No abnormal muscle tone.     Coordination: Coordination normal.     Gait: Gait normal.     Deep Tendon Reflexes: Reflexes are normal and symmetric. Reflexes normal.  Psychiatric:        Mood and Affect: Mood normal.        Cognition and Memory: Cognition and memory normal.  Assessment & Plan:   Problem List Items Addressed This Visit      Endocrine   Hypothyroid    Hypothyroidism  Pt has no clinical changes No change in energy level/ hair or skin/ edema and no tremor Lab Results  Component Value Date   TSH 4.04 05/09/2019            Musculoskeletal and Integument   Osteopenia    dexa ordered  Enc ca and D supplementation  Also exercise  No falls or fx       Rheumatoid arthritis (HCC)    RF done at pt  request-it is pos as expected No joint deformity She declines ref to rheumatology at this time/will continue to watch symptoms        Other   Hyperlipidemia    Disc goals for lipids and reasons to control them Rev last labs with pt Rev low sat fat diet in detail Stable with diet control  Favorable lipo science profile in the past      Leukocytopenia    Stable wbc of 3 Nl diff May be related to RA      Hyperbilirubinemia    Stable at 1.6 Suspect Gilbert's  No symptoms  Nl exam      Thrombocytopenia (HCC)    Nl cbc this draw      Routine general medical examination at a health care facility - Primary    Reviewed health habits including diet and exercise and skin cancer prevention Reviewed appropriate screening tests for age  Also reviewed health mt list, fam hx and immunization status , as well as social and family history   See HPI Labs reviewed  amw reviewed  Enc her to re consider flu shot this fall  She declines pna 23 due to local reaction with prevnar  Also to check on price of zostrix and get on wait list if affordable  dexa ordered  Enc ca and D      Vitamin D deficiency    Level in 22s Enc compliance with vit D in setting of low bone mass      Estrogen deficiency   Relevant Orders   DG Bone Density

## 2019-05-24 NOTE — Assessment & Plan Note (Signed)
Still positive Pt does not feel symptomatic enough to see rheumatology

## 2019-05-24 NOTE — Assessment & Plan Note (Signed)
Reviewed health habits including diet and exercise and skin cancer prevention Reviewed appropriate screening tests for age  Also reviewed health mt list, fam hx and immunization status , as well as social and family history   See HPI Labs reviewed  amw reviewed  Enc her to re consider flu shot this fall  She declines pna 23 due to local reaction with prevnar  Also to check on price of zostrix and get on wait list if affordable  dexa ordered  Enc ca and D

## 2019-05-24 NOTE — Assessment & Plan Note (Signed)
Hypothyroidism  Pt has no clinical changes No change in energy level/ hair or skin/ edema and no tremor Lab Results  Component Value Date   TSH 4.04 05/09/2019

## 2019-05-24 NOTE — Patient Instructions (Addendum)
The office will call you about the dexa  Make sure to schedule your mammogram   If you are interested in the new shingles vaccine (Shingrix) - call your local pharmacy to check on coverage and availability  If affordable, get on a wait list at your pharmacy to get the vaccine.  I strongly encourage a flu vaccine this season   Let us know if you struggle with rheumatoid arthritis and symptoms so we can set you up with rheumatology  Protein is important in diet Meat/dairy/nuts/nut butters/dried beans/ soy/ fish   Get protein with every meals    Stay as active as you can be physically and mentally- but take your time   (it takes longer to get things done)

## 2019-05-24 NOTE — Assessment & Plan Note (Signed)
Level in 30s Enc compliance with vit D in setting of low bone mass

## 2019-05-24 NOTE — Assessment & Plan Note (Signed)
Stable wbc of 3 Nl diff May be related to RA

## 2019-05-24 NOTE — Assessment & Plan Note (Signed)
Nl cbc this draw

## 2019-05-24 NOTE — Assessment & Plan Note (Signed)
dexa ordered  Enc ca and D supplementation  Also exercise  No falls or fx

## 2019-05-24 NOTE — Assessment & Plan Note (Signed)
RF done at pt request-it is pos as expected No joint deformity She declines ref to rheumatology at this time/will continue to watch symptoms

## 2019-06-28 ENCOUNTER — Encounter: Payer: Self-pay | Admitting: Family Medicine

## 2019-07-08 ENCOUNTER — Telehealth: Payer: Self-pay | Admitting: Family Medicine

## 2019-07-08 NOTE — Telephone Encounter (Signed)
Patient advised and verbalized understanding 

## 2019-07-08 NOTE — Telephone Encounter (Signed)
Yes-I got it today (paper copy since [facility]-was waiting in [facility] in box)  Osteopenia with slight decrease in bone density from last check  Continue vitamin D and calcium if she can take it /exercise if able Will re check in 2 years

## 2019-07-08 NOTE — Telephone Encounter (Signed)
Dr. Glori Bickers have you received Bone Density result on the patient? I do not see result in the chart. Please review.

## 2019-07-08 NOTE — Telephone Encounter (Signed)
Patient called in regards to her Bone Density scan she had done  She stated she has not heard back about any results for this and wanted to check to see if we have any information that we can give her,   Patient requested a call back   C/B #  [phone]

## 2019-08-08 ENCOUNTER — Telehealth: Payer: Self-pay | Admitting: Radiology

## 2019-08-08 NOTE — Telephone Encounter (Signed)
The patient wants to know if in the future would she still have to have her RA done. She knows it's positive. Does it matter if the value is higher or lower for treatment

## 2019-08-08 NOTE — Telephone Encounter (Signed)
We would refer her to a rheumatologist for further eval and treatment (they will end up doing some more labs regardless)  Let me know if/when she would like a referral

## 2019-08-08 NOTE — Telephone Encounter (Signed)
Left message on voicemail for patient to call back. 

## 2019-08-09 NOTE — Telephone Encounter (Signed)
[place] Night - Client Nonclinical Telephone Record AccessNurse Client [place] Night - Client Client Site [place] Physician Tower, Roque Lias - MD Contact Type Call Who Is Calling Patient / Member / Family / Caregiver Caller Name Remya Hernandezgarci Phone Number [phone] Call Type Message Only Information Provided Reason for Call Returning a Call from the Office Initial [place] states that she just missed a call from the office and states that it was in regards to her recent bloodwork. Caller would like a return call back when the office reopens in the morning. Additional Comment Office hours provided. Call Closed By: Arminda Resides Transaction Date/Time: 08/08/2019 5:02:05 PM (ET)

## 2019-08-10 NOTE — Telephone Encounter (Signed)
Called pt and no answer, didn't leave a VM (kept ringing)

## 2019-08-12 NOTE — Telephone Encounter (Signed)
Pt notified of Dr. Marliss Coots comments. Pt wasn't aware how high her Rheumatoid factor was. She said she saw a rheumatologist years ago and she didn't want to start on any meds given the side effs. Pt advise she can always get a referral for a 2nd opinion to a new rheumatologist to see what they think. Pt said she will keep that in mind but she declines a referral now. If she changes her mind she will call back

## 2019-10-17 ENCOUNTER — Other Ambulatory Visit: Payer: Self-pay | Admitting: Family Medicine

## 2019-11-22 ENCOUNTER — Telehealth: Payer: Self-pay | Admitting: Family Medicine

## 2019-11-22 NOTE — Telephone Encounter (Signed)
Patient called.  Patient wanted to ask Dr.Tower if she recommends patient get a covid vaccine.  Patient said when she received the pneumonia shot she had a reaction at the site of the shot.  It was red and swollen. Dr.Tower told patient she wouldn't give her a second shot for the pneumonia due to the reaction.

## 2019-11-22 NOTE — Telephone Encounter (Signed)
The only contraindication for the vaccine is history of anaphylaxis (severe allergic reaction with trouble breathing) to any substance (med/food/bee sting etc)  I cannot r/o a local reaction from the vaccine - so I will let her think about it - local reactions are unpleasant but not life threatening

## 2019-11-23 NOTE — Telephone Encounter (Signed)
Pt notified of Dr. Tower's comments and verbalized understanding  

## 2019-12-07 ENCOUNTER — Ambulatory Visit: Payer: Medicare Other | Attending: Internal Medicine

## 2019-12-07 DIAGNOSIS — Z23 Encounter for immunization: Secondary | ICD-10-CM | POA: Insufficient documentation

## 2019-12-07 NOTE — Progress Notes (Signed)
   Covid-19 Vaccination Clinic  Name:  LAJLA HILBUN    MRN: [id number] DOB: 02-Dec-1945  12/07/2019  Ms. Trickey was observed post Covid-19 immunization for 15 minutes without incidence. She was provided with Vaccine Information Sheet and instruction to access the V-Safe system.   Ms. Tilmon was instructed to call 911 with any severe reactions post vaccine: Marland Kitchen Difficulty breathing  . Swelling of your face and throat  . A fast heartbeat  . A bad rash all over your body  . Dizziness and weakness    Immunizations Administered    Name Date Dose VIS Date Route   Pfizer COVID-19 Vaccine 12/07/2019  2:09 PM 0.3 mL 10/28/2019 Intramuscular   Manufacturer: [place]   Lot: [id number]   [place]: [id number]

## 2019-12-08 ENCOUNTER — Ambulatory Visit: Payer: Self-pay | Admitting: *Deleted

## 2019-12-08 NOTE — Telephone Encounter (Signed)
Pt called regarding having a reaction to the first dose of the covid vaccine. She stated that she received the vaccine yesterday afternoon and now her arm is a little red where she go the injection. She denies fever, maybe a little swelling. Advised to put a cool or cold compress to the area and try to use that arm. Also to take Tylenol or ibuprofen for discomfort. She stated she had aleve.  Advised to call back in the morning if she feels worst or increase in symptoms. She voiced understanding.  Reason for Disposition . COVID-19 vaccine, injection site reaction (e.g., pain, redness, swelling), question about  Answer Assessment - Initial Assessment Questions 1. MAIN CONCERN OR SYMPTOM:  "What is your main concern right now?" "What question do you have?" "What's the main symptom you're worried about?" (e.g., fever, pain, redness, swelling)     redness 2. VACCINE: "What vaccination did you receive?" "Is this your first or second shot?" (e.g., none; Moderna, Pfizer, other)     The first dose 3. SYMPTOM ONSET: "When did the redess begin?" (e.g., not relevant; hours, days)      today 4. SYMPTOM SEVERITY: "How bad is it?"      Some redness around the site 5. FEVER: "Is there a fever?" If so, ask: "What is it, how was it measured, and when did it start?"      no 6. PAST REACTIONS: "Have you reacted to immunizations before?" If so, ask: "What happened?"      7. OTHER SYMPTOMS: "Do you have any other symptoms?"     no  Protocols used: CORONAVIRUS (COVID-19) VACCINE QUESTIONS AND REACTIONS-A-AH

## 2019-12-09 NOTE — Telephone Encounter (Signed)
This is a common local reaction - those measures should help  Keep [place] posted

## 2019-12-28 ENCOUNTER — Ambulatory Visit: Payer: Medicare Other | Attending: Internal Medicine

## 2019-12-28 DIAGNOSIS — Z23 Encounter for immunization: Secondary | ICD-10-CM

## 2019-12-28 NOTE — Progress Notes (Signed)
   Covid-19 Vaccination Clinic  Name:  Jennifer Sellers    MRN: [id number] DOB: 30-Sep-1946  12/28/2019   Jennifer Sellers was observed post Covid-19 immunization for 15 minutes without incidence. She was provided with Vaccine Information Sheet and instruction to access the V-Safe system.   Jennifer Sellers was instructed to call 911 with any severe reactions post vaccine: Marland Kitchen Difficulty breathing  . Swelling of your face and throat  . A fast heartbeat  . A bad rash all over your body  . Dizziness and weakness    Immunizations Administered    Name Date Dose VIS Date Route   Pfizer COVID-19 Vaccine 12/28/2019  4:33 PM 0.3 mL 10/28/2019 Intramuscular   Manufacturer: [place]   Lot: [id number]   [place]: [id number]

## 2020-03-20 ENCOUNTER — Telehealth: Payer: Self-pay

## 2020-03-20 NOTE — Telephone Encounter (Signed)
Pt said she has gotten 2 mailings from [facility] offering pt testing for Afib or irregular heart beat, PAD or aortic aneurysm, osteoporosis, and carotid artery test for plaque. The cost for all 4 test is $100.00. pt wants to know if Dr Glori Bickers thinks this would be a good idea to have these test and does she know if this is a reputable co and the testing is worth the cost. Pt request cb. Pt has CPX scheduled with Dr Glori Bickers on 05/30/20.

## 2020-03-20 NOTE — Telephone Encounter (Signed)
With the exception of bone density screening (dexa) those are not approved routine screening tests.  I am fine with her getting them- but if she has a positive test we will likely need to repeat it to verify

## 2020-03-21 NOTE — Telephone Encounter (Signed)
Pt notified of Dr. Tower's comments and instructions and verbalized understanding  

## 2020-04-23 ENCOUNTER — Telehealth: Payer: Self-pay | Admitting: *Deleted

## 2020-04-23 NOTE — Telephone Encounter (Signed)
Pt called triage. She said her insurance changed and she was going to have to pay over $100 if she gets name brand synthroid but she wanted to see if her Rx was for name brand only. I did advise pt I can see her Rx isn't "DAW" and it can be filled for name brand or generic. Pt just wanted to confirm that before she told her new ins ([employer]) to fill the synthroid for the generic levothyroxine. I did advise pt if there is any issue going from name brand to generic to let us know. No other info needed. She also has a CPE/Labs appt next month so I advised pt we usually check her levels then so if the generic was causing any issues we would know. Pt verbalized understanding.  FYI to PCP, since nothing further needs to bed done

## 2020-05-22 ENCOUNTER — Telehealth: Payer: Self-pay | Admitting: Family Medicine

## 2020-05-22 DIAGNOSIS — E7849 Other hyperlipidemia: Secondary | ICD-10-CM

## 2020-05-22 DIAGNOSIS — Z0000 Encounter for general adult medical examination without abnormal findings: Secondary | ICD-10-CM

## 2020-05-22 DIAGNOSIS — E039 Hypothyroidism, unspecified: Secondary | ICD-10-CM

## 2020-05-22 DIAGNOSIS — D696 Thrombocytopenia, unspecified: Secondary | ICD-10-CM

## 2020-05-22 DIAGNOSIS — E559 Vitamin D deficiency, unspecified: Secondary | ICD-10-CM

## 2020-05-22 NOTE — Telephone Encounter (Signed)
Orders done

## 2020-05-22 NOTE — Telephone Encounter (Signed)
Patient was on [employer] schedule so I got her rescheduled for her to come in for her fasting labs tomorrow and to receive the MWPPW at lab appointment before next weeks physical. She just needs lab orders for tomorrow.

## 2020-05-23 ENCOUNTER — Other Ambulatory Visit (INDEPENDENT_AMBULATORY_CARE_PROVIDER_SITE_OTHER): Payer: Medicare Other

## 2020-05-23 ENCOUNTER — Telehealth: Payer: Self-pay

## 2020-05-23 ENCOUNTER — Telehealth: Payer: Self-pay | Admitting: *Deleted

## 2020-05-23 ENCOUNTER — Other Ambulatory Visit: Payer: Self-pay

## 2020-05-23 ENCOUNTER — Ambulatory Visit: Payer: Medicare Other

## 2020-05-23 DIAGNOSIS — E039 Hypothyroidism, unspecified: Secondary | ICD-10-CM

## 2020-05-23 DIAGNOSIS — E559 Vitamin D deficiency, unspecified: Secondary | ICD-10-CM

## 2020-05-23 DIAGNOSIS — Z0000 Encounter for general adult medical examination without abnormal findings: Secondary | ICD-10-CM | POA: Diagnosis not present

## 2020-05-23 DIAGNOSIS — D696 Thrombocytopenia, unspecified: Secondary | ICD-10-CM | POA: Diagnosis not present

## 2020-05-23 DIAGNOSIS — E7849 Other hyperlipidemia: Secondary | ICD-10-CM | POA: Diagnosis not present

## 2020-05-23 LAB — COMPREHENSIVE METABOLIC PANEL
ALT: 10 U/L (ref 0–35)
AST: 18 U/L (ref 0–37)
Albumin: 4 g/dL (ref 3.5–5.2)
Alkaline Phosphatase: 73 U/L (ref 39–117)
BUN: 15 mg/dL (ref 6–23)
CO2: 30 mEq/L (ref 19–32)
Calcium: 8.8 mg/dL (ref 8.4–10.5)
Chloride: 106 mEq/L (ref 96–112)
Creatinine, Ser: 0.88 mg/dL (ref 0.40–1.20)
GFR: 62.43 mL/min (ref >60.00)
Glucose, Bld: 97 mg/dL (ref 70–99)
Potassium: 4.7 mEq/L (ref 3.5–5.1)
Sodium: 141 mEq/L (ref 135–145)
Total Bilirubin: 1.6 mg/dL — ABNORMAL HIGH (ref 0.2–1.2)
Total Protein: 6.3 g/dL (ref 6.0–8.3)

## 2020-05-23 LAB — CBC WITH DIFFERENTIAL/PLATELET
Basophils Absolute: 0 10*3/uL (ref 0.0–0.1)
Basophils Relative: 1.2 % (ref 0.0–3.0)
Eosinophils Absolute: 0.1 10*3/uL (ref 0.0–0.7)
Eosinophils Relative: 4.8 % (ref 0.0–5.0)
HCT: 35.6 % — ABNORMAL LOW (ref 36.0–46.0)
Hemoglobin: 12.3 g/dL (ref 12.0–15.0)
Lymphocytes Relative: 29.8 % (ref 12.0–46.0)
Lymphs Abs: 0.7 10*3/uL (ref 0.7–4.0)
MCHC: 34.5 g/dL (ref 30.0–36.0)
MCV: 93.3 fl (ref 78.0–100.0)
Monocytes Absolute: 0.2 10*3/uL (ref 0.1–1.0)
Monocytes Relative: 6.6 % (ref 3.0–12.0)
Neutro Abs: 1.4 10*3/uL (ref 1.4–7.7)
Neutrophils Relative %: 57.6 % (ref 43.0–77.0)
Platelets: 160 10*3/uL (ref 150.0–400.0)
RBC: 3.81 Mil/uL — ABNORMAL LOW (ref 3.87–5.11)
RDW: 13 % (ref 11.5–15.5)
WBC: 2.3 10*3/uL — ABNORMAL LOW (ref 4.0–10.5)

## 2020-05-23 LAB — LIPID PANEL
Cholesterol: 205 mg/dL — ABNORMAL HIGH (ref 0–200)
HDL: 60.9 mg/dL (ref >39.00)
LDL Cholesterol: 131 mg/dL — ABNORMAL HIGH (ref 0–99)
NonHDL: 143.61
Total CHOL/HDL Ratio: 3
Triglycerides: 65 mg/dL (ref 0.0–149.0)
VLDL: 13 mg/dL (ref 0.0–40.0)

## 2020-05-23 LAB — TSH: TSH: 4.15 u[IU]/mL (ref 0.35–4.50)

## 2020-05-23 LAB — VITAMIN D 25 HYDROXY (VIT D DEFICIENCY, FRACTURES): VITD: 29.65 ng/mL — ABNORMAL LOW (ref 30.00–100.00)

## 2020-05-23 NOTE — Telephone Encounter (Signed)
Jennifer Salisbury LPN has already spoken with pt and advised pt can take synthroid prior to lab testing.

## 2020-05-23 NOTE — Telephone Encounter (Signed)
Pt came in for her labs for her AWV, pt wanted a magnesium level checked. I did draw enough blood if you wanted to add it. I asked pt why she would like it drawn, pt isn't having any sxs she just read an article that it's important to your health that your magnesium level is in normal range and would like it added

## 2020-05-23 NOTE — Telephone Encounter (Signed)
Jennifer Sellers - Client Nonclinical Telephone Record AccessNurse Client Jennifer Sellers - Client Client Site [place] Physician Loura Pardon - MD Contact Type Call Who Is Calling Patient / Member / Family / Caregiver Caller Name Syerra Abdelrahman Phone Number [phone] Patient Name Jennifer Sellers Patient DOB [birth year] Call Type Message Only Information Provided Reason for Call Medication Question / Request Initial Comment Caller states she is having bloodwork today and doesn't know if she should take her Synthroid . Additional Comment Disp. Time Disposition Final User 05/23/2020 8:15:41 AM General Information Provided Yes Luan Pulling, Dawn Call Closed By: Epifania Gore Transaction Date/Time: 05/23/2020 8:08:43 AM (ET)

## 2020-05-23 NOTE — Telephone Encounter (Signed)
I don't have a code for it-she may need to sign ABN-please let her know and I will order if she still wants to do it

## 2020-05-23 NOTE — Telephone Encounter (Signed)
Per [name] it's to late to add since pt is gone and can't sign ABN, will have to discuss getting that lab test at appt next week

## 2020-05-30 ENCOUNTER — Other Ambulatory Visit: Payer: Self-pay

## 2020-05-30 ENCOUNTER — Encounter: Payer: Self-pay | Admitting: Family Medicine

## 2020-05-30 ENCOUNTER — Ambulatory Visit (INDEPENDENT_AMBULATORY_CARE_PROVIDER_SITE_OTHER): Payer: Medicare Other | Admitting: Family Medicine

## 2020-05-30 VITALS — BP 124/72 | HR 78 | Temp 96.9°F | Ht 66.5 in | Wt 138.2 lb

## 2020-05-30 DIAGNOSIS — M069 Rheumatoid arthritis, unspecified: Secondary | ICD-10-CM | POA: Diagnosis not present

## 2020-05-30 DIAGNOSIS — M8589 Other specified disorders of bone density and structure, multiple sites: Secondary | ICD-10-CM

## 2020-05-30 DIAGNOSIS — E039 Hypothyroidism, unspecified: Secondary | ICD-10-CM

## 2020-05-30 DIAGNOSIS — E559 Vitamin D deficiency, unspecified: Secondary | ICD-10-CM

## 2020-05-30 DIAGNOSIS — D696 Thrombocytopenia, unspecified: Secondary | ICD-10-CM

## 2020-05-30 DIAGNOSIS — Z0000 Encounter for general adult medical examination without abnormal findings: Secondary | ICD-10-CM | POA: Diagnosis not present

## 2020-05-30 DIAGNOSIS — E7849 Other hyperlipidemia: Secondary | ICD-10-CM

## 2020-05-30 DIAGNOSIS — D72819 Decreased white blood cell count, unspecified: Secondary | ICD-10-CM

## 2020-05-30 NOTE — Assessment & Plan Note (Signed)
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

## 2020-05-30 NOTE — Assessment & Plan Note (Signed)
Reviewed health habits including diet and exercise and skin cancer prevention Reviewed appropriate screening tests for age  Also reviewed health mt list, fam hx and immunization status , as well as social and family history   See HPI Labs reviewed Reminded to schedule mammogram for august  No pna 23 in light of severe local rxn to the prevnar Did get covid vaccines Thinking about shingrix  Disc dexa and fall prevention as well as vitamin D intake and exercise  Rev adv directive-is up to date  Pt has worries about memory and cognition-suggest f/u for full mms exam (today not bad)  Reviewed care team  Good hearing screen  utd vision and eye exams

## 2020-05-30 NOTE — Assessment & Plan Note (Signed)
Not currently treating  Not interested in rheum ref yet

## 2020-05-30 NOTE — Assessment & Plan Note (Signed)
Suspect due to RA Not symptomatic  Continue to follow

## 2020-05-30 NOTE — Patient Instructions (Addendum)
If you are interested in the new shingles vaccine (Shingrix) - call your local pharmacy to check on coverage and availability  If affordable, get on a wait list at your pharmacy to get the vaccine.  Get back on your vitamin D every day - for bone health  Try to get back to regular exercise   If you want to do a memory test -please follow up for a visit   There is a great book by Coral Spikes called "Keep [place] Ard"  If you want to see an arthritis specialist please call and let us know   Cholesterol is up just a little  Avoid red meat/ fried foods/ egg yolks/ fatty breakfast meats/ butter, cheese and high fat dairy/ and shellfish

## 2020-05-30 NOTE — Assessment & Plan Note (Signed)
Level 29.6 Enc pt to be more compliant with daily dosing of D Stressed imp for bone and overall health  inst to keep a pill box

## 2020-05-30 NOTE — Progress Notes (Signed)
Subjective:    Patient ID: Jennifer Sellers, female    DOB: 24-Apr-1946, 74 y.o.   MRN: [id number]  This visit occurred during the SARS-CoV-2 public health emergency.  Safety protocols were in place, including screening questions prior to the visit, additional usage of staff PPE, and extensive cleaning of exam room while observing appropriate contact time as indicated for disinfecting solutions.    HPI I have personally reviewed the Medicare Annual Wellness questionnaire and have noted 1. The patient's medical and social history 2. Their use of alcohol, tobacco or illicit drugs 3. Their current medications and supplements 4. The patient's functional ability including ADL's, fall risks, home safety risks and hearing or visual             impairment. 5. Diet and physical activities 6. Evidence for depression or mood disorders  The patients weight, height, BMI have been recorded in the chart and visual acuity is per eye clinic.  I have made referrals, counseling and provided education to the patient based review of the above and I have provided the pt with a written personalized care plan for preventive services. Reviewed and updated provider list, see scanned forms.  See scanned forms.  Routine anticipatory guidance given to patient.  See health maintenance. Colon cancer screening   Colonoscopy 8/19 -adenoma found Breast cancer screening  Mammogram 8/20-will send a reminder Self breast exam-does not check  Flu vaccine -declines  Tetanus vaccine Td 3/19  Pneumovax  -had prevnar 6/16 (had a severe local rxn so not doing the pna 23)  covid vaccines - done in jan/feb pfizer Zoster vaccine- unsure if interested  Dexa 8/20- osteopenia - slt dec in LFN from prior  Falls-one fall in October (sprained ankle) -got foot hung on leg of a chair  Fractures- none  Supplements-has not been good about taking  D level is 29.6 Exercise -not much/ does work in the yard   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   Memory is not as good (short term) - trouble coming up with names of places  She gets confused more easily  Has not forgotten how to do things at all  Still does the finances for household and does sudoko Reads the paper Does not get lost at all    Care team PCP-Mili Piltz [place]   [facility] and [facility] reviewed  Meds, vitals, and allergies reviewed.   ROS: See HPI.  Otherwise negative.    Weight : Wt Readings from Last 3 Encounters:  05/30/20 138 lb 3 oz (62.7 kg)  05/24/19 132 lb 8 oz (60.1 kg)  06/30/18 137 lb (62.1 kg)  appetite is ok/pretty good  Put on 10 lb during pandemic  21.97 kg/m   Hearing/vision:  Hearing Screening   125Hz  250Hz  500Hz  1000Hz  2000Hz  3000Hz  4000Hz  6000Hz  8000Hz   Right ear:   40 40 40  40    Left ear:   40 40 40  40    Vision Screening Comments: [facility] on 02/21/20   fam hx - lymphoma runs strongly   Hypothyroidism  Pt has no clinical changes No change in energy level/ hair or skin/ edema and no tremor Lab Results  Component Value Date   TSH 4.15 05/23/2020     BP Readings from Last 3 Encounters:  05/30/20 124/72  05/24/19 124/70  06/30/18 114/90   Pulse Readings from Last 3 Encounters:  05/30/20 78  05/24/19 71  06/30/18 72    H/o RA Not currently seeing rheumatology or treating  Thinks it I getting worse- fingers  She thinks diet plays a role    H/o thrombocytopenia and leukocytopenia  Lab Results  Component Value Date   WBC 2.3 (L) 05/23/2020   HGB 12.3 05/23/2020   HCT 35.6 (L) 05/23/2020   MCV 93.3 05/23/2020   PLT 160.0 05/23/2020   Nl plt ct this time  Hyperlipidemia Lab Results  Component Value Date   CHOL 205 (H) 05/23/2020   CHOL 201 (H) 05/09/2019   CHOL 203 (H) 01/28/2018   Lab Results  Component Value Date   HDL 60.90 05/23/2020   HDL 60.20 05/09/2019   HDL 67.70  01/28/2018   Lab Results  Component Value Date   LDLCALC 131 (H) 05/23/2020   LDLCALC 123 (H) 05/09/2019   LDLCALC 125 (H) 01/28/2018   Lab Results  Component Value Date   TRIG 65.0 05/23/2020   TRIG 90.0 05/09/2019   TRIG 55.0 01/28/2018   Lab Results  Component Value Date   CHOLHDL 3 05/23/2020   CHOLHDL 3 05/09/2019   CHOLHDL 3 01/28/2018   Lab Results  Component Value Date   LDLDIRECT 163.4 11/11/2013   LDLDIRECT 152.5 08/14/2011   LDLDIRECT 145.0 04/27/2007   Diet control  Had a favorable lipo science profile in the past  She eats some fried foods  Not a lot of red meat      H/o high bilirubin Stable Lab Results  Component Value Date   ALT 10 05/23/2020   AST 18 05/23/2020   ALKPHOS 73 05/23/2020   BILITOT 1.6 (H) 05/23/2020   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 Estrogen deficiency 05/24/2019  . Vitamin D deficiency 08/03/2015  . Routine general medical examination at a health care facility 05/15/2015  . Dyspepsia 08/23/2014  . Abnormal ankle brachial index 08/23/2014  . Rheumatoid arthritis ([place]) 06/19/2014  . Thrombocytopenia ([place]) 06/19/2014  . Encounter for Medicare annual wellness exam 11/11/2013  . Hyperbilirubinemia 08/19/2013  . Hypothyroid 09/21/2012  . NEOPLASMS UNSPEC NATURE BONE SOFT TISSUE&SKIN 10/23/2010  . 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 OTHER SYMPTOMS REFERABLE JOINT SITE UNSPECIFIED 10/23/2010  . CARPAL TUNNEL SYNDROME, HX OF 04/05/2009  . Leukocytopenia 05/02/2008  . DRY MOUTH 04/28/2008  . INSOMNIA, TRANSIENT 04/27/2007  . Osteopenia 04/27/2007  . Hyperlipidemia 04/23/2007  . HIATAL HERNIA 04/23/2007  . POSTMENOPAUSAL STATUS 04/23/2007  . RHEUMATOID FACTOR, POSITIVE 04/23/2007   Past Medical History:  Diagnosis Date  .  Arthritis    rheumatoid arthritis  . Colon adenomas   . Hypothyroidism   . Leukopenia   . Thyroid disease    Past Surgical History:  Procedure Laterality Date  . ABDOMINAL HYSTERECTOMY    . CARPAL TUNNEL RELEASE    . COLONOSCOPY    . COLONOSCOPY WITH PROPOFOL N/A 06/30/2018   Procedure: COLONOSCOPY WITH PROPOFOL;  Surgeon: Manya Silvas, MD;  Location[facility] ENDOSCOPY;  Service: Endoscopy;  Laterality: N/A;  . colpo-ureteroscopy    . lens surgery     Social History   Tobacco Use  . Smoking status: Never Smoker  . Smokeless tobacco: Never Used  Vaping Use  . Vaping  Use: Never used  Substance Use Topics  . Alcohol use: Yes    Alcohol/week: 0.0 standard drinks    Comment: occasional  . Drug use: No   Family History  Problem Relation Age of Onset  . Cancer Mother        lymphoma  . Cancer Brother        lymphoma  . Cancer Daughter        NHL   Allergies  Allergen Reactions  . Cortisporin [Bacitra-Neomycin-Polymyxin-Hc]     Burning / swelling of eyes with eye drops  . Prevnar [Pneumococcal 13-Val Conj Vacc] Other (See Comments)    Severe local reaction   . Sulfonamide Derivatives     REACTION: reaction  not known   Current Outpatient Medications on File Prior to Visit  Medication Sig Dispense Refill  . Cholecalciferol (OPTIMAL-D PO) Take 1 tablet by mouth daily.    . Digestive Aids Mixture (DIGESTION GB) CAPS Take 1 capsule by mouth daily.    Marland Kitchen levothyroxine (SYNTHROID) 50 MCG tablet TAKE 1 TABLET DAILY AND TAKE 2 TABLETS ON SUNDAY 104 tablet 3  . Multiple Vitamins-Minerals (PRESERVISION AREDS 2 PO) Take 2 capsules by mouth daily.     . NON FORMULARY 2 drops daily. Vista  2    . NON FORMULARY Take 1 tablet by mouth daily. Dietary supplement    . OVER THE COUNTER MEDICATION Take 1 tablet by mouth daily. opti-methyl-b    . OVER THE COUNTER MEDICATION Take 1 drop by mouth 2 (two) times a week. vista-2      No current facility-administered medications on file prior  to visit.     Review of Systems  Constitutional: Negative for activity change, appetite change, fatigue, fever and unexpected weight change.  HENT: Negative for congestion, ear pain, rhinorrhea, sinus pressure and sore throat.   Eyes: Negative for pain, redness and visual disturbance.  Respiratory: Negative for cough, shortness of breath and wheezing.   Cardiovascular: Negative for chest pain and palpitations.  Gastrointestinal: Negative for abdominal pain, blood in stool, constipation and diarrhea.  Endocrine: Negative for polydipsia and polyuria.  Genitourinary: Negative for dysuria, frequency and urgency.  Musculoskeletal: Positive for arthralgias. Negative for back pain and myalgias.  Skin: Negative for pallor and rash.  Allergic/Immunologic: Negative for environmental allergies.  Neurological: Negative for dizziness, syncope and headaches.  Hematological: Negative for adenopathy. Does not bruise/bleed easily.  Psychiatric/Behavioral: Positive for decreased concentration. Negative for dysphoric mood. The patient is not nervous/anxious.        Objective:   Physical Exam Constitutional:      General: She is not in acute distress.    Appearance: Normal appearance. She is well-developed and normal weight. She is not ill-appearing or diaphoretic.  HENT:     Head: Normocephalic and atraumatic.     Right Ear: Tympanic membrane, ear canal and external ear normal.     Left Ear: Tympanic membrane, ear canal and external ear normal.     Nose: Nose normal. No congestion.     Mouth/Throat:     Mouth: Mucous membranes are moist.     Pharynx: Oropharynx is clear. No posterior oropharyngeal erythema.  Eyes:     General: No scleral icterus.    Extraocular Movements: Extraocular movements intact.     Conjunctiva/sclera: Conjunctivae normal.     Pupils: Pupils are equal, round, and reactive to light.  Neck:     Thyroid: No thyromegaly.     Vascular: No carotid bruit or JVD.  Cardiovascular:     Rate and Rhythm: Normal rate and regular rhythm.     Pulses: Normal pulses.     Heart sounds: Normal heart sounds. No gallop.   Pulmonary:     Effort: Pulmonary effort is normal. No respiratory distress.     Breath sounds: Normal breath sounds. No wheezing.     Comments: Good air exch Chest:     Chest wall: No tenderness.  Abdominal:     General: Bowel sounds are normal. There is no distension or abdominal bruit.     Palpations: Abdomen is soft. There is no mass.     Tenderness: There is no abdominal tenderness.     Hernia: No hernia is present.  Genitourinary:    Comments: Breast exam: No mass, nodules, thickening, tenderness, bulging, retraction, inflamation, nipple discharge or skin changes noted.  No axillary or clavicular LA.     Musculoskeletal:        General: No tenderness. Normal range of motion.     Cervical back: Normal range of motion and neck supple. No rigidity. No muscular tenderness.     Right lower leg: No edema.     Left lower leg: No edema.     Comments: Mild kyphosis   Lymphadenopathy:     Cervical: No cervical adenopathy.  Skin:    General: Skin is warm and dry.     Coloration: Skin is not pale.     Findings: No erythema or rash.     Comments: Solar lentigines diffusely Some sks  Neurological:     Mental Status: She is alert. Mental status is at baseline.     Cranial Nerves: No cranial nerve deficit.     Motor: No abnormal muscle tone.     Coordination: Coordination normal.     Gait: Gait normal.     Deep Tendon Reflexes: Reflexes are normal and symmetric. Reflexes normal.  Psychiatric:        Attention and Perception: Attention normal.        Mood and Affect: Mood normal.        Behavior: Behavior normal.     Comments: Cognition is slower than prev but not seemingly impaired  Pleasant  Confuses easily           Assessment & Plan:   Problem List Items Addressed This Visit      Endocrine   Hypothyroid     Hypothyroidism  Pt has no clinical changes No change in energy level/ hair or skin/ edema and no tremor Lab Results  Component Value Date   TSH 4.15 05/23/2020             Musculoskeletal and Integument   Osteopenia    dexa 8//20  One fall and no fx  Disc fall prev  Not compliant with vit D suppl and level is low-stressed imp of this  Plans to start walking more as tolerated      Rheumatoid arthritis ([place])    Not currently treating  Not interested in rheum ref yet         Other   Hyperlipidemia    Disc goals for lipids and reasons to control them Rev last labs with pt Rev low sat fat diet in detail LDL in 130s Favorable lipo science profile  Wants to avoid medication        Leukocytopenia    Suspect due to RA Not symptomatic  Continue to follow      Hyperbilirubinemia    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  No change  Level of 1.6       Encounter for Medicare annual wellness exam - Primary    Reviewed health habits including diet and exercise and skin cancer prevention Reviewed appropriate screening tests for age  Also reviewed health mt list, fam hx and immunization status , as well as social and family history   See HPI Labs reviewed Reminded to schedule mammogram for august  No pna 23 in light of severe local rxn to the prevnar Did get covid vaccines Thinking about shingrix  Disc dexa and fall prevention as well as vitamin D intake and exercise  Rev adv directive-is up to date  Pt has worries about memory and cognition-suggest f/u for full mms exam (today not bad)  Reviewed care team  Good hearing screen  utd vision and eye exams       Thrombocytopenia ([place])    Improved today with nl ct of 160      Routine general medical examination at a health care facility    Reviewed health habits including diet and exercise and skin cancer prevention Reviewed appropriate screening tests for age  Also reviewed health mt list, fam hx and immunization status , as well as  social and family history   See HPI Labs reviewed Reminded to schedule mammogram for august  No pna 23 in light of severe local rxn to the prevnar Did get covid vaccines Thinking about shingrix  Disc dexa and fall prevention as well as vitamin D intake and exercise  Rev adv directive-is up to date  Pt has worries about memory and cognition-suggest f/u for full mms exam (today not bad)  Reviewed care team  Good hearing screen  utd vision and eye exams          Vitamin D deficiency    Level 29.6 Enc pt to be more compliant with daily dosing of D Stressed imp for bone and overall health  inst to keep a pill box

## 2020-05-30 NOTE — Assessment & Plan Note (Signed)
dexa 8//20  One fall and no fx  Disc fall prev  Not compliant with vit D suppl and level is low-stressed imp of this  Plans to start walking more as tolerated

## 2020-05-30 NOTE — Assessment & Plan Note (Signed)
Improved today with nl ct of 160

## 2020-05-30 NOTE — Assessment & Plan Note (Signed)
Disc goals for lipids and reasons to control them Rev last labs with pt Rev low sat fat diet in detail LDL in 130s Favorable lipo science profile  Wants to avoid medication

## 2020-05-30 NOTE — Assessment & Plan Note (Signed)
Hypothyroidism  Pt has no clinical changes No change in energy level/ hair or skin/ edema and no tremor Lab Results  Component Value Date   TSH 4.15 05/23/2020

## 2020-08-02 ENCOUNTER — Encounter: Payer: Self-pay | Admitting: Family Medicine

## 2020-08-22 ENCOUNTER — Encounter: Payer: Self-pay | Admitting: Family Medicine

## 2020-08-24 ENCOUNTER — Encounter: Payer: Self-pay | Admitting: *Deleted

## 2020-08-24 DIAGNOSIS — Z170 Estrogen receptor positive status [ER+]: Secondary | ICD-10-CM | POA: Insufficient documentation

## 2020-08-24 DIAGNOSIS — C50412 Malignant neoplasm of upper-outer quadrant of left female breast: Secondary | ICD-10-CM

## 2020-08-28 ENCOUNTER — Telehealth: Payer: Self-pay | Admitting: Family Medicine

## 2020-08-28 NOTE — Telephone Encounter (Signed)
Pt is having an upcoming surgical procedure but would like to talk to you first and is requesting you to call her.  Please call her on [phone] or [phone].    Thank you!

## 2020-08-28 NOTE — Progress Notes (Signed)
[facility]   [phone] Fax:([phone]) [place] Note   Patient Care Team: Tower, Wynelle Fanny, MD as PCP - General Lad, Caesar Bookman, MD as Referring Physician (Ophthalmology) Birder Robson, MD as Referring Physician (Ophthalmology) Worthy Keeler, DC as Consulting Physician (Chiropractic Medicine) Mauro Kaufmann, RN as Oncology Nurse Navigator Rockwell Germany, RN as Oncology Nurse Navigator Jovita Kussmaul, MD as Consulting Physician (General Surgery) Truitt Merle, MD as Consulting Physician (Hematology) Kyung Rudd, MD as Consulting Physician (Radiation Oncology)  Date of Service:  08/29/2020   CHIEF COMPLAINTS/PURPOSE OF CONSULTATION:  Newly diagnosed Malignant neoplasm of upper-outer quadrant of left breast   Oncology History Overview Note  Cancer Staging Malignant neoplasm of upper-outer quadrant of left breast in female, estrogen receptor positive ([place]) Staging form: Breast, AJCC 8th Edition - Clinical stage from 08/29/2020: Stage IA (cT1c, cN0, cM0, G1, ER+, PR+, HER2-) - Unsigned    Malignant neoplasm of upper-outer quadrant of left breast in female, estrogen receptor positive ([place])  08/02/2020 Mammogram   IMPRESSION The 1cm (0.9x1.2x0.9cm on US)asymmetry in the left breast at 1:00 position, 4 cmfn is indeterminate   08/22/2020 Initial Biopsy   Diagnosis 08/22/20 Breast, left, needle core biopsy, 1 o'clock, 4cmfn -INVASIVE MAMMARY CARCINOMA -SEE COMMENT       -Grade 1 or 3  E-cadherin is NEGATIVE supporting lobular origin   08/22/2020 Receptors her2   ER - 95%  PR - 25%  Ki67 - 10% HER2 Negative on [facility]   08/24/2020 Initial Diagnosis   Malignant neoplasm of upper-outer quadrant of left breast in female, estrogen receptor positive ([place])      HISTORY OF PRESENTING ILLNESS:  Jennifer Sellers 74 y.o. female is a here because of newly diagnosed left breast cancer. The patient presents to the Breast clinic today  accompanied by family member, likely ddaughter.  Her left breast mass was found by screening mammogram. She has been having mammograms yearly with no abnormal exams before. She denies feeling the mass herself. She denies nipple, breast or skin changes. She notes she was exercising with pole saw and notes her left chest muscle was irritated.  Today the patient notes she will be SOB with exertion like walking up hill.   Socially she is married who she lives with. She is very active and able to take care of herself and help with her husband who has newly diagnosed Parkinson's disease. She is a non smoker. She has a PMHx of Rheumatoid arthritis which has been very tolerable lately. She was diagnosed 5 years ago but has not followed up with her Rheumatologist in a few years. She uses liquid Vista as needed and use warm compress. She does not require cane and can ambulate well. She had a mini stroke in 2011 and recovered well. She has h/o concerning polyps and will repeat colonoscopy soon. She had hysterectomy due to fibroid tumor and tubal ligation. Her sister and brother and daughter had lymphoma.  I reviewed her medication list with her.    GYN HISTORY  Menarchal: 12 LMP: from Hysterectomy Contraceptive: Yes HRT: No G2P: first at age 14    REVIEW OF SYSTEMS:    Constitutional: Denies fevers, chills or abnormal night sweats Eyes: Denies blurriness of vision, double vision or watery eyes Ears, nose, mouth, throat, and face: Denies mucositis or sore throat Respiratory: Denies cough, dyspnea or wheezes Cardiovascular: Denies palpitation, chest discomfort or lower extremity swelling Gastrointestinal:  Denies nausea, heartburn or change  in bowel habits Skin: Denies abnormal skin rashes MSK: (+) RA, stable and tolerable.  Lymphatics: Denies new lymphadenopathy or easy bruising Neurological:Denies numbness, tingling or new weaknesses Behavioral/Psych: Mood is stable, no new changes  All other  systems were reviewed with the patient and are negative.  MEDICAL HISTORY:  Past Medical History:  Diagnosis Date  . Arthritis    rheumatoid arthritis  . Hypothyroidism   . Leukopenia   . Thyroid disease   . TIA (transient ischemic attack) 2011    SURGICAL HISTORY: Past Surgical History:  Procedure Laterality Date  . ABDOMINAL HYSTERECTOMY    . CARPAL TUNNEL RELEASE    . COLONOSCOPY    . COLONOSCOPY WITH PROPOFOL N/A 06/30/2018   Procedure: COLONOSCOPY WITH PROPOFOL;  Surgeon: Scot Jun, MD;  Location: [facility] ENDOSCOPY;  Service: Endoscopy;  Laterality: N/A;  . colpo-ureteroscopy    . lens surgery      SOCIAL HISTORY: Social History   Socioeconomic History  . Marital status: Married    Spouse name: Not on file  . Number of children: 2  . Years of education: Not on file  . Highest education level: Not on file  Occupational History  . Not on file  Tobacco Use  . Smoking status: Never Smoker  . Smokeless tobacco: Never Used  Vaping Use  . Vaping Use: Never used  Substance and Sexual Activity  . Alcohol use: Yes    Alcohol/week: 0.0 standard drinks    Comment: occasional  . Drug use: No  . Sexual activity: Yes  Other Topics Concern  . Not on file  Social History Narrative  . Not on file   Social Determinants of Health   Financial Resource Strain:   . Difficulty of Paying Living Expenses: Not on file  Food Insecurity:   . Worried About Programme researcher, broadcasting/film/video in the Last Year: Not on file  . Ran Out of Food in the Last Year: Not on file  Transportation Needs:   . Lack of Transportation (Medical): Not on file  . Lack of Transportation (Non-Medical): Not on file  Physical Activity:   . Days of Exercise per Week: Not on file  . Minutes of Exercise per Session: Not on file  Stress:   . Feeling of Stress : Not on file  Social Connections:   . Frequency of Communication with Friends and Family: Not on file  . Frequency of Social Gatherings with Friends and  Family: Not on file  . Attends Religious Services: Not on file  . Active Member of Clubs or Organizations: Not on file  . Attends Banker Meetings: Not on file  . Marital Status: Not on file  Intimate Partner Violence:   . Fear of Current or Ex-Partner: Not on file  . Emotionally Abused: Not on file  . Physically Abused: Not on file  . Sexually Abused: Not on file    FAMILY HISTORY: Family History  Problem Relation Age of Onset  . Cancer Mother        lymphoma  . Cancer Brother        lymphoma  . Cancer Daughter        NHL    ALLERGIES:  is allergic to cipro [ciprofloxacin hcl], cortisporin [bacitra-neomycin-polymyxin-hc], prevnar [pneumococcal 13-val conj vacc], and sulfonamide derivatives.  MEDICATIONS:  Current Outpatient Medications  Medication Sig Dispense Refill  . OVER THE COUNTER MEDICATION Osteo-mins PM with D+K1+K2    . OVER THE COUNTER MEDICATION Osteo mins pm  calcium    . OVER THE COUNTER MEDICATION Thore Pic Mins    . OVER THE COUNTER MEDICATION Optimal longevi D K2    . OVER THE COUNTER MEDICATION Liqua A    . Cholecalciferol (OPTIMAL-D PO) Take 1 tablet by mouth daily.    . Digestive Aids Mixture (DIGESTION GB) CAPS Take 1 capsule by mouth daily.    Marland Kitchen levothyroxine (SYNTHROID) 50 MCG tablet TAKE 1 TABLET DAILY AND TAKE 2 TABLETS ON SUNDAY 104 tablet 3  . LORazepam (ATIVAN) 1 MG tablet Take 1 tablet (1 mg total) by mouth once for 1 dose. 2 tablet 0  . Multiple Vitamins-Minerals (PRESERVISION AREDS 2 PO) Take 2 capsules by mouth daily.     . NON FORMULARY 2 drops daily. Vista  2    . NON FORMULARY Take 1 tablet by mouth daily. Dietary supplement    . OVER THE COUNTER MEDICATION Take 1 tablet by mouth daily. opti-methyl-b    . OVER THE COUNTER MEDICATION Take 1 drop by mouth 2 (two) times a week. vista-2      No current facility-administered medications for this visit.    PHYSICAL EXAMINATION: ECOG PERFORMANCE STATUS: 1 - Symptomatic but  completely ambulatory  Vitals:   08/29/20 1248  BP: 140/81  Pulse: 72  Resp: 17  Temp: 97.9 F (36.6 C)  SpO2: 98%   Filed Weights   08/29/20 1248  Weight: 135 lb 3.2 oz (61.3 kg)   GENERAL:alert, no distress and comfortable SKIN: skin color, texture, turgor are normal, no rashes or significant lesions EYES: normal, Conjunctiva are pink and non-injected, sclera clear  NECK: supple, thyroid normal size, non-tender, without nodularity LYMPH:  no palpable lymphadenopathy in the cervical, axillary  LUNGS: clear to auscultation and percussion with normal breathing effort HEART: regular rate & rhythm and no murmurs and no lower extremity edema ABDOMEN:abdomen soft, non-tender and normal bowel sounds Musculoskeletal:no cyanosis of digits and no clubbing  NEURO: alert & oriented x 3 with fluent speech, no focal motor/sensory deficits BREAST: (+) Skin ecchymosis at biopsy site with 2cm lump, likely hematoma. Right Breast exam benign.  LABORATORY DATA:  I have reviewed the data as listed CBC Latest Ref Rng & Units 08/29/2020 05/23/2020 05/09/2019  WBC 4.0 - 10.5 K/uL 3.1(L) 2.3(L) 3.0(L)  Hemoglobin 12.0 - 15.0 g/dL 12.0 12.3 12.8  Hematocrit 36 - 46 % 35.0(L) 35.6(L) 37.6  Platelets 150 - 400 K/uL 151 160.0 166.0    CMP Latest Ref Rng & Units 08/29/2020 05/23/2020 05/09/2019  Glucose 70 - 99 mg/dL 86 97 86  BUN 8 - 23 mg/dL $Remove'10 15 16  'mzKZfgW$ Creatinine 0.44 - 1.00 mg/dL 0.93 0.88 0.91  Sodium 135 - 145 mmol/L 142 141 141  Potassium 3.5 - 5.1 mmol/L 4.0 4.7 4.3  Chloride 98 - 111 mmol/L 106 106 106  CO2 22 - 32 mmol/L 32 30 30  Calcium 8.9 - 10.3 mg/dL 8.9 8.8 8.8  Total Protein 6.5 - 8.1 g/dL 6.6 6.3 6.3  Total Bilirubin 0.3 - 1.2 mg/dL 2.1(H) 1.6(H) 1.6(H)  Alkaline Phos 38 - 126 U/L 79 73 76  AST 15 - 41 U/L $Remo'18 18 19  'QSuaD$ ALT 0 - 44 U/L $Remo'8 10 11     'dFRFF$ RADIOGRAPHIC STUDIES: I have personally reviewed the radiological images as listed and agreed with the findings in the report. No results  found.  ASSESSMENT & PLAN:  Jennifer Sellers is a 74 y.o. Caucasian female with a history of Arthritis, hypothyroidism, Leukopenia  1. Malignant neoplasm of upper-outer quadrant of left breast, invasive lobular carcinoma stage IA, c(T1cN0M0), ER+/PR+/HER2-, Grade I -We discussed her image findings and the biopsy results in great details. She has a 1.2cm invasive lobular carcinoma, grade I.  -Given Lobular disease breast MRI is recommended to evaluate for multifocal or contralateral breast cancer and LN involvement.  -Given the early stage disease, she is likely a candidate for breast conservation surgery.  Due to the lobular histology, we also recommend sentinel LN biopsy. She is agreeable with that. She was seen by Dr. Carolynne Edouard today and likely will proceed with surgery soon.  -We reviewed the nature of early stage lobular carcinoma, and the risk of recurrence after surgery.  We discussed the role of Oncotype Dx testing to evaluate her risk of recurrence and benefit of adjuvant chemotherapy.  We discussed that lobular histology is less sensitive to chemotherapy, and given her advanced age, I will not recommend adjuvant chemotherapy.  However she is interested in knowing her risk of recurrence, so it would be reasonable to obtain Oncotype DX test (or MammaPrint if she has got the ) on her surgical sample. -The risk of recurrence depends on the stage and biology of the tumor. She is early stage, with ER/PR positive and HER2 negative markers. I discussed this is likely low risk disease  -She was also seen by radiation oncologist Dr. Mitzi Hansen today. She will discuss option of Adjuvant radiation after lumpectomy to reduce her risk of local recurrence. Given her age, it is understandable to forego RT with node negative disease.  -Given the strong ER and PR expression in her postmenopausal status, I recommend adjuvant endocrine therapy with aromatase inhibitor with Anastrozole or Exemestane for a total of 5-10 years  to reduce the risk of cancer recurrence.  Given her lobular histology, and the risk of late recurrence, I recommend 10 years of AI if she is able to tolerate well.  --The potential benefit and side effects, which includes but not limited to, hot flash, skin and vaginal dryness, metabolic changes ( increased blood glucose, cholesterol, weight, etc.), slightly in increased risk of cardiovascular disease, cataracts, muscular and joint discomfort, osteopenia and osteoporosis, etc, were discussed with her in great details. She is interested, and we'll start after she completes radiation. -Patient had many questions and I answered to her satisfaction. -We also discussed the breast cancer surveillance after her surgery. She will continue annual screening mammogram, self exam, and a routine office visit with lab and exam with [place]. - Labs reviewed, CBC and CMP WNL except WBC 3.1, Tbili 2.1, which are chronic and stable.  -F/u after surgery or radiation    2. Comorbidities: Rheumatoid arthritis, Hypothyroidism -She does not f/u with Rheumatologist given mild symptoms. She does not require pain medication -On thyroid replacement.    PLAN:  -I prescribed Ativan (#2 tabs) for MRI scan  -Breast MRI in 1-2 weeks. -Proceed with surgery    -Will obtain Oncotype (for MammaPrint if node positive) on her surgical sample  -F/u after surgery or radiation    No orders of the defined types were placed in this encounter.   All questions were answered. The patient knows to call the clinic with any problems, questions or concerns. The total time spent in the appointment was 60 minutes.     Malachy Mood, MD 08/29/2020 6:06 PM  I, Delphina Cahill, am acting as scribe for Malachy Mood, MD.   I have reviewed the above documentation for accuracy and completeness, and  I agree with the above.

## 2020-08-28 NOTE — Telephone Encounter (Signed)
We discussed a new diagnosis of breast cancer in L breast  I have the [facility] report from US guided bx but have not rec the pathology report yet  She has an appt with Dr Marlou Starks tomorrow and there will be a team to help with oncol /etc  I answered some questions and validated her anxiety over this (she has excellent support from 2 from 2 daughters  I will watch for correspondence and updates  If she wants a 2nd opinion at any time she will reach out as well

## 2020-08-29 ENCOUNTER — Encounter: Payer: Self-pay | Admitting: *Deleted

## 2020-08-29 ENCOUNTER — Other Ambulatory Visit: Payer: Self-pay | Admitting: *Deleted

## 2020-08-29 ENCOUNTER — Inpatient Hospital Stay: Payer: Medicare Other | Attending: Hematology | Admitting: Hematology

## 2020-08-29 ENCOUNTER — Ambulatory Visit
Admission: RE | Admit: 2020-08-29 | Discharge: 2020-08-29 | Disposition: A | Discharge status: Home / Self Care | Payer: Medicare Other | Source: Ambulatory Visit | Attending: Radiation Oncology | Admitting: Radiation Oncology

## 2020-08-29 ENCOUNTER — Encounter: Payer: Self-pay | Admitting: Hematology

## 2020-08-29 ENCOUNTER — Ambulatory Visit: Payer: Medicare Other | Attending: General Surgery | Admitting: Physical Therapy

## 2020-08-29 ENCOUNTER — Other Ambulatory Visit: Payer: Self-pay

## 2020-08-29 ENCOUNTER — Ambulatory Visit: Payer: Self-pay | Admitting: General Surgery

## 2020-08-29 ENCOUNTER — Encounter: Payer: Self-pay | Admitting: Physical Therapy

## 2020-08-29 ENCOUNTER — Inpatient Hospital Stay: Payer: Medicare Other

## 2020-08-29 VITALS — BP 140/81 | HR 72 | Temp 97.9°F | Resp 17 | Ht 66.5 in | Wt 135.2 lb

## 2020-08-29 DIAGNOSIS — C50412 Malignant neoplasm of upper-outer quadrant of left female breast: Secondary | ICD-10-CM

## 2020-08-29 DIAGNOSIS — M069 Rheumatoid arthritis, unspecified: Secondary | ICD-10-CM | POA: Diagnosis not present

## 2020-08-29 DIAGNOSIS — Z170 Estrogen receptor positive status [ER+]: Secondary | ICD-10-CM

## 2020-08-29 DIAGNOSIS — D72819 Decreased white blood cell count, unspecified: Secondary | ICD-10-CM | POA: Diagnosis not present

## 2020-08-29 DIAGNOSIS — R0609 Other forms of dyspnea: Secondary | ICD-10-CM | POA: Diagnosis not present

## 2020-08-29 DIAGNOSIS — Z8673 Personal history of transient ischemic attack (TIA), and cerebral infarction without residual deficits: Secondary | ICD-10-CM | POA: Diagnosis not present

## 2020-08-29 DIAGNOSIS — Z79899 Other long term (current) drug therapy: Secondary | ICD-10-CM | POA: Insufficient documentation

## 2020-08-29 DIAGNOSIS — R293 Abnormal posture: Secondary | ICD-10-CM

## 2020-08-29 DIAGNOSIS — E039 Hypothyroidism, unspecified: Secondary | ICD-10-CM | POA: Diagnosis not present

## 2020-08-29 LAB — CMP (CANCER CENTER ONLY)
ALT: 8 U/L (ref 0–44)
AST: 18 U/L (ref 15–41)
Albumin: 3.8 g/dL (ref 3.5–5.0)
Alkaline Phosphatase: 79 U/L (ref 38–126)
Anion gap: 4 — ABNORMAL LOW (ref 5–15)
BUN: 10 mg/dL (ref 8–23)
CO2: 32 mmol/L (ref 22–32)
Calcium: 8.9 mg/dL (ref 8.9–10.3)
Chloride: 106 mmol/L (ref 98–111)
Creatinine: 0.93 mg/dL (ref 0.44–1.00)
GFR, Estimated: 60 mL/min — ABNORMAL LOW (ref >60)
Glucose, Bld: 86 mg/dL (ref 70–99)
Potassium: 4 mmol/L (ref 3.5–5.1)
Sodium: 142 mmol/L (ref 135–145)
Total Bilirubin: 2.1 mg/dL — ABNORMAL HIGH (ref 0.3–1.2)
Total Protein: 6.6 g/dL (ref 6.5–8.1)

## 2020-08-29 LAB — CBC WITH DIFFERENTIAL (CANCER CENTER ONLY)
Abs Immature Granulocytes: 0.01 10*3/uL (ref 0.00–0.07)
Basophils Absolute: 0 10*3/uL (ref 0.0–0.1)
Basophils Relative: 1 %
Eosinophils Absolute: 0.1 10*3/uL (ref 0.0–0.5)
Eosinophils Relative: 2 %
HCT: 35 % — ABNORMAL LOW (ref 36.0–46.0)
Hemoglobin: 12 g/dL (ref 12.0–15.0)
Immature Granulocytes: 0 %
Lymphocytes Relative: 32 %
Lymphs Abs: 1 10*3/uL (ref 0.7–4.0)
MCH: 32.1 pg (ref 26.0–34.0)
MCHC: 34.3 g/dL (ref 30.0–36.0)
MCV: 93.6 fL (ref 80.0–100.0)
Monocytes Absolute: 0.2 10*3/uL (ref 0.1–1.0)
Monocytes Relative: 7 %
Neutro Abs: 1.8 10*3/uL (ref 1.7–7.7)
Neutrophils Relative %: 58 %
Platelet Count: 151 10*3/uL (ref 150–400)
RBC: 3.74 MIL/uL — ABNORMAL LOW (ref 3.87–5.11)
RDW: 12.9 % (ref 11.5–15.5)
WBC Count: 3.1 10*3/uL — ABNORMAL LOW (ref 4.0–10.5)
nRBC: 0 % (ref 0.0–0.2)

## 2020-08-29 LAB — GENETIC SCREENING ORDER

## 2020-08-29 MED ORDER — LORAZEPAM 1 MG PO TABS: 1.0000 mg | ORAL_TABLET | Freq: Once | ORAL | 0 refills | Status: AC

## 2020-08-29 NOTE — Progress Notes (Signed)
Radiation Oncology         [phone] ________________________________  Name: Jennifer Sellers        MRN: [id number]  Date of Service: 08/29/2020 DOB: [birth year]  CC:Tower, Wynelle Fanny, MD  Jovita Kussmaul, MD     REFERRING PHYSICIAN: Autumn Messing III, MD   DIAGNOSIS: The encounter diagnosis was Malignant neoplasm of upper-outer quadrant of left breast in female, estrogen receptor positive ([place]).   HISTORY OF PRESENT ILLNESS: Jennifer Sellers is a 74 y.o. female seen in the multidisciplinary breast clinic for a new diagnosis of left breast cancer. The patient was noted to have a focal asymmetry in the left breast in the upper outer quadrant which prompted further diagnostic imaging on 08/02/2020, this area measured approximately 1.2 cm, and by ultrasound was located at the 1 o'clock position and measured 9 x 12 x 9 mm.  Her axilla was negative for adenopathy she underwent biopsy of the lung lesion which revealed a grade 1 invasive lobular carcinoma.  The tumor was ER/PR positive, equivocal by IHC and negative by FISH for HER-2, her Ki-67 was 10%.  She is seen today to discuss treatment options for her cancer.  PREVIOUS RADIATION THERAPY: No   PAST MEDICAL HISTORY:  Past Medical History:  Diagnosis Date  . Arthritis    rheumatoid arthritis  . Colon adenomas   . Hypothyroidism   . Leukopenia   . Thyroid disease        PAST SURGICAL HISTORY: Past Surgical History:  Procedure Laterality Date  . ABDOMINAL HYSTERECTOMY    . CARPAL TUNNEL RELEASE    . COLONOSCOPY    . COLONOSCOPY WITH PROPOFOL N/A 06/30/2018   Procedure: COLONOSCOPY WITH PROPOFOL;  Surgeon: Manya Silvas, MD;  Location[facility] ENDOSCOPY;  Service: Endoscopy;  Laterality: N/A;  . colpo-ureteroscopy    . lens surgery       FAMILY HISTORY:  Family History  Problem Relation Age of Onset  . Cancer Mother        lymphoma  . Cancer Brother        lymphoma  . Cancer Daughter        NHL     SOCIAL HISTORY:  reports  that she has never smoked. She has never used smokeless tobacco. She reports current alcohol use. She reports that she does not use drugs. The patient is married and lives in [place]. She is accompanied by her daughter.   ALLERGIES: Cortisporin [bacitra-neomycin-polymyxin-hc], Prevnar [pneumococcal 13-val conj vacc], and Sulfonamide derivatives   MEDICATIONS:  Current Outpatient Medications  Medication Sig Dispense Refill  . Cholecalciferol (OPTIMAL-D PO) Take 1 tablet by mouth daily.    . Digestive Aids Mixture (DIGESTION GB) CAPS Take 1 capsule by mouth daily.    Marland Kitchen levothyroxine (SYNTHROID) 50 MCG tablet TAKE 1 TABLET DAILY AND TAKE 2 TABLETS ON SUNDAY 104 tablet 3  . Multiple Vitamins-Minerals (PRESERVISION AREDS 2 PO) Take 2 capsules by mouth daily.     . NON FORMULARY 2 drops daily. Vista  2    . NON FORMULARY Take 1 tablet by mouth daily. Dietary supplement    . OVER THE COUNTER MEDICATION Take 1 tablet by mouth daily. opti-methyl-b    . OVER THE COUNTER MEDICATION Take 1 drop by mouth 2 (two) times a week. vista-2      No current facility-administered medications for this encounter.     REVIEW OF SYSTEMS: On review of systems, the patient reports that she is doing well  overall. No specific breast complaints are noted at this time.     PHYSICAL EXAM:  Wt Readings from Last 3 Encounters:  05/30/20 138 lb 3 oz (62.7 kg)  05/24/19 132 lb 8 oz (60.1 kg)  06/30/18 137 lb (62.1 kg)   Temp Readings from Last 3 Encounters:  05/30/20 (!) 96.9 F (36.1 C) (Temporal)  05/24/19 (!) 97.2 F (36.2 C) (Tympanic)  06/30/18 (!) 97.1 F (36.2 C) (Tympanic)   BP Readings from Last 3 Encounters:  05/30/20 124/72  05/24/19 124/70  06/30/18 114/90   Pulse Readings from Last 3 Encounters:  05/30/20 78  05/24/19 71  06/30/18 72    In general this is a well appearing Caucasian female in no acute distress. She's alert and oriented x4 and appropriate throughout the examination.  Cardiopulmonary assessment is negative for acute distress and she exhibits normal effort. Bilateral breast exam is deferred.    ECOG = 0  0 - Asymptomatic (Fully active, able to carry on all predisease activities without restriction)  1 - Symptomatic but completely ambulatory (Restricted in physically strenuous activity but ambulatory and able to carry out work of a light or sedentary nature. For example, light housework, office work)  2 - Symptomatic, <50% in bed during the day (Ambulatory and capable of all self care but unable to carry out any work activities. Up and about more than 50% of waking hours)  3 - Symptomatic, >50% in bed, but not bedbound (Capable of only limited self-care, confined to bed or chair 50% or more of waking hours)  4 - Bedbound (Completely disabled. Cannot carry on any self-care. Totally confined to bed or chair)  5 - Death   Eustace Pen MM, Creech RH, Tormey DC, et al. [phone]). "Toxicity and response criteria of the [facility]". [place] Oncol. 5 (6): 649-55    LABORATORY DATA:  Lab Results  Component Value Date   WBC 2.3 (L) 05/23/2020   HGB 12.3 05/23/2020   HCT 35.6 (L) 05/23/2020   MCV 93.3 05/23/2020   PLT 160.0 05/23/2020   Lab Results  Component Value Date   NA 141 05/23/2020   K 4.7 05/23/2020   CL 106 05/23/2020   CO2 30 05/23/2020   Lab Results  Component Value Date   ALT 10 05/23/2020   AST 18 05/23/2020   ALKPHOS 73 05/23/2020   BILITOT 1.6 (H) 05/23/2020      RADIOGRAPHY: No results found.     IMPRESSION/PLAN: 1. Stage IA, cT1cN0M0 grade 1, ER/PR positive invasive lobular carcinoma of the right breast. Dr. Lisbeth Renshaw discusses the pathology findings and reviews the nature of invasive lobular breast disease. The consensus from the breast conference includes proceeding with an MRI to determine extent of disease.  Dr. Marlou Starks feels that she is a good candidate for breast conservation with lumpectomy with sentinel  node biopsy. Dr. Burr Medico plans to proceed with Oncotype Dx score to determine a role for systemic therapy. Dr. Lisbeth Renshaw reviews the rationale for external radiotherapy to the breast followed by antiestrogen therapy in order to reduce the risks of recurrence.  He also details scenarios for which patients may have favorable disease and can forego radiotherapy.  We will follow-up with the final results of pathology before making this final determination. We discussed the risks, benefits, short, and [place] term effects of radiotherapy, and the patient is interested in considering her options but may wish to proceed. Dr. Lisbeth Renshaw discusses the delivery and logistics of radiotherapy and anticipates  a course of 4 or 6 1/2 weeks of radiotherapy, leaning toward 4 weeks based on the current workup. We will see her back a few weeks after surgery to discuss the simulation process and anticipate we starting radiotherapy about 4-6 weeks after surgery.  2. Rheumatoid arthritis. The patient does not take immunomodulators but if this were to change, this could impact how she might proceed with radiation.  In a visit lasting 60 minutes, greater than 50% of the time was spent face to face reviewing her case, as well as in preparation of, discussing, and coordinating the patient's care.  The above documentation reflects my direct findings during this shared patient visit. Please see the separate note by Dr. Lisbeth Renshaw on this date for the remainder of the patient's plan of care.    Carola Rhine, PAC

## 2020-08-29 NOTE — Patient Instructions (Signed)

## 2020-08-29 NOTE — Therapy (Signed)
[place], [place], [address] Phone: [phone]   Fax:  [phone]  Physical Therapy Evaluation  Patient Details  Name: Jennifer Sellers MRN: [id number] Date of Birth: Apr 07, 1946 Referring Provider (PT): Dr. Autumn Messing   Encounter Date: 08/29/2020   PT End of Session - 08/29/20 2213    Visit Number 1    Number of Visits 2    Date for PT Re-Evaluation 10/24/20    PT Start Time 0102    PT Stop Time 1615    PT Time Calculation (min) 33 min    Activity Tolerance Patient tolerated treatment well    Behavior During Therapy [facility] for tasks assessed/performed           Past Medical History:  Diagnosis Date  . Arthritis    rheumatoid arthritis  . Hypothyroidism   . Leukopenia   . Thyroid disease   . TIA (transient ischemic attack) 2011    Past Surgical History:  Procedure Laterality Date  . ABDOMINAL HYSTERECTOMY    . CARPAL TUNNEL RELEASE    . COLONOSCOPY    . COLONOSCOPY WITH PROPOFOL N/A 06/30/2018   Procedure: COLONOSCOPY WITH PROPOFOL;  Surgeon: Manya Silvas, MD;  Location: [facility] ENDOSCOPY;  Service: Endoscopy;  Laterality: N/A;  . colpo-ureteroscopy    . lens surgery      There were no vitals filed for this visit.    Subjective Assessment - 08/29/20 2207    Subjective Patient reports she is here today to be seen by her medical team for her newly diagnosed left breast cancer.    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 It measures 1.2 cm of asymmetry and is located in the upper outer quadrant. It is ER/PR positive and HER2 negative with a Ki67 of 10%.    Patient Stated Goals Reduce lymphedema risk and learn post op shoulder ROM HEP    Currently in Pain? No/denies              [facility] PT Assessment - 08/29/20 0001      Assessment   Medical Diagnosis Left breast  cancer    Referring Provider (PT) Dr. Autumn Messing    Onset Date/Surgical Date 07/16/20    Hand Dominance Right    Prior Therapy none      Precautions   Precautions Other (comment)    Precaution Comments active cancer      Restrictions   Weight Bearing Restrictions No      Balance Screen   Has the patient fallen in the past 6 months No    Has the patient had a decrease in activity level because of a fear of falling?  No    Is the patient reluctant to leave their home because of a fear of falling?  No      Home Ecologist residence    Living Arrangements Spouse/significant other    Available Help at Discharge Family      Prior Function   Level of [place] Retired    Leisure She does not exercise      Cognition   Overall Cognitive Status Within Functional Limits for tasks assessed      Posture/Postural Control   Posture/Postural Control Postural limitations    Postural Limitations Rounded Shoulders;Forward head;Increased thoracic kyphosis  ROM / Strength   AROM / PROM / Strength AROM;Strength      AROM   Overall AROM Comments Left cervical rotation limited 25%; others WNL    AROM Assessment Site Shoulder    Right/Left Shoulder Left;Right    Right Shoulder Extension 69 Degrees    Right Shoulder Flexion 150 Degrees    Right Shoulder ABduction 152 Degrees    Right Shoulder Internal Rotation 69 Degrees    Right Shoulder External Rotation 77 Degrees    Left Shoulder Extension 72 Degrees    Left Shoulder Flexion 140 Degrees    Left Shoulder ABduction 158 Degrees    Left Shoulder Internal Rotation 78 Degrees    Left Shoulder External Rotation 78 Degrees      Strength   Overall Strength Within functional limits for tasks performed             LYMPHEDEMA/ONCOLOGY QUESTIONNAIRE - 08/29/20 0001      Type   Cancer Type Left breast cancer      Lymphedema Assessments   Lymphedema Assessments Upper  extremities      Right Upper Extremity Lymphedema   10 cm Proximal to Olecranon Process 24 cm    Olecranon Process 23.1 cm    10 cm Proximal to Ulnar Styloid Process 19.4 cm    Just Proximal to Ulnar Styloid Process 14.9 cm    Across Hand at [employer] 19.7 cm    At [place] of 2nd Digit 6.5 cm      Left Upper Extremity Lymphedema   10 cm Proximal to Olecranon Process 24.4 cm    Olecranon Process 23.2 cm    10 cm Proximal to Ulnar Styloid Process 18.4 cm    Just Proximal to Ulnar Styloid Process 14.8 cm    Across Hand at [employer] 18.4 cm    At [place] of 2nd Digit 5.8 cm           L-DEX FLOWSHEETS - 08/29/20 2200      L-DEX LYMPHEDEMA SCREENING   Measurement Type Unilateral    L-DEX MEASUREMENT EXTREMITY Upper Extremity    POSITION  Standing    DOMINANT SIDE Right    At Risk Side Left    BASELINE SCORE (UNILATERAL) 1.3          The patient was assessed using the L-Dex machine today to produce a lymphedema index baseline score. The patient will be reassessed on a regular basis (typically every 3 months) to obtain new L-Dex scores. If the score is > 6.5 points away from his/her baseline score indicating onset of subclinical lymphedema, it will be recommended to wear a compression garment for 4 weeks, 12 hours per day and then be reassessed. If the score continues to be > 6.5 points from baseline at reassessment, we will initiate lymphedema treatment. Assessing in this manner has a 95% rate of preventing clinically significant lymphedema.       Katina Dung - 08/29/20 0001    Open a tight or new jar Moderate difficulty    Do heavy household chores (wash walls, wash floors) Mild difficulty    Carry a shopping bag or briefcase No difficulty    Wash your back Mild difficulty    Use a knife to cut food No difficulty    Recreational activities in which you take some force or impact through your arm, shoulder, or hand (golf, hammering, tennis) No difficulty    During the past  week, to what extent has  your arm, shoulder or hand problem interfered with your normal social activities with family, friends, neighbors, or groups? Not at all    During the past week, to what extent has your arm, shoulder or hand problem limited your work or other regular daily activities Not at all    Arm, shoulder, or hand pain. Mild    Tingling (pins and needles) in your arm, shoulder, or hand None    Difficulty Sleeping Mild difficulty    DASH Score 13.64 %            Objective measurements completed on examination: See above findings.        Patient was instructed today in a home exercise program today for post op shoulder range of motion. These included active assist shoulder flexion in sitting, scapular retraction, wall walking with shoulder abduction, and hands behind head external rotation.  She was encouraged to do these twice a day, holding 3 seconds and repeating 5 times when permitted by her physician.           PT Education - 08/29/20 2213    Education Details Lymphedema risk reduction and post op shoulder ROM HEP    Person(s) Educated Patient;Child(ren)    Methods Explanation;Demonstration;Verbal cues    Comprehension Returned demonstration;Verbalized understanding               PT [place] Term Goals - 08/29/20 2216      PT [place] TERM GOAL #1   Title Patient will demonstrate she has regained full shoulder ROM and function post operatively compared to baselines.    Time 8    Period Weeks    Status New    Target Date 10/24/20           Breast Clinic Goals - 08/29/20 2216      Patient will be able to verbalize understanding of pertinent lymphedema risk reduction practices relevant to her diagnosis specifically related to skin care.   Time 1    Period Days    Status Achieved      Patient will be able to return demonstrate and/or verbalize understanding of the post-op home exercise program related to regaining shoulder range of motion.   Time 1     Period Days    Status Achieved      Patient will be able to verbalize understanding of the importance of attending the postoperative After Breast Cancer Class for further lymphedema risk reduction education and therapeutic exercise.   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 It measures 1.2 cm of asymmetry and is located in the upper outer quadrant. It is ER/PR positive and HER2 negative with a Ki67 of 10%. Her multidisciplinary medical team met prior to her assessments to determine a recommended treatment plan. She is planning to have a left lumpectomy and sentinel node biopsy followed by Oncotype testing, radiation, and anti-estrogen therapy. She will benefit from a post op PT reassessment to determine needs and from L-Dex screens every 3 months for 2 years to detect subclinical lymphedema.    Stability/Clinical Decision Making Stable/Uncomplicated    Clinical Decision Making Low    Rehab Potential Excellent    PT Frequency --   Eval and 1 f/u visit   PT  Treatment/Interventions ADLs/Self Care Home Management;Therapeutic exercise;Patient/family education    PT Next Visit Plan Will reasess 3-4 weeks post op to determine needs    PT Home Exercise Plan Post op shoulder ROM HEP    Consulted and Agree with Plan of Care Patient;Family member/caregiver    Family Member Consulted daughter           Patient will benefit from skilled therapeutic intervention in order to improve the following deficits and impairments:  Postural dysfunction, Decreased range of motion, Decreased knowledge of precautions, Impaired UE functional use, Pain  Visit Diagnosis: Malignant neoplasm of upper-outer quadrant of left breast in female, estrogen receptor positive ([place]) - Plan: PT plan of care cert/re-cert  Abnormal posture - Plan: PT plan of  care cert/re-cert   Patient will follow up at outpatient cancer rehab 3-4 weeks following surgery.  If the patient requires physical therapy at that time, a specific plan will be dictated and sent to the referring physician for approval. The patient was educated today on appropriate basic range of motion exercises to begin post operatively and the importance of attending the After Breast Cancer class following surgery.  Patient was educated today on lymphedema risk reduction practices as it pertains to recommendations that will benefit the patient immediately following surgery.  She verbalized good understanding.      Problem List Patient Active Problem List   Diagnosis Date Noted  . Malignant neoplasm of upper-outer quadrant of left breast in female, estrogen receptor positive ([place]) 08/24/2020  . Estrogen deficiency 05/24/2019  . Vitamin D deficiency 08/03/2015  . Routine general medical examination at a health care facility 05/15/2015  . Dyspepsia 08/23/2014  . Abnormal ankle brachial index 08/23/2014  . Rheumatoid arthritis ([place]) 06/19/2014  . Thrombocytopenia ([place]) 06/19/2014  . Encounter for Medicare annual wellness exam 11/11/2013  . Hyperbilirubinemia 08/19/2013  . Hypothyroid 09/21/2012  . NEOPLASMS UNSPEC NATURE BONE SOFT TISSUE&SKIN 10/23/2010  . 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 OTHER SYMPTOMS REFERABLE JOINT SITE UNSPECIFIED 10/23/2010  . CARPAL TUNNEL SYNDROME, HX OF 04/05/2009  . Leukocytopenia 05/02/2008  . DRY MOUTH 04/28/2008  . INSOMNIA, TRANSIENT 04/27/2007  . Osteopenia 04/27/2007  . Hyperlipidemia 04/23/2007  . HIATAL HERNIA 04/23/2007  . POSTMENOPAUSAL STATUS 04/23/2007  . RHEUMATOID FACTOR, POSITIVE 04/23/2007   Annia Friendly, PT 08/29/20 10:18 PM  [place], [place], [address] Phone: [phone]   Fax:  [phone]  Name: Jennifer Sellers MRN: [id number] Date of Birth:  May 03, 1946

## 2020-08-30 NOTE — Progress Notes (Signed)
Met with Jennifer Sellers and her daughter at the Breast [facility] to introduce [place] team/resources and review the distress screen per protocol.  The patient scored a 9 on the Psychosocial Distress thermometer which indicates Severe Distress. Sahory was restless and concerned about her daughter being able to leave in time to pick up her child from school.  She took notes about all of the support resources available to her and stated that she wished she had more time to talk, but indicated that she was worried they would be late.  I assured her that I could follow up by phone to provide more information and check in.  She would like to continue to think about whether a peer mentor would be helpful.    Please contact as further needs arise.  Donald Prose. Elyn Peers, M.Div. [facility] Chaplain  Office [phone]   Pager [phone]

## 2020-09-03 ENCOUNTER — Ambulatory Visit
Admission: RE | Admit: 2020-09-03 | Discharge: 2020-09-03 | Disposition: A | Discharge status: Home / Self Care | Payer: Medicare Other | Source: Ambulatory Visit | Attending: General Surgery | Admitting: General Surgery

## 2020-09-03 ENCOUNTER — Other Ambulatory Visit: Payer: Self-pay

## 2020-09-03 DIAGNOSIS — Z170 Estrogen receptor positive status [ER+]: Secondary | ICD-10-CM

## 2020-09-03 MED ORDER — GADOBUTROL 1 MMOL/ML IV SOLN
6.0000 mL | Freq: Once | INTRAVENOUS | Status: AC | PRN
  Administered 2020-09-03: 6 mL via INTRAVENOUS

## 2020-09-04 ENCOUNTER — Other Ambulatory Visit: Payer: Self-pay

## 2020-09-05 ENCOUNTER — Ambulatory Visit (HOSPITAL_COMMUNITY): Payer: Medicare Other

## 2020-09-06 ENCOUNTER — Encounter: Payer: Self-pay | Admitting: *Deleted

## 2020-09-06 ENCOUNTER — Telehealth: Payer: Self-pay | Admitting: *Deleted

## 2020-09-06 NOTE — Telephone Encounter (Signed)
Spoke with patient to follow up from [facility] last week. Denies any concerns or questions at this time. Encouraged her to call should anything arise.

## 2020-09-17 ENCOUNTER — Telehealth: Payer: Self-pay | Admitting: Licensed Clinical Social Worker

## 2020-09-17 NOTE — Telephone Encounter (Signed)
Received call from patient asking for information about peer mentor program. CSW explained [employer] and pt requested referral. Referral made. CSW also shared contact information for [place], pt's primary CSWs.   Edwinna Areola Larkin Alfred, LCSW

## 2020-09-20 ENCOUNTER — Telehealth: Payer: Self-pay | Admitting: *Deleted

## 2020-09-20 NOTE — Telephone Encounter (Signed)
Received a message that patient had some questions.  Called and spoke with patient to answer her questions regarding her surgery and post op.  Encouraged her to call if she needs anything else. Patient verbalized understandng.

## 2020-09-26 NOTE — Progress Notes (Signed)
Nutrition  Patient identified by attending Breast Clinic on 08/29/2020.  Patient was given nutrition packet with RD contact information by nurse navigator.   Chart reviewed.   74 year old female with new diagnosis of breast cancer.  Planning lumpectomy, oncotype, possible radiation and aromatase inhibitors.    Ht: 66.5 inches Wt: 135 lb BMI: 21  Stable weight.  Patient currently not at nutritional risk.  Please consult RD if changes occur in nutritional status.  Emanuela Runnion B. Zenia Resides, [place], [place] Registered Dietitian [phone] (mobile)

## 2020-09-28 ENCOUNTER — Encounter (HOSPITAL_BASED_OUTPATIENT_CLINIC_OR_DEPARTMENT_OTHER): Payer: Self-pay | Admitting: General Surgery

## 2020-09-28 ENCOUNTER — Other Ambulatory Visit: Payer: Self-pay

## 2020-10-02 ENCOUNTER — Other Ambulatory Visit (HOSPITAL_COMMUNITY)
Admission: RE | Admit: 2020-10-02 | Discharge: 2020-10-02 | Disposition: A | Discharge status: Home / Self Care | Payer: Medicare Other | Source: Ambulatory Visit | Attending: General Surgery | Admitting: General Surgery

## 2020-10-02 DIAGNOSIS — Z01812 Encounter for preprocedural laboratory examination: Secondary | ICD-10-CM | POA: Insufficient documentation

## 2020-10-02 DIAGNOSIS — Z20822 Contact with and (suspected) exposure to covid-19: Secondary | ICD-10-CM | POA: Diagnosis not present

## 2020-10-02 LAB — SARS CORONAVIRUS 2 (TAT 6-24 HRS): SARS Coronavirus 2: NEGATIVE

## 2020-10-03 ENCOUNTER — Encounter: Payer: Self-pay | Admitting: Family Medicine

## 2020-10-03 MED ORDER — CHLORHEXIDINE GLUCONATE CLOTH 2 % EX PADS: 6.0000 | MEDICATED_PAD | Freq: Once | CUTANEOUS | Status: DC

## 2020-10-03 NOTE — Progress Notes (Signed)
      Enhanced Recovery after Surgery for Orthopedics Enhanced Recovery after Surgery is a protocol used to improve the stress on your body and your recovery after surgery.  Patient Instructions  . The night before surgery:  o No food after midnight. ONLY clear liquids after midnight  . The day of surgery (if you do NOT have diabetes):  o Drink ONE (1) Pre-Surgery Clear Ensure as directed.   o This drink was given to you during your hospital  pre-op appointment visit. o The pre-op nurse will instruct you on the time to drink the  Pre-Surgery Ensure depending on your surgery time. o Finish the drink at the designated time by the pre-op nurse.  o Nothing else to drink after completing the  Pre-Surgery Clear Ensure.  . The day of surgery (if you have diabetes): o Drink ONE (1) Gatorade 2 (G2) as directed. o This drink was given to you during your hospital  pre-op appointment visit.  o The pre-op nurse will instruct you on the time to drink the   Gatorade 2 (G2) depending on your surgery time. o Color of the Gatorade may vary. Red is not allowed. o Nothing else to drink after completing the  Gatorade 2 (G2).         If you have questions, please contact your surgeon's office.  Surgical soap given to the pt with instructions, pt verbalized understandings.

## 2020-10-05 ENCOUNTER — Encounter (HOSPITAL_COMMUNITY)
Admission: RE | Admit: 2020-10-05 | Discharge: 2020-10-05 | Disposition: A | Discharge status: Home / Self Care | Payer: Medicare Other | Source: Ambulatory Visit | Attending: General Surgery | Admitting: General Surgery

## 2020-10-05 ENCOUNTER — Encounter (HOSPITAL_BASED_OUTPATIENT_CLINIC_OR_DEPARTMENT_OTHER): Payer: Self-pay | Admitting: General Surgery

## 2020-10-05 ENCOUNTER — Encounter (HOSPITAL_BASED_OUTPATIENT_CLINIC_OR_DEPARTMENT_OTHER)
Admission: RE | Disposition: A | Discharge status: Home / Self Care | Payer: Self-pay | Source: Home / Self Care | Attending: General Surgery

## 2020-10-05 ENCOUNTER — Other Ambulatory Visit: Payer: Self-pay

## 2020-10-05 ENCOUNTER — Ambulatory Visit (HOSPITAL_BASED_OUTPATIENT_CLINIC_OR_DEPARTMENT_OTHER): Payer: Medicare Other | Admitting: Certified Registered"

## 2020-10-05 ENCOUNTER — Ambulatory Visit (HOSPITAL_BASED_OUTPATIENT_CLINIC_OR_DEPARTMENT_OTHER)
Admission: RE | Admit: 2020-10-05 | Discharge: 2020-10-05 | Disposition: A | Discharge status: Home / Self Care | Payer: Medicare Other | Attending: General Surgery | Admitting: General Surgery

## 2020-10-05 DIAGNOSIS — Z807 Family history of other malignant neoplasms of lymphoid, hematopoietic and related tissues: Secondary | ICD-10-CM | POA: Insufficient documentation

## 2020-10-05 DIAGNOSIS — Z170 Estrogen receptor positive status [ER+]: Secondary | ICD-10-CM | POA: Insufficient documentation

## 2020-10-05 DIAGNOSIS — C50412 Malignant neoplasm of upper-outer quadrant of left female breast: Secondary | ICD-10-CM | POA: Insufficient documentation

## 2020-10-05 DIAGNOSIS — Z803 Family history of malignant neoplasm of breast: Secondary | ICD-10-CM | POA: Insufficient documentation

## 2020-10-05 HISTORY — PX: BREAST LUMPECTOMY WITH RADIOACTIVE SEED AND SENTINEL LYMPH NODE BIOPSY: SHX6550

## 2020-10-05 SURGERY — BREAST LUMPECTOMY WITH RADIOACTIVE SEED AND SENTINEL LYMPH NODE BIOPSY
Anesthesia: General | Site: Breast | Laterality: Left

## 2020-10-05 MED ORDER — GABAPENTIN 300 MG PO CAPS: 300.0000 mg | ORAL_CAPSULE | ORAL | Status: DC

## 2020-10-05 MED ORDER — BUPIVACAINE-EPINEPHRINE (PF) 0.5% -1:200000 IJ SOLN
INTRAMUSCULAR | Status: DC | PRN
  Administered 2020-10-05: 20 mL via PERINEURAL

## 2020-10-05 MED ORDER — FENTANYL CITRATE (PF) 100 MCG/2ML IJ SOLN: 25.0000 ug | INTRAMUSCULAR | Status: DC | PRN

## 2020-10-05 MED ORDER — PROPOFOL 10 MG/ML IV BOLUS
INTRAVENOUS | Status: DC | PRN
  Administered 2020-10-05: 100 mg via INTRAVENOUS

## 2020-10-05 MED ORDER — FENTANYL CITRATE (PF) 100 MCG/2ML IJ SOLN
100.0000 ug | Freq: Once | INTRAMUSCULAR | Status: AC
  Administered 2020-10-05: 50 ug via INTRAVENOUS

## 2020-10-05 MED ORDER — OXYCODONE HCL 5 MG/5ML PO SOLN: 5.0000 mg | Freq: Once | ORAL | Status: DC | PRN

## 2020-10-05 MED ORDER — BUPIVACAINE-EPINEPHRINE 0.25% -1:200000 IJ SOLN
INTRAMUSCULAR | Status: DC | PRN
  Administered 2020-10-05: 20 mL

## 2020-10-05 MED ORDER — CEFAZOLIN SODIUM-DEXTROSE 2-4 GM/100ML-% IV SOLN
INTRAVENOUS | Status: AC
  Filled 2020-10-05: qty 100

## 2020-10-05 MED ORDER — ONDANSETRON HCL 4 MG/2ML IJ SOLN: 4.0000 mg | Freq: Four times a day (QID) | INTRAMUSCULAR | Status: DC | PRN

## 2020-10-05 MED ORDER — ONDANSETRON HCL 4 MG/2ML IJ SOLN
INTRAMUSCULAR | Status: DC | PRN
  Administered 2020-10-05: 4 mg via INTRAVENOUS

## 2020-10-05 MED ORDER — MIDAZOLAM HCL 2 MG/2ML IJ SOLN
INTRAMUSCULAR | Status: AC
  Filled 2020-10-05: qty 2

## 2020-10-05 MED ORDER — LIDOCAINE 2% (20 MG/ML) 5 ML SYRINGE
INTRAMUSCULAR | Status: DC | PRN
  Administered 2020-10-05: 30 mg via INTRAVENOUS

## 2020-10-05 MED ORDER — DEXAMETHASONE SODIUM PHOSPHATE 4 MG/ML IJ SOLN
INTRAMUSCULAR | Status: DC | PRN
  Administered 2020-10-05: 4 mg via INTRAVENOUS

## 2020-10-05 MED ORDER — OXYCODONE HCL 5 MG PO TABS: 5.0000 mg | ORAL_TABLET | Freq: Once | ORAL | Status: DC | PRN

## 2020-10-05 MED ORDER — HYDROCODONE-ACETAMINOPHEN 5-325 MG PO TABS: 1.0000 | ORAL_TABLET | Freq: Four times a day (QID) | ORAL | 0 refills | Status: DC | PRN

## 2020-10-05 MED ORDER — PROPOFOL 500 MG/50ML IV EMUL
INTRAVENOUS | Status: DC | PRN
  Administered 2020-10-05: 25 ug/kg/min via INTRAVENOUS

## 2020-10-05 MED ORDER — FENTANYL CITRATE (PF) 100 MCG/2ML IJ SOLN
INTRAMUSCULAR | Status: AC
  Filled 2020-10-05: qty 2

## 2020-10-05 MED ORDER — FENTANYL CITRATE (PF) 100 MCG/2ML IJ SOLN
INTRAMUSCULAR | Status: DC | PRN
  Administered 2020-10-05: 25 ug via INTRAVENOUS

## 2020-10-05 MED ORDER — ACETAMINOPHEN 500 MG PO TABS
1000.0000 mg | ORAL_TABLET | ORAL | Status: AC
  Administered 2020-10-05: 1000 mg via ORAL

## 2020-10-05 MED ORDER — LACTATED RINGERS IV SOLN: INTRAVENOUS | Status: DC

## 2020-10-05 MED ORDER — CEFAZOLIN SODIUM-DEXTROSE 2-4 GM/100ML-% IV SOLN
2.0000 g | INTRAVENOUS | Status: AC
  Administered 2020-10-05: 2 g via INTRAVENOUS

## 2020-10-05 MED ORDER — GABAPENTIN 300 MG PO CAPS
ORAL_CAPSULE | ORAL | Status: AC
  Filled 2020-10-05: qty 1

## 2020-10-05 MED ORDER — ACETAMINOPHEN 500 MG PO TABS
ORAL_TABLET | ORAL | Status: AC
  Filled 2020-10-05: qty 2

## 2020-10-05 MED ORDER — TECHNETIUM TC 99M TILMANOCEPT KIT
1.0000 | PACK | Freq: Once | INTRAVENOUS | Status: AC | PRN
  Administered 2020-10-05: 1 via INTRADERMAL

## 2020-10-05 MED ORDER — EPHEDRINE SULFATE 50 MG/ML IJ SOLN
INTRAMUSCULAR | Status: DC | PRN
  Administered 2020-10-05: 10 mg via INTRAVENOUS

## 2020-10-05 SURGICAL SUPPLY — 46 items
ADH SKN CLS APL DERMABOND .7 (GAUZE/BANDAGES/DRESSINGS) ×1
APL PRP STRL LF DISP 70% ISPRP (MISCELLANEOUS) ×1
APPLIER CLIP 9.375 MED OPEN (MISCELLANEOUS) ×3
APR CLP MED 9.3 20 MLT OPN (MISCELLANEOUS) ×1
BLADE SURG 15 STRL LF DISP TIS (BLADE) ×1 IMPLANT
BLADE SURG 15 STRL SS (BLADE) ×3
CANISTER SUC SOCK COL 7IN (MISCELLANEOUS) IMPLANT
CANISTER SUCT 1200ML W/VALVE (MISCELLANEOUS) IMPLANT
CHLORAPREP W/TINT 26 (MISCELLANEOUS) ×3 IMPLANT
CLIP APPLIE 9.375 MED OPEN (MISCELLANEOUS) ×1 IMPLANT
COVER BACK TABLE 60X90IN (DRAPES) ×3 IMPLANT
COVER MAYO STAND STRL (DRAPES) ×3 IMPLANT
COVER PROBE W GEL 5X96 (DRAPES) ×3 IMPLANT
COVER WAND RF STERILE (DRAPES) IMPLANT
DECANTER SPIKE VIAL GLASS SM (MISCELLANEOUS) IMPLANT
DERMABOND ADVANCED (GAUZE/BANDAGES/DRESSINGS) ×2
DERMABOND ADVANCED .7 DNX12 (GAUZE/BANDAGES/DRESSINGS) ×1 IMPLANT
DRAPE LAPAROSCOPIC ABDOMINAL (DRAPES) ×3 IMPLANT
DRAPE UTILITY XL STRL (DRAPES) ×3 IMPLANT
ELECT COATED BLADE 2.86 ST (ELECTRODE) ×3 IMPLANT
ELECT REM PT RETURN 9FT ADLT (ELECTROSURGICAL) ×3
ELECTRODE REM PT RTRN 9FT ADLT (ELECTROSURGICAL) ×1 IMPLANT
GLOVE BIO SURGEON STRL SZ7.5 (GLOVE) ×7 IMPLANT
GOWN STRL REUS W/ TWL LRG LVL3 (GOWN DISPOSABLE) ×2 IMPLANT
GOWN STRL REUS W/TWL LRG LVL3 (GOWN DISPOSABLE) ×6
ILLUMINATOR WAVEGUIDE N/F (MISCELLANEOUS) IMPLANT
KIT MARKER MARGIN INK (KITS) ×3 IMPLANT
LIGHT WAVEGUIDE WIDE FLAT (MISCELLANEOUS) IMPLANT
NDL HYPO 25X1 1.5 SAFETY (NEEDLE) ×1 IMPLANT
NDL SAFETY ECLIPSE 18X1.5 (NEEDLE) IMPLANT
NEEDLE HYPO 18GX1.5 SHARP (NEEDLE)
NEEDLE HYPO 25X1 1.5 SAFETY (NEEDLE) ×3 IMPLANT
NS IRRIG 1000ML POUR BTL (IV SOLUTION) IMPLANT
PACK BASIN DAY SURGERY FS (CUSTOM PROCEDURE TRAY) ×3 IMPLANT
PENCIL SMOKE EVACUATOR (MISCELLANEOUS) ×3 IMPLANT
SLEEVE SCD COMPRESS KNEE MED (MISCELLANEOUS) ×3 IMPLANT
SPONGE LAP 18X18 RF (DISPOSABLE) ×3 IMPLANT
SUT MON AB 4-0 PC3 18 (SUTURE) ×8 IMPLANT
SUT SILK 2 0 SH (SUTURE) IMPLANT
SUT VICRYL 3-0 CR8 SH (SUTURE) ×3 IMPLANT
SYR CONTROL 10ML LL (SYRINGE) ×3 IMPLANT
TOWEL GREEN STERILE FF (TOWEL DISPOSABLE) ×3 IMPLANT
TRAY FAXITRON CT DISP (TRAY / TRAY PROCEDURE) ×3 IMPLANT
TUBE CONNECTING 20'X1/4 (TUBING)
TUBE CONNECTING 20X1/4 (TUBING) IMPLANT
YANKAUER SUCT BULB TIP NO VENT (SUCTIONS) IMPLANT

## 2020-10-05 NOTE — Anesthesia Postprocedure Evaluation (Signed)
Anesthesia Post Note  Patient: Jennifer Sellers  Procedure(s) Performed: LEFT BREAST LUMPECTOMY WITH RADIOACTIVE SEED AND SENTINEL LYMPH NODE BIOPSY (Left Breast)     Patient location during evaluation: PACU Anesthesia Type: General and Regional Level of consciousness: awake and alert Pain management: pain level controlled Vital Signs Assessment: post-procedure vital signs reviewed and stable Respiratory status: spontaneous breathing, nonlabored ventilation, respiratory function stable and patient connected to nasal cannula oxygen Cardiovascular status: blood pressure returned to baseline and stable Postop Assessment: no apparent nausea or vomiting Anesthetic complications: no   No complications documented.  Last Vitals:  Vitals:   10/05/20 1030 10/05/20 1046  BP: 118/73 120/72  Pulse: 82 74  Resp: 18 18  Temp:  36.8 C  SpO2: 97% 100%    Last Pain:  Vitals:   10/05/20 1046  TempSrc: Oral  PainSc: 0-No pain                 Madelaine Whipple S

## 2020-10-05 NOTE — H&P (Signed)
Jennifer Sellers  Location: [facility] Patient #: [id number] DOB: 1946/09/21 Undefined / Language: [place] / Race: White Female   History of Present Illness  The patient is a 74 year old female who presents with breast cancer.We are asked to see the patient in consultation by Dr. Mosetta Putt to evaluate her for a new left breast cancer. The patient is a 74 year old white female who presents with a 1.2cm assymetry in the UOQ of the left breast. The axilla looked neg. This was biopsied and came back as a Grade 1 ILC that was ER and PR + and Her2 - with a Ki67 of 10%. She does have a history of lymphoma and breast cancer in a couple of relatives. She does not smoke.   Past Surgical History Breast Biopsy  Left. Colon Polyp Removal - Colonoscopy  Hysterectomy (not due to cancer) - Complete  Oral Surgery   Diagnostic Studies History  Colonoscopy  1-5 years ago Mammogram  within last year Pap Smear  >5 years ago  Medication History Medications Reconciled  Social History Alcohol use  Occasional alcohol use. Caffeine use  Coffee, Tea. No drug use  Tobacco use  Never smoker.  Family History  Arthritis  Mother. Breast Cancer  Family Members In General. Cancer  Daughter, Mother. Cerebrovascular Accident  Father. Heart Disease  Father. Hypertension  Father.  Pregnancy / Birth History Age at menarche  11 years. Gravida  3 Maternal age  110-25 Para  2 Regular periods   Other Problems  Arthritis  Gastroesophageal Reflux Disease  Heart murmur  Hemorrhoids     Review of Systems General Present- Fatigue. Not Present- Appetite Loss, Chills, Fever, Night Sweats, Weight Gain and Weight Loss. Skin Present- Dryness. Not Present- Change in Wart/Mole, Hives, Jaundice, New Lesions, Non-Healing Wounds, Rash and Ulcer. HEENT Not Present- Earache, Hearing Loss, Hoarseness, Nose Bleed, Oral Ulcers, Ringing in the Ears, Seasonal Allergies, Sinus Pain, Sore Throat,  Visual Disturbances, Wears glasses/contact lenses and Yellow Eyes. Respiratory Not Present- Bloody sputum, Chronic Cough, Difficulty Breathing, Snoring and Wheezing. Breast Not Present- Breast Mass, Breast Pain, Nipple Discharge and Skin Changes. Cardiovascular Not Present- Chest Pain, Difficulty Breathing Lying Down, Leg Cramps, Palpitations, Rapid Heart Rate, Shortness of Breath and Swelling of Extremities. Gastrointestinal Present- Hemorrhoids. Not Present- Abdominal Pain, Bloating, Bloody Stool, Change in Bowel Habits, Chronic diarrhea, Constipation, Difficulty Swallowing, Excessive gas, Gets full quickly at meals, Indigestion, Nausea, Rectal Pain and Vomiting. Female Genitourinary Not Present- Frequency, Nocturia, Painful Urination, Pelvic Pain and Urgency. Neurological Present- Decreased Memory. Not Present- Fainting, Headaches, Numbness, Seizures, Tingling, Tremor, Trouble walking and Weakness. Psychiatric Present- Anxiety and Change in Sleep Pattern. Not Present- Bipolar, Depression, Fearful and Frequent crying. Endocrine Not Present- Cold Intolerance, Excessive Hunger, Hair Changes, Heat Intolerance, Hot flashes and New Diabetes. Hematology Not Present- Blood Thinners, Easy Bruising, Excessive bleeding, Gland problems, HIV and Persistent Infections.   Physical Exam  General Mental Status-Alert. General Appearance-Consistent with stated age. Hydration-Well hydrated. Voice-Normal.  Head and Neck Head-normocephalic, atraumatic with no lesions or palpable masses. Trachea-midline. Thyroid Gland Characteristics - normal size and consistency.  Eye Eyeball - Bilateral-Extraocular movements intact. Sclera/Conjunctiva - Bilateral-No scleral icterus.  Chest and Lung Exam Chest and lung exam reveals -quiet, even and easy respiratory effort with no use of accessory muscles and on auscultation, normal breath sounds, no adventitious sounds and normal vocal  resonance. Inspection Chest Wall - Normal. Back - normal.  Breast Note: there is no palpable mass in either breast. There  is a palpable mobile lymph node in each axilla.   Cardiovascular Cardiovascular examination reveals -normal heart sounds, regular rate and rhythm with no murmurs and normal pedal pulses bilaterally.  Abdomen Inspection Inspection of the abdomen reveals - No Hernias. Skin - Scar - no surgical scars. Palpation/Percussion Palpation and Percussion of the abdomen reveal - Soft, Non Tender, No Rebound tenderness, No Rigidity (guarding) and No hepatosplenomegaly. Auscultation Auscultation of the abdomen reveals - Bowel sounds normal.  Neurologic Neurologic evaluation reveals -alert and oriented x 3 with no impairment of recent or remote memory. Mental Status-Normal.  Musculoskeletal Normal Exam - Left-Upper Extremity Strength Normal and Lower Extremity Strength Normal. Normal Exam - Right-Upper Extremity Strength Normal and Lower Extremity Strength Normal.  Lymphatic Head & Neck  General Head & Neck Lymphatics: Bilateral - Description - Normal. Axillary  General Axillary Region: Bilateral - Description - Normal. Tenderness - Non Tender. Femoral & Inguinal  Generalized Femoral & Inguinal Lymphatics: Bilateral - Description - Normal. Tenderness - Non Tender.    Assessment & Plan  MALIGNANT NEOPLASM OF UPPER-OUTER QUADRANT OF LEFT BREAST IN FEMALE, ESTROGEN RECEPTOR POSITIVE (C50.412) Impression: The patient appears to have a small stage 1 cancer in the UOQ of the left breast. I have discussed with her the different options for treatment and at this point she favors breast conservation which is very reasonable. she would be a good candidate for sentinel node biopsy. I have discussed with her in detail the risks and benefits of the surgery as well as some of the technical aspects including the use of a seed for localization and she understands and wishes  to proceed. Because of the lobular nature of the cancer we will evaluate her with MRI and see if this changes her plan at all. This patient encounter took 60 minutes today to perform the following: take history, perform exam, review outside records, interpret imaging, counsel the patient on their diagnosis and document encounter, findings & plan in the EHR Current Plans Referred to Oncology, for evaluation and follow up (Oncology). Routine.

## 2020-10-05 NOTE — Discharge Instructions (Signed)
  Post Anesthesia Home Care Instructions  Activity: Get plenty of rest for the remainder of the day. A responsible individual must stay with you for 24 hours following the procedure.  For the next 24 hours, DO NOT: -Drive a car -Paediatric nurse -Drink alcoholic beverages -Take any medication unless instructed by your physician -Make any legal decisions or sign important papers.  Meals: Start with liquid foods such as gelatin or soup. Progress to regular foods as tolerated. Avoid greasy, spicy, heavy foods. If nausea and/or vomiting occur, drink only clear liquids until the nausea and/or vomiting subsides. Call your physician if vomiting continues.  Special Instructions/Symptoms: Your throat may feel dry or sore from the anesthesia or the breathing tube placed in your throat during surgery. If this causes discomfort, gargle with warm salt water. The discomfort should disappear within 24 hours.  Regional Anesthesia Blocks  1. Numbness or the inability to move the "blocked" extremity may last from 3-48 hours after placement. The length of time depends on the medication injected and your individual response to the medication. If the numbness is not going away after 48 hours, call your surgeon.  2. The extremity that is blocked will need to be protected until the numbness is gone and the  Strength has returned. Because you cannot feel it, you will need to take extra care to avoid injury. Because it may be weak, you may have difficulty moving it or using it. You may not know what position it is in without looking at it while the block is in effect.  3. For blocks in the legs and feet, returning to weight bearing and walking needs to be done carefully. You will need to wait until the numbness is entirely gone and the strength has returned. You should be able to move your leg and foot normally before you try and bear weight or walk. You will need someone to be with you when you first try to ensure  you do not fall and possibly risk injury.  4. Bruising and tenderness at the needle site are common side effects and will resolve in a few days.  5. Persistent numbness or new problems with movement should be communicated to the surgeon or the [facility] [phone] [place] [phone]).   *May take Tylenol at 1:30pm this afternoon 10/05/20.

## 2020-10-05 NOTE — Anesthesia Preprocedure Evaluation (Signed)
Anesthesia Evaluation  Patient identified by MRN, date of birth, ID band Patient awake    Reviewed: Allergy & Precautions, H&P , NPO status , Patient's Chart, lab work & pertinent test results  Airway Mallampati: II   Neck ROM: full    Dental   Pulmonary neg pulmonary ROS,    breath sounds clear to auscultation       Cardiovascular negative cardio ROS   Rhythm:regular Rate:Normal     Neuro/Psych TIA Neuromuscular disease    GI/Hepatic   Endo/Other  Hypothyroidism   Renal/GU      Musculoskeletal  (+) Arthritis , Rheumatoid disorders,    Abdominal   Peds  Hematology   Anesthesia Other Findings   Reproductive/Obstetrics Breast CA                             Anesthesia Physical Anesthesia Plan  ASA: II  Anesthesia Plan: General   Post-op Pain Management:  Regional for Post-op pain   Induction: Intravenous  PONV Risk Score and Plan: 3 and Ondansetron, Dexamethasone, Midazolam and Treatment may vary due to age or medical condition  Airway Management Planned: LMA  Additional Equipment:   Intra-op Plan:   Post-operative Plan: Extubation in OR  Informed Consent: I have reviewed the patients History and Physical, chart, labs and discussed the procedure including the risks, benefits and alternatives for the proposed anesthesia with the patient or authorized representative who has indicated his/her understanding and acceptance.       Plan Discussed with: CRNA, Anesthesiologist and Surgeon  Anesthesia Plan Comments:         Anesthesia Quick Evaluation

## 2020-10-05 NOTE — Progress Notes (Signed)
Nuc med inj performed by nuc med staff. Pt tol well with no additional sedation. VSS. Emotional support provided

## 2020-10-05 NOTE — Op Note (Signed)
10/05/2020  9:49 AM  PATIENT:  Jennifer Sellers  74 y.o. female  PRE-OPERATIVE DIAGNOSIS:  LEFT BREAST CANCER  POST-OPERATIVE DIAGNOSIS:  LEFT BREAST CANCER  PROCEDURE:  Procedure(s) with comments: LEFT BREAST LUMPECTOMY WITH RADIOACTIVE SEED LOCALIZATION AND DEEP LEFT AXILLARY SENTINEL LYMPH NODE BIOPSY (Left) - GENERAL AND PECTORAL BLOCK  SURGEON:  Surgeon(s) and Role:    * Jovita Kussmaul, MD - Primary  PHYSICIAN ASSISTANT:   ASSISTANTS: none   ANESTHESIA:   local and general  EBL:  minimal   BLOOD ADMINISTERED:none  DRAINS: none   LOCAL MEDICATIONS USED:  MARCAINE     SPECIMEN:  Source of Specimen:  left breast tissue and sentinel node x 2  DISPOSITION OF SPECIMEN:  PATHOLOGY  COUNTS:  YES  TOURNIQUET:  * No tourniquets in log *  DICTATION: .Dragon Dictation   After informed consent was obtained the patient was brought to the operating room and placed in the supine position on the operating table.  After adequate induction of general anesthesia the patient's left chest, breast, and axillary area were prepped with ChloraPrep, allowed to dry, and draped in usual sterile manner.  An appropriate timeout was performed.  Previously an I-125 seed was placed in the upper outer quadrant of the left breast to mark an area of invasive breast cancer.  Also earlier in the day the patient underwent injection of 1 mCi of technetium sulfur colloid in the subareolar position on the left.  The neoprobe was set to technetium and an area of radioactivity was readily identified in the left axilla.  This area was infiltrated with quarter percent Marcaine.  A small transversely oriented incision was made overlying the area of radioactivity.  The incision was carried through the skin and subcutaneous tissue sharply with the electrocautery until the deep left axillary space was entered.  Blunt hemostat dissection was directed by the neoprobe.  I was able to identify 2 hot lymph nodes.  These were  excised sharply with the electrocautery and the surrounding small vessels and lymphatics were controlled with clips.  Ex vivo counts on these nodes ranged from 30 to 200.  No other hot or palpable nodes were identified in the left axilla.  Hemostasis was achieved using Bovie electrocautery.  The deep layer of the wound was then closed with interrupted 3-0 Vicryl stitches.  The skin was closed with a running 4-0 Monocryl subcuticular stitch.  Attention was then turned to the left breast.  The neoprobe was set to I-125 in the area of radioactivity was readily identified.  Because the MRI estimated the size to be quite a bit larger than the initial mammogram and ultrasound I elected to make an elliptical incision in the skin overlying the area of radioactivity with a 15 blade knife.  The incision was carried through the skin and subcutaneous tissue sharply with the electrocautery.  Dissection was then carried widely around the radioactive seed while checking the area of radioactivity frequently.  This dissection was carried all the way to the chest wall.  Once the specimen was removed it was oriented with the appropriate paint colors.  A specimen radiograph was obtained that showed the clip and seed to be near the center of the specimen.  The specimen was then sent to pathology for further evaluation.  Hemostasis was achieved using the Bovie electrocautery.  The cavity was marked with clips.  The wound was irrigated with saline and infiltrated with more quarter percent Marcaine.  The deep layer of  the wound was then closed with layers of interrupted 3-0 Vicryl stitches.  The skin was then closed with a running 4-0 Monocryl subcuticular stitch.  Dermabond dressings were applied.  The patient tolerated the procedure well.  At the end of the case all needle sponge and instrument counts were correct.  The patient was then awakened and taken recovery in stable condition.  PLAN OF CARE: Discharge to home after  PACU  PATIENT DISPOSITION:  PACU - hemodynamically stable.   Delay start of Pharmacological VTE agent (>24hrs) due to surgical blood loss or risk of bleeding: not applicable

## 2020-10-05 NOTE — Anesthesia Procedure Notes (Signed)
Anesthesia Regional Block: Pectoralis block   Pre-Anesthetic Checklist: ,, timeout performed, Correct Patient, Correct Site, Correct Laterality, Correct Procedure, Correct Position, site marked, Risks and benefits discussed,  Surgical consent,  Pre-op evaluation,  At surgeon's request and post-op pain management  Laterality: Left  Prep: chloraprep       Needles:  Injection technique: Single-shot  Needle Type: Echogenic Needle     Needle Length: 9cm  Needle Gauge: 21     Additional Needles:   Narrative:  Start time: 10/05/2020 8:04 AM End time: 10/05/2020 8:12 AM Injection made incrementally with aspirations every 5 mL.  Performed by: Personally  Anesthesiologist: Albertha Ghee, MD  Additional Notes: Pt tolerated the procedure well.

## 2020-10-05 NOTE — Progress Notes (Signed)
Assisted Dr. Marcie Bal with left, ultrasound guided, pectoralis block. Side rails up, monitors on throughout procedure. See vital signs in flow sheet. Tolerated Procedure well.

## 2020-10-05 NOTE — Interval H&P Note (Signed)
History and Physical Interval Note:  10/05/2020 8:01 AM  Jennifer Sellers  has presented today for surgery, with the diagnosis of LEFT BREAST CANCER.  The various methods of treatment have been discussed with the patient and family. After consideration of risks, benefits and other options for treatment, the patient has consented to  Procedure(s) with comments: LEFT BREAST LUMPECTOMY WITH RADIOACTIVE SEED AND SENTINEL LYMPH NODE BIOPSY (Left) - GENERAL AND PECTORAL BLOCK as a surgical intervention.  The patient's history has been reviewed, patient examined, no change in status, stable for surgery.  I have reviewed the patient's chart and labs.  Questions were answered to the patient's satisfaction.     Autumn Messing III

## 2020-10-05 NOTE — Anesthesia Procedure Notes (Signed)
Procedure Name: LMA Insertion Date/Time: 10/05/2020 8:31 AM Performed by: Signe Colt, CRNA Pre-anesthesia Checklist: Patient identified, Emergency Drugs available, Suction available and Patient being monitored Patient Re-evaluated:Patient Re-evaluated prior to induction Oxygen Delivery Method: Circle System Utilized Preoxygenation: Pre-oxygenation with 100% oxygen Induction Type: IV induction Ventilation: Mask ventilation without difficulty LMA: LMA inserted LMA Size: 4.0 Number of attempts: 1 Airway Equipment and Method: bite block Placement Confirmation: positive ETCO2 Tube secured with: Tape Dental Injury: Teeth and Oropharynx as per pre-operative assessment

## 2020-10-05 NOTE — Transfer of Care (Signed)
Immediate Anesthesia Transfer of Care Note  Patient: Jennifer Sellers  Procedure(s) Performed: LEFT BREAST LUMPECTOMY WITH RADIOACTIVE SEED AND SENTINEL LYMPH NODE BIOPSY (Left Breast)  Patient Location: PACU  Anesthesia Type:GA combined with regional for post-op pain  Level of Consciousness: drowsy and patient cooperative  Airway & Oxygen Therapy: Patient Spontanous Breathing and Patient connected to face mask oxygen  Post-op Assessment: Report given to RN and Post -op Vital signs reviewed and stable  Post vital signs: Reviewed and stable  Last Vitals:  Vitals Value Taken Time  BP 105/65 10/05/20 0953  Temp    Pulse 84 10/05/20 0954  Resp 15 10/05/20 0954  SpO2 100 % 10/05/20 0954  Vitals shown include unvalidated device data.  Last Pain:  Vitals:   10/05/20 0718  TempSrc: Oral  PainSc: 0-No pain         Complications: No complications documented.

## 2020-10-08 ENCOUNTER — Encounter (HOSPITAL_BASED_OUTPATIENT_CLINIC_OR_DEPARTMENT_OTHER): Payer: Self-pay | Admitting: General Surgery

## 2020-10-09 LAB — SURGICAL PATHOLOGY

## 2020-10-10 ENCOUNTER — Encounter: Payer: Self-pay | Admitting: *Deleted

## 2020-10-10 ENCOUNTER — Telehealth: Payer: Self-pay | Admitting: *Deleted

## 2020-10-10 NOTE — Telephone Encounter (Signed)
Received order for oncotype testing. Requisition faxed to pathology and [facility] °

## 2020-10-22 ENCOUNTER — Telehealth: Payer: Self-pay | Admitting: *Deleted

## 2020-10-22 ENCOUNTER — Encounter: Payer: Self-pay | Admitting: *Deleted

## 2020-10-22 DIAGNOSIS — C50412 Malignant neoplasm of upper-outer quadrant of left female breast: Secondary | ICD-10-CM

## 2020-10-22 NOTE — Telephone Encounter (Signed)
Received oncotype score of 12. Physician team notified. Called pt, left vm with results and notified chemo not recommended with next step being xrt. Contact information provided for questions or needs.

## 2020-10-23 ENCOUNTER — Telehealth: Payer: Self-pay | Admitting: *Deleted

## 2020-10-23 ENCOUNTER — Encounter: Payer: Self-pay | Admitting: *Deleted

## 2020-10-23 NOTE — Telephone Encounter (Signed)
Spoke to pt regarding oncotype score 12. Informed does not need chemo. Next step is xrt with Dr. Lisbeth Renshaw. Confirmed appt date and time

## 2020-10-24 ENCOUNTER — Inpatient Hospital Stay
Admission: RE | Admit: 2020-10-24 | Discharge: 2020-10-24 | Disposition: A | Discharge status: Home / Self Care | Payer: Self-pay | Source: Ambulatory Visit | Attending: Radiation Oncology | Admitting: Radiation Oncology

## 2020-10-24 ENCOUNTER — Other Ambulatory Visit: Payer: Self-pay | Admitting: Radiation Oncology

## 2020-10-24 ENCOUNTER — Ambulatory Visit
Admission: RE | Admit: 2020-10-24 | Discharge: 2020-10-24 | Disposition: A | Discharge status: Home / Self Care | Payer: Self-pay | Source: Ambulatory Visit | Attending: Radiation Oncology | Admitting: Radiation Oncology

## 2020-10-24 DIAGNOSIS — Z170 Estrogen receptor positive status [ER+]: Secondary | ICD-10-CM

## 2020-10-24 DIAGNOSIS — C50412 Malignant neoplasm of upper-outer quadrant of left female breast: Secondary | ICD-10-CM

## 2020-10-29 ENCOUNTER — Encounter: Payer: Self-pay | Admitting: Physical Therapy

## 2020-10-29 ENCOUNTER — Ambulatory Visit: Payer: Medicare Other | Attending: General Surgery | Admitting: Physical Therapy

## 2020-10-29 ENCOUNTER — Other Ambulatory Visit: Payer: Self-pay

## 2020-10-29 DIAGNOSIS — C50412 Malignant neoplasm of upper-outer quadrant of left female breast: Secondary | ICD-10-CM | POA: Insufficient documentation

## 2020-10-29 DIAGNOSIS — R293 Abnormal posture: Secondary | ICD-10-CM | POA: Diagnosis present

## 2020-10-29 DIAGNOSIS — M25612 Stiffness of left shoulder, not elsewhere classified: Secondary | ICD-10-CM | POA: Insufficient documentation

## 2020-10-29 DIAGNOSIS — R600 Localized edema: Secondary | ICD-10-CM | POA: Insufficient documentation

## 2020-10-29 DIAGNOSIS — Z483 Aftercare following surgery for neoplasm: Secondary | ICD-10-CM | POA: Insufficient documentation

## 2020-10-29 DIAGNOSIS — Z170 Estrogen receptor positive status [ER+]: Secondary | ICD-10-CM | POA: Insufficient documentation

## 2020-10-29 NOTE — Therapy (Signed)
[place], [place], [address] Phone: [phone]   Fax:  [phone]  Physical Therapy Treatment  Patient Details  Name: Jennifer Sellers MRN: [id number] Date of Birth: 07-03-1946 Referring Provider (PT): Dr. Autumn Messing   Encounter Date: 10/29/2020   PT End of Session - 10/29/20 1214    Visit Number 2    Number of Visits 10    Date for PT Re-Evaluation 11/26/20    PT Start Time 1102    PT Stop Time 1208    PT Time Calculation (min) 66 min    Activity Tolerance Patient tolerated treatment well    Behavior During Therapy [facility] for tasks assessed/performed           Past Medical History:  Diagnosis Date  . Arthritis    rheumatoid arthritis  . Hypothyroidism   . Leukopenia   . Thyroid disease   . TIA (transient ischemic attack) 2011    Past Surgical History:  Procedure Laterality Date  . ABDOMINAL HYSTERECTOMY    . BREAST LUMPECTOMY WITH RADIOACTIVE SEED AND SENTINEL LYMPH NODE BIOPSY Left 10/05/2020   Procedure: LEFT BREAST LUMPECTOMY WITH RADIOACTIVE SEED AND SENTINEL LYMPH NODE BIOPSY;  Surgeon: Jovita Kussmaul, MD;  Location: [facility];  Service: General;  Laterality: Left;  GENERAL AND PECTORAL BLOCK  . CARPAL TUNNEL RELEASE    . COLONOSCOPY    . COLONOSCOPY WITH PROPOFOL N/A 06/30/2018   Procedure: COLONOSCOPY WITH PROPOFOL;  Surgeon: Manya Silvas, MD;  [facility] ENDOSCOPY;  Service: Endoscopy;  Laterality: N/A;  . colpo-ureteroscopy    . lens surgery      There were no vitals filed for this visit.   Subjective Assessment - 10/29/20 1103    Subjective Patient underwent a left lumpectomy and sentinel node biopsy (3 negative nodes) on 10/05/2020. Her Oncotype score was low (12) so no chemo is needed. She is scheduled for 10/31/2020 for radiation consultation and she will undergo anti-estrogen.    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 Patient underwent a left lumpectomy and sentinel node biopsy (3 negative nodes) on 10/05/2020. It is ER/PR positive and HER2 negative with a Ki67 of 10%.    Patient Stated Goals See if my arm is doing ok    Currently in Pain? Yes    Pain Score 6     Pain Location Breast    Pain Orientation Left;Lateral    Pain Descriptors / Indicators Throbbing    Pain Onset 1 to 4 weeks ago    Pain Frequency Intermittent    Aggravating Factors  Unknown    Pain Relieving Factors Unknown              [facility] PT Assessment - 10/29/20 0001      Assessment   Medical Diagnosis s/p left lumpectomy and SLNB    Referring Provider (PT) Dr. Autumn Messing    Onset Date/Surgical Date 10/05/20    Hand Dominance Right    Prior Therapy Baselines      Precautions   Precautions Other (comment)    Precaution Comments left arm lymphedema      Restrictions   Weight Bearing Restrictions No      Balance Screen   Has the patient fallen in the past 6 months No    Has the patient had a decrease in activity level because of a fear of falling?  No  Is the patient reluctant to leave their home because of a fear of falling?  No      Home Ecologist residence    Living Arrangements Spouse/significant other    Available Help at Discharge Family      Prior Function   Level of [place] Retired    Leisure She is not currently exercising      Cognition   Overall Cognitive Status Within Functional Limits for tasks assessed      Observation/Other Assessments   Observations Axillary cording present in left axilla; mild edema present left breast with hardness present. Incisions appear to be well healed with some glue present.      Posture/Postural Control   Posture/Postural Control Postural limitations    Postural Limitations Rounded Shoulders;Forward head;Increased thoracic kyphosis      ROM / Strength   AROM / PROM /  Strength AROM      AROM   AROM Assessment Site Shoulder    Right/Left Shoulder Left    Left Shoulder Extension 67 Degrees    Left Shoulder Flexion 120 Degrees    Left Shoulder ABduction 117 Degrees    Left Shoulder Internal Rotation 80 Degrees    Left Shoulder External Rotation 73 Degrees      Strength   Overall Strength Within functional limits for tasks performed             LYMPHEDEMA/ONCOLOGY QUESTIONNAIRE - 10/29/20 0001      Type   Cancer Type Left breast cancer      Surgeries   Lumpectomy Date 10/05/20    Sentinel Lymph Node Biopsy Date 10/05/20    Number Lymph Nodes Removed 3      Treatment   Active Chemotherapy Treatment No    Past Chemotherapy Treatment No    Active Radiation Treatment No    Past Radiation Treatment No    Current Hormone Treatment No    Past Hormone Therapy No      What other symptoms do you have   Are you Having Heaviness or Tightness No    Are you having Pain Yes    Are you having pitting edema No    Is it Hard or Difficult finding clothes that fit No    Do you have infections No    Is there Decreased scar mobility Yes    [place]emmer Sign No      Lymphedema Assessments   Lymphedema Assessments Upper extremities      Right Upper Extremity Lymphedema   10 cm Proximal to Olecranon Process 23.5 cm    Olecranon Process 22.6 cm    10 cm Proximal to Ulnar Styloid Process 18.9 cm    Just Proximal to Ulnar Styloid Process 14.8 cm    Across Hand at [employer] 19.7 cm    At [place] of 2nd Digit 6.5 cm      Left Upper Extremity Lymphedema   10 cm Proximal to Olecranon Process 24.2 cm    Olecranon Process 23.3 cm    10 cm Proximal to Ulnar Styloid Process 18.6 cm    Just Proximal to Ulnar Styloid Process 14.6 cm    Across Hand at [employer] 18.8 cm    At [place] of 2nd Digit 5.8 cm              Quick Dash - 10/29/20 0001    Open a tight or new jar Moderate difficulty  Do heavy household chores (wash walls, wash floors) Severe  difficulty    Carry a shopping bag or briefcase Mild difficulty    Wash your back Moderate difficulty    Use a knife to cut food Mild difficulty    Recreational activities in which you take some force or impact through your arm, shoulder, or hand (golf, hammering, tennis) Severe difficulty    During the past week, to what extent has your arm, shoulder or hand problem interfered with your normal social activities with family, friends, neighbors, or groups? Modererately    During the past week, to what extent has your arm, shoulder or hand problem limited your work or other regular daily activities Modererately    Arm, shoulder, or hand pain. Moderate    Tingling (pins and needles) in your arm, shoulder, or hand None    Difficulty Sleeping Mild difficulty    DASH Score 43.18 %                          PT Education - 10/29/20 1214    Education Details Scar massage, cording, aftercare, HEP    Person(s) Educated Patient;Child(ren)    Methods Explanation;Demonstration;Handout    Comprehension Returned demonstration;Verbalized understanding               PT Platter Term Goals - 10/29/20 1217      PT [place] TERM GOAL #1   Title Patient will demonstrate she has regained full shoulder ROM and function post operatively compared to baselines.    Time 4    Period Weeks    Status On-going    Target Date 11/26/20      PT [place] TERM GOAL #2   Title Patient will report she is able to lay in the required postion for radiation without c/o increaed pain.    Time 4    Period Weeks    Status New    Target Date 11/26/20      PT [place] TERM GOAL #3   Title Patient will improve her DASH score to be </= 13.64 for improved overall arm function and to achieve baseline score.    Time 4    Period Weeks    Status New    Target Date 11/26/20      PT [place] TERM GOAL #4   Title Patient will improve left shoulder active flexion and abduction to be >/= 140 degrees for increased ease reaching.     Baseline 117 abduction and 120 flexion post op    Time 4    Period Weeks    Status New    Target Date 11/26/20      PT Sethi TERM GOAL #5   Title Patient will verbalize good understanding of lymphedema risk reduction practices.    Time 4    Period Weeks    Target Date 11/26/20                 Plan - 10/29/20 1215    Clinical Impression Statement Patient is helaing well s/p left lumpectomy and sentinel node biopsy on 10/05/2020. She had 3 negative nodes removed and will undergo radiation (low Oncotype score - no chemo needed). She has limited shoulder ROM compared to baseline and some visible and palpable axillary cording present that is likely limiting her abduction ROM. She also has left breast edema (mostly lateral) which would benefit from some manual lymph drainage. She plans to purchase a compression bra today. She  will likely have difficulty obtaining radiation positioning with PT to improve ROM.    PT Frequency 2x / week    PT Duration 4 weeks    PT Treatment/Interventions ADLs/Self Care Home Management;Therapeutic exercise;Patient/family education;Manual techniques;Manual lymph drainage;Passive range of motion;Scar mobilization    PT Next Visit Plan PROM, myofascila releae in left axilla for cording; manual lymph drainage and scar massage left breast    PT Home Exercise Plan Post op shoulder ROM HEP and closed chain flexion and abduction    Consulted and Agree with Plan of Care Patient;Family member/caregiver    Family Member Consulted daughter           Patient will benefit from skilled therapeutic intervention in order to improve the following deficits and impairments:  Postural dysfunction,Decreased range of motion,Decreased knowledge of precautions,Impaired UE functional use,Pain,Increased edema,Decreased scar mobility  Visit Diagnosis: Malignant neoplasm of upper-outer quadrant of left breast in female, estrogen receptor positive ([place]) - Plan: PT plan of care  cert/re-cert  Abnormal posture - Plan: PT plan of care cert/re-cert  Aftercare following surgery for neoplasm - Plan: PT plan of care cert/re-cert  Stiffness of left shoulder, not elsewhere classified - Plan: PT plan of care cert/re-cert  Localized edema - Plan: PT plan of care cert/re-cert     Problem List Patient Active Problem List   Diagnosis Date Noted  . Malignant neoplasm of upper-outer quadrant of left breast in female, estrogen receptor positive ([place]) 08/24/2020  . Estrogen deficiency 05/24/2019  . Vitamin D deficiency 08/03/2015  . Routine general medical examination at a health care facility 05/15/2015  . Dyspepsia 08/23/2014  . Abnormal ankle brachial index 08/23/2014  . Rheumatoid arthritis ([place]) 06/19/2014  . Thrombocytopenia ([place]) 06/19/2014  . Encounter for Medicare annual wellness exam 11/11/2013  . Hyperbilirubinemia 08/19/2013  . Hypothyroid 09/21/2012  . NEOPLASMS UNSPEC NATURE BONE SOFT TISSUE&SKIN 10/23/2010  . 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 OTHER SYMPTOMS REFERABLE JOINT SITE UNSPECIFIED 10/23/2010  . CARPAL TUNNEL SYNDROME, HX OF 04/05/2009  . Leukocytopenia 05/02/2008  . DRY MOUTH 04/28/2008  . INSOMNIA, TRANSIENT 04/27/2007  . Osteopenia 04/27/2007  . Hyperlipidemia 04/23/2007  . HIATAL HERNIA 04/23/2007  . POSTMENOPAUSAL STATUS 04/23/2007  . RHEUMATOID FACTOR, POSITIVE 04/23/2007   Annia Friendly, PT 10/29/20 12:22 PM  [place], [place], [address] Phone: [phone]   Fax:  [phone]  Name: PASHA GADISON MRN: [id number] Date of Birth: November 29, 1945

## 2020-10-29 NOTE — Patient Instructions (Addendum)
Closed Chain: Shoulder Abduction / Adduction - on Wall    One hand on wall, step to side and return. Stepping causes shoulder to abduct and adduct. Step _5__ times, holding 5 seconds, _2__ times per day.  http://ss.exer.us/267   Copyright  VHI. All rights reserved.  Closed Chain: Shoulder Flexion / Extension - on Wall    Hands on wall, step backward. Return. Stepping causes shoulder flexion and extension Do _5__ times, holding 5 seconds, __2_ times per day.  http://ss.exer.us/265   Copyright  VHI. All rights reserved.              [facility]         [address],  [address]         [phone]         Annia Friendly, PT, CLT   After Breast Cancer Class It is recommended you attend the ABC class to be educated on lymphedema risk reduction. This class is free of charge and lasts for 1 hour. It is a 1-time class.  You need to download the Webex app. We will send you a link. You are scheduled for January 3rd at 11:00.  Scar massage You can begin gentle scar massage to your incisions. Move the skin back and forth and side to side for a few minutes each day. Only give enough pressure to get the skin to move. Then you can gently massage the scars with coconut oil a few minutes.  Compression garment I recommend you get a compression (sports) bra that zips up the front and is snug fitting. Wear this until you don't have any breast swelling.   Home exercise Program Do your new exercises as well as the ones I gave you when we first met. Begin doing the first and last one on the page I gave you in October laying down.   Follow up PT: It is recommended you return every 3 months for the first 3 years following surgery to be assessed on the SOZO machine for an L-Dex score. This helps prevent clinically significant lymphedema in 95% of patients. These follow up screens are 15 minute appointments that you are not billed  for.   Axillary web syndrome (also called cording) can happen after having breast cancer surgery when lymph nodes in the armpit are removed. It presents as if you have a thin cord in your arm and can run from the armpit all the way down into the forearm. If you've had a sentinel node biopsy, the risk is 1-20% and if you've had an axillary lymph node dissection (more than 7 nodes removed), the risk is 36-72%. The ranges vary depending on the research study.  It most often happens 3-4 weeks post-op but can happen sooner or later. There are several possibilities for what cording actually is. Although no one knows for sure as of yet, it may be related to lymphatics, veins, or other tissue. Sometimes cording resolves on its own but other times it requires physical therapy with a therapist who specializes in lymphedema and/or cancer rehab. Treatment typically involves stretching, manual techniques, and exercise. Sometimes cords get "released" while stretching or during manual treatment and the patient may experience the sensation of a "pop." This may feel strange but it is not dangerous and is a sign that the cord has released; range of motion may be improved in the process.

## 2020-10-30 ENCOUNTER — Encounter (HOSPITAL_COMMUNITY): Payer: Self-pay | Admitting: Hematology

## 2020-10-30 ENCOUNTER — Ambulatory Visit: Payer: Medicare Other

## 2020-10-30 DIAGNOSIS — C50412 Malignant neoplasm of upper-outer quadrant of left female breast: Secondary | ICD-10-CM | POA: Diagnosis not present

## 2020-10-30 DIAGNOSIS — Z170 Estrogen receptor positive status [ER+]: Secondary | ICD-10-CM

## 2020-10-30 DIAGNOSIS — Z483 Aftercare following surgery for neoplasm: Secondary | ICD-10-CM

## 2020-10-30 DIAGNOSIS — R600 Localized edema: Secondary | ICD-10-CM

## 2020-10-30 DIAGNOSIS — M25612 Stiffness of left shoulder, not elsewhere classified: Secondary | ICD-10-CM

## 2020-10-30 DIAGNOSIS — R293 Abnormal posture: Secondary | ICD-10-CM

## 2020-10-30 NOTE — Therapy (Signed)
[place], [place], [address] Phone: [phone]   Fax:  [phone]  Physical Therapy Treatment  Patient Details  Name: Jennifer Sellers MRN: [id number] Date of Birth: 30-Jul-1946 Referring Provider (PT): Dr. Autumn Messing   Encounter Date: 10/30/2020   PT End of Session - 10/30/20 1807    Visit Number 3    Number of Visits 10    Date for PT Re-Evaluation 11/26/20    PT Start Time 1324    PT Stop Time 1458    PT Time Calculation (min) 56 min    Activity Tolerance Patient tolerated treatment well    Behavior During Therapy [facility] for tasks assessed/performed           Past Medical History:  Diagnosis Date  . Arthritis    rheumatoid arthritis  . Hypothyroidism   . Leukopenia   . Thyroid disease   . TIA (transient ischemic attack) 2011    Past Surgical History:  Procedure Laterality Date  . ABDOMINAL HYSTERECTOMY    . BREAST LUMPECTOMY WITH RADIOACTIVE SEED AND SENTINEL LYMPH NODE BIOPSY Left 10/05/2020   Procedure: LEFT BREAST LUMPECTOMY WITH RADIOACTIVE SEED AND SENTINEL LYMPH NODE BIOPSY;  Surgeon: Jovita Kussmaul, MD;  Location: [place];  Service: General;  Laterality: Left;  GENERAL AND PECTORAL BLOCK  . CARPAL TUNNEL RELEASE    . COLONOSCOPY    . COLONOSCOPY WITH PROPOFOL N/A 06/30/2018   Procedure: COLONOSCOPY WITH PROPOFOL;  Surgeon: Manya Silvas, MD;  Location: [facility] ENDOSCOPY;  Service: Endoscopy;  Laterality: N/A;  . colpo-ureteroscopy    . lens surgery      There were no vitals filed for this visit.   Subjective Assessment - 10/30/20 1749    Subjective Pt was stressed going to [employer] to buy bras before coming.  Bought several sizes and different ones. I thought I could have them checked today.    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 Patient underwent a left lumpectomy and sentinel node  biopsy (3 negative nodes) on 10/05/2020. It is ER/PR positive and HER2 negative with a Ki67 of 10%.    Currently in Pain? Yes    Pain Score 5     Pain Location Breast    Pain Orientation Left    Pain Onset 1 to 4 weeks ago                             [facility] Adult PT Treatment/Exercise - 10/30/20 0001      Shoulder Exercises: Supine   Other Supine Exercises AA shoulder flexion x 5    Other Supine Exercises stargazer x 5      Manual Therapy   Edema Management Pt. tried on several front zip sports bras and 36 DD fit her well, but she had difficulty fastening herself.  Advised her to wear for several hours to test it out, and if uncomfortable to try a 38 if it provided enough compression. Advised she may bring with her next visit to check    Myofascial Release To area of cording at left axilla    Manual Lymphatic Drainage (MLD) MLD to left inguinal and right axillary LN,Anterior interaxillary pathway, left axillo inguinal pathway and left lateral breast in supine and SL    Passive ROM PROM left shoulder flex, abduction, ER  PT Education - 10/30/20 1804    Education Details Pt educated about proper fit of bra, but since she struggled to get it on I advised she could try a 38 and if it gave enough compression and was easier for her to don to bring it with her and we could check the 36 and 38 again. 36 DD was more comfortable for her once I adjusted the straps, but she had trouble donning independently    Person(s) Educated Patient    Methods Explanation;Demonstration    Comprehension Verbalized understanding;Other (comment)   may need recheck              PT Giles Term Goals - 10/29/20 1217      PT Harrower TERM GOAL #1   Title Patient will demonstrate she has regained full shoulder ROM and function post operatively compared to baselines.    Time 4    Period Weeks    Status On-going    [place] Date 11/26/20      PT Kivi TERM GOAL #2   Title  Patient will report she is able to lay in the required postion for radiation without c/o increaed pain.    Time 4    Period Weeks    Status New    [place] Date 11/26/20      PT Wares TERM GOAL #3   Title Patient will improve her DASH score to be </= 13.64 for improved overall arm function and to achieve baseline score.    Time 4    Period Weeks    Status New    Target Date 11/26/20      PT Kreps TERM GOAL #4   Title Patient will improve left shoulder active flexion and abduction to be >/= 140 degrees for increased ease reaching.    Baseline 117 abduction and 120 flexion post op    Time 4    Period Weeks    Status New    Target Date 11/26/20      PT Faas TERM GOAL #5   Title Patient will verbalize good understanding of lymphedema risk reduction practices.    Time 4    Period Weeks    Target Date 11/26/20                 Plan - 10/30/20 1809    Clinical Impression Statement Pt was very stressed when she first arrived, but said "that really wasn't so bad after all when we finished.  Cording does still limited left shoulder abd, but she ws able to get her arm into radiation position.  Maintaining position may be difficult for her.  She did bring in several bras and the 36 DD fit well but was difficult for her to don by herself.  We did discuss trying a larger size if necessary to help her don easier if it provided enough compression.    Stability/Clinical Decision Making Stable/Uncomplicated    Rehab Potential Excellent    PT Frequency 2x / week    PT Duration 4 weeks    PT Treatment/Interventions ADLs/Self Care Home Management;Therapeutic exercise;Patient/family education;Manual techniques;Manual lymph drainage;Passive range of motion;Scar mobilization    PT Next Visit Plan PROM, myofascial release in left axilla for cording; manual lymph drainage and scar massage left breast, review wall slides    Consulted and Agree with Plan of Care Patient           Patient will  benefit from skilled therapeutic intervention in order to improve the  following deficits and impairments:  Postural dysfunction,Decreased range of motion,Decreased knowledge of precautions,Impaired UE functional use,Pain,Increased edema,Decreased scar mobility  Visit Diagnosis: Malignant neoplasm of upper-outer quadrant of left breast in female, estrogen receptor positive ([place])  Abnormal posture  Aftercare following surgery for neoplasm  Stiffness of left shoulder, not elsewhere classified  Localized edema     Problem List Patient Active Problem List   Diagnosis Date Noted  . Malignant neoplasm of upper-outer quadrant of left breast in female, estrogen receptor positive ([place]) 08/24/2020  . Estrogen deficiency 05/24/2019  . Vitamin D deficiency 08/03/2015  . Routine general medical examination at a health care facility 05/15/2015  . Dyspepsia 08/23/2014  . Abnormal ankle brachial index 08/23/2014  . Rheumatoid arthritis ([place]) 06/19/2014  . Thrombocytopenia ([place]) 06/19/2014  . Encounter for Medicare annual wellness exam 11/11/2013  . Hyperbilirubinemia 08/19/2013  . Hypothyroid 09/21/2012  . NEOPLASMS UNSPEC NATURE BONE SOFT TISSUE&SKIN 10/23/2010  . 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 OTHER SYMPTOMS REFERABLE JOINT SITE UNSPECIFIED 10/23/2010  . CARPAL TUNNEL SYNDROME, HX OF 04/05/2009  . Leukocytopenia 05/02/2008  . DRY MOUTH 04/28/2008  . INSOMNIA, TRANSIENT 04/27/2007  . Osteopenia 04/27/2007  . Hyperlipidemia 04/23/2007  . HIATAL HERNIA 04/23/2007  . POSTMENOPAUSAL STATUS 04/23/2007  . RHEUMATOID FACTOR, POSITIVE 04/23/2007    Claris Pong 10/30/2020, 6:14 PM  [place], [place], [address] Phone: [phone]   Fax:  [phone]  Name: Jennifer Sellers MRN: [id number] Date of Birth: 1946/01/22  Cheral Almas, PT 10/30/20 6:15 PM

## 2020-10-31 ENCOUNTER — Other Ambulatory Visit: Payer: Self-pay | Admitting: Family Medicine

## 2020-10-31 ENCOUNTER — Encounter: Payer: Self-pay | Admitting: Radiation Oncology

## 2020-10-31 ENCOUNTER — Telehealth: Payer: Self-pay | Admitting: Hematology

## 2020-10-31 ENCOUNTER — Other Ambulatory Visit: Payer: Self-pay

## 2020-10-31 ENCOUNTER — Ambulatory Visit
Admission: RE | Admit: 2020-10-31 | Discharge: 2020-10-31 | Disposition: A | Discharge status: Home / Self Care | Payer: Medicare Other | Source: Ambulatory Visit | Attending: Radiation Oncology | Admitting: Radiation Oncology

## 2020-10-31 VITALS — BP 117/83 | HR 73 | Temp 97.5°F | Resp 18 | Ht 67.0 in | Wt 134.4 lb

## 2020-10-31 DIAGNOSIS — E039 Hypothyroidism, unspecified: Secondary | ICD-10-CM | POA: Insufficient documentation

## 2020-10-31 DIAGNOSIS — M069 Rheumatoid arthritis, unspecified: Secondary | ICD-10-CM | POA: Insufficient documentation

## 2020-10-31 DIAGNOSIS — C50412 Malignant neoplasm of upper-outer quadrant of left female breast: Secondary | ICD-10-CM | POA: Insufficient documentation

## 2020-10-31 DIAGNOSIS — D72819 Decreased white blood cell count, unspecified: Secondary | ICD-10-CM | POA: Insufficient documentation

## 2020-10-31 DIAGNOSIS — Z806 Family history of leukemia: Secondary | ICD-10-CM | POA: Insufficient documentation

## 2020-10-31 DIAGNOSIS — Z8673 Personal history of transient ischemic attack (TIA), and cerebral infarction without residual deficits: Secondary | ICD-10-CM | POA: Diagnosis not present

## 2020-10-31 DIAGNOSIS — Z170 Estrogen receptor positive status [ER+]: Secondary | ICD-10-CM | POA: Insufficient documentation

## 2020-10-31 DIAGNOSIS — Z79899 Other long term (current) drug therapy: Secondary | ICD-10-CM | POA: Insufficient documentation

## 2020-10-31 NOTE — Progress Notes (Signed)
Radiation Oncology         [phone] ________________________________  Name: Jennifer Sellers        MRN: [id number]  Date of Service: 10/31/2020 DOB: 19-Apr-1946  CC:Tower, Jennifer Fanny, MD  Truitt Merle, MD     REFERRING PHYSICIAN: Truitt Merle, MD   DIAGNOSIS: The encounter diagnosis was Malignant neoplasm of upper-outer quadrant of left breast in female, estrogen receptor positive ([place]).   HISTORY OF PRESENT ILLNESS: Jennifer Sellers is a 74 y.o. female originally seen in the multidisciplinary breast clinic for a new diagnosis of left breast cancer. The patient was noted to have a focal asymmetry in the left breast in the upper outer quadrant which prompted further diagnostic imaging on 08/02/2020, this area measured approximately 1.2 cm, and by ultrasound was located at the 1 o'clock position and measured 9 x 12 x 9 mm.  Her axilla was negative for adenopathy she underwent biopsy of the lung lesion which revealed a grade 1 invasive lobular carcinoma.  The tumor was ER/PR positive, equivocal by IHC and negative by FISH for HER-2, her Ki-67 was 10%.  She underwent lumpectomy with sentinel node biopsy on 10/05/20 that revealed a grade 1 invasive lobular carcinoma measuring 2.3 cm with negative margins, the closest was <1 mm from the superior margin. 3 sampled nodes were negative for disease. Oncotype score was 12 and no chemo is recommended. She's seen today to discuss moving forward with adjuvant radiotherapy.  PREVIOUS RADIATION THERAPY: No   PAST MEDICAL HISTORY:  Past Medical History:  Diagnosis Date  . Arthritis    rheumatoid arthritis  . Hypothyroidism   . Leukopenia   . Thyroid disease   . TIA (transient ischemic attack) 2011       PAST SURGICAL HISTORY: Past Surgical History:  Procedure Laterality Date  . ABDOMINAL HYSTERECTOMY    . BREAST LUMPECTOMY WITH RADIOACTIVE SEED AND SENTINEL LYMPH NODE BIOPSY Left 10/05/2020   Procedure: LEFT BREAST LUMPECTOMY WITH RADIOACTIVE SEED AND  SENTINEL LYMPH NODE BIOPSY;  Surgeon: Jovita Kussmaul, MD;  Location: [place];  Service: General;  Laterality: Left;  GENERAL AND PECTORAL BLOCK  . CARPAL TUNNEL RELEASE    . COLONOSCOPY    . COLONOSCOPY WITH PROPOFOL N/A 06/30/2018   Procedure: COLONOSCOPY WITH PROPOFOL;  Surgeon: Manya Silvas, MD;  Location: [facility] ENDOSCOPY;  Service: Endoscopy;  Laterality: N/A;  . colpo-ureteroscopy    . lens surgery       FAMILY HISTORY:  Family History  Problem Relation Age of Onset  . Cancer Mother        lymphoma  . Cancer Brother        lymphoma  . Cancer Daughter        NHL     SOCIAL HISTORY:  reports that she has never smoked. She has never used smokeless tobacco. She reports current alcohol use. She reports that she does not use drugs. The patient is married and lives in [place]. She is accompanied by her daughter Jennifer Sellers.   ALLERGIES: Cipro [ciprofloxacin hcl], Cortisporin [bacitra-neomycin-polymyxin-hc], Prevnar [pneumococcal 13-val conj vacc], and Sulfonamide derivatives   MEDICATIONS:  Current Outpatient Medications  Medication Sig Dispense Refill  . Cholecalciferol (OPTIMAL-D PO) Take 1 tablet by mouth daily.    . Digestive Aids Mixture (DIGESTION GB) CAPS Take 1 capsule by mouth daily.    [place] Kitchen HYDROcodone-acetaminophen (NORCO/VICODIN) 5-325 MG tablet Take 1-2 tablets by mouth every 6 (six) hours as needed for moderate pain or severe  pain. 15 tablet 0  . levothyroxine (SYNTHROID) 50 MCG tablet TAKE 1 TABLET DAILY AND TAKE 2 TABLETS ON SUNDAY 104 tablet 3  . Multiple Vitamins-Minerals (PRESERVISION AREDS 2 PO) Take 2 capsules by mouth daily.     . NON FORMULARY 2 drops daily. sustane    . NON FORMULARY Take 1 tablet by mouth daily. Dietary supplement    . OVER THE COUNTER MEDICATION Take 1 tablet by mouth daily. opti-methyl-b    . OVER THE COUNTER MEDICATION Osteo-mins PM with D+K1+K2    . OVER THE COUNTER MEDICATION Osteo mins pm calcium    . OVER THE  COUNTER MEDICATION Thore Pic Mins    . OVER THE COUNTER MEDICATION Optimal longevi D K2    . OVER THE COUNTER MEDICATION Liqua A     No current facility-administered medications for this encounter.     REVIEW OF SYSTEMS: On review of systems, the patient reports that she is doing well overall. She has noticed some cording of her left axilla without edema, or any concerns of her surgical site. She reports her RA is stable and she has not had any recent changes in medications and has no intentions of starting and has never been on methotrexate.      PHYSICAL EXAM:  Wt Readings from Last 3 Encounters:  10/05/20 134 lb 7.7 oz (61 kg)  08/29/20 135 lb 3.2 oz (61.3 kg)  05/30/20 138 lb 3 oz (62.7 kg)   Temp Readings from Last 3 Encounters:  10/05/20 98.2 F (36.8 C) (Oral)  08/29/20 97.9 F (36.6 C) (Tympanic)  05/30/20 (!) 96.9 F (36.1 C) (Temporal)   BP Readings from Last 3 Encounters:  10/05/20 120/72  08/29/20 140/81  05/30/20 124/72   Pulse Readings from Last 3 Encounters:  10/05/20 74  08/29/20 72  05/30/20 78    In general this is a well appearing Caucasian female in no acute distress. She's alert and oriented x4 and appropriate throughout the examination. Cardiopulmonary assessment is negative for acute distress and she exhibits normal effort. Bilateral breast exam reveals a well healed left breast incision site without fullness. There is palpable cording of the left axillary musculature. No edema is noted of these areas or erythema visible.   ECOG = 0  0 - Asymptomatic (Fully active, able to carry on all predisease activities without restriction)  1 - Symptomatic but completely ambulatory (Restricted in physically strenuous activity but ambulatory and able to carry out work of a light or sedentary nature. For example, light housework, office work)  2 - Symptomatic, <50% in bed during the day (Ambulatory and capable of all self care but unable to carry out any work  activities. Up and about more than 50% of waking hours)  3 - Symptomatic, >50% in bed, but not bedbound (Capable of only limited self-care, confined to bed or chair 50% or more of waking hours)  4 - Bedbound (Completely disabled. Cannot carry on any self-care. Totally confined to bed or chair)  5 - Death   Eustace Pen MM, Creech RH, Tormey DC, et al. [phone]). "Toxicity and response criteria of the [facility]". [place] Oncol. 5 (6): 649-55    LABORATORY DATA:  Lab Results  Component Value Date   WBC 3.1 (L) 08/29/2020   HGB 12.0 08/29/2020   HCT 35.0 (L) 08/29/2020   MCV 93.6 08/29/2020   PLT 151 08/29/2020   Lab Results  Component Value Date   NA 142 08/29/2020  K 4.0 08/29/2020   CL 106 08/29/2020   CO2 32 08/29/2020   Lab Results  Component Value Date   ALT 8 08/29/2020   AST 18 08/29/2020   ALKPHOS 79 08/29/2020   BILITOT 2.1 (H) 08/29/2020      RADIOGRAPHY: NM Sentinel Node Inj-No Rpt (Breast)  Result Date: 10/05/2020 Sulfur colloid was injected by the nuclear medicine technologist for melanoma sentinel node.       IMPRESSION/PLAN: 1. Stage IA, pT2N0M0 grade 1, ER/PR positive invasive lobular carcinoma of the left breast. Dr. Lisbeth Renshaw discusses the final pathology findings and reviews the nature of invasive left breast disease.  Dr. Lisbeth Renshaw reviews the rationale for external radiotherapy to the breast followed by antiestrogen therapy in order to reduce the risks of recurrence. The patient has considered our last discussion and is interested in proceeding with adjuvant radiation. Dr. Lisbeth Renshaw discusses the delivery and logistics of radiotherapy and recommends 4  weeks of radiotherapy. Written consent is obtained and placed in the chart, a copy was provided to the patient. She will simulate this morning after our discussion. 2. Rheumatoid arthritis. The patient does not take immunomodulators but if this were to change,she will let us know.   In a visit  lasting 60 minutes, greater than 50% of the time was spent face to face reviewing her case, as well as in preparation of, discussing, and coordinating the patient's care.  The above documentation reflects my direct findings during this shared patient visit. Please see the separate note by Dr. Lisbeth Renshaw on this date for the remainder of the patient's plan of care.    Carola Rhine, PAC

## 2020-10-31 NOTE — Telephone Encounter (Signed)
Called pt per 12/15 sch msg :   Scheduling Message Entered by Orrin Brigham on 10/31/2020 at 11:47 AM Priority: High COVID-19 PFIZER 3RD DOSE  Department: [facility]  Provider:   Appointment Notes:  Please schedule patient for Booster as soon as possible.      No answer - left message for patient to call back to schedule appt.

## 2020-11-05 ENCOUNTER — Encounter: Payer: Self-pay | Admitting: Physical Therapy

## 2020-11-05 ENCOUNTER — Ambulatory Visit: Payer: Medicare Other | Admitting: Physical Therapy

## 2020-11-05 ENCOUNTER — Other Ambulatory Visit: Payer: Self-pay

## 2020-11-05 ENCOUNTER — Encounter: Payer: Self-pay | Admitting: *Deleted

## 2020-11-05 DIAGNOSIS — M25612 Stiffness of left shoulder, not elsewhere classified: Secondary | ICD-10-CM

## 2020-11-05 DIAGNOSIS — C50412 Malignant neoplasm of upper-outer quadrant of left female breast: Secondary | ICD-10-CM | POA: Diagnosis not present

## 2020-11-05 DIAGNOSIS — R600 Localized edema: Secondary | ICD-10-CM

## 2020-11-05 DIAGNOSIS — R293 Abnormal posture: Secondary | ICD-10-CM

## 2020-11-05 DIAGNOSIS — Z483 Aftercare following surgery for neoplasm: Secondary | ICD-10-CM

## 2020-11-05 NOTE — Therapy (Signed)
[place], [place], [address] Phone: [phone]   Fax:  [phone]  Physical Therapy Treatment  Patient Details  Name: Jennifer Sellers MRN: [id number] Date of Birth: May 08, 1946 Referring Provider (PT): Dr. Autumn Messing   Encounter Date: 11/05/2020   PT End of Session - 11/05/20 1101    Visit Number 4    Number of Visits 10    Date for PT Re-Evaluation 11/26/20    PT Start Time 1009    PT Stop Time 1059    PT Time Calculation (min) 50 min    Activity Tolerance Patient tolerated treatment well    Behavior During Therapy [facility] for tasks assessed/performed           Past Medical History:  Diagnosis Date   Arthritis    rheumatoid arthritis   Hypothyroidism    Leukopenia    Thyroid disease    TIA (transient ischemic attack) 2011    Past Surgical History:  Procedure Laterality Date   ABDOMINAL HYSTERECTOMY     BREAST LUMPECTOMY WITH RADIOACTIVE SEED AND SENTINEL LYMPH NODE BIOPSY Left 10/05/2020   Procedure: LEFT BREAST LUMPECTOMY WITH RADIOACTIVE SEED AND SENTINEL LYMPH NODE BIOPSY;  Surgeon: Jovita Kussmaul, MD;  Location: [place];  Service: General;  Laterality: Left;  GENERAL AND PECTORAL BLOCK   CARPAL TUNNEL RELEASE     COLONOSCOPY     COLONOSCOPY WITH PROPOFOL N/A 06/30/2018   Procedure: COLONOSCOPY WITH PROPOFOL;  Surgeon: Manya Silvas, MD;  Location: [facility] ENDOSCOPY;  Service: Endoscopy;  Laterality: N/A;   colpo-ureteroscopy     lens surgery      There were no vitals filed for this visit.   Subjective Assessment - 11/05/20 1009    Subjective I am anxious to get going.    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 Patient underwent a left lumpectomy and sentinel node biopsy (3 negative nodes) on 10/05/2020. It is ER/PR positive and HER2 negative with a Ki67 of 10%.    Patient Stated  Goals See if my arm is doing ok    Currently in Pain? No/denies    Pain Score 0-No pain                             [facility] Adult PT Treatment/Exercise - 11/05/20 0001      Manual Therapy   Myofascial Release To area of cording at left axilla. to scar tissue at left lumpectomy scar mostly working along edge of scar and not directly over scar- scar softened by end of session    Manual Lymphatic Drainage (MLD) in supine: short neck, 5 diaphragmatic breaths, right axillary nodes and establishment of interaxillary pathway, left inguinal nodes and establishment of axillo inguinal pathway, left breast moving fluid towards pathways then retracing all steps    Passive ROM PROM left shoulder flex and abduction                       PT Haun Term Goals - 10/29/20 1217      PT [place] TERM GOAL #1   Title Patient will demonstrate she has regained full shoulder ROM and function post operatively compared to baselines.    Time 4    Period Weeks    Status On-going    Target Date 11/26/20      PT Tabb  TERM GOAL #2   Title Patient will report she is able to lay in the required postion for radiation without c/o increaed pain.    Time 4    Period Weeks    Status New    Target Date 11/26/20      PT [place] TERM GOAL #3   Title Patient will improve her DASH score to be </= 13.64 for improved overall arm function and to achieve baseline score.    Time 4    Period Weeks    Status New    Target Date 11/26/20      PT [place] TERM GOAL #4   Title Patient will improve left shoulder active flexion and abduction to be >/= 140 degrees for increased ease reaching.    Baseline 117 abduction and 120 flexion post op    Time 4    Period Weeks    Status New    Target Date 11/26/20      PT [place]ul TERM GOAL #5   Title Patient will verbalize good understanding of lymphedema risk reduction practices.    Time 4    Period Weeks    Target Date 11/26/20                 Plan -  11/05/20 1054    Clinical Impression Statement Continued MLD to L breast and instructed pt in principles of self MLD throughout. Will issue handout at next session and have pt return demonstrate. Began gentle scar mobilization to lumpectomy scar where pt had increased scar tissue and tenderness. Continued with PROM to L shoulder with prolonged holds to increased flexion and abduction. Scar tissue softened signficantly this visit.    PT Frequency 2x / week    PT Duration 4 weeks    PT Treatment/Interventions ADLs/Self Care Home Management;Therapeutic exercise;Patient/family education;Manual techniques;Manual lymph drainage;Passive range of motion;Scar mobilization    PT Next Visit Plan PROM, myofascial release in left axilla for cording; manual lymph drainage and scar massage left breast, review wall slides    PT Home Exercise Plan Post op shoulder ROM HEP and closed chain flexion and abduction    Consulted and Agree with Plan of Care Patient           Patient will benefit from skilled therapeutic intervention in order to improve the following deficits and impairments:  Postural dysfunction,Decreased range of motion,Decreased knowledge of precautions,Impaired UE functional use,Pain,Increased edema,Decreased scar mobility  Visit Diagnosis: Stiffness of left shoulder, not elsewhere classified  Localized edema  Aftercare following surgery for neoplasm  Abnormal posture     Problem List Patient Active Problem List   Diagnosis Date Noted   Malignant neoplasm of upper-outer quadrant of left breast in female, estrogen receptor positive ([place]) 08/24/2020   Estrogen deficiency 05/24/2019   Vitamin D deficiency 08/03/2015   Routine general medical examination at a health care facility 05/15/2015   Dyspepsia 08/23/2014   Abnormal ankle brachial index 08/23/2014   Rheumatoid arthritis ([place]) 06/19/2014   Thrombocytopenia ([place]) 06/19/2014   Encounter for Medicare annual wellness exam  11/11/2013   Hyperbilirubinemia 08/19/2013   Hypothyroid 09/21/2012   NEOPLASMS UNSPEC NATURE BONE SOFT TISSUE&SKIN 10/23/2010   GOUTY TOPHI 10/23/2010   OTHER SYMPTOMS REFERABLE JOINT SITE UNSPECIFIED 10/23/2010   CARPAL TUNNEL SYNDROME, HX OF 04/05/2009   Leukocytopenia 05/02/2008   DRY MOUTH 04/28/2008   INSOMNIA, TRANSIENT 04/27/2007   Osteopenia 04/27/2007   Hyperlipidemia 04/23/2007   HIATAL HERNIA 04/23/2007   POSTMENOPAUSAL STATUS 04/23/2007   RHEUMATOID  FACTOR, POSITIVE 04/23/2007    Allyson Sabal [facility] 11/05/2020, 11:02 AM  [place], [place], [address] Phone: [phone]   Fax:  [phone]  Name: Jennifer Sellers MRN: [id number] Date of Birth: [birth year]  Manus Gunning, PT 11/05/20 11:02 AM

## 2020-11-06 ENCOUNTER — Encounter: Payer: Self-pay | Admitting: Hematology

## 2020-11-06 ENCOUNTER — Telehealth: Payer: Self-pay | Admitting: Hematology

## 2020-11-06 DIAGNOSIS — C50412 Malignant neoplasm of upper-outer quadrant of left female breast: Secondary | ICD-10-CM | POA: Diagnosis not present

## 2020-11-06 NOTE — Telephone Encounter (Signed)
Scheduled appt per 12/20 sch msg - mailed reminder letter with appt date and time   

## 2020-11-07 ENCOUNTER — Ambulatory Visit: Payer: Medicare Other | Admitting: Physical Therapy

## 2020-11-12 ENCOUNTER — Ambulatory Visit: Payer: Medicare Other | Admitting: Radiation Oncology

## 2020-11-13 ENCOUNTER — Encounter: Payer: Self-pay | Admitting: Rehabilitation

## 2020-11-13 ENCOUNTER — Other Ambulatory Visit: Payer: Self-pay

## 2020-11-13 ENCOUNTER — Ambulatory Visit: Payer: Medicare Other

## 2020-11-13 ENCOUNTER — Ambulatory Visit: Payer: Medicare Other | Admitting: Rehabilitation

## 2020-11-13 DIAGNOSIS — C50412 Malignant neoplasm of upper-outer quadrant of left female breast: Secondary | ICD-10-CM | POA: Diagnosis not present

## 2020-11-13 DIAGNOSIS — Z483 Aftercare following surgery for neoplasm: Secondary | ICD-10-CM

## 2020-11-13 DIAGNOSIS — R600 Localized edema: Secondary | ICD-10-CM

## 2020-11-13 DIAGNOSIS — R293 Abnormal posture: Secondary | ICD-10-CM

## 2020-11-13 DIAGNOSIS — M25612 Stiffness of left shoulder, not elsewhere classified: Secondary | ICD-10-CM

## 2020-11-13 NOTE — Patient Instructions (Addendum)
Self manual lymph drainage: Perform this sequence once a day.  Only give enough pressure no your skin to make the skin move.  Diaphragmatic - Supine   Inhale through nose making navel move out toward hands. Exhale through puckered lips, hands follow navel in.   Hug yourself.  Do circles at your neck just above your collarbones.  Repeat this 10 times.  Axilla - One at a Time   Using full weight of flat hand and fingers at center of uninvolved armpit, make _10__ in-place circles.   Copyright  VHI. All rights reserved.  LEG: Inguinal Nodes Stimulation   With small finger side of hand against hip crease on involved side, gently perform circles at the crease. Repeat __10_ times.   Copyright  VHI. All rights reserved.  1) Axilla to Inguinal Nodes - Sweep   On involved side, sweep _4__ times from armpit along side of trunk to hip crease.    Draw an imaginary diagonal line from upper outer breast through the nipple area toward lower inner breast.  Direct fluid upward and inward from this line toward the pathway across your upper chest .  Do this in three rows to treat all of the upper inner breast tissue, and do each row 3-4x.      Direct fluid to treat all of lower outer breast tissue downward and outward toward      pathway that is aimed at the left groin.                  Finish by doing the pathways as described above going from your involved armpit to the same side groin and going across your upper chest from the involved shoulder to the uninvolved shoulder.  Repeat the steps above where you do circles in your left groin and right armpit. Copyright  VHI. All rights reserved.     Scar Massage  Scar massage is done to improve the mobility of scar, decrease scar tissue from building up, reduce adhesions, and prevent Keloids from forming. Start scar massage after scabs have fallen off by themselves and no open areas. The first few weeks after surgery, it is  normal for a scar to appear pink or red and slightly raised. Scars can itch or have areas of numbness. Some scars may be sensitive.   Direct Scar massage: after scar is healed, no opening, no scab 1.  Place pads of two fingers together directly on the scar starting at one end of the scar. Move the fingers up and down across the scar holding 5 seconds one direction.  Then go opposite direction hold 5 seconds.  2. Move over to the next section of the scar and repeat.  Work your way along the entire length of the scar.   3. Next make diagonal movements along the scar holding 5 seconds at one direction. 4. Next movement is side to side. 5. Do not rub fingers over the scar.  Instead keep firm pressure and move scar over the tissue it is on top   Scar Lift and Roll 12 weeks after surgery. 1. Pinch a small amount of the scar between your first two fingers and thumb.  2. Roll the scar between your fingers for 5 to 15 seconds. 3. Move along the scar and repeat until you have massaged the entire length of scar.   Stop the massage and call your doctor if you notice: 1. Increased redness 2. Bleeding from scar 3. Seepage coming from the  scar 4. Scar is warmer and has increased pain

## 2020-11-13 NOTE — Therapy (Signed)
[place], [place], [address] Phone: [phone]   Fax:  [phone]  Physical Therapy Treatment  Patient Details  Name: Jennifer Sellers MRN: [id number] Date of Birth: [birth year] Referring Provider (PT): Dr. Autumn Messing   Encounter Date: 11/13/2020   PT End of Session - 11/13/20 0851    Visit Number 5    Number of Visits 10    Date for PT Re-Evaluation 11/26/20    PT Start Time 0800    PT Stop Time 0851    PT Time Calculation (min) 51 min    Activity Tolerance Patient tolerated treatment well    Behavior During Therapy [facility] for tasks assessed/performed           Past Medical History:  Diagnosis Date  . Arthritis    rheumatoid arthritis  . Hypothyroidism   . Leukopenia   . Thyroid disease   . TIA (transient ischemic attack) 2011    Past Surgical History:  Procedure Laterality Date  . ABDOMINAL HYSTERECTOMY    . BREAST LUMPECTOMY WITH RADIOACTIVE SEED AND SENTINEL LYMPH NODE BIOPSY Left 10/05/2020   Procedure: LEFT BREAST LUMPECTOMY WITH RADIOACTIVE SEED AND SENTINEL LYMPH NODE BIOPSY;  Surgeon: Jovita Kussmaul, MD;  Location: [place];  Service: General;  Laterality: Left;  GENERAL AND PECTORAL BLOCK  . CARPAL TUNNEL RELEASE    . COLONOSCOPY    . COLONOSCOPY WITH PROPOFOL N/A 06/30/2018   Procedure: COLONOSCOPY WITH PROPOFOL;  Surgeon: Manya Silvas, MD;  Location[facility] ENDOSCOPY;  Service: Endoscopy;  Laterality: N/A;  . colpo-ureteroscopy    . lens surgery      There were no vitals filed for this visit.   Subjective Assessment - 11/13/20 0755    Subjective Nothing new.  I was supposed to go to radiation this week but one of the machines broke.  I think I have a good bra.    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 Patient underwent a left lumpectomy and sentinel node biopsy (3 negative nodes) on  10/05/2020. It is ER/PR positive and HER2 negative with a Ki67 of 10%.    Patient Stated Goals See if my arm is doing ok    Currently in Pain? No/denies              [facility] PT Assessment - 11/13/20 0001      AROM   Left Shoulder Flexion 140 Degrees (P)    pull in lateral breast   Left Shoulder ABduction 145 Degrees (P)    pull in the anterior shoulder                        [facility] Adult PT Treatment/Exercise - 11/13/20 0001      Manual Therapy   Edema Management gave pt small foam chip pack for use around lumpectomy incision due to scar tissue lumpiness here    Manual Lymphatic Drainage (MLD) in supine with Pt performance focus: 5 diaphragmatic breaths, left axillary nodes, left inguinal nodes and establishment of axillo inguinal pathway, left breast moving fluid towards pathways then retracing all steps. Abbreviated steps for ease of patient learning and time for massage.  Will add full breast steps if needed.    Passive ROM PROM left shoulder flex and abduction  PT [place] Term Goals - 10/29/20 1217      PT [place] TERM GOAL #1   Title Patient will demonstrate she has regained full shoulder ROM and function post operatively compared to baselines.    Time 4    Period Weeks    Status On-going    Target Date 11/26/20      PT Henegar TERM GOAL #2   Title Patient will report she is able to lay in the required postion for radiation without c/o increaed pain.    Time 4    Period Weeks    Status New    Target Date 11/26/20      PT [place] TERM GOAL #3   Title Patient will improve her DASH score to be </= 13.64 for improved overall arm function and to achieve baseline score.    Time 4    Period Weeks    Status New    Target Date 11/26/20      PT [place] TERM GOAL #4   Title Patient will improve left shoulder active flexion and abduction to be >/= 140 degrees for increased ease reaching.    Baseline 117 abduction and 120 flexion post op     Time 4    Period Weeks    Status New    Target Date 11/26/20      PT [place] TERM GOAL #5   Title Patient will verbalize good understanding of lymphedema risk reduction practices.    Time 4    Period Weeks    Target Date 11/26/20                 Plan - 11/13/20 0851    Clinical Impression Statement Focused today on education on self MLD abbreviated to include only inguinal anatasosis due to pt feeling a bit overwhelmed with information but may add as needed.  Also on education regarding scar massage.  Pt has improved AROM by at least 10 deg in flexion and abduction and continues with mild lateral edema and scar tissue fibrosis.    PT Frequency 2x / week    PT Duration 4 weeks    PT Treatment/Interventions ADLs/Self Care Home Management;Therapeutic exercise;Patient/family education;Manual techniques;Manual lymph drainage;Passive range of motion;Scar mobilization    PT Next Visit Plan how was chip pack/self MLD?    cont PROM, myofascial release in left axilla for cording; manual lymph drainage and scar massage left breast, review wall slides    Consulted and Agree with Plan of Care Patient           Patient will benefit from skilled therapeutic intervention in order to improve the following deficits and impairments:  Postural dysfunction,Decreased range of motion,Decreased knowledge of precautions,Impaired UE functional use,Pain,Increased edema,Decreased scar mobility  Visit Diagnosis: Stiffness of left shoulder, not elsewhere classified  Localized edema  Aftercare following surgery for neoplasm  Abnormal posture  Malignant neoplasm of upper-outer quadrant of left breast in female, estrogen receptor positive ([place])     Problem List Patient Active Problem List   Diagnosis Date Noted  . Malignant neoplasm of upper-outer quadrant of left breast in female, estrogen receptor positive ([place]) 08/24/2020  . Estrogen deficiency 05/24/2019  . Vitamin D deficiency 08/03/2015  .  Routine general medical examination at a health care facility 05/15/2015  . Dyspepsia 08/23/2014  . Abnormal ankle brachial index 08/23/2014  . Rheumatoid arthritis ([place]) 06/19/2014  . Thrombocytopenia ([place]) 06/19/2014  . Encounter for Medicare annual wellness exam 11/11/2013  . Hyperbilirubinemia 08/19/2013  .  Hypothyroid 09/21/2012  . NEOPLASMS UNSPEC NATURE BONE SOFT TISSUE&SKIN 10/23/2010  . 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 OTHER SYMPTOMS REFERABLE JOINT SITE UNSPECIFIED 10/23/2010  . CARPAL TUNNEL SYNDROME, HX OF 04/05/2009  . Leukocytopenia 05/02/2008  . DRY MOUTH 04/28/2008  . INSOMNIA, TRANSIENT 04/27/2007  . Osteopenia 04/27/2007  . Hyperlipidemia 04/23/2007  . HIATAL HERNIA 04/23/2007  . POSTMENOPAUSAL STATUS 04/23/2007  . RHEUMATOID FACTOR, POSITIVE 04/23/2007    ,  R 11/13/2020, 8:53 AM  [facility] [address] [place], [place], [address] Phone: [phone]   Fax:  [phone]  Name: Jennifer Sellers MRN: [id number] Date of Birth: 06/19/1946   

## 2020-11-14 ENCOUNTER — Ambulatory Visit: Payer: Medicare Other

## 2020-11-15 ENCOUNTER — Other Ambulatory Visit: Payer: Self-pay

## 2020-11-15 ENCOUNTER — Ambulatory Visit: Payer: Medicare Other

## 2020-11-15 DIAGNOSIS — R600 Localized edema: Secondary | ICD-10-CM

## 2020-11-15 DIAGNOSIS — M25612 Stiffness of left shoulder, not elsewhere classified: Secondary | ICD-10-CM

## 2020-11-15 DIAGNOSIS — R293 Abnormal posture: Secondary | ICD-10-CM

## 2020-11-15 DIAGNOSIS — Z483 Aftercare following surgery for neoplasm: Secondary | ICD-10-CM

## 2020-11-15 DIAGNOSIS — C50412 Malignant neoplasm of upper-outer quadrant of left female breast: Secondary | ICD-10-CM

## 2020-11-15 NOTE — Therapy (Signed)
[place], [place], [address] Phone: [phone]   Fax:  [phone]  Physical Therapy Treatment  Patient Details  Name: Jennifer Sellers MRN: [id number] Date of Birth: 05-15-1946 Referring Provider (PT): Dr. Autumn Messing   Encounter Date: 11/15/2020   PT End of Session - 11/15/20 1455    Visit Number 6    Number of Visits 10    Date for PT Re-Evaluation 11/26/20    PT Start Time 1406    PT Stop Time 1455    PT Time Calculation (min) 49 min    Activity Tolerance Patient tolerated treatment well    Behavior During Therapy [facility] for tasks assessed/performed           Past Medical History:  Diagnosis Date  . Arthritis    rheumatoid arthritis  . Hypothyroidism   . Leukopenia   . Thyroid disease   . TIA (transient ischemic attack) 2011    Past Surgical History:  Procedure Laterality Date  . ABDOMINAL HYSTERECTOMY    . BREAST LUMPECTOMY WITH RADIOACTIVE SEED AND SENTINEL LYMPH NODE BIOPSY Left 10/05/2020   Procedure: LEFT BREAST LUMPECTOMY WITH RADIOACTIVE SEED AND SENTINEL LYMPH NODE BIOPSY;  Surgeon: Jovita Kussmaul, MD;  Location: [place];  Service: General;  Laterality: Left;  GENERAL AND PECTORAL BLOCK  . CARPAL TUNNEL RELEASE    . COLONOSCOPY    . COLONOSCOPY WITH PROPOFOL N/A 06/30/2018   Procedure: COLONOSCOPY WITH PROPOFOL;  Surgeon: Manya Silvas, MD;  Location: [facility] ENDOSCOPY;  Service: Endoscopy;  Laterality: N/A;  . colpo-ureteroscopy    . lens surgery      There were no vitals filed for this visit.   Subjective Assessment - 11/15/20 1407    Subjective Chip pack seemed to work well and seems to help with the swelling. Have tried the MLD just a little at home.    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 Patient underwent a left lumpectomy and sentinel node biopsy (3 negative nodes) on 10/05/2020.  It is ER/PR positive and HER2 negative with a Ki67 of 10%.    Currently in Pain? No/denies    Pain Score 0-No pain                             [facility] Adult PT Treatment/Exercise - 11/15/20 0001      Shoulder Exercises: Supine   Other Supine Exercises AA shoulder flex and startgazer x 5    Other Supine Exercises standing abd at wall X5     Manual Therapy   Myofascial Release To area of cording at left axilla. to scar tissue at left lumpectomy scar mostly working along edge of scar and not directly over scar- scar softened by end of session    Manual Lymphatic Drainage (MLD) in supine by PT with instruction to pt: 5 diaphragmatic breaths, left axillary nodes, left inguinal nodes and establishment of axillo inguinal  And anterior interaxillary pathway, left breast moving fluid towards pathways then retracing all steps. Abbreviated steps for ease of patient learning and time for massage.  Will add full breast steps if needed.    Passive ROM PROM left shoulder flex and abduction                       PT [place] Term Goals - 10/29/20 1217  PT [place] TERM GOAL #1   Title Patient will demonstrate she has regained full shoulder ROM and function post operatively compared to baselines.    Time 4    Period Weeks    Status On-going    Target Date 11/26/20      PT Rech TERM GOAL #2   Title Patient will report she is able to lay in the required postion for radiation without c/o increaed pain.    Time 4    Period Weeks    Status New    Target Date 11/26/20      PT [place] TERM GOAL #3   Title Patient will improve her DASH score to be </= 13.64 for improved overall arm function and to achieve baseline score.    Time 4    Period Weeks    Status New    Target Date 11/26/20      PT [place] TERM GOAL #4   Title Patient will improve left shoulder active flexion and abduction to be >/= 140 degrees for increased ease reaching.    Baseline 117 abduction and 120 flexion post  op    Time 4    Period Weeks    Status New    Target Date 11/26/20      PT [place] TERM GOAL #5   Title Patient will verbalize good understanding of lymphedema risk reduction practices.    Time 4    Period Weeks    Target Date 11/26/20                 Plan - 11/15/20 1456    Clinical Impression Statement Therapy consisted of MFR to areas of cording, scar mobilization, PROM of the left shoulder and MLD and review of MLD with pt.  Shoulder ROM continues to improve and pt is relaxing better.  She was encouraged to continue stretches over the weekend since she starts radiation on Monday.  She required VC's and TC's to do MLD correctly but with cueing did improve.    Stability/Clinical Decision Making Stable/Uncomplicated    Rehab Potential Excellent    PT Frequency 2x / week    PT Duration 4 weeks    PT Treatment/Interventions ADLs/Self Care Home Management;Therapeutic exercise;Patient/family education;Manual techniques;Manual lymph drainage;Passive range of motion;Scar mobilization    PT Next Visit Plan how was chip pack/self MLD?    cont PROM, myofascial release in left axilla for cording; manual lymph drainage and scar massage left breast, review wall slides    PT Home Exercise Plan Post op shoulder ROM HEP and closed chain flexion and abduction    Consulted and Agree with Plan of Care Patient           Patient will benefit from skilled therapeutic intervention in order to improve the following deficits and impairments:  Postural dysfunction,Decreased range of motion,Decreased knowledge of precautions,Impaired UE functional use,Pain,Increased edema,Decreased scar mobility  Visit Diagnosis: Stiffness of left shoulder, not elsewhere classified  Localized edema  Aftercare following surgery for neoplasm  Abnormal posture  Malignant neoplasm of upper-outer quadrant of left breast in female, estrogen receptor positive ([place])     Problem List Patient Active Problem List    Diagnosis Date Noted  . Malignant neoplasm of upper-outer quadrant of left breast in female, estrogen receptor positive ([place]) 08/24/2020  . Estrogen deficiency 05/24/2019  . Vitamin D deficiency 08/03/2015  . Routine general medical examination at a health care facility 05/15/2015  . Dyspepsia 08/23/2014  . Abnormal ankle brachial index 08/23/2014  .  Rheumatoid arthritis ([place]) 06/19/2014  . Thrombocytopenia ([place]) 06/19/2014  . Encounter for Medicare annual wellness exam 11/11/2013  . Hyperbilirubinemia 08/19/2013  . Hypothyroid 09/21/2012  . NEOPLASMS UNSPEC NATURE BONE SOFT TISSUE&SKIN 10/23/2010  . 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 OTHER SYMPTOMS REFERABLE JOINT SITE UNSPECIFIED 10/23/2010  . CARPAL TUNNEL SYNDROME, HX OF 04/05/2009  . Leukocytopenia 05/02/2008  . DRY MOUTH 04/28/2008  . INSOMNIA, TRANSIENT 04/27/2007  . Osteopenia 04/27/2007  . Hyperlipidemia 04/23/2007  . HIATAL HERNIA 04/23/2007  . POSTMENOPAUSAL STATUS 04/23/2007  . RHEUMATOID FACTOR, POSITIVE 04/23/2007    Claris Pong 11/15/2020, 2:59 PM  [place], [place], [address] Phone: [phone]   Fax:  [phone]  Name: KHALIS HITTLE MRN: [id number] Date of Birth: Nov 13, 1946 Cheral Almas, PT 11/15/20 3:01 PM

## 2020-11-19 ENCOUNTER — Ambulatory Visit
Admission: RE | Admit: 2020-11-19 | Discharge: 2020-11-19 | Disposition: A | Discharge status: Home / Self Care | Payer: Medicare Other | Source: Ambulatory Visit | Attending: Radiation Oncology | Admitting: Radiation Oncology

## 2020-11-19 ENCOUNTER — Other Ambulatory Visit: Payer: Self-pay

## 2020-11-19 DIAGNOSIS — C50412 Malignant neoplasm of upper-outer quadrant of left female breast: Secondary | ICD-10-CM | POA: Insufficient documentation

## 2020-11-19 DIAGNOSIS — Z170 Estrogen receptor positive status [ER+]: Secondary | ICD-10-CM | POA: Diagnosis not present

## 2020-11-20 ENCOUNTER — Ambulatory Visit
Admission: RE | Admit: 2020-11-20 | Discharge: 2020-11-20 | Disposition: A | Discharge status: Home / Self Care | Payer: Medicare Other | Source: Ambulatory Visit | Attending: Radiation Oncology | Admitting: Radiation Oncology

## 2020-11-20 ENCOUNTER — Other Ambulatory Visit: Payer: Self-pay

## 2020-11-20 DIAGNOSIS — C50412 Malignant neoplasm of upper-outer quadrant of left female breast: Secondary | ICD-10-CM | POA: Diagnosis not present

## 2020-11-21 ENCOUNTER — Other Ambulatory Visit: Payer: Self-pay

## 2020-11-21 ENCOUNTER — Ambulatory Visit: Payer: Medicare Other | Attending: General Surgery

## 2020-11-21 ENCOUNTER — Ambulatory Visit
Admission: RE | Admit: 2020-11-21 | Discharge: 2020-11-21 | Disposition: A | Discharge status: Home / Self Care | Payer: Medicare Other | Source: Ambulatory Visit | Attending: Radiation Oncology | Admitting: Radiation Oncology

## 2020-11-21 DIAGNOSIS — C50412 Malignant neoplasm of upper-outer quadrant of left female breast: Secondary | ICD-10-CM | POA: Diagnosis present

## 2020-11-21 DIAGNOSIS — R293 Abnormal posture: Secondary | ICD-10-CM | POA: Diagnosis present

## 2020-11-21 DIAGNOSIS — R600 Localized edema: Secondary | ICD-10-CM | POA: Insufficient documentation

## 2020-11-21 DIAGNOSIS — Z170 Estrogen receptor positive status [ER+]: Secondary | ICD-10-CM | POA: Diagnosis present

## 2020-11-21 DIAGNOSIS — Z483 Aftercare following surgery for neoplasm: Secondary | ICD-10-CM | POA: Insufficient documentation

## 2020-11-21 DIAGNOSIS — M25612 Stiffness of left shoulder, not elsewhere classified: Secondary | ICD-10-CM | POA: Insufficient documentation

## 2020-11-21 NOTE — Therapy (Signed)
[place], [place], [address] Phone: [phone]   Fax:  [phone]  Physical Therapy Treatment  Patient Details  Name: Jennifer Sellers MRN: [id number] Date of Birth: January 08, 1946 Referring Provider (PT): Dr. Autumn Messing   Encounter Date: 11/21/2020   PT End of Session - 11/21/20 1115    Visit Number 7    Number of Visits 10    Date for PT Re-Evaluation 11/26/20    PT Start Time 1010    PT Stop Time 1100    PT Time Calculation (min) 50 min    Activity Tolerance Patient tolerated treatment well    Behavior During Therapy [facility] for tasks assessed/performed           Past Medical History:  Diagnosis Date  . Arthritis    rheumatoid arthritis  . Hypothyroidism   . Leukopenia   . Thyroid disease   . TIA (transient ischemic attack) 2011    Past Surgical History:  Procedure Laterality Date  . ABDOMINAL HYSTERECTOMY    . BREAST LUMPECTOMY WITH RADIOACTIVE SEED AND SENTINEL LYMPH NODE BIOPSY Left 10/05/2020   Procedure: LEFT BREAST LUMPECTOMY WITH RADIOACTIVE SEED AND SENTINEL LYMPH NODE BIOPSY;  Surgeon: Jovita Kussmaul, MD;  Location: [place];  Service: General;  Laterality: Left;  GENERAL AND PECTORAL BLOCK  . CARPAL TUNNEL RELEASE    . COLONOSCOPY    . COLONOSCOPY WITH PROPOFOL N/A 06/30/2018   Procedure: COLONOSCOPY WITH PROPOFOL;  Surgeon: Manya Silvas, MD;  Location[facility] ENDOSCOPY;  Service: Endoscopy;  Laterality: N/A;  . colpo-ureteroscopy    . lens surgery      There were no vitals filed for this visit.   Subjective Assessment - 11/21/20 1110    Subjective Pt reports having radiation this am and then had to wait around for PT appt.  No reported difficulty getting arm into position for radiation.  She did mention the difficulty she had trying to get on Web ex to do the ABC class.  I told her we would discuss many of those things while she is here . She also requested a  print out of her PT and radiation appts.   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 Patient underwent a left lumpectomy and sentinel node biopsy (3 negative nodes) on 10/05/2020. It is ER/PR positive and HER2 negative with a Ki67 of 10%.    Currently in Pain? No/denies    Pain Score 0-No pain                             [facility] Adult PT Treatment/Exercise - 11/21/20 0001      Manual Therapy   Manual therapy comments Reviewed precautions for preventing lymphedema    Myofascial Release To area of cording at left axilla. to scar tissue at left lumpectomy scar mostly working along edge of scar and not directly over scar- scar softened by end of session    Manual Lymphatic Drainage (MLD) in supine with Pt performance focus: 5 diaphragmatic breaths, left axillary nodes, left inguinal nodes and establishment of axillo inguinal pathway, left breast moving fluid towards pathways then retracing all steps. Abbreviated steps for ease of patient learning and time for massage.  Will add full breast steps if needed.    Passive ROM PROM left shoulder flex and abduction  PT Education - 11/21/20 1124    Education Details Pt was educated about ways to prevent or decrease lymphedema risk.  She was given a written handout, and also all of her appts for radiation and PT were printed for pt at her request since she has memory deficits.  We reviewed self MLD with pt requiring a few corrections in technique    Person(s) Educated Patient    Methods Explanation;Handout    Comprehension Verbalized understanding;Returned demonstration;Need further instruction               PT Vestal Term Goals - 10/29/20 1217      PT Breth TERM GOAL #1   Title Patient will demonstrate she has regained full shoulder ROM and function post operatively compared to baselines.    Time 4    Period Weeks    Status On-going     Target Date 11/26/20      PT Comley TERM GOAL #2   Title Patient will report she is able to lay in the required postion for radiation without c/o increaed pain.    Time 4    Period Weeks    Status New    Target Date 11/26/20      PT Wan TERM GOAL #3   Title Patient will improve her DASH score to be </= 13.64 for improved overall arm function and to achieve baseline score.    Time 4    Period Weeks    Status New    Target Date 11/26/20      PT Ticas TERM GOAL #4   Title Patient will improve left shoulder active flexion and abduction to be >/= 140 degrees for increased ease reaching.    Baseline 117 abduction and 120 flexion post op    Time 4    Period Weeks    Status New    Target Date 11/26/20      PT Maroney TERM GOAL #5   Title Patient will verbalize good understanding of lymphedema risk reduction practices.    Time 4    Period Weeks    Target Date 11/26/20                 Plan - 11/21/20 1116    Clinical Impression Statement Therapy consisted of MFR to areas of cording, scar mobilization, PROM of the left shoulder, MLD and review of MLD with pt.  We also discussed precautions for prevention of lymphedema and pt was given a handout.  MLD really softened left breast in area of lumpectomy incision.  Cording is still present and does limit ROM some. A copy of all pt radiation and PT was printed at her request and PT appts highlighted    Stability/Clinical Decision Making Stable/Uncomplicated    Rehab Potential Excellent    PT Frequency 2x / week    PT Duration 4 weeks    PT Treatment/Interventions ADLs/Self Care Home Management;Therapeutic exercise;Patient/family education;Manual techniques;Manual lymph drainage;Passive range of motion;Scar mobilization    PT Next Visit Plan self MLD?    cont PROM, myofascial release in left axilla/forearm for cording; manual lymph drainage and pt review and scar massage left breast, review wall slides    Consulted and Agree with Plan of Care  Patient           Patient will benefit from skilled therapeutic intervention in order to improve the following deficits and impairments:  Postural dysfunction,Decreased range of motion,Decreased knowledge of precautions,Impaired UE functional use,Pain,Increased edema,Decreased scar mobility,Increased fascial restricitons  Visit Diagnosis: Stiffness of left shoulder, not elsewhere classified  Localized edema  Aftercare following surgery for neoplasm  Abnormal posture  Malignant neoplasm of upper-outer quadrant of left breast in female, estrogen receptor positive ([place])     Problem List Patient Active Problem List   Diagnosis Date Noted  . Malignant neoplasm of upper-outer quadrant of left breast in female, estrogen receptor positive ([place]) 08/24/2020  . Estrogen deficiency 05/24/2019  . Vitamin D deficiency 08/03/2015  . Routine general medical examination at a health care facility 05/15/2015  . Dyspepsia 08/23/2014  . Abnormal ankle brachial index 08/23/2014  . Rheumatoid arthritis ([place]) 06/19/2014  . Thrombocytopenia ([place]) 06/19/2014  . Encounter for Medicare annual wellness exam 11/11/2013  . Hyperbilirubinemia 08/19/2013  . Hypothyroid 09/21/2012  . NEOPLASMS UNSPEC NATURE BONE SOFT TISSUE&SKIN 10/23/2010  . 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 OTHER SYMPTOMS REFERABLE JOINT SITE UNSPECIFIED 10/23/2010  . CARPAL TUNNEL SYNDROME, HX OF 04/05/2009  . Leukocytopenia 05/02/2008  . DRY MOUTH 04/28/2008  . INSOMNIA, TRANSIENT 04/27/2007  . Osteopenia 04/27/2007  . Hyperlipidemia 04/23/2007  . HIATAL HERNIA 04/23/2007  . POSTMENOPAUSAL STATUS 04/23/2007  . RHEUMATOID FACTOR, POSITIVE 04/23/2007    [facility] 11/21/2020, 11:27 AM  [place], [place], [address] Phone: [phone]   Fax:  [phone]  Name: Jennifer Sellers MRN: [id number] Date of Birth: 10-Feb-1946 Cheral Almas, PT 11/21/20 11:30  AM

## 2020-11-21 NOTE — Patient Instructions (Signed)
Pt was given written written instructions for ways to prevent lymphedema and all were discussed with pt.

## 2020-11-22 ENCOUNTER — Ambulatory Visit
Admission: RE | Admit: 2020-11-22 | Discharge: 2020-11-22 | Disposition: A | Discharge status: Home / Self Care | Payer: Medicare Other | Source: Ambulatory Visit | Attending: Radiation Oncology | Admitting: Radiation Oncology

## 2020-11-22 ENCOUNTER — Other Ambulatory Visit: Payer: Self-pay

## 2020-11-22 DIAGNOSIS — C50412 Malignant neoplasm of upper-outer quadrant of left female breast: Secondary | ICD-10-CM | POA: Diagnosis not present

## 2020-11-23 ENCOUNTER — Ambulatory Visit
Admission: RE | Admit: 2020-11-23 | Discharge: 2020-11-23 | Disposition: A | Discharge status: Home / Self Care | Payer: Medicare Other | Source: Ambulatory Visit | Attending: Radiation Oncology | Admitting: Radiation Oncology

## 2020-11-23 ENCOUNTER — Other Ambulatory Visit: Payer: Self-pay

## 2020-11-23 ENCOUNTER — Ambulatory Visit: Payer: Medicare Other

## 2020-11-23 DIAGNOSIS — C50412 Malignant neoplasm of upper-outer quadrant of left female breast: Secondary | ICD-10-CM | POA: Diagnosis not present

## 2020-11-23 DIAGNOSIS — M25612 Stiffness of left shoulder, not elsewhere classified: Secondary | ICD-10-CM

## 2020-11-23 DIAGNOSIS — Z170 Estrogen receptor positive status [ER+]: Secondary | ICD-10-CM

## 2020-11-23 DIAGNOSIS — R600 Localized edema: Secondary | ICD-10-CM

## 2020-11-23 DIAGNOSIS — Z483 Aftercare following surgery for neoplasm: Secondary | ICD-10-CM

## 2020-11-23 DIAGNOSIS — R293 Abnormal posture: Secondary | ICD-10-CM

## 2020-11-23 NOTE — Therapy (Signed)
[place], [place], [address] Phone: [phone]   Fax:  [phone]  Physical Therapy Treatment  Patient Details  Name: Jennifer Sellers MRN: [id number] Date of Birth: 1946-04-05 Referring Provider (PT): Dr. Autumn Messing   Encounter Date: 11/23/2020   PT End of Session - 11/23/20 1203    Visit Number 8    Number of Visits 10    Date for PT Re-Evaluation 11/26/20    PT Start Time 1100    PT Stop Time 1155    PT Time Calculation (min) 55 min    Activity Tolerance Patient tolerated treatment well    Behavior During Therapy [facility] for tasks assessed/performed           Past Medical History:  Diagnosis Date  . Arthritis    rheumatoid arthritis  . Hypothyroidism   . Leukopenia   . Thyroid disease   . TIA (transient ischemic attack) 2011    Past Surgical History:  Procedure Laterality Date  . ABDOMINAL HYSTERECTOMY    . BREAST LUMPECTOMY WITH RADIOACTIVE SEED AND SENTINEL LYMPH NODE BIOPSY Left 10/05/2020   Procedure: LEFT BREAST LUMPECTOMY WITH RADIOACTIVE SEED AND SENTINEL LYMPH NODE BIOPSY;  Surgeon: Jovita Kussmaul, MD;  Location: [place];  Service: General;  Laterality: Left;  GENERAL AND PECTORAL BLOCK  . CARPAL TUNNEL RELEASE    . COLONOSCOPY    . COLONOSCOPY WITH PROPOFOL N/A 06/30/2018   Procedure: COLONOSCOPY WITH PROPOFOL;  Surgeon: Manya Silvas, MD;  Location: [facility] ENDOSCOPY;  Service: Endoscopy;  Laterality: N/A;  . colpo-ureteroscopy    . lens surgery      There were no vitals filed for this visit.   Subjective Assessment - 11/23/20 1104    Subjective Had radiation but didn't get to see Dr. Lisbeth Renshaw because she hadn't been checked in properly.  Able to get into position. No present pain, but left breast was uncomfortable last night. Pt reports she has been very fatigued with all of these appts and driving from [place].  She doesn't do much at home with exs but has been  trying some MLD.    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 Patient underwent a left lumpectomy and sentinel node biopsy (3 negative nodes) on 10/05/2020. It is ER/PR positive and HER2 negative with a Ki67 of 10%.    Patient Stated Goals See if my arm is doing ok    Pain Score 0-No pain                             [facility] Adult PT Treatment/Exercise - 11/23/20 0001      Manual Therapy   Myofascial Release To area of cording at left axilla. to scar tissue at left lumpectomy scar mostly working along edge of scar and not directly over scar- scar softened by end of session    Manual Lymphatic Drainage (MLD) in supine : 5 diaphragmatic breaths, left axillary nodes, left inguinal nodes and establishment of axillo inguinal pathway, left breast moving fluid towards pathways then retracing all steps. Also did some MLD at left lateral upper arm, medial upper arm to lateral retracing all steps    Passive ROM PROM left shoulder flex and abduction                       PT Hallenbeck Term  Goals - 10/29/20 1217      PT Cressey TERM GOAL #1   Title Patient will demonstrate she has regained full shoulder ROM and function post operatively compared to baselines.    Time 4    Period Weeks    Status On-going    Target Date 11/26/20      PT Manninen TERM GOAL #2   Title Patient will report she is able to lay in the required postion for radiation without c/o increaed pain.    Time 4    Period Weeks    Status New    Target Date 11/26/20      PT Swing TERM GOAL #3   Title Patient will improve her DASH score to be </= 13.64 for improved overall arm function and to achieve baseline score.    Time 4    Period Weeks    Status New    Target Date 11/26/20      PT Bringle TERM GOAL #4   Title Patient will improve left shoulder active flexion and abduction to be >/= 140 degrees for increased ease reaching.    Baseline 117  abduction and 120 flexion post op    Time 4    Period Weeks    Status New    Target Date 11/26/20      PT Tupou TERM GOAL #5   Title Patient will verbalize good understanding of lymphedema risk reduction practices.    Time 4    Period Weeks    Target Date 11/26/20                 Plan - 11/23/20 1203    Clinical Impression Statement Pt was very distraught and confused today over an appt that she didn't get to have with Dr. Lisbeth Renshaw.  she was advised to call when she gets home today.  Cording is still present and limits shoulder ROM some. Mild fibosis noted at incision that softened with manual work.  Overall improved and due for Progress note next visit.    Stability/Clinical Decision Making Stable/Uncomplicated    Rehab Potential Excellent    PT Frequency 2x / week    PT Duration 4 weeks    PT Treatment/Interventions ADLs/Self Care Home Management;Therapeutic exercise;Patient/family education;Manual techniques;Manual lymph drainage;Passive range of motion;Scar mobilization    PT Next Visit Plan Recert prn, self MLD?    cont PROM, myofascial release in left axilla/forearm for cording; manual lymph drainage and pt review and scar massage left breast, review wall slides    Consulted and Agree with Plan of Care Patient           Patient will benefit from skilled therapeutic intervention in order to improve the following deficits and impairments:  Postural dysfunction,Decreased range of motion,Decreased knowledge of precautions,Impaired UE functional use,Pain,Increased edema,Decreased scar mobility,Increased fascial restricitons  Visit Diagnosis: Stiffness of left shoulder, not elsewhere classified  Localized edema  Aftercare following surgery for neoplasm  Abnormal posture  Malignant neoplasm of upper-outer quadrant of left breast in female, estrogen receptor positive ([place])     Problem List Patient Active Problem List   Diagnosis Date Noted  . Malignant neoplasm of  upper-outer quadrant of left breast in female, estrogen receptor positive ([place]) 08/24/2020  . Estrogen deficiency 05/24/2019  . Vitamin D deficiency 08/03/2015  . Routine general medical examination at a health care facility 05/15/2015  . Dyspepsia 08/23/2014  . Abnormal ankle brachial index 08/23/2014  . Rheumatoid arthritis ([place]) 06/19/2014  . Thrombocytopenia ([place])  06/19/2014  . Encounter for Medicare annual wellness exam 11/11/2013  . Hyperbilirubinemia 08/19/2013  . Hypothyroid 09/21/2012  . NEOPLASMS UNSPEC NATURE BONE SOFT TISSUE&SKIN 10/23/2010  . 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 OTHER SYMPTOMS REFERABLE JOINT SITE UNSPECIFIED 10/23/2010  . CARPAL TUNNEL SYNDROME, HX OF 04/05/2009  . Leukocytopenia 05/02/2008  . DRY MOUTH 04/28/2008  . INSOMNIA, TRANSIENT 04/27/2007  . Osteopenia 04/27/2007  . Hyperlipidemia 04/23/2007  . HIATAL HERNIA 04/23/2007  . POSTMENOPAUSAL STATUS 04/23/2007  . RHEUMATOID FACTOR, POSITIVE 04/23/2007    Elsie Ra [facility] 11/23/2020, 12:09 PM  [place], [place], [address] Phone: [phone]   Fax:  [phone]  Name: JOCHEBED BILLS MRN: [id number] Date of Birth: 01-15-1946 Cheral Almas, PT 11/23/20 12:09 PM

## 2020-11-26 ENCOUNTER — Ambulatory Visit: Payer: Medicare Other

## 2020-11-26 ENCOUNTER — Ambulatory Visit
Admission: RE | Admit: 2020-11-26 | Discharge: 2020-11-26 | Disposition: A | Discharge status: Home / Self Care | Payer: Medicare Other | Source: Ambulatory Visit | Attending: Radiation Oncology | Admitting: Radiation Oncology

## 2020-11-26 ENCOUNTER — Other Ambulatory Visit: Payer: Self-pay

## 2020-11-26 DIAGNOSIS — R293 Abnormal posture: Secondary | ICD-10-CM

## 2020-11-26 DIAGNOSIS — M25612 Stiffness of left shoulder, not elsewhere classified: Secondary | ICD-10-CM

## 2020-11-26 DIAGNOSIS — C50412 Malignant neoplasm of upper-outer quadrant of left female breast: Secondary | ICD-10-CM | POA: Diagnosis not present

## 2020-11-26 DIAGNOSIS — R600 Localized edema: Secondary | ICD-10-CM

## 2020-11-26 DIAGNOSIS — Z483 Aftercare following surgery for neoplasm: Secondary | ICD-10-CM

## 2020-11-26 NOTE — Therapy (Signed)
[facility] [address] [place], [place], [address] Phone: [phone]   Fax:  [phone]  Physical Therapy Treatment  Patient Details  Name: Jennifer Sellers MRN: [id number] Date of Birth: 1946-05-06 Referring Provider (PT): Dr. Chevis Pretty   Encounter Date: 11/26/2020   PT End of Session - 11/26/20 1157    Visit Number 9    Number of Visits 14    Date for PT Re-Evaluation 12/14/20    PT Start Time 1102    PT Stop Time 1157    PT Time Calculation (min) 55 min    Activity Tolerance Patient tolerated treatment well    Behavior During Therapy [facility] for tasks assessed/performed           Past Medical History:  Diagnosis Date  . Arthritis    rheumatoid arthritis  . Hypothyroidism   . Leukopenia   . Thyroid disease   . TIA (transient ischemic attack) 2011    Past Surgical History:  Procedure Laterality Date  . ABDOMINAL HYSTERECTOMY    . BREAST LUMPECTOMY WITH RADIOACTIVE SEED AND SENTINEL LYMPH NODE BIOPSY Left 10/05/2020   Procedure: LEFT BREAST LUMPECTOMY WITH RADIOACTIVE SEED AND SENTINEL LYMPH NODE BIOPSY;  Surgeon: Griselda Miner, MD;  Location: [facility];  Service: General;  Laterality: Left;  GENERAL AND PECTORAL BLOCK  . CARPAL TUNNEL RELEASE    . COLONOSCOPY    . COLONOSCOPY WITH PROPOFOL N/A 06/30/2018   Procedure: COLONOSCOPY WITH PROPOFOL;  Surgeon: Scot Jun, MD;  Location: [facility] ENDOSCOPY;  Service: Endoscopy;  Laterality: N/A;  . colpo-ureteroscopy    . lens surgery      There were no vitals filed for this visit.   Subjective Assessment - 11/26/20 1101    Subjective Feel like left shoulder is doing well.  Haven't done too much with my left arm yet. Occasionally left breast aches but I don't know when it started.  I used the chip pack yesterday and it seems to do well.    Pertinent History Patient was diagnosed on 07/16/2020 with left grade I invasive lobular carcinoma breast  cancer. Patient underwent a left lumpectomy and sentinel node biopsy (3 negative nodes) on 10/05/2020. It is ER/PR positive and HER2 negative with a Ki67 of 10%.    Patient Stated Goals See if my arm is doing ok    Currently in Pain? No/denies    Pain Score 0-No pain    Pain Location Breast              [facility] PT Assessment - 11/26/20 0001      AROM   Left Shoulder Extension 70 Degrees    Left Shoulder Flexion 155 Degrees    Left Shoulder ABduction 155 Degrees   pulls at lateral trunk   Left Shoulder Internal Rotation 82 Degrees    Left Shoulder External Rotation 85 Degrees             LYMPHEDEMA/ONCOLOGY QUESTIONNAIRE - 11/26/20 0001      Left Upper Extremity Lymphedema   10 cm Proximal to Olecranon Process 24.4 cm    Olecranon Process 23.4 cm    10 cm Proximal to Ulnar Styloid Process 17.1 cm    Just Proximal to Ulnar Styloid Process 14.9 cm    Across Hand at [facility] 18.9 cm    At [place] of 2nd Digit 5.8 cm              Quick Dash -  11/26/20 0001    Open a tight or new jar Moderate difficulty    Do heavy household chores (wash walls, wash floors) Moderate difficulty    Carry a shopping bag or briefcase Mild difficulty    Wash your back Mild difficulty    Use a knife to cut food No difficulty    Recreational activities in which you take some force or impact through your arm, shoulder, or hand (golf, hammering, tennis) Moderate difficulty    During the past week, to what extent has your arm, shoulder or hand problem interfered with your normal social activities with family, friends, neighbors, or groups? Modererately    During the past week, to what extent has your arm, shoulder or hand problem limited your work or other regular daily activities Modererately    Arm, shoulder, or hand pain. Mild    Tingling (pins and needles) in your arm, shoulder, or hand None    Difficulty Sleeping Mild difficulty    DASH Score 31.82 %                  [facility] Adult  PT Treatment/Exercise - 11/26/20 0001      Shoulder Exercises: Supine   Other Supine Exercises AA shoulder flex supine x 5    Other Supine Exercises stargazer x 5      Manual Therapy   Myofascial Release To area of cording at left axilla. to scar tissue at left lumpectomy scar mostly working along edge of scar and not directly over scar- scar softened by end of session    Passive ROM PROM left shoulder flex and abduction                       PT Haymaker Term Goals - 11/26/20 1118      PT Boike TERM GOAL #1   Title Patient will demonstrate she has regained full shoulder ROM and function post operatively compared to baselines.    Time 4    Period Weeks    Status Achieved      PT [place] TERM GOAL #2   Title Patient will report she is able to lay in the required postion for radiation without c/o increaed pain.    Time 4    Period Weeks    Status Achieved      PT Meek TERM GOAL #3   Title Patient will improve her DASH score to be </= 13.64 for improved overall arm function and to achieve baseline score.    Period Weeks    Status On-going      PT Deland TERM GOAL #4   Title Patient will improve left shoulder active flexion and abduction to be >/= 140 degrees for increased ease reaching.    Period Weeks    Status On-going      PT Horine TERM GOAL #5   Title Patient will verbalize good understanding of lymphedema risk reduction practices.    Baseline requires some cueing    Period Weeks    Status Achieved                 Plan - 11/26/20 1158    Clinical Impression Statement Pt has achieved shoulder ROM goals established at initial evaluation and has improvement in Quick Dash score but has not yet achieved her LTG.  There is n swelling noted in left UE, and mild fibrosis noted at left breast incision.Cording in axilla and antecubital fossa remains but is less prominent.  She is performing self MLD to the left breast at home, but needs review.  We will continue PT until  the end of the month as pt. completes her radiation to be sure pt continues with good motion and feels comfortable with self MLD.   Rehab Potential Excellent    PT Frequency 2x / week    PT Duration 3 weeks    PT Treatment/Interventions ADLs/Self Care Home Management;Therapeutic exercise;Patient/family education;Manual techniques;Manual lymph drainage;Passive range of motion;Scar mobilization    PT Next Visit Plan self MLD, cont MFR to cording, PROM, scar massage    PT Home Exercise Plan Post op shoulder ROM HEP and closed chain flexion and abduction    Consulted and Agree with Plan of Care Patient           Patient will benefit from skilled therapeutic intervention in order to improve the following deficits and impairments:  Postural dysfunction,Decreased range of motion,Decreased knowledge of precautions,Impaired UE functional use,Pain,Increased edema,Decreased scar mobility,Increased fascial restricitons  Visit Diagnosis: Stiffness of left shoulder, not elsewhere classified  Localized edema  Aftercare following surgery for neoplasm  Abnormal posture  Malignant neoplasm of upper-outer quadrant of left breast in female, estrogen receptor positive ([place])     Problem List Patient Active Problem List   Diagnosis Date Noted  . Malignant neoplasm of upper-outer quadrant of left breast in female, estrogen receptor positive ([place]) 08/24/2020  . Estrogen deficiency 05/24/2019  . Vitamin D deficiency 08/03/2015  . Routine general medical examination at a health care facility 05/15/2015  . Dyspepsia 08/23/2014  . Abnormal ankle brachial index 08/23/2014  . Rheumatoid arthritis ([place]) 06/19/2014  . Thrombocytopenia ([place]) 06/19/2014  . Encounter for Medicare annual wellness exam 11/11/2013  . Hyperbilirubinemia 08/19/2013  . Hypothyroid 09/21/2012  . NEOPLASMS UNSPEC NATURE BONE SOFT TISSUE&SKIN 10/23/2010  . 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 OTHER SYMPTOMS REFERABLE JOINT SITE UNSPECIFIED  10/23/2010  . CARPAL TUNNEL SYNDROME, HX OF 04/05/2009  . Leukocytopenia 05/02/2008  . DRY MOUTH 04/28/2008  . INSOMNIA, TRANSIENT 04/27/2007  . Osteopenia 04/27/2007  . Hyperlipidemia 04/23/2007  . HIATAL HERNIA 04/23/2007  . POSTMENOPAUSAL STATUS 04/23/2007  . RHEUMATOID FACTOR, POSITIVE 04/23/2007    Elsie Ra [facility] 11/26/2020, 12:04 PM  [place], [place], [address] Phone: [phone]   Fax:  [phone]  Name: Jennifer Sellers MRN: [id number] Date of Birth: September 23, 1946 Cheral Almas, PT 11/26/20 12:07 PM

## 2020-11-27 ENCOUNTER — Ambulatory Visit
Admission: RE | Admit: 2020-11-27 | Discharge: 2020-11-27 | Disposition: A | Discharge status: Home / Self Care | Payer: Medicare Other | Source: Ambulatory Visit | Attending: Radiation Oncology | Admitting: Radiation Oncology

## 2020-11-27 DIAGNOSIS — C50412 Malignant neoplasm of upper-outer quadrant of left female breast: Secondary | ICD-10-CM | POA: Diagnosis not present

## 2020-11-28 ENCOUNTER — Ambulatory Visit: Payer: Medicare Other

## 2020-11-28 ENCOUNTER — Other Ambulatory Visit: Payer: Self-pay

## 2020-11-28 ENCOUNTER — Ambulatory Visit
Admission: RE | Admit: 2020-11-28 | Discharge: 2020-11-28 | Disposition: A | Discharge status: Home / Self Care | Payer: Medicare Other | Source: Ambulatory Visit | Attending: Radiation Oncology | Admitting: Radiation Oncology

## 2020-11-28 DIAGNOSIS — M25612 Stiffness of left shoulder, not elsewhere classified: Secondary | ICD-10-CM | POA: Diagnosis not present

## 2020-11-28 DIAGNOSIS — C50412 Malignant neoplasm of upper-outer quadrant of left female breast: Secondary | ICD-10-CM

## 2020-11-28 DIAGNOSIS — R600 Localized edema: Secondary | ICD-10-CM

## 2020-11-28 DIAGNOSIS — R293 Abnormal posture: Secondary | ICD-10-CM

## 2020-11-28 DIAGNOSIS — Z483 Aftercare following surgery for neoplasm: Secondary | ICD-10-CM

## 2020-11-28 NOTE — Therapy (Signed)
[place], [place], [address] Phone: [phone]   Fax:  [phone]  Physical Therapy Treatment  Patient Details  Name: Jennifer Sellers MRN: [id number] Date of Birth: 28-Jul-1946 Referring Provider (PT): Dr. Autumn Messing   Encounter Date: 11/28/2020   PT End of Session - 11/28/20 1201    Visit Number 10    Number of Visits 14    Date for PT Re-Evaluation 12/14/20    PT Start Time 1100    PT Stop Time 1155    PT Time Calculation (min) 55 min    Activity Tolerance Patient tolerated treatment well    Behavior During Therapy [facility] for tasks assessed/performed           Past Medical History:  Diagnosis Date  . Arthritis    rheumatoid arthritis  . Hypothyroidism   . Leukopenia   . Thyroid disease   . TIA (transient ischemic attack) 2011    Past Surgical History:  Procedure Laterality Date  . ABDOMINAL HYSTERECTOMY    . BREAST LUMPECTOMY WITH RADIOACTIVE SEED AND SENTINEL LYMPH NODE BIOPSY Left 10/05/2020   Procedure: LEFT BREAST LUMPECTOMY WITH RADIOACTIVE SEED AND SENTINEL LYMPH NODE BIOPSY;  Surgeon: Jovita Kussmaul, MD;  Location: [place];  Service: General;  Laterality: Left;  GENERAL AND PECTORAL BLOCK  . CARPAL TUNNEL RELEASE    . COLONOSCOPY    . COLONOSCOPY WITH PROPOFOL N/A 06/30/2018   Procedure: COLONOSCOPY WITH PROPOFOL;  Surgeon: Manya Silvas, MD;  Location: [facility] ENDOSCOPY;  Service: Endoscopy;  Laterality: N/A;  . colpo-ureteroscopy    . lens surgery      There were no vitals filed for this visit.   Subjective Assessment - 11/28/20 1101    Subjective Radiation went well this am and was on schedule. Last night my breast hurt alot when I took my bra off.  I did the MLD several times. shoulder and breast are fine right now.    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 Patient underwent a left  lumpectomy and sentinel node biopsy (3 negative nodes) on 10/05/2020. It is ER/PR positive and HER2 negative with a Ki67 of 10%.    Patient Stated Goals See if my arm is doing ok    Currently in Pain? Yes    Pain Score 0-No pain    Pain Orientation Left    Pain Onset 1 to 4 weeks ago    Multiple Pain Sites No                             [facility] Adult PT Treatment/Exercise - 11/28/20 0001      Shoulder Exercises: Supine   Other Supine Exercises stargazer x 5      Manual Therapy   Soft tissue mobilization to area of lumpectomy scar to soften area and decrease scar tissue    Myofascial Release To area of cording at left axilla and antecubital fossa    Manual Lymphatic Drainage (MLD) in supine : 5 diaphragmatic breaths, left axillary nodes, left inguinal nodes and establishment of axillo inguinal pathway, left breast moving fluid towards pathways then retracing all steps. Also did some MLD at left lateral upper arm, medial upper arm to lateral retracing all steps    Passive ROM PROM left shoulder flex and abduction  PT Bartz Term Goals - 11/26/20 1118      PT Neumann TERM GOAL #1   Title Patient will demonstrate she has regained full shoulder ROM and function post operatively compared to baselines.    Time 4    Period Weeks    Status Achieved      PT Conant TERM GOAL #2   Title Patient will report she is able to lay in the required postion for radiation without c/o increaed pain.    Time 4    Period Weeks    Status Achieved      PT Labriola TERM GOAL #3   Title Patient will improve her DASH score to be </= 13.64 for improved overall arm function and to achieve baseline score.    Period Weeks    Status On-going      PT Gopal TERM GOAL #4   Title Patient will improve left shoulder active flexion and abduction to be >/= 140 degrees for increased ease reaching.    Period Weeks    Status On-going      PT Holohan TERM GOAL #5   Title Patient  will verbalize good understanding of lymphedema risk reduction practices.    Baseline requires some cueing    Period Weeks    Status Achieved                 Plan - 11/28/20 1201    Clinical Impression Statement Pt continues with fibrosis under lumpectomy scar that does soften.  Mild cording is still present and pulled more with PROM today.  Stargazer stretch slightly tighter today. Pt has good tolerance for MFR techniques, but more discomfort with PROM today and needed VC's to relax for PROM    Stability/Clinical Decision Making Stable/Uncomplicated    Rehab Potential Excellent    PT Frequency 2x / week    PT Duration 3 weeks    PT Treatment/Interventions ADLs/Self Care Home Management;Therapeutic exercise;Patient/family education;Manual techniques;Manual lymph drainage;Passive range of motion;Scar mobilization    PT Next Visit Plan self MLD, cont MFR to cording, PROM, scar massage    PT Home Exercise Plan Post op shoulder ROM HEP and closed chain flexion and abduction    Consulted and Agree with Plan of Care Patient           Patient will benefit from skilled therapeutic intervention in order to improve the following deficits and impairments:  Postural dysfunction,Decreased range of motion,Decreased knowledge of precautions,Impaired UE functional use,Pain,Increased edema,Decreased scar mobility,Increased fascial restricitons  Visit Diagnosis: Stiffness of left shoulder, not elsewhere classified  Localized edema  Aftercare following surgery for neoplasm  Abnormal posture  Malignant neoplasm of upper-outer quadrant of left breast in female, estrogen receptor positive ([place])     Problem List Patient Active Problem List   Diagnosis Date Noted  . Malignant neoplasm of upper-outer quadrant of left breast in female, estrogen receptor positive ([place]) 08/24/2020  . Estrogen deficiency 05/24/2019  . Vitamin D deficiency 08/03/2015  . Routine general medical examination at a  health care facility 05/15/2015  . Dyspepsia 08/23/2014  . Abnormal ankle brachial index 08/23/2014  . Rheumatoid arthritis ([place]) 06/19/2014  . Thrombocytopenia ([place]) 06/19/2014  . Encounter for Medicare annual wellness exam 11/11/2013  . Hyperbilirubinemia 08/19/2013  . Hypothyroid 09/21/2012  . NEOPLASMS UNSPEC NATURE BONE SOFT TISSUE&SKIN 10/23/2010  . 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 OTHER SYMPTOMS REFERABLE JOINT SITE UNSPECIFIED 10/23/2010  . CARPAL TUNNEL SYNDROME, HX OF 04/05/2009  . Leukocytopenia 05/02/2008  . DRY  MOUTH 04/28/2008  . INSOMNIA, TRANSIENT 04/27/2007  . Osteopenia 04/27/2007  . Hyperlipidemia 04/23/2007  . HIATAL HERNIA 04/23/2007  . POSTMENOPAUSAL STATUS 04/23/2007  . RHEUMATOID FACTOR, POSITIVE 04/23/2007    Jennifer Sellers [facility] 11/28/2020, 12:05 PM  [place], [place], [address] Phone: [phone]   Fax:  [phone]  Name: DUNYA MEINERS MRN: [id number] Date of Birth: [birth year]  Cheral Almas, PT 11/28/20 12:05 PM

## 2020-11-29 ENCOUNTER — Encounter: Payer: Self-pay | Admitting: Radiation Oncology

## 2020-11-29 ENCOUNTER — Ambulatory Visit
Admission: RE | Admit: 2020-11-29 | Discharge: 2020-11-29 | Disposition: A | Discharge status: Home / Self Care | Payer: Medicare Other | Source: Ambulatory Visit | Attending: Radiation Oncology | Admitting: Radiation Oncology

## 2020-11-29 DIAGNOSIS — C50412 Malignant neoplasm of upper-outer quadrant of left female breast: Secondary | ICD-10-CM | POA: Diagnosis not present

## 2020-11-30 ENCOUNTER — Ambulatory Visit
Admission: RE | Admit: 2020-11-30 | Discharge: 2020-11-30 | Disposition: A | Discharge status: Home / Self Care | Payer: Medicare Other | Source: Ambulatory Visit | Attending: Radiation Oncology | Admitting: Radiation Oncology

## 2020-11-30 ENCOUNTER — Ambulatory Visit: Payer: Medicare Other | Admitting: Radiation Oncology

## 2020-11-30 DIAGNOSIS — C50412 Malignant neoplasm of upper-outer quadrant of left female breast: Secondary | ICD-10-CM | POA: Diagnosis not present

## 2020-11-30 NOTE — Progress Notes (Signed)
[place]   [phone] [phone]   Clinic Follow up Note   Patient Care Team: Tower, Wynelle Fanny, MD as PCP - General Lad, Caesar Bookman, MD as Referring Physician (Ophthalmology) Birder Robson, MD as Referring Physician (Ophthalmology) Worthy Keeler, DC as Consulting Physician (Chiropractic Medicine) Mauro Kaufmann, RN as Oncology Nurse Navigator Rockwell Germany, RN as Oncology Nurse Navigator Jovita Kussmaul, MD as Consulting Physician (General Surgery) Truitt Merle, MD as Consulting Physician (Hematology) Kyung Rudd, MD as Consulting Physician (Radiation Oncology)  Date of Service:  12/05/2020  CHIEF COMPLAINT: F/u of left breast cancer   SUMMARY OF ONCOLOGIC HISTORY: Oncology History Overview Note  Cancer Staging Malignant neoplasm of upper-outer quadrant of left breast in female, estrogen receptor positive ([place]) Staging form: Breast, AJCC 8th Edition - Clinical stage from 08/29/2020: Stage IA (cT1c, cN0, cM0, G1, ER+, PR+, HER2-) - Unsigned - Pathologic stage from 10/31/2020: Stage IA (pT2, pN0(sn), cM0, G1, ER+, PR+, HER2-, Oncotype DX score: 12) - Unsigned    Malignant neoplasm of upper-outer quadrant of left breast in female, estrogen receptor positive ([place])  08/02/2020 Mammogram   IMPRESSION The 1cm (0.9x1.2x0.9cm on US)asymmetry in the left breast at 1:00 position, 4 cmfn is indeterminate   08/22/2020 Initial Biopsy   Diagnosis 08/22/20 Breast, left, needle core biopsy, 1 o'clock, 4cmfn -INVASIVE MAMMARY CARCINOMA -SEE COMMENT       -Grade 1 or 3  E-cadherin is NEGATIVE supporting lobular origin   08/22/2020 Receptors her2   ER - 95%  PR - 25%  Ki67 - 10% HER2 Negative on [facility]   08/24/2020 Initial Diagnosis   Malignant neoplasm of upper-outer quadrant of left breast in female, estrogen receptor positive ([place])   08/29/2020 Breast MRI   IMPRESSION: 1. Enhancing mass in the upper slightly outer left breast  measuring approximately 2.4 x 1.6 x 1.8 cm, consistent with the patient's known malignancy. 2. No MRI evidence of malignancy in the right breast. 3. No suspicious lymphadenopathy.   10/05/2020 Surgery   LEFT BREAST LUMPECTOMY WITH RADIOACTIVE SEED AND SENTINEL LYMPH NODE BIOPSY by Dr Marlou Starks    10/05/2020 Pathology Results   FINAL MICROSCOPIC DIAGNOSIS:   A. LYMPH NODE, LEFT AXILLARY #1, SENTINEL, EXCISION:  -  No carcinoma identified in one lymph node (0/1)  -  See comment   B. LYMPH NODE, LEFT AXILLARY, SENTINEL, EXCISION:  -  No carcinoma identified in one lymph node (0/1)  -  See comment   C. LYMPH NODE, LEFT AXILLARY #2, SENTINEL, EXCISION:  -  No carcinoma identified in one lymph node (0/1)  -  See comment   D. BREAST, LEFT, LUMPECTOMY:  -  Invasive lobular carcinoma, Nottingham grade 1 of 3, 2.3 cm  -  Margins uninvolved by carcinoma (<0.1 cm; superior margin)  -  Previous biopsy site changes present  -  See oncology table and comment below    10/05/2020 Oncotype testing   Oncoytpe  Recurrence Score 12 with distant recurrence risk at 9 years with AI or Tamoxifen at 3%  There is less than 1% benefit of chemotherapy.    11/19/2020 - 12/14/2020 Radiation Therapy   Adjuvant Radiation with Dr Lisbeth Renshaw    12/2020 -  Anti-estrogen oral therapy   Tamoxifen 72m daily starting 12/2020       CURRENT THERAPY:  Adjuvant Radiation with Dr MLisbeth Renshaw1/3/22-1/28/22  Tamoxifen 252mdaily starting 12/2020   INTERVAL HISTORY:  Jennifer Sellers here for a follow up of  left breast cancer. She presents to the clinic with her daughter. She notes Radiation is going well. She notes having some red spots across her left chest this morning. She also notes fatigue with RT. She denies any significant skin changes. She denies itching or pain from RT. She notes skin itching of her arms, hands and legs occasionally.She notes her skin has been dry.  She notes her surgery went well and recovered well. She  notes she has had 2-3 aunts who has had breast cancer. She notes she rather not use Mychart and rather her information be a hard copy including this visit. She plans to see Dr Marlou Starks in 4 months.    REVIEW OF SYSTEMS:   Constitutional: Denies fevers, chills or abnormal weight loss (+) Fatigue  Eyes: Denies blurriness of vision Ears, nose, mouth, throat, and face: Denies mucositis or sore throat Respiratory: Denies cough, dyspnea or wheezes Cardiovascular: Denies palpitation, chest discomfort or lower extremity swelling Gastrointestinal:  Denies nausea, heartburn or change in bowel habits Skin: Denies abnormal skin rashes  Lymphatics: Denies new lymphadenopathy or easy bruising Neurological:Denies numbness, tingling or new weaknesses Behavioral/Psych: Mood is stable, no new changes  All other systems were reviewed with the patient and are negative.  MEDICAL HISTORY:  Past Medical History:  Diagnosis Date  . Arthritis    rheumatoid arthritis  . Hypothyroidism   . Leukopenia   . Thyroid disease   . TIA (transient ischemic attack) 2011    SURGICAL HISTORY: Past Surgical History:  Procedure Laterality Date  . ABDOMINAL HYSTERECTOMY    . BREAST LUMPECTOMY WITH RADIOACTIVE SEED AND SENTINEL LYMPH NODE BIOPSY Left 10/05/2020   Procedure: LEFT BREAST LUMPECTOMY WITH RADIOACTIVE SEED AND SENTINEL LYMPH NODE BIOPSY;  Surgeon: Jovita Kussmaul, MD;  Location: [place];  Service: General;  Laterality: Left;  GENERAL AND PECTORAL BLOCK  . CARPAL TUNNEL RELEASE    . COLONOSCOPY    . COLONOSCOPY WITH PROPOFOL N/A 06/30/2018   Procedure: COLONOSCOPY WITH PROPOFOL;  Surgeon: Manya Silvas, MD;  Location: [facility] ENDOSCOPY;  Service: Endoscopy;  Laterality: N/A;  . colpo-ureteroscopy    . lens surgery      I have reviewed the social history and family history with the patient and they are unchanged from previous note.  ALLERGIES:  is allergic to cipro [ciprofloxacin hcl],  cortisporin [bacitra-neomycin-polymyxin-hc], prevnar [pneumococcal 13-val conj vacc], and sulfonamide derivatives.  MEDICATIONS:  Current Outpatient Medications  Medication Sig Dispense Refill  . tamoxifen (NOLVADEX) 20 MG tablet Take 1 tablet (20 mg total) by mouth daily. 30 tablet 2  . Cholecalciferol (OPTIMAL-D PO) Take 1 tablet by mouth daily.    . Digestive Aids Mixture (DIGESTION GB) CAPS Take 1 capsule by mouth daily.    Marland Kitchen HYDROcodone-acetaminophen (NORCO/VICODIN) 5-325 MG tablet Take 1-2 tablets by mouth every 6 (six) hours as needed for moderate pain or severe pain. 15 tablet 0  . levothyroxine (SYNTHROID) 50 MCG tablet TAKE 1 TABLET EVERY DAY AND 2 ON SUNDAY 104 tablet 1  . Multiple Vitamins-Minerals (PRESERVISION AREDS 2 PO) Take 2 capsules by mouth daily.     . NON FORMULARY 2 drops daily. sustane    . NON FORMULARY Take 1 tablet by mouth daily. Dietary supplement    . OVER THE COUNTER MEDICATION Take 1 tablet by mouth daily. opti-methyl-b    . OVER THE COUNTER MEDICATION Osteo-mins PM with D+K1+K2    . OVER THE COUNTER MEDICATION Osteo mins pm calcium    .  OVER THE COUNTER MEDICATION Thore Pic Mins    . OVER THE COUNTER MEDICATION Optimal longevi D K2    . OVER THE COUNTER MEDICATION Liqua A     No current facility-administered medications for this visit.    PHYSICAL EXAMINATION: ECOG PERFORMANCE STATUS: 0 - Asymptomatic  Vitals:   12/05/20 0850  BP: 118/81  Pulse: 79  Resp: 17  Temp: 98.1 F (36.7 C)  SpO2: 100%   Filed Weights   12/05/20 0850  Weight: 137 lb 14.4 oz (62.6 kg)    GENERAL:alert, no distress and comfortable SKIN: skin color, texture, turgor are normal, no rashes or significant lesions EYES: normal, Conjunctiva are pink and non-injected, sclera clear  NECK: supple, thyroid normal size, non-tender, without nodularity LYMPH:  no palpable lymphadenopathy in the cervical, axillary  LUNGS: clear to auscultation and percussion with normal breathing  effort HEART: regular rate & rhythm and no murmurs and no lower extremity edema ABDOMEN:abdomen soft, non-tender and normal bowel sounds Musculoskeletal:no cyanosis of digits and no clubbing  NEURO: alert & oriented x 3 with fluent speech, no focal motor/sensory deficits BREAST: S/p left lumpectomy: Surgical incision healed well (+) Mild skin erythema of left breast from RT No palpable mass, nodules or adenopathy bilaterally. Breast exam benign.   LABORATORY DATA:  I have reviewed the data as listed CBC Latest Ref Rng & Units 08/29/2020 05/23/2020 05/09/2019  WBC 4.0 - 10.5 K/uL 3.1(L) 2.3(L) 3.0(L)  Hemoglobin 12.0 - 15.0 g/dL 12.0 12.3 12.8  Hematocrit 36.0 - 46.0 % 35.0(L) 35.6(L) 37.6  Platelets 150 - 400 K/uL 151 160.0 166.0     CMP Latest Ref Rng & Units 08/29/2020 05/23/2020 05/09/2019  Glucose 70 - 99 mg/dL 86 97 86  BUN 8 - 23 mg/dL _0 Creatinine 0.44 - 1.00 mg/dL 0.93 0.88 0.91  Sodium 135 - 145 mmol/L 142 141 141  Potassium 3.5 - 5.1 mmol/L 4.0 4.7 4.3  Chloride 98 - 111 mmol/L 106 106 106  CO2 22 - 32 mmol/L 32 30 30  Calcium 8.9 - 10.3 mg/dL 8.9 8.8 8.8  Total Protein 6.5 - 8.1 g/dL 6.6 6.3 6.3  Total Bilirubin 0.3 - 1.2 mg/dL 2.1(H) 1.6(H) 1.6(H)  Alkaline Phos 38 - 126 U/L 79 73 76  AST 15 - 41 U/L _1 ALT 0 - 44 U/L _2 RADIOGRAPHIC STUDIES: I have personally reviewed the radiological images as listed and agreed with the findings in the report. No results found.   ASSESSMENT & PLAN:  Jennifer Sellers is a 75 y.o. female with    1. Malignant neoplasm of upper-outer quadrant of left breast, invasive lobular carcinoma stage IA, pT2N0M0, ER+/PR+/HER2-, Grade , RS 12 -She was diagnosed in 08/2020 with a 1.2cm invasive lobular carcinoma, grade I.  -She underwent left lumpectomy with SLNB by Dr Marlou Starks on 10/05/20. Her surgical pathology showed 2.3 cm of grade I lobular carcinoma was complete resected, clean margins, node negative. I reviewed with  patient today.  -Her Oncotype showed low risk with RS 12 with distant recurrence risk at 9 years with AI or Tamoxifen at 3%. There is less than 1% benefit of chemotherapy, so I did not recommend adjuvant chemo. I reviewed with patient today.  -To reduce her risk of local recurrence she started adjuvant radiation with Dr Lisbeth Renshaw on 11/19/20. She plans to complete on 12/14/20. She is tolerating well with fatigue and mild skin changes.  -Given the  strong ER and PR expression in her postmenopausal status, I recommend adjuvant endocrine therapy with aromatase inhibitor with Anastrozole, Exemestane or Tamoxifen for a total of 5-10 years to reduce the risk of cancer recurrence. Given her lobular histology, and the risk of late recurrence, I recommend 10 years of therapy if she is able to tolerate well. Due to her osteopenia and RA, I recommend tamoxifen   ----The potential side effects, which includes but not limited to, hot flash, skin and vaginal dryness, slightly increased risk of cardiovascular disease and cataract, small risk of thrombosis and endometrial cancer, were discussed with her in great details. Preventive strategies for thrombosis, such as being physically active, using compression stocks, avoid cigarette smoking, etc., were reviewed with her. She has had hysterectomy so no risk for endometrial cancer. She voiced good understanding, and agrees to proceed. Will start after she completes adjuvant breast radiation.  -I encouraged her to have healthy diet and exercise regularly.  -We also discussed the breast cancer surveillance after her surgery. She will continue annual screening mammogram, self exams, and a routine office visit with lab and exam with [place].  -Survivorship clinic in 3 months with NP Lacie and F/u with me in 6 months. If she has any concerns or questions she can contact our clinic sooner.    2. Comorbidities: Rheumatoid arthritis, Hypothyroidism -She does not f/u with Rheumatologist given  mild symptoms. She does not require pain medication -On thyroid replacement.   3. Osteopenia -Her 06/2019 DEXA showed osteopenia in left hip with T-score -1.6 and 2.8% risk of hip frax. She continues to monitor at [facility] every 2 years.  -I discussed on AI this can weaken further and with Tamoxifen it can strengthen her bone. She plans to proceed with Tamoxifen first.   4. Genetics  -Patient notes 2-3 aunts with h/o breast cancer. I discussed she is eligible for genetic testing. She will consider but her daughter feels she does not need to proceed as they are on top of breast cancer screenings.    PLAN:  -Continue RT until 12/14/20  -I called in Tamoxifen 48m daily to start in 12/2020  -Survivorship clinic with NP Lacie in 3 months (virtual) -Lab and F/u with me in 6 months  -Will mail patient her visit note for her record    No problem-specific Assessment & Plan notes found for this encounter.   No orders of the defined types were placed in this encounter.  All questions were answered. The patient knows to call the clinic with any problems, questions or concerns. No barriers to learning was detected. The total time spent in the appointment was 30 minutes.     YTruitt Merle MD 12/05/2020   I, AJoslyn Devon am acting as scribe for YTruitt Merle MD.   I have reviewed the above documentation for accuracy and completeness, and I agree with the above.

## 2020-12-02 DIAGNOSIS — C50412 Malignant neoplasm of upper-outer quadrant of left female breast: Secondary | ICD-10-CM | POA: Diagnosis not present

## 2020-12-02 NOTE — Progress Notes (Signed)
  Radiation Oncology         [phone] ________________________________  Name: Jennifer Sellers MRN: [id number]  Date: 11/29/2020  DOB: 1945/11/30  SIMULATION NOTE   NARRATIVE:  The patient underwent simulation today for ongoing radiation therapy.  The existing CT study set was employed for the purpose of virtual treatment planning.  The target and avoidance structures were reviewed and modified as necessary.  Treatment planning then occurred.  The radiation boost prescription was entered and confirmed.  A total of 3 complex treatment devices were fabricated in the form of multi-leaf collimators to shape radiation around the targets while maximally excluding nearby normal structures. I have requested : Isodose Plan.    PLAN:  This modified radiation beam arrangement is intended to continue the current radiation dose to an additional 8 Gy in 4 fractions for a total cumulative dose of 50.56 Gy.    ------------------------------------------------  [name], MD, PhD

## 2020-12-03 ENCOUNTER — Ambulatory Visit
Admission: RE | Admit: 2020-12-03 | Discharge: 2020-12-03 | Disposition: A | Discharge status: Home / Self Care | Payer: Medicare Other | Source: Ambulatory Visit | Attending: Radiation Oncology | Admitting: Radiation Oncology

## 2020-12-03 ENCOUNTER — Other Ambulatory Visit: Payer: Self-pay

## 2020-12-03 DIAGNOSIS — C50412 Malignant neoplasm of upper-outer quadrant of left female breast: Secondary | ICD-10-CM | POA: Diagnosis not present

## 2020-12-04 ENCOUNTER — Ambulatory Visit
Admission: RE | Admit: 2020-12-04 | Discharge: 2020-12-04 | Disposition: A | Discharge status: Home / Self Care | Payer: Medicare Other | Source: Ambulatory Visit | Attending: Radiation Oncology | Admitting: Radiation Oncology

## 2020-12-04 ENCOUNTER — Other Ambulatory Visit: Payer: Self-pay

## 2020-12-04 DIAGNOSIS — C50412 Malignant neoplasm of upper-outer quadrant of left female breast: Secondary | ICD-10-CM | POA: Diagnosis not present

## 2020-12-05 ENCOUNTER — Inpatient Hospital Stay: Payer: Medicare Other | Attending: Hematology | Admitting: Hematology

## 2020-12-05 ENCOUNTER — Ambulatory Visit
Admission: RE | Admit: 2020-12-05 | Discharge: 2020-12-05 | Disposition: A | Discharge status: Home / Self Care | Payer: Medicare Other | Source: Ambulatory Visit | Attending: Radiation Oncology | Admitting: Radiation Oncology

## 2020-12-05 ENCOUNTER — Other Ambulatory Visit: Payer: Self-pay

## 2020-12-05 ENCOUNTER — Ambulatory Visit: Payer: Medicare Other

## 2020-12-05 ENCOUNTER — Encounter: Payer: Self-pay | Admitting: Hematology

## 2020-12-05 VITALS — BP 118/81 | HR 79 | Temp 98.1°F | Resp 17 | Ht 67.0 in | Wt 137.9 lb

## 2020-12-05 DIAGNOSIS — Z923 Personal history of irradiation: Secondary | ICD-10-CM | POA: Insufficient documentation

## 2020-12-05 DIAGNOSIS — Z79899 Other long term (current) drug therapy: Secondary | ICD-10-CM | POA: Insufficient documentation

## 2020-12-05 DIAGNOSIS — R5383 Other fatigue: Secondary | ICD-10-CM | POA: Diagnosis not present

## 2020-12-05 DIAGNOSIS — E039 Hypothyroidism, unspecified: Secondary | ICD-10-CM | POA: Diagnosis not present

## 2020-12-05 DIAGNOSIS — Z170 Estrogen receptor positive status [ER+]: Secondary | ICD-10-CM

## 2020-12-05 DIAGNOSIS — Z79810 Long term (current) use of selective estrogen receptor modulators (SERMs): Secondary | ICD-10-CM | POA: Diagnosis not present

## 2020-12-05 DIAGNOSIS — M069 Rheumatoid arthritis, unspecified: Secondary | ICD-10-CM | POA: Insufficient documentation

## 2020-12-05 DIAGNOSIS — M85852 Other specified disorders of bone density and structure, left thigh: Secondary | ICD-10-CM | POA: Diagnosis not present

## 2020-12-05 DIAGNOSIS — Z803 Family history of malignant neoplasm of breast: Secondary | ICD-10-CM | POA: Insufficient documentation

## 2020-12-05 DIAGNOSIS — Z8673 Personal history of transient ischemic attack (TIA), and cerebral infarction without residual deficits: Secondary | ICD-10-CM | POA: Diagnosis not present

## 2020-12-05 DIAGNOSIS — Z483 Aftercare following surgery for neoplasm: Secondary | ICD-10-CM

## 2020-12-05 DIAGNOSIS — M25612 Stiffness of left shoulder, not elsewhere classified: Secondary | ICD-10-CM

## 2020-12-05 DIAGNOSIS — R293 Abnormal posture: Secondary | ICD-10-CM

## 2020-12-05 DIAGNOSIS — C50412 Malignant neoplasm of upper-outer quadrant of left female breast: Secondary | ICD-10-CM

## 2020-12-05 DIAGNOSIS — R600 Localized edema: Secondary | ICD-10-CM

## 2020-12-05 MED ORDER — TAMOXIFEN CITRATE 20 MG PO TABS: 20.0000 mg | ORAL_TABLET | Freq: Every day | ORAL | 2 refills | Status: DC

## 2020-12-05 NOTE — Therapy (Signed)
[place], [place], [address] Phone: [phone]   Fax:  [phone]  Physical Therapy Treatment  Patient Details  Name: Jennifer Sellers MRN: [id number] Date of Birth: 1946/03/07 Referring Provider (PT): Dr. Autumn Messing   Encounter Date: 12/05/2020   PT End of Session - 12/05/20 1135    Visit Number 11    Number of Visits 14    Date for PT Re-Evaluation 12/14/20    PT Start Time 1027    PT Stop Time 1121    PT Time Calculation (min) 58 min    Activity Tolerance Patient tolerated treatment well    Behavior During Therapy [facility] for tasks assessed/performed           Past Medical History:  Diagnosis Date  . Arthritis    rheumatoid arthritis  . Hypothyroidism   . Leukopenia   . Thyroid disease   . TIA (transient ischemic attack) 2011    Past Surgical History:  Procedure Laterality Date  . ABDOMINAL HYSTERECTOMY    . BREAST LUMPECTOMY WITH RADIOACTIVE SEED AND SENTINEL LYMPH NODE BIOPSY Left 10/05/2020   Procedure: LEFT BREAST LUMPECTOMY WITH RADIOACTIVE SEED AND SENTINEL LYMPH NODE BIOPSY;  Surgeon: Jovita Kussmaul, MD;  Location: [place];  Service: General;  Laterality: Left;  GENERAL AND PECTORAL BLOCK  . CARPAL TUNNEL RELEASE    . COLONOSCOPY    . COLONOSCOPY WITH PROPOFOL N/A 06/30/2018   Procedure: COLONOSCOPY WITH PROPOFOL;  Surgeon: Manya Silvas, MD;  Location: [facility] ENDOSCOPY;  Service: Endoscopy;  Laterality: N/A;  . colpo-ureteroscopy    . lens surgery      There were no vitals filed for this visit.   Subjective Assessment - 12/05/20 1023    Subjective My daughter is driving me today.  Saw the oncologist and I have to get on the pills right away. The PT has meant so much to me.  I'm glad that it was available.    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 Patient underwent a left lumpectomy and  sentinel node biopsy (3 negative nodes) on 10/05/2020. It is ER/PR positive and HER2 negative with a Ki67 of 10%.    Patient Stated Goals See if my arm is doing ok    Currently in Pain? No/denies    Pain Score 0-No pain    Pain Onset 1 to 4 weeks ago                             [facility] Adult PT Treatment/Exercise - 12/05/20 0001      Shoulder Exercises: Supine   Other Supine Exercises AA shoulder flex supine x 5    Other Supine Exercises stargazer x 5      Manual Therapy   Soft tissue mobilization to area of lumpectomy scar to soften area and decrease scar tissue    Myofascial Release To area of cording at left axilla and antecubital fossa    Manual Lymphatic Drainage (MLD) in supine : short neck, 5 diaphragmatic breaths, left axillary nodes, left inguinal nodes and establishment of axillo inguinal pathway, left breast moving fluid towards pathways then retracing all steps. Had pt practice all steps with occasional VC's required    Passive ROM PROM left shoulder flex and abduction  PT Education - 12/05/20 1134    Education Details Reviewed MLD again with pt.  She still requires occasional VC's for proper technique but is improved and is performing at home.    Person(s) Educated Patient    Methods Explanation;Demonstration    Comprehension Returned demonstration;Tactile cues required;Verbal cues required;Need further instruction               PT Scerbo Term Goals - 11/26/20 1118      PT Tavella TERM GOAL #1   Title Patient will demonstrate she has regained full shoulder ROM and function post operatively compared to baselines.    Time 4    Period Weeks    Status Achieved      PT Cardy TERM GOAL #2   Title Patient will report she is able to lay in the required postion for radiation without c/o increaed pain.    Time 4    Period Weeks    Status Achieved      PT Honse TERM GOAL #3   Title Patient will improve her DASH score to be </= 13.64  for improved overall arm function and to achieve baseline score.    Period Weeks    Status On-going      PT Teters TERM GOAL #4   Title Patient will improve left shoulder active flexion and abduction to be >/= 140 degrees for increased ease reaching.    Period Weeks    Status On-going      PT Garrido TERM GOAL #5   Title Patient will verbalize good understanding of lymphedema risk reduction practices.    Baseline requires some cueing    Period Weeks    Status Achieved                 Plan - 12/05/20 1136    Clinical Impression Statement pt continues to have mild cording present which does not seem to bother her too much except with tightness with end range PROM.  She is compliant with Self MLD to the left breast at home, but does still require some VC's and tactile cues to perform correctly.  There is still a slight amount of fibrosis under incision.  She will be fit for prophylactic sleeve and a compression bra by Melissa on Friday.    Stability/Clinical Decision Making Stable/Uncomplicated    Rehab Potential Excellent    PT Frequency 2x / week    PT Duration 3 weeks    PT Treatment/Interventions ADLs/Self Care Home Management;Therapeutic exercise;Patient/family education;Manual techniques;Manual lymph drainage;Passive range of motion;Scar mobilization    PT Next Visit Plan Have Melissa measure for sleeve/bra, self MLD, cont MFR to cording, PROM, scar massage    Consulted and Agree with Plan of Care Patient           Patient will benefit from skilled therapeutic intervention in order to improve the following deficits and impairments:  Postural dysfunction,Decreased range of motion,Decreased knowledge of precautions,Impaired UE functional use,Pain,Increased edema,Decreased scar mobility,Increased fascial restricitons  Visit Diagnosis: Stiffness of left shoulder, not elsewhere classified  Localized edema  Aftercare following surgery for neoplasm  Abnormal posture  Malignant  neoplasm of upper-outer quadrant of left breast in female, estrogen receptor positive (HCC)     Problem List Patient Active Problem List   Diagnosis Date Noted  . Malignant neoplasm of upper-outer quadrant of left breast in female, 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  . Estrogen deficiency 05/24/2019  . Vitamin D deficiency 08/03/2015  . Routine general medical examination  at a health care facility 05/15/2015  . Dyspepsia 08/23/2014  . Abnormal ankle brachial index 08/23/2014  . Rheumatoid arthritis ([place]) 06/19/2014  . Thrombocytopenia ([place]) 06/19/2014  . Encounter for Medicare annual wellness exam 11/11/2013  . Hyperbilirubinemia 08/19/2013  . Hypothyroid 09/21/2012  . NEOPLASMS UNSPEC NATURE BONE SOFT TISSUE&SKIN 10/23/2010  . 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 OTHER SYMPTOMS REFERABLE JOINT SITE UNSPECIFIED 10/23/2010  . CARPAL TUNNEL SYNDROME, HX OF 04/05/2009  . Leukocytopenia 05/02/2008  . DRY MOUTH 04/28/2008  . INSOMNIA, TRANSIENT 04/27/2007  . Osteopenia 04/27/2007  . Hyperlipidemia 04/23/2007  . HIATAL HERNIA 04/23/2007  . POSTMENOPAUSAL STATUS 04/23/2007  . RHEUMATOID FACTOR, POSITIVE 04/23/2007    Elsie Ra [facility] 12/05/2020, 11:40 AM  [place], [place], [address] Phone: [phone]   Fax:  [phone]  Name: Jennifer Sellers MRN: [id number] Date of Birth: April 30, 1946  Cheral Almas, PT 12/05/20 11:41 AM

## 2020-12-06 ENCOUNTER — Other Ambulatory Visit: Payer: Self-pay

## 2020-12-06 ENCOUNTER — Ambulatory Visit: Payer: Medicare Other

## 2020-12-06 ENCOUNTER — Ambulatory Visit
Admission: RE | Admit: 2020-12-06 | Discharge: 2020-12-06 | Disposition: A | Discharge status: Home / Self Care | Payer: Medicare Other | Source: Ambulatory Visit | Attending: Radiation Oncology | Admitting: Radiation Oncology

## 2020-12-06 DIAGNOSIS — C50412 Malignant neoplasm of upper-outer quadrant of left female breast: Secondary | ICD-10-CM | POA: Diagnosis not present

## 2020-12-07 ENCOUNTER — Other Ambulatory Visit: Payer: Self-pay

## 2020-12-07 ENCOUNTER — Ambulatory Visit: Payer: Medicare Other

## 2020-12-07 ENCOUNTER — Ambulatory Visit
Admission: RE | Admit: 2020-12-07 | Discharge: 2020-12-07 | Disposition: A | Discharge status: Home / Self Care | Payer: Medicare Other | Source: Ambulatory Visit | Attending: Radiation Oncology | Admitting: Radiation Oncology

## 2020-12-07 DIAGNOSIS — Z483 Aftercare following surgery for neoplasm: Secondary | ICD-10-CM

## 2020-12-07 DIAGNOSIS — Z170 Estrogen receptor positive status [ER+]: Secondary | ICD-10-CM

## 2020-12-07 DIAGNOSIS — R293 Abnormal posture: Secondary | ICD-10-CM

## 2020-12-07 DIAGNOSIS — M25612 Stiffness of left shoulder, not elsewhere classified: Secondary | ICD-10-CM

## 2020-12-07 DIAGNOSIS — C50412 Malignant neoplasm of upper-outer quadrant of left female breast: Secondary | ICD-10-CM | POA: Diagnosis not present

## 2020-12-07 DIAGNOSIS — R600 Localized edema: Secondary | ICD-10-CM

## 2020-12-07 NOTE — Therapy (Signed)
[facility] [address] [place], [place], [address] Phone: [phone]   Fax:  [phone]  Physical Therapy Treatment  Patient Details  Name: Jennifer Sellers MRN: [id number] Date of Birth: 02-Dec-1945 Referring Provider (PT): Dr. Chevis Pretty   Encounter Date: 12/07/2020   PT End of Session - 12/07/20 1210    Visit Number 12    Number of Visits 14    Date for PT Re-Evaluation 12/14/20    PT Start Time 1117    PT Stop Time 1159    PT Time Calculation (min) 42 min    Activity Tolerance Patient tolerated treatment well    Behavior During Therapy [facility] for tasks assessed/performed           Past Medical History:  Diagnosis Date   Arthritis    rheumatoid arthritis   Hypothyroidism    Leukopenia    Thyroid disease    TIA (transient ischemic attack) 2011    Past Surgical History:  Procedure Laterality Date   ABDOMINAL HYSTERECTOMY     BREAST LUMPECTOMY WITH RADIOACTIVE SEED AND SENTINEL LYMPH NODE BIOPSY Left 10/05/2020   Procedure: LEFT BREAST LUMPECTOMY WITH RADIOACTIVE SEED AND SENTINEL LYMPH NODE BIOPSY;  Surgeon: Griselda Miner, MD;  Location: [facility];  Service: General;  Laterality: Left;  GENERAL AND PECTORAL BLOCK   CARPAL TUNNEL RELEASE     COLONOSCOPY     COLONOSCOPY WITH PROPOFOL N/A 06/30/2018   Procedure: COLONOSCOPY WITH PROPOFOL;  Surgeon: Scot Jun, MD;  Location: [facility] ENDOSCOPY;  Service: Endoscopy;  Laterality: N/A;   colpo-ureteroscopy     lens surgery      There were no vitals filed for this visit.   Subjective Assessment - 12/07/20 1112    Subjective Pt. forgot to come early for measuring by Melissa so treatment started late.  Pt reports some discomfort in posterior upper arm today for unknown reasons, but notes decreased shooting pains in breast.  Sleeve and bra should come in a week from Monday per Melissa if [facility] approves quickly.    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 Patient underwent a left lumpectomy and sentinel node biopsy (3 negative nodes) on 10/05/2020. It is ER/PR positive and HER2 negative with a Ki67 of 10%.    Patient Stated Goals See if my arm is doing ok    Currently in Pain? No/denies                             [facility] Adult PT Treatment/Exercise - 12/07/20 0001      Shoulder Exercises: Supine   Other Supine Exercises AA shoulder flex supine x 5    Other Supine Exercises stargazer x 5      Manual Therapy   Soft tissue mobilization to area of lumpectomy scar to soften area and decrease scar tissue    Myofascial Release To area of cording at left axilla and antecubital fossa    Manual Lymphatic Drainage (MLD) in supine : short neck, 5 diaphragmatic breaths, left axillary nodes, left inguinal nodes and establishment of axillo inguinal pathway, left breast moving fluid towards pathways then retracing all steps. Added upper ar    Passive ROM PROM left shoulder flex and abduction                       PT Radin Term  Goals - 11/26/20 1118      PT Sandeen TERM GOAL #1   Title Patient will demonstrate she has regained full shoulder ROM and function post operatively compared to baselines.    Time 4    Period Weeks    Status Achieved      PT Girdler TERM GOAL #2   Title Patient will report she is able to lay in the required postion for radiation without c/o increaed pain.    Time 4    Period Weeks    Status Achieved      PT Fok TERM GOAL #3   Title Patient will improve her DASH score to be </= 13.64 for improved overall arm function and to achieve baseline score.    Period Weeks    Status On-going      PT Bobby TERM GOAL #4   Title Patient will improve left shoulder active flexion and abduction to be >/= 140 degrees for increased ease reaching.    Period Weeks    Status On-going      PT Biddle TERM GOAL #5   Title Patient will  verbalize good understanding of lymphedema risk reduction practices.    Baseline requires some cueing    Period Weeks    Status Achieved                 Plan - 12/07/20 1216    Clinical Impression Statement Treatment started late today secondary to being measured by Melissa for sleeve and compression braNew cord noted today at posterior lateral arm running to elbow which is uncomfortable today.  slighlty more limited with abd today secondary to cording at axilla.  Slightly more redness noted under her arm from radiatioon.  She has 1 more week of radiation/    Stability/Clinical Decision Making Stable/Uncomplicated    Rehab Potential Excellent    PT Frequency 2x / week    PT Duration 3 weeks    PT Treatment/Interventions ADLs/Self Care Home Management;Therapeutic exercise;Patient/family education;Manual techniques;Manual lymph drainage;Passive range of motion;Scar mobilization    PT Next Visit Plan cont self MLD, MRF to cording at axilla and lateral arm, PROM, scar massage, Scap retraction with TB .  Likely DC end of next week or schedule 1 for F/U to check bra/sleeve   PT Home Exercise Plan Post op shoulder ROM HEP and closed chain flexion and abduction    Consulted and Agree with Plan of Care Patient           Patient will benefit from skilled therapeutic intervention in order to improve the following deficits and impairments:  Postural dysfunction,Decreased range of motion,Decreased knowledge of precautions,Impaired UE functional use,Pain,Increased edema,Decreased scar mobility,Increased fascial restricitons  Visit Diagnosis: Stiffness of left shoulder, not elsewhere classified  Localized edema  Aftercare following surgery for neoplasm  Abnormal posture  Malignant neoplasm of upper-outer quadrant of left breast in female, estrogen receptor positive (HCC)     Problem List Patient Active Problem List   Diagnosis Date Noted   Malignant neoplasm of upper-outer quadrant  of left breast in female, 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   Estrogen deficiency 05/24/2019   Vitamin D deficiency 08/03/2015   Routine general medical examination at a health care facility 05/15/2015   Dyspepsia 08/23/2014   Abnormal ankle brachial index 08/23/2014   Rheumatoid arthritis (HCC) 06/19/2014   Thrombocytopenia (HCC) 06/19/2014   Encounter for Medicare annual wellness exam 11/11/2013   Hyperbilirubinemia 08/19/2013   Hypothyroid 09/21/2012   NEOPLASMS UNSPEC NATURE BONE  SOFT TISSUE&SKIN 10/23/2010   GOUTY TOPHI 10/23/2010   OTHER SYMPTOMS REFERABLE JOINT SITE UNSPECIFIED 10/23/2010   CARPAL TUNNEL SYNDROME, HX OF 04/05/2009   Leukocytopenia 05/02/2008   DRY MOUTH 04/28/2008   INSOMNIA, TRANSIENT 04/27/2007   Osteopenia 04/27/2007   Hyperlipidemia 04/23/2007   HIATAL HERNIA 04/23/2007   POSTMENOPAUSAL STATUS 04/23/2007   RHEUMATOID FACTOR, POSITIVE 04/23/2007    Elsie Ra Z[facility] Levesque 12/07/2020, 12:23 PM  [place], [place], [address] Phone: [phone]   Fax:  [phone]  Name: ITZELLE GAINS MRN: [id number] Date of Birth: 08-Jun-1946  Cheral Almas, PT 12/07/20 12:25 PM

## 2020-12-10 ENCOUNTER — Other Ambulatory Visit: Payer: Self-pay

## 2020-12-10 ENCOUNTER — Ambulatory Visit: Payer: Medicare Other

## 2020-12-10 ENCOUNTER — Encounter: Payer: Self-pay | Admitting: *Deleted

## 2020-12-10 ENCOUNTER — Ambulatory Visit
Admission: RE | Admit: 2020-12-10 | Discharge: 2020-12-10 | Disposition: A | Discharge status: Home / Self Care | Payer: Medicare Other | Source: Ambulatory Visit | Attending: Radiation Oncology | Admitting: Radiation Oncology

## 2020-12-10 DIAGNOSIS — C50412 Malignant neoplasm of upper-outer quadrant of left female breast: Secondary | ICD-10-CM | POA: Diagnosis not present

## 2020-12-10 DIAGNOSIS — Z483 Aftercare following surgery for neoplasm: Secondary | ICD-10-CM

## 2020-12-10 DIAGNOSIS — Z170 Estrogen receptor positive status [ER+]: Secondary | ICD-10-CM

## 2020-12-10 DIAGNOSIS — M25612 Stiffness of left shoulder, not elsewhere classified: Secondary | ICD-10-CM

## 2020-12-10 DIAGNOSIS — R600 Localized edema: Secondary | ICD-10-CM

## 2020-12-10 DIAGNOSIS — R293 Abnormal posture: Secondary | ICD-10-CM

## 2020-12-10 NOTE — Patient Instructions (Signed)
Manual Lymph Drainage for Left Breast.  Do daily.  Do slowly. Use flat hands with just enough pressure to stretch the skin. Do not slide over the skin, but move the skin with the hand you're using. Lie down or sit comfortably (in a recliner, for example) to do this.  1) Hug yourself:  cross arms and do circles at collar bones near neck 5-7 times (to "wake up" lots of lymph nodes in this area). 2) Take slow deep breaths, allowing your belly to balloon out as your breathe in, 5x (to "wake up" abdominal lymph nodes to take on extra fluid). 3) Right armpit-stretch skin in small circles to stimulate intact lymph nodes there, 5-7x. 4) Left groin area, at panty line-stretch skin in small circles to stimulate lymph nodes 5-7x. 5) Redirect fluid from left chest toward right armpit (stretch skin starting at left chest in 3-4 spots working toward right armpit) 3-4x across the chest. 6) Redirect fluid from left armpit toward left groin (cup your hand around the curve of your left side and do 3-4 "pumps" from armpit to groin) 3-4x down your side. 7) Draw an imaginary diagonal line from upper outer breast through the nipple area toward lower inner breast.  Direct fluid upward and inward from this line toward the pathway across your upper chest (established in #5).  Do this in three rows to treat all of the upper inner breast tissue, and do each row 3-4x. 8) Then repeat #5 above. 9) Direct fluid to treat all of lower outer breast tissue downward and outward toward pathway established in #6 that is aimed at the left groin. 10)  Then repeat #6 above. 11)  End with repeating #3 and #4 above.   [facility] [address] [place], [place]   [address] [phone] 

## 2020-12-10 NOTE — Therapy (Signed)
[place], [place], [address] Phone: [phone]   Fax:  [phone]  Physical Therapy Treatment  Patient Details  Name: Jennifer Sellers MRN: [id number] Date of Birth: July 02, 1946 Referring Provider (PT): Dr. Autumn Messing   Encounter Date: 12/10/2020   PT End of Session - 12/10/20 1200    Visit Number 13    Number of Visits 14    Date for PT Re-Evaluation 12/14/20    PT Start Time 1102    PT Stop Time 1156    PT Time Calculation (min) 54 min    Activity Tolerance Patient tolerated treatment well    Behavior During Therapy [facility] for tasks assessed/performed           Past Medical History:  Diagnosis Date  . Arthritis    rheumatoid arthritis  . Hypothyroidism   . Leukopenia   . Thyroid disease   . TIA (transient ischemic attack) 2011    Past Surgical History:  Procedure Laterality Date  . ABDOMINAL HYSTERECTOMY    . BREAST LUMPECTOMY WITH RADIOACTIVE SEED AND SENTINEL LYMPH NODE BIOPSY Left 10/05/2020   Procedure: LEFT BREAST LUMPECTOMY WITH RADIOACTIVE SEED AND SENTINEL LYMPH NODE BIOPSY;  Surgeon: Jovita Kussmaul, MD;  Location: [place];  Service: General;  Laterality: Left;  GENERAL AND PECTORAL BLOCK  . CARPAL TUNNEL RELEASE    . COLONOSCOPY    . COLONOSCOPY WITH PROPOFOL N/A 06/30/2018   Procedure: COLONOSCOPY WITH PROPOFOL;  Surgeon: Manya Silvas, MD;  Location: [facility] ENDOSCOPY;  Service: Endoscopy;  Laterality: N/A;  . colpo-ureteroscopy    . lens surgery      There were no vitals filed for this visit.   Subjective Assessment - 12/10/20 1105    Subjective Had my radiation this am. No discomfort in posterior arm today. Fewer shooting pains through chest. Cording doesn't seem to bother me much.  I havent done my lymph drainage lately.    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 Patient underwent a  left lumpectomy and sentinel node biopsy (3 negative nodes) on 10/05/2020. It is ER/PR positive and HER2 negative with a Ki67 of 10%.    Currently in Pain? No/denies    Pain Score 0-No pain    Pain Orientation Left    Pain Onset 1 to 4 weeks ago                             [facility] Adult PT Treatment/Exercise - 12/10/20 0001      Manual Therapy   Soft tissue mobilization to area of lumpectomy scar to soften area and decrease scar tissue    Myofascial Release To area of cording at left axilla and antecubital fossa    Manual Lymphatic Drainage (MLD) in supine : short neck, 5 diaphragmatic breaths, left axillary nodes, left inguinal nodes and establishment of axillo inguinal pathway, left breast moving fluid towards pathways then retracing all steps. Added upper arm today.  Review MLD with pt and reprinted instructions for her   Passive ROM PROM left shoulder flex, D 2 flex and abduction                  PT Education - 12/10/20 1158    Education Details Reviewed Self MLD to left breast again and gave another handout.  She still requires occasional VC's for direction of stretch but  improved overall    Person(s) Educated Patient    Methods Explanation;Demonstration    Comprehension Verbalized understanding;Returned demonstration;Tactile cues required;Verbal cues required               PT Minium Term Goals - 11/26/20 1118      PT Dor TERM GOAL #1   Title Patient will demonstrate she has regained full shoulder ROM and function post operatively compared to baselines.    Time 4    Period Weeks    Status Achieved      PT Eisel TERM GOAL #2   Title Patient will report she is able to lay in the required postion for radiation without c/o increaed pain.    Time 4    Period Weeks    Status Achieved      PT Colombe TERM GOAL #3   Title Patient will improve her DASH score to be </= 13.64 for improved overall arm function and to achieve baseline score.    Period Weeks     Status On-going      PT Ertel TERM GOAL #4   Title Patient will improve left shoulder active flexion and abduction to be >/= 140 degrees for increased ease reaching.    Period Weeks    Status On-going      PT Coiner TERM GOAL #5   Title Patient will verbalize good understanding of lymphedema risk reduction practices.    Baseline requires some cueing    Period Weeks    Status Achieved                 Plan - 12/10/20 1200    Clinical Impression Statement pt continues to have several areas with cording that don't seem to bother her too much with her daily activities. She tolerated ROM very well today with complaints of tightness only.  We reviewed self MLD and pt is improving, but does require VC's and TC's for proper direction of stretch.  she was given another handout and was told she can activate Left groin only and not right axillary LN and pathway as [place] as swelling remains only at lateral incision area.    Stability/Clinical Decision Making Stable/Uncomplicated    Rehab Potential Excellent    PT Frequency 2x / week    PT Duration 3 weeks    PT Treatment/Interventions ADLs/Self Care Home Management;Therapeutic exercise;Patient/family education;Manual techniques;Manual lymph drainage;Passive range of motion;Scar mobilization    PT Next Visit Plan Reassess goals, ROM etc, continue RX,    PT Home Exercise Plan Post op shoulder ROM HEP and closed chain flexion and abduction, Self MLD    Recommended Other Services check sleeve and new compression bra when she receives    Consulted and Agree with Plan of Care Patient           Patient will benefit from skilled therapeutic intervention in order to improve the following deficits and impairments:  Postural dysfunction,Decreased range of motion,Decreased knowledge of precautions,Impaired UE functional use,Pain,Increased edema,Decreased scar mobility,Increased fascial restricitons  Visit Diagnosis: Stiffness of left shoulder, not  elsewhere classified  Localized edema  Aftercare following surgery for neoplasm  Abnormal posture  Malignant neoplasm of upper-outer quadrant of left breast in female, estrogen receptor positive ([place])     Problem List Patient Active Problem List   Diagnosis Date Noted  . Malignant neoplasm of upper-outer quadrant of left breast in female, estrogen receptor positive ([place]) 08/24/2020  . Estrogen deficiency 05/24/2019  . Vitamin D deficiency 08/03/2015  .  Routine general medical examination at a health care facility 05/15/2015  . Dyspepsia 08/23/2014  . Abnormal ankle brachial index 08/23/2014  . Rheumatoid arthritis ([place]) 06/19/2014  . Thrombocytopenia ([place]) 06/19/2014  . Encounter for Medicare annual wellness exam 11/11/2013  . Hyperbilirubinemia 08/19/2013  . Hypothyroid 09/21/2012  . NEOPLASMS UNSPEC NATURE BONE SOFT TISSUE&SKIN 10/23/2010  . 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 OTHER SYMPTOMS REFERABLE JOINT SITE UNSPECIFIED 10/23/2010  . CARPAL TUNNEL SYNDROME, HX OF 04/05/2009  . Leukocytopenia 05/02/2008  . DRY MOUTH 04/28/2008  . INSOMNIA, TRANSIENT 04/27/2007  . Osteopenia 04/27/2007  . Hyperlipidemia 04/23/2007  . HIATAL HERNIA 04/23/2007  . POSTMENOPAUSAL STATUS 04/23/2007  . RHEUMATOID FACTOR, POSITIVE 04/23/2007    Elsie Ra [facility] 12/10/2020, 12:05 PM  [place], [place], [address] Phone: [phone]   Fax:  [phone]  Name: Jennifer Sellers MRN: [id number] Date of Birth: May 09, 1946 Cheral Almas, PT 12/10/20 12:07 PM

## 2020-12-11 ENCOUNTER — Other Ambulatory Visit: Payer: Self-pay

## 2020-12-11 ENCOUNTER — Ambulatory Visit
Admission: RE | Admit: 2020-12-11 | Discharge: 2020-12-11 | Disposition: A | Discharge status: Home / Self Care | Payer: Medicare Other | Source: Ambulatory Visit | Attending: Radiation Oncology | Admitting: Radiation Oncology

## 2020-12-11 DIAGNOSIS — C50412 Malignant neoplasm of upper-outer quadrant of left female breast: Secondary | ICD-10-CM | POA: Diagnosis not present

## 2020-12-12 ENCOUNTER — Other Ambulatory Visit: Payer: Self-pay

## 2020-12-12 ENCOUNTER — Ambulatory Visit
Admission: RE | Admit: 2020-12-12 | Discharge: 2020-12-12 | Disposition: A | Discharge status: Home / Self Care | Payer: Medicare Other | Source: Ambulatory Visit | Attending: Radiation Oncology | Admitting: Radiation Oncology

## 2020-12-12 DIAGNOSIS — C50412 Malignant neoplasm of upper-outer quadrant of left female breast: Secondary | ICD-10-CM | POA: Diagnosis not present

## 2020-12-13 ENCOUNTER — Other Ambulatory Visit: Payer: Self-pay

## 2020-12-13 ENCOUNTER — Ambulatory Visit
Admission: RE | Admit: 2020-12-13 | Discharge: 2020-12-13 | Disposition: A | Discharge status: Home / Self Care | Payer: Medicare Other | Source: Ambulatory Visit | Attending: Radiation Oncology | Admitting: Radiation Oncology

## 2020-12-13 ENCOUNTER — Encounter: Payer: Self-pay | Admitting: *Deleted

## 2020-12-13 DIAGNOSIS — C50412 Malignant neoplasm of upper-outer quadrant of left female breast: Secondary | ICD-10-CM | POA: Diagnosis not present

## 2020-12-14 ENCOUNTER — Ambulatory Visit: Payer: Medicare Other

## 2020-12-14 ENCOUNTER — Ambulatory Visit
Admission: RE | Admit: 2020-12-14 | Discharge: 2020-12-14 | Disposition: A | Discharge status: Home / Self Care | Payer: Medicare Other | Source: Ambulatory Visit | Attending: Radiation Oncology | Admitting: Radiation Oncology

## 2020-12-14 DIAGNOSIS — C50412 Malignant neoplasm of upper-outer quadrant of left female breast: Secondary | ICD-10-CM | POA: Diagnosis not present

## 2020-12-17 ENCOUNTER — Encounter: Payer: Self-pay | Admitting: Radiation Oncology

## 2020-12-17 NOTE — Progress Notes (Signed)
  Patient Name: Jennifer Sellers MRN: [id number] DOB: [birth year] Referring Physician: Truitt Merle   Date of Service: 12/14/2020 [facility] [place]                                                        End Of Treatment Note  Diagnoses: C50.412-Malignant neoplasm of upper-outer quadrant of left female breast  Cancer Staging: Stage IA, pT2N0M0 grade 1, ER/PR positive invasive lobular carcinoma of the left breast.  Intent: Curative  Radiation Treatment Dates: 11/19/2020 through 12/14/2020 Site Technique Total Dose (Gy) Dose per Fx (Gy) Completed Fx Beam Energies  Breast, Left: Breast_Lt 3D 42.56/42.56 2.66 16/16 6X  Breast, Left: Breast_Lt_Bst 3D 8/8 2 4/4 6X   Narrative: The patient tolerated radiation therapy relatively well. She had only faint hyperpigmentation of the breast at the completion of her treatment. She has a history of RA but did not receive immunomodulating medications during radiotherapy.  Plan: The patient will be contacted by phone by radiation oncology for follow up in about 1 month's time.  ________________________________________________    Carola Rhine, PAC

## 2020-12-20 ENCOUNTER — Other Ambulatory Visit: Payer: Self-pay

## 2020-12-20 ENCOUNTER — Ambulatory Visit: Payer: Medicare Other | Attending: General Surgery

## 2020-12-20 DIAGNOSIS — R293 Abnormal posture: Secondary | ICD-10-CM | POA: Insufficient documentation

## 2020-12-20 DIAGNOSIS — M25612 Stiffness of left shoulder, not elsewhere classified: Secondary | ICD-10-CM | POA: Diagnosis not present

## 2020-12-20 DIAGNOSIS — C50412 Malignant neoplasm of upper-outer quadrant of left female breast: Secondary | ICD-10-CM | POA: Diagnosis present

## 2020-12-20 DIAGNOSIS — R600 Localized edema: Secondary | ICD-10-CM | POA: Insufficient documentation

## 2020-12-20 DIAGNOSIS — Z170 Estrogen receptor positive status [ER+]: Secondary | ICD-10-CM | POA: Diagnosis present

## 2020-12-20 DIAGNOSIS — Z483 Aftercare following surgery for neoplasm: Secondary | ICD-10-CM | POA: Diagnosis present

## 2020-12-20 NOTE — Therapy (Signed)
[place], [place], [address] Phone: [phone]   Fax:  [phone]  Physical Therapy Treatment  Patient Details  Name: Jennifer Sellers MRN: [id number] Date of Birth: 12-Jun-1946 Referring Provider (PT): Dr. Autumn Messing   Encounter Date: 12/20/2020   PT End of Session - 12/20/20 1741    Visit Number 14    Number of Visits 18    Date for PT Re-Evaluation 01/17/21    PT Start Time 1500    PT Stop Time 1555    PT Time Calculation (min) 55 min    Activity Tolerance Patient tolerated treatment well    Behavior During Therapy [facility] for tasks assessed/performed           Past Medical History:  Diagnosis Date  . Arthritis    rheumatoid arthritis  . Hypothyroidism   . Leukopenia   . Thyroid disease   . TIA (transient ischemic attack) 2011    Past Surgical History:  Procedure Laterality Date  . ABDOMINAL HYSTERECTOMY    . BREAST LUMPECTOMY WITH RADIOACTIVE SEED AND SENTINEL LYMPH NODE BIOPSY Left 10/05/2020   Procedure: LEFT BREAST LUMPECTOMY WITH RADIOACTIVE SEED AND SENTINEL LYMPH NODE BIOPSY;  Surgeon: Jovita Kussmaul, MD;  Location: [facility];  Service: General;  Laterality: Left;  GENERAL AND PECTORAL BLOCK  . CARPAL TUNNEL RELEASE    . COLONOSCOPY    . COLONOSCOPY WITH PROPOFOL N/A 06/30/2018   Procedure: COLONOSCOPY WITH PROPOFOL;  Surgeon: Manya Silvas, MD;  Location: [facility] ENDOSCOPY;  Service: Endoscopy;  Laterality: N/A;  . colpo-ureteroscopy    . lens surgery      There were no vitals filed for this visit.   Subjective Assessment - 12/20/20 1505    Subjective Still getting some intermittent shooting pains in her breast on occasion.  Pt. reports doing her MLD at home and keeping up with her stretches.   Using arm more at home doing every day stuff.  No present pain. Breast is still a little red and tender.  Got sleeve and gauntlet this am and brought with her to try on.   compression bra has not arrived yet.    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 Patient underwent a left lumpectomy and sentinel node biopsy (3 negative nodes) on 10/05/2020. It is ER/PR positive and HER2 negative with a Ki67 of 10%.    Patient Stated Goals Review MLD, have sleeve and gauntlet checked, compression bra checked when it comes in    Currently in Pain? No/denies    Pain Score 0-No pain              [facility] PT Assessment - 12/20/20 0001      AROM   Right Shoulder Extension 73 Degrees    Right Shoulder Flexion 156 Degrees    Right Shoulder ABduction 170 Degrees    Right Shoulder External Rotation 88 Degrees    Left Shoulder Extension 65 Degrees    Left Shoulder Flexion 155 Degrees    Left Shoulder ABduction 162 Degrees    Left Shoulder External Rotation 85 Degrees             LYMPHEDEMA/ONCOLOGY QUESTIONNAIRE - 12/20/20 0001      Right Upper Extremity Lymphedema   10 cm Proximal to Olecranon Process 24 cm    Olecranon Process 23.5 cm    10 cm Proximal to Ulnar Styloid Process 17.3 cm  Just Proximal to Ulnar Styloid Process 14.8 cm    Across Hand at [employer] 19.7 cm    At [place] of 2nd Digit 6.4 cm      Left Upper Extremity Lymphedema   10 cm Proximal to Olecranon Process 24.3 cm    Olecranon Process 23.4 cm    10 cm Proximal to Ulnar Styloid Process 17.4 cm    Just Proximal to Ulnar Styloid Process 14.8 cm    Across Hand at [employer] 19 cm    At [place] of 2nd Digit 6 cm              Quick Dash - 12/20/20 0001    Open a tight or new jar Moderate difficulty    Do heavy household chores (wash walls, wash floors) Severe difficulty    Carry a shopping bag or briefcase Mild difficulty    Wash your back Mild difficulty    Use a knife to cut food No difficulty    Recreational activities in which you take some force or impact through your arm, shoulder, or hand (golf,  hammering, tennis) Severe difficulty    During the past week, to what extent has your arm, shoulder or hand problem interfered with your normal social activities with family, friends, neighbors, or groups? Not at all    During the past week, to what extent has your arm, shoulder or hand problem limited your work or other regular daily activities Modererately    Arm, shoulder, or hand pain. Mild    Tingling (pins and needles) in your arm, shoulder, or hand None    Difficulty Sleeping Mild difficulty    DASH Score 31.82 %                  [facility] Adult PT Treatment/Exercise - 12/20/20 0001      Shoulder Exercises: Pulleys   Flexion 2 minutes    Scaption 1 minute    ABduction 1 minute      Manual Therapy   Edema Management showed pt how to don compression sleeve and gauntlet.  Discussed wearing of sleeve and importance of checking her hand to make sure it is not swelling.  Want her to wear for 2 hours several times over the next few days so she can see if it is comfortable.                       PT Blash Term Goals - 12/20/20 1528      PT [place] TERM GOAL #1   Title Patient will demonstrate she has regained full shoulder ROM and function post operatively compared to baselines.    Time 4    Period Weeks  achieved     PT Chilcott TERM GOAL #2   Title Patient will report she is able to lay in the required postion for radiation without c/o increaed pain.    Time 4    Period Weeks    Status Achieved      PT Kohl TERM GOAL #3   Title Patient will improve her DASH score to be </= 13.64 for improved overall arm function and to achieve baseline score.    Baseline pt contines with higher score not because she IS limited, but because she is concerned about over doing it.    Time 4    Period Weeks    Status On-going      PT Rutt TERM GOAL #4   Title  Patient will improve left shoulder active flexion and abduction to be >/= 140 degrees for increased ease reaching.    Time 4     Period Weeks    Status Achieved      PT Lodico TERM GOAL #5   Title Patient will verbalize good understanding of lymphedema risk reduction practices.    Baseline requires some cueing    Time 4    Period Weeks    Status Achieved      Additional Parrow Term Goals   Additional Hanel Term Goals --      PT Rosasco TERM GOAL #6   Title Pt will be fit for prophylactic sleeve and gauntlet and will be able to don/doff independently and will be educated in proper times to wear    Time 2    Period Weeks    Status New    Target Date 01/03/21      PT Hinkle TERM GOAL #7   Title Pts. compression bra will fit properly to enable reduction in edema    Time 3    Period Weeks    Status New    Target Date 01/10/21      PT Hakimi TERM GOAL #8   Title Pt will be independent in self breast MLD and scar massage to reduce fibrosis/edema    Time 4    Period Weeks    Status New    Target Date 01/17/21                 Plan - 12/20/20 1743    Clinical Impression Statement Pt has continued with her HEP and is doing her MLD at home.  She received her sleeve and gauntlet in the mail today.  She practiced donning in clinic, and did have some trouble getting the sleeve over her hand because she has a very small wrist.  I showed her how to use rubber gloves to don properly.  Sleeve seemed to fit well and was comfortable for her.  The gauntlet did seem a bit small at the knuckles but she is going to try and wear for several hours at a time to be sure it fits.  She has not yet received her compression bra but will bring it in to check it as well when she receives it.  She will benefit from further review of precautions and MLD, with rechecks of cording at axilla..  Will decrease to 1x per week    Stability/Clinical Decision Making Stable/Uncomplicated    Rehab Potential Excellent    PT Frequency 1x / week    PT Duration 4 weeks    PT Treatment/Interventions ADLs/Self Care Home Management;Therapeutic  exercise;Patient/family education;Manual techniques;Manual lymph drainage;Passive range of motion;Scar mobilization    PT Next Visit Plan Reassess cording at axilla, Review precautions, Continue scar mobilization and MLD and review Self MLD    PT Home Exercise Plan Post op shoulder ROM HEP and closed chain flexion and abduction, Self MLD    Recommended Other Services recheck sleeve/ check bra when received    Consulted and Agree with Plan of Care Patient           Patient will benefit from skilled therapeutic intervention in order to improve the following deficits and impairments:  Postural dysfunction,Decreased range of motion,Decreased knowledge of precautions,Impaired UE functional use,Pain,Increased edema,Decreased scar mobility,Increased fascial restricitons  Visit Diagnosis: Stiffness of left shoulder, not elsewhere classified  Localized edema  Aftercare following surgery for neoplasm  Abnormal posture  Malignant  neoplasm of upper-outer quadrant of left breast in female, estrogen receptor positive ([place])     Problem List Patient Active Problem List   Diagnosis Date Noted  . Malignant neoplasm of upper-outer quadrant of left breast in female, estrogen receptor positive ([place]) 08/24/2020  . Estrogen deficiency 05/24/2019  . Vitamin D deficiency 08/03/2015  . Routine general medical examination at a health care facility 05/15/2015  . Dyspepsia 08/23/2014  . Abnormal ankle brachial index 08/23/2014  . Rheumatoid arthritis ([place]) 06/19/2014  . Thrombocytopenia ([place]) 06/19/2014  . Encounter for Medicare annual wellness exam 11/11/2013  . Hyperbilirubinemia 08/19/2013  . Hypothyroid 09/21/2012  . NEOPLASMS UNSPEC NATURE BONE SOFT TISSUE&SKIN 10/23/2010  . 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 OTHER SYMPTOMS REFERABLE JOINT SITE UNSPECIFIED 10/23/2010  . CARPAL TUNNEL SYNDROME, HX OF 04/05/2009  . Leukocytopenia 05/02/2008  . DRY MOUTH 04/28/2008  . INSOMNIA, TRANSIENT 04/27/2007  .  Osteopenia 04/27/2007  . Hyperlipidemia 04/23/2007  . HIATAL HERNIA 04/23/2007  . POSTMENOPAUSAL STATUS 04/23/2007  . RHEUMATOID FACTOR, POSITIVE 04/23/2007    Claris Pong 12/20/2020, 6:02 PM  [facility] [place], [place], [address] Phone: [phone]   Fax:  [phone]  Name: JACLIN FINKS MRN: [id number] Date of Birth: 1946-06-05  Cheral Almas, PT 12/20/20 6:05 PM

## 2020-12-26 ENCOUNTER — Ambulatory Visit: Payer: Medicare Other

## 2020-12-26 ENCOUNTER — Other Ambulatory Visit: Payer: Self-pay

## 2020-12-26 DIAGNOSIS — Z483 Aftercare following surgery for neoplasm: Secondary | ICD-10-CM

## 2020-12-26 DIAGNOSIS — R600 Localized edema: Secondary | ICD-10-CM

## 2020-12-26 DIAGNOSIS — R293 Abnormal posture: Secondary | ICD-10-CM

## 2020-12-26 DIAGNOSIS — M25612 Stiffness of left shoulder, not elsewhere classified: Secondary | ICD-10-CM

## 2020-12-26 NOTE — Therapy (Signed)
[place], [place], [address] Phone: [phone]   Fax:  [phone]  Physical Therapy Treatment  Patient Details  Name: Jennifer Sellers MRN: [id number] Date of Birth: 03-03-1946 Referring Provider (PT): Dr. Autumn Messing   Encounter Date: 12/26/2020   PT End of Session - 12/26/20 1350    Visit Number 15    Number of Visits 18    Date for PT Re-Evaluation 01/17/21    PT Start Time 1300    PT Stop Time 1355    PT Time Calculation (min) 55 min    Activity Tolerance Patient tolerated treatment well    Behavior During Therapy [facility] for tasks assessed/performed           Past Medical History:  Diagnosis Date  . Arthritis    rheumatoid arthritis  . Hypothyroidism   . Leukopenia   . Thyroid disease   . TIA (transient ischemic attack) 2011    Past Surgical History:  Procedure Laterality Date  . ABDOMINAL HYSTERECTOMY    . BREAST LUMPECTOMY WITH RADIOACTIVE SEED AND SENTINEL LYMPH NODE BIOPSY Left 10/05/2020   Procedure: LEFT BREAST LUMPECTOMY WITH RADIOACTIVE SEED AND SENTINEL LYMPH NODE BIOPSY;  Surgeon: Jovita Kussmaul, MD;  Location: [facility];  Service: General;  Laterality: Left;  GENERAL AND PECTORAL BLOCK  . CARPAL TUNNEL RELEASE    . COLONOSCOPY    . COLONOSCOPY WITH PROPOFOL N/A 06/30/2018   Procedure: COLONOSCOPY WITH PROPOFOL;  Surgeon: Manya Silvas, MD;  Location[facility] ENDOSCOPY;  Service: Endoscopy;  Laterality: N/A;  . colpo-ureteroscopy    . lens surgery      There were no vitals filed for this visit.   Subjective Assessment - 12/26/20 1259    Subjective Wore sleeve today so you can see it.  I don't really like to wear it that well. Pt reports getting it on without too much trouble.  My nipple still feels really tender since I Had the Boost but I put cream on it.  Shooting pains seem to be better. 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 Patient underwent a left lumpectomy and sentinel node biopsy (3 negative nodes) on 10/05/2020. It is ER/PR positive and HER2 negative with a Ki67 of 10%.    Currently in Pain? No/denies    Pain Score 0-No pain    Pain Onset More than a month ago    Pain Frequency Intermittent                             [facility] Adult PT Treatment/Exercise - 12/26/20 0001      Shoulder Exercises: Pulleys   Flexion 2 minutes    ABduction 2 minutes      Manual Therapy   Soft tissue mobilization to area of lumpectomy scar to soften area and decrease scar tissue    Myofascial Release To area of cording at left axilla and antecubital fossa    Manual Lymphatic Drainage (MLD) in supine : short neck, 5 diaphragmatic breaths, left axillary nodes, left inguinal nodes and establishment of axillo inguinal pathway, left breast moving fluid towards pathways then retracing all steps. Added upper ar    Passive ROM PROM left shoulder flex and abduction  PT Faith Term Goals - 12/20/20 1528      PT Marotto TERM GOAL #1   Title Patient will demonstrate she has regained full shoulder ROM and function post operatively compared to baselines.    Time 4    Period Weeks      PT Sotelo TERM GOAL #2   Title Patient will report she is able to lay in the required postion for radiation without c/o increaed pain.    Time 4    Period Weeks    Status Achieved      PT Hammers TERM GOAL #3   Title Patient will improve her DASH score to be </= 13.64 for improved overall arm function and to achieve baseline score.    Baseline pt contines with higher score not because she IS limited, but because she is concerned about over doing it.    Time 4    Period Weeks    Status On-going      PT Snavely TERM GOAL #4   Title Patient will improve left shoulder active flexion and abduction to be >/= 140 degrees for increased  ease reaching.    Time 4    Period Weeks    Status Achieved      PT Hipp TERM GOAL #5   Title Patient will verbalize good understanding of lymphedema risk reduction practices.    Baseline requires some cueing    Time 4    Period Weeks    Status Achieved      Additional [place] Term Goals   Additional [place] Term Goals --      PT Ting TERM GOAL #6   Title Pt will be fit for prophylactic sleeve and gauntlet and will be able to don/doff independently and will be educated in proper times to wear    Time 2    Period Weeks    Status New    Target Date 01/03/21      PT A[place] TERM GOAL #7   Title Pts. compression bra will fit properly to enable reduction in edema    Time 3    Period Weeks    Status New    Target Date 01/10/21      PT Cerone TERM GOAL #8   Title Pt will be independent in self breast MLD and scar massage to reduce fibrosis/edema    Time 4    Period Weeks    Status New    Target Date 01/17/21                 Plan - 12/26/20 1352    Clinical Impression Statement Foam added to chip pack for better edema management.  MFR techniques performed to Left  axillary region areas of mild cording, Scar mobilization to lumpectomy incision, MLD to Right lateral breast, and PROM to left UE to restore functional ROM.  Pt requires VC's initially ro relax for PROM.  Lumpectomy incision much softer after scar massage and MLD.  Pt is independent in applying compression sleeve and gauntlet.  Pt has not received her compression bra yet.    Stability/Clinical Decision Making Stable/Uncomplicated    Rehab Potential Excellent    PT Frequency 1x / week    PT Duration 4 weeks    PT Treatment/Interventions ADLs/Self Care Home Management;Therapeutic exercise;Patient/family education;Manual techniques;Manual lymph drainage;Passive range of motion;Scar mobilization    PT Next Visit Plan Reassess cording at axilla, Review precautions, Continue scar mobilization and MLD and review Self MLD, pulleys  PT Home Exercise Plan Post op shoulder ROM HEP and closed chain flexion and abduction, Self MLD    Consulted and Agree with Plan of Care Patient           Patient will benefit from skilled therapeutic intervention in order to improve the following deficits and impairments:  Postural dysfunction,Decreased range of motion,Decreased knowledge of precautions,Impaired UE functional use,Pain,Increased edema,Decreased scar mobility,Increased fascial restricitons  Visit Diagnosis: Stiffness of left shoulder, not elsewhere classified  Localized edema  Aftercare following surgery for neoplasm  Abnormal posture     Problem List Patient Active Problem List   Diagnosis Date Noted  . Malignant neoplasm of upper-outer quadrant of left breast in female, estrogen receptor positive ([place]) 08/24/2020  . Estrogen deficiency 05/24/2019  . Vitamin D deficiency 08/03/2015  . Routine general medical examination at a health care facility 05/15/2015  . Dyspepsia 08/23/2014  . Abnormal ankle brachial index 08/23/2014  . Rheumatoid arthritis ([place]) 06/19/2014  . Thrombocytopenia ([place]) 06/19/2014  . Encounter for Medicare annual wellness exam 11/11/2013  . Hyperbilirubinemia 08/19/2013  . Hypothyroid 09/21/2012  . NEOPLASMS UNSPEC NATURE BONE SOFT TISSUE&SKIN 10/23/2010  . 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 OTHER SYMPTOMS REFERABLE JOINT SITE UNSPECIFIED 10/23/2010  . CARPAL TUNNEL SYNDROME, HX OF 04/05/2009  . Leukocytopenia 05/02/2008  . DRY MOUTH 04/28/2008  . INSOMNIA, TRANSIENT 04/27/2007  . Osteopenia 04/27/2007  . Hyperlipidemia 04/23/2007  . HIATAL HERNIA 04/23/2007  . POSTMENOPAUSAL STATUS 04/23/2007  . RHEUMATOID FACTOR, POSITIVE 04/23/2007    Claris Pong 12/26/2020, 2:02 PM  [facility] [place], [place], [address] Phone: [phone]   Fax:  [phone]  Name: Jennifer Sellers MRN: [id number] Date of Birth:  [birth year] Cheral Almas, PT 12/26/20 2:03 PM

## 2021-01-04 ENCOUNTER — Ambulatory Visit: Payer: Medicare Other

## 2021-01-04 ENCOUNTER — Other Ambulatory Visit: Payer: Self-pay

## 2021-01-04 DIAGNOSIS — Z170 Estrogen receptor positive status [ER+]: Secondary | ICD-10-CM

## 2021-01-04 DIAGNOSIS — M25612 Stiffness of left shoulder, not elsewhere classified: Secondary | ICD-10-CM

## 2021-01-04 DIAGNOSIS — R293 Abnormal posture: Secondary | ICD-10-CM

## 2021-01-04 DIAGNOSIS — C50412 Malignant neoplasm of upper-outer quadrant of left female breast: Secondary | ICD-10-CM

## 2021-01-04 DIAGNOSIS — Z483 Aftercare following surgery for neoplasm: Secondary | ICD-10-CM

## 2021-01-04 DIAGNOSIS — R600 Localized edema: Secondary | ICD-10-CM

## 2021-01-04 NOTE — Therapy (Signed)
[place], [place], [address] Phone: [phone]   Fax:  [phone]  Physical Therapy Treatment  Patient Details  Name: Jennifer Sellers MRN: [id number] Date of Birth: 05/02/1946 Referring Provider (PT): Dr. Autumn Messing   Encounter Date: 01/04/2021   PT End of Session - 01/04/21 0943    Visit Number 16    Number of Visits 18    Date for PT Re-Evaluation 01/17/21    PT Start Time 0853    PT Stop Time 0948    PT Time Calculation (min) 55 min    Activity Tolerance Patient tolerated treatment well    Behavior During Therapy [facility] for tasks assessed/performed           Past Medical History:  Diagnosis Date  . Arthritis    rheumatoid arthritis  . Hypothyroidism   . Leukopenia   . Thyroid disease   . TIA (transient ischemic attack) 2011    Past Surgical History:  Procedure Laterality Date  . ABDOMINAL HYSTERECTOMY    . BREAST LUMPECTOMY WITH RADIOACTIVE SEED AND SENTINEL LYMPH NODE BIOPSY Left 10/05/2020   Procedure: LEFT BREAST LUMPECTOMY WITH RADIOACTIVE SEED AND SENTINEL LYMPH NODE BIOPSY;  Surgeon: Jovita Kussmaul, MD;  Location: [place];  Service: General;  Laterality: Left;  GENERAL AND PECTORAL BLOCK  . CARPAL TUNNEL RELEASE    . COLONOSCOPY    . COLONOSCOPY WITH PROPOFOL N/A 06/30/2018   Procedure: COLONOSCOPY WITH PROPOFOL;  Surgeon: Manya Silvas, MD;  Location: [facility] ENDOSCOPY;  Service: Endoscopy;  Laterality: N/A;  . colpo-ureteroscopy    . lens surgery      There were no vitals filed for this visit.   Subjective Assessment - 01/04/21 0850    Subjective Think I am doing pretty well. Still have some tenderness at nipple. Got my new bra and it seems to fit well although it does roll some, and it is comfortable. Yesterday I had a deep pain in my breast and it was quick and went away.  Shooting pains are overall less.  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 Patient underwent a left lumpectomy and sentinel node biopsy (3 negative nodes) on 10/05/2020. It is ER/PR positive and HER2 negative with a Ki67 of 10%.    Patient Stated Goals Review MLD, have sleeve and gauntlet checked, compression bra checked when it comes in    Currently in Pain? No/denies              [facility] PT Assessment - 01/04/21 0001      AROM   Right Shoulder Flexion 157 Degrees    Right Shoulder ABduction 172 Degrees                         [facility] Adult PT Treatment/Exercise - 01/04/21 0001      Shoulder Exercises: Pulleys   Flexion 2 minutes    ABduction 2 minutes      Manual Therapy   Soft tissue mobilization to area of lumpectomy scar to soften area and decrease scar tissue    Myofascial Release To area of cording at left axilla and antecubital fossa    Manual Lymphatic Drainage (MLD) in supine : short neck, 5 diaphragmatic breaths, left axillary nodes, left inguinal nodes and establishment of axillo inguinal pathway, left breast moving fluid towards  pathways then retracing all steps. Added upper ar    Passive ROM PROM left shoulder flex and abduction                  PT Education - 01/04/21 0943    Education Details Reviewed self MLD with therapist performing    Person(s) Educated Patient    Methods Explanation;Demonstration    Comprehension Verbalized understanding               PT Sills Term Goals - 12/20/20 1528      PT Buczynski TERM GOAL #1   Title Patient will demonstrate she has regained full shoulder ROM and function post operatively compared to baselines.    Time 4    Period Weeks      PT Seidman TERM GOAL #2   Title Patient will report she is able to lay in the required postion for radiation without c/o increaed pain.    Time 4    Period Weeks    Status Achieved      PT Granier TERM GOAL #3   Title Patient will improve her DASH  score to be </= 13.64 for improved overall arm function and to achieve baseline score.    Baseline pt contines with higher score not because she IS limited, but because she is concerned about over doing it.    Time 4    Period Weeks    Status On-going      PT Ancrum TERM GOAL #4   Title Patient will improve left shoulder active flexion and abduction to be >/= 140 degrees for increased ease reaching.    Time 4    Period Weeks    Status Achieved      PT Bodey TERM GOAL #5   Title Patient will verbalize good understanding of lymphedema risk reduction practices.    Baseline requires some cueing    Time 4    Period Weeks    Status Achieved      Additional Leifheit Term Goals   Additional Baril Term Goals --      PT Schiffer TERM GOAL #6   Title Pt will be fit for prophylactic sleeve and gauntlet and will be able to don/doff independently and will be educated in proper times to wear    Time 2    Period Weeks    Status New    Target Date 01/03/21      PT Dishner TERM GOAL #7   Title Pts. compression bra will fit properly to enable reduction in edema    Time 3    Period Weeks    Status New    Target Date 01/10/21      PT Pankowski TERM GOAL #8   Title Pt will be independent in self breast MLD and scar massage to reduce fibrosis/edema    Time 4    Period Weeks    Status New    Target Date 01/17/21                 Plan - 01/04/21 0947    Clinical Impression Statement Pt has been compliant with chip pack and her new compression bra which is very comfortable for her. She hasn't performed MLD to her breast much this week and she required review of proper direction.  Her shoulder ROM is doing well.  Mild cording is still present in the axillary region but does not appear to limit her much.  She continues with mild swelling at incision  site.  She is due for a SOZO screen and we will do next week during her regular appt.    Stability/Clinical Decision Making Stable/Uncomplicated    Rehab Potential  Excellent    PT Frequency 1x / week    PT Duration 4 weeks    PT Treatment/Interventions ADLs/Self Care Home Management;Therapeutic exercise;Patient/family education;Manual techniques;Manual lymph drainage;Passive range of motion;Scar mobilization    PT Next Visit Plan SOZO, review precautions, continue Scar mobs, review MLD    PT Home Exercise Plan Post op shoulder ROM HEP and closed chain flexion and abduction, Self MLD    Consulted and Agree with Plan of Care Patient           Patient will benefit from skilled therapeutic intervention in order to improve the following deficits and impairments:  Postural dysfunction,Decreased range of motion,Decreased knowledge of precautions,Impaired UE functional use,Pain,Increased edema,Decreased scar mobility,Increased fascial restricitons  Visit Diagnosis: Stiffness of left shoulder, not elsewhere classified  Localized edema  Aftercare following surgery for neoplasm  Abnormal posture  Malignant neoplasm of upper-outer quadrant of left breast in female, estrogen receptor positive ([place])     Problem List Patient Active Problem List   Diagnosis Date Noted  . Malignant neoplasm of upper-outer quadrant of left breast in female, estrogen receptor positive ([place]) 08/24/2020  . Estrogen deficiency 05/24/2019  . Vitamin D deficiency 08/03/2015  . Routine general medical examination at a health care facility 05/15/2015  . Dyspepsia 08/23/2014  . Abnormal ankle brachial index 08/23/2014  . Rheumatoid arthritis ([place]) 06/19/2014  . Thrombocytopenia ([place]) 06/19/2014  . Encounter for Medicare annual wellness exam 11/11/2013  . Hyperbilirubinemia 08/19/2013  . Hypothyroid 09/21/2012  . NEOPLASMS UNSPEC NATURE BONE SOFT TISSUE&SKIN 10/23/2010  . 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 OTHER SYMPTOMS REFERABLE JOINT SITE UNSPECIFIED 10/23/2010  . CARPAL TUNNEL SYNDROME, HX OF 04/05/2009  . Leukocytopenia 05/02/2008  . DRY MOUTH 04/28/2008  . INSOMNIA, TRANSIENT  04/27/2007  . Osteopenia 04/27/2007  . Hyperlipidemia 04/23/2007  . HIATAL HERNIA 04/23/2007  . POSTMENOPAUSAL STATUS 04/23/2007  . RHEUMATOID FACTOR, POSITIVE 04/23/2007    Elsie Ra [facility] 01/04/2021, 9:58 AM  [place], [place], [address] Phone: [phone]   Fax:  [phone]  Name: Jennifer Sellers MRN: [id number] Date of Birth: [birth year] Cheral Almas, PT 01/04/21 10:00 AM

## 2021-01-09 ENCOUNTER — Ambulatory Visit: Payer: Medicare Other

## 2021-01-09 ENCOUNTER — Telehealth: Payer: Self-pay | Admitting: Nurse Practitioner

## 2021-01-09 ENCOUNTER — Other Ambulatory Visit: Payer: Self-pay

## 2021-01-09 DIAGNOSIS — Z483 Aftercare following surgery for neoplasm: Secondary | ICD-10-CM

## 2021-01-09 DIAGNOSIS — M25612 Stiffness of left shoulder, not elsewhere classified: Secondary | ICD-10-CM

## 2021-01-09 DIAGNOSIS — C50412 Malignant neoplasm of upper-outer quadrant of left female breast: Secondary | ICD-10-CM

## 2021-01-09 DIAGNOSIS — R600 Localized edema: Secondary | ICD-10-CM

## 2021-01-09 DIAGNOSIS — R293 Abnormal posture: Secondary | ICD-10-CM

## 2021-01-09 DIAGNOSIS — Z170 Estrogen receptor positive status [ER+]: Secondary | ICD-10-CM

## 2021-01-09 NOTE — Telephone Encounter (Signed)
Contacted patient about moved appointment per provider template.Patient is aware.  

## 2021-01-09 NOTE — Therapy (Signed)
[place], [place], [address] Phone: [phone]   Fax:  [phone]  Physical Therapy Treatment  Patient Details  Name: Jennifer Sellers MRN: [id number] Date of Birth: November 23, 1945 Referring Provider (PT): Dr. Autumn Messing   Encounter Date: 01/09/2021   PT End of Session - 01/09/21 1000    Visit Number 17    Number of Visits 18    Date for PT Re-Evaluation 01/17/21    PT Start Time 0900    PT Stop Time 0954    PT Time Calculation (min) 54 min    Activity Tolerance Patient tolerated treatment well    Behavior During Therapy [facility] for tasks assessed/performed           Past Medical History:  Diagnosis Date  . Arthritis    rheumatoid arthritis  . Hypothyroidism   . Leukopenia   . Thyroid disease   . TIA (transient ischemic attack) 2011    Past Surgical History:  Procedure Laterality Date  . ABDOMINAL HYSTERECTOMY    . BREAST LUMPECTOMY WITH RADIOACTIVE SEED AND SENTINEL LYMPH NODE BIOPSY Left 10/05/2020   Procedure: LEFT BREAST LUMPECTOMY WITH RADIOACTIVE SEED AND SENTINEL LYMPH NODE BIOPSY;  Surgeon: Jovita Kussmaul, MD;  Location: [place];  Service: General;  Laterality: Left;  GENERAL AND PECTORAL BLOCK  . CARPAL TUNNEL RELEASE    . COLONOSCOPY    . COLONOSCOPY WITH PROPOFOL N/A 06/30/2018   Procedure: COLONOSCOPY WITH PROPOFOL;  Surgeon: Manya Silvas, MD;  Location: [facility] ENDOSCOPY;  Service: Endoscopy;  Laterality: N/A;  . colpo-ureteroscopy    . lens surgery      There were no vitals filed for this visit.   Subjective Assessment - 01/09/21 0901    Subjective My shoulder has been hurting some the last few days.  I think its arthritis.  Have done MLD some at home.    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 Patient underwent a left lumpectomy and sentinel node biopsy (3 negative nodes) on 10/05/2020. It  is ER/PR positive and HER2 negative with a Ki67 of 10%.    Currently in Pain? No/denies    Pain Score 0-No pain                  L-DEX FLOWSHEETS - 01/09/21 0900      L-DEX LYMPHEDEMA SCREENING   Measurement Type Unilateral    L-DEX MEASUREMENT EXTREMITY Upper Extremity    POSITION  Standing    DOMINANT SIDE Right    At Risk Side Left    BASELINE SCORE (UNILATERAL) 1.3    L-DEX SCORE (UNILATERAL) 3.9    VALUE CHANGE (UNILAT) 2.6                     [facility] Adult PT Treatment/Exercise - 01/09/21 0001      Manual Therapy   Edema Management Did L Dex today at pt request.    Soft tissue mobilization to area of lumpectomy scar to soften area and decrease scar tissue    Myofascial Release To area of cording at left axilla and antecubital fossa    Manual Lymphatic Drainage (MLD) in supine : short neck, 5 diaphragmatic breaths, left axillary nodes, left inguinal nodes and establishment of axillo inguinal pathway, left breast moving fluid towards pathways then retracing all steps. Reviewed and practiced all with pt.    Passive ROM PROM left shoulder  flex and abduction                       PT Cunanan Term Goals - 12/20/20 1528      PT Byas TERM GOAL #1   Title Patient will demonstrate she has regained full shoulder ROM and function post operatively compared to baselines.    Time 4    Period Weeks      PT Pretty TERM GOAL #2   Title Patient will report she is able to lay in the required postion for radiation without c/o increaed pain.    Time 4    Period Weeks    Status Achieved      PT Wamble TERM GOAL #3   Title Patient will improve her DASH score to be </= 13.64 for improved overall arm function and to achieve baseline score.    Baseline pt contines with higher score not because she IS limited, but because she is concerned about over doing it.    Time 4    Period Weeks    Status On-going      PT Novoa TERM GOAL #4   Title Patient will improve left  shoulder active flexion and abduction to be >/= 140 degrees for increased ease reaching.    Time 4    Period Weeks    Status Achieved      PT Janssen TERM GOAL #5   Title Patient will verbalize good understanding of lymphedema risk reduction practices.    Baseline requires some cueing    Time 4    Period Weeks    Status Achieved      Additional Drahos Term Goals   Additional Halderman Term Goals --      PT Exeter TERM GOAL #6   Title Pt will be fit for prophylactic sleeve and gauntlet and will be able to don/doff independently and will be educated in proper times to wear    Time 2    Period Weeks    Status New    Target Date 01/03/21      PT Spratley TERM GOAL #7   Title Pts. compression bra will fit properly to enable reduction in edema    Time 3    Period Weeks    Status New    Target Date 01/10/21      PT Zukowski TERM GOAL #8   Title Pt will be independent in self breast MLD and scar massage to reduce fibrosis/edema    Time 4    Period Weeks    Status New    Target Date 01/17/21                 Plan - 01/09/21 1001    Clinical Impression Statement Pt continues with mild cording that does not seem to effect her funtion.  Shoulder ROM is doing well although she has complained of some shoulder pain over the last few days.  Breast swelling is the best I have seen today with very min swelling at incision.  Mild scar tissue still noted at incision.  We practiced Self MLD today and she does well with technique with guidance but gets flustered because she doesn't always remember how when she gets home despite having handouts.    Stability/Clinical Decision Making Stable/Uncomplicated    Rehab Potential Excellent    PT Frequency 1x / week    PT Duration 4 weeks    PT Treatment/Interventions ADLs/Self Care Home Management;Therapeutic exercise;Patient/family education;Manual techniques;Manual  lymph drainage;Passive range of motion;Scar mobilization    PT Next Visit Plan DC next, review MLD,  PROM, MFR to cording    PT Home Exercise Plan Post op shoulder ROM HEP and closed chain flexion and abduction, Self MLD    Consulted and Agree with Plan of Care Patient           Patient will benefit from skilled therapeutic intervention in order to improve the following deficits and impairments:  Postural dysfunction,Decreased range of motion,Decreased knowledge of precautions,Impaired UE functional use,Pain,Increased edema,Decreased scar mobility,Increased fascial restricitons  Visit Diagnosis: Stiffness of left shoulder, not elsewhere classified  Localized edema  Aftercare following surgery for neoplasm  Abnormal posture  Malignant neoplasm of upper-outer quadrant of left breast in female, estrogen receptor positive ([place])     Problem List Patient Active Problem List   Diagnosis Date Noted  . Malignant neoplasm of upper-outer quadrant of left breast in female, estrogen receptor positive ([place]) 08/24/2020  . Estrogen deficiency 05/24/2019  . Vitamin D deficiency 08/03/2015  . Routine general medical examination at a health care facility 05/15/2015  . Dyspepsia 08/23/2014  . Abnormal ankle brachial index 08/23/2014  . Rheumatoid arthritis ([place]) 06/19/2014  . Thrombocytopenia ([place]) 06/19/2014  . Encounter for Medicare annual wellness exam 11/11/2013  . Hyperbilirubinemia 08/19/2013  . Hypothyroid 09/21/2012  . NEOPLASMS UNSPEC NATURE BONE SOFT TISSUE&SKIN 10/23/2010  . 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 OTHER SYMPTOMS REFERABLE JOINT SITE UNSPECIFIED 10/23/2010  . CARPAL TUNNEL SYNDROME, HX OF 04/05/2009  . Leukocytopenia 05/02/2008  . DRY MOUTH 04/28/2008  . INSOMNIA, TRANSIENT 04/27/2007  . Osteopenia 04/27/2007  . Hyperlipidemia 04/23/2007  . HIATAL HERNIA 04/23/2007  . POSTMENOPAUSAL STATUS 04/23/2007  . RHEUMATOID FACTOR, POSITIVE 04/23/2007    Claris Pong 01/09/2021, 12:22 PM  [place], [place], [address] Phone: [phone]   Fax:  [phone]  Name: COLENA KETTERMAN MRN: [id number] Date of Birth: 1946-03-19  Cheral Almas, PT 01/09/21 12:23 PM

## 2021-01-18 ENCOUNTER — Other Ambulatory Visit: Payer: Self-pay

## 2021-01-18 ENCOUNTER — Ambulatory Visit: Payer: Medicare Other | Attending: General Surgery

## 2021-01-18 DIAGNOSIS — R293 Abnormal posture: Secondary | ICD-10-CM | POA: Insufficient documentation

## 2021-01-18 DIAGNOSIS — M25612 Stiffness of left shoulder, not elsewhere classified: Secondary | ICD-10-CM | POA: Insufficient documentation

## 2021-01-18 DIAGNOSIS — R600 Localized edema: Secondary | ICD-10-CM | POA: Insufficient documentation

## 2021-01-18 DIAGNOSIS — Z170 Estrogen receptor positive status [ER+]: Secondary | ICD-10-CM | POA: Insufficient documentation

## 2021-01-18 DIAGNOSIS — Z483 Aftercare following surgery for neoplasm: Secondary | ICD-10-CM | POA: Diagnosis present

## 2021-01-18 DIAGNOSIS — C50412 Malignant neoplasm of upper-outer quadrant of left female breast: Secondary | ICD-10-CM | POA: Diagnosis present

## 2021-01-18 NOTE — Therapy (Signed)
[place], [place], [address] Phone: [phone]   Fax:  [phone]  Physical Therapy Treatment  Patient Details  Name: Jennifer Sellers MRN: [id number] Date of Birth: 1946-01-30 Referring Provider (PT): Dr. Autumn Messing   Encounter Date: 01/18/2021   PT End of Session - 01/18/21 0947    Visit Number 18    Number of Visits 18    Date for PT Re-Evaluation 01/18/21    PT Start Time 0903    PT Stop Time 0945    PT Time Calculation (min) 42 min    Activity Tolerance Patient tolerated treatment well    Behavior During Therapy [facility] for tasks assessed/performed           Past Medical History:  Diagnosis Date  . Arthritis    rheumatoid arthritis  . Hypothyroidism   . Leukopenia   . Thyroid disease   . TIA (transient ischemic attack) 2011    Past Surgical History:  Procedure Laterality Date  . ABDOMINAL HYSTERECTOMY    . BREAST LUMPECTOMY WITH RADIOACTIVE SEED AND SENTINEL LYMPH NODE BIOPSY Left 10/05/2020   Procedure: LEFT BREAST LUMPECTOMY WITH RADIOACTIVE SEED AND SENTINEL LYMPH NODE BIOPSY;  Surgeon: Jovita Kussmaul, MD;  Location: [facility];  Service: General;  Laterality: Left;  GENERAL AND PECTORAL BLOCK  . CARPAL TUNNEL RELEASE    . COLONOSCOPY    . COLONOSCOPY WITH PROPOFOL N/A 06/30/2018   Procedure: COLONOSCOPY WITH PROPOFOL;  Surgeon: Manya Silvas, MD;  Location: [facility] ENDOSCOPY;  Service: Endoscopy;  Laterality: N/A;  . colpo-ureteroscopy    . lens surgery      There were no vitals filed for this visit.   Subjective Assessment - 01/18/21 0903    Subjective My physical self is doing well, but I just can't get anything done.  No more shooting pains in left breast but it doesn't always feel normal.  Nipple still feels a little tender from the radiation.  She reports she has been doing the MLD but isn't always sure she is doing it right.    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 Patient underwent a left lumpectomy and sentinel node biopsy (3 negative nodes) on 10/05/2020. It is ER/PR positive and HER2 negative with a Ki67 of 10%.    Patient Stated Goals Be checked for Discharge    Currently in Pain? No/denies    Pain Score 0-No pain              [facility] PT Assessment - 01/18/21 0001      Assessment   Medical Diagnosis s/p left lumpectomy and SLNB    Referring Provider (PT) Dr. Autumn Messing    Onset Date/Surgical Date 10/05/20      AROM   Left Shoulder Extension 70 Degrees    Left Shoulder Flexion 165 Degrees    Left Shoulder ABduction 174 Degrees             LYMPHEDEMA/ONCOLOGY QUESTIONNAIRE - 01/18/21 0001      Right Upper Extremity Lymphedema   10 cm Proximal to Olecranon Process 24.3 cm    Olecranon Process 23.6 cm    15 cm Proximal to Ulnar Styloid Process 20.1 cm    10 cm Proximal to Ulnar Styloid Process 16.7 cm    Just Proximal to Ulnar Styloid Process 14.5 cm    Across Hand at [employer] 19.4 cm  At [facility] of 2nd Digit 5.9 cm                      [facility] Adult PT Treatment/Exercise - 01/18/21 0001      Manual Therapy   Soft tissue mobilization to area of lumpectomy scar to soften area and decrease scar tissue    Myofascial Release To area of cording at left axilla and antecubital fossa    Manual Lymphatic Drainage (MLD) in supine : short neck, 5 diaphragmatic breaths, left axillary nodes, left inguinal nodes and establishment of axillo inguinal pathway, left breast moving fluid towards pathways then retracing all steps. Reviewed and practiced all with pt.    Passive ROM PROM left shoulder flex and abduction                       PT T[place]z Term Goals - 01/18/21 0951      PT Naval TERM GOAL #1   Title Patient will demonstrate she has regained full shoulder ROM and function post operatively compared to baselines.    Time 4     Period Weeks    Status Achieved      PT Lemley TERM GOAL #2   Title Patient will report she is able to lay in the required postion for radiation without c/o increaed pain.    Time 4    Period Weeks    Status Achieved      PT Ellers TERM GOAL #3   Title Patient will improve her DASH score to be </= 13.64 for improved overall arm function and to achieve baseline score.    Baseline pt contines with higher score not because she IS limited, but because she is concerned about over doing it.    Time 4    Period Weeks    Status Unable to assess   forgotten today     PT Priest TERM GOAL #4   Title Patient will improve left shoulder active flexion and abduction to be >/= 140 degrees for increased ease reaching.    Time 4    Period Weeks    Status Achieved      PT Mattie TERM GOAL #5   Title Patient will verbalize good understanding of lymphedema risk reduction practices.    Time 4    Period Weeks    Status Partially Met   pt requires occasional cueing     PT Bazzano TERM GOAL #6   Title Pt will be fit for prophylactic sleeve and gauntlet and will be able to don/doff independently and will be educated in proper times to wear    Time 2    Period Weeks    Status Achieved      PT Billingham TERM GOAL #7   Title Pts. compression bra will fit properly to enable reduction in edema    Time 3    Period Weeks    Status Achieved      PT Bargar TERM GOAL #8   Title Pt will be independent in self breast MLD and scar massage to reduce fibrosis/edema    Time 4    Period Weeks    Status Partially Met   requires occasional VC's                Plan - 01/18/21 0948    Clinical Impression Statement Therapy consisted of remeasuring of left shoulder ROM, and circuference of left UE, Review of Self MLD, warning signs of infection,  PROM to left shoulder and anaswering pt questions.  Pt does fairly well with MLD but does require intermittent VC's because she forgets at times.  Breast swelling continues to do well  and she is compliant with chip pack and compression bra. She does perform MLD at home but requires occasional cues when here.  She has achieved most goals established at initial evaluation.    Rehab Potential Excellent    PT Frequency 1x / week    PT Duration 4 weeks    PT Treatment/Interventions ADLs/Self Care Home Management;Therapeutic exercise;Patient/family education;Manual techniques;Manual lymph drainage;Passive range of motion;Scar mobilization    PT Next Visit Plan pt discharged to HEP    PT Home Exercise Plan Post op shoulder ROM HEP and closed chain flexion and abduction, Self MLD, Pt advised to call with questions or concerns    Consulted and Agree with Plan of Care Patient           Patient will benefit from skilled therapeutic intervention in order to improve the following deficits and impairments:  Postural dysfunction,Decreased range of motion,Decreased knowledge of precautions,Impaired UE functional use,Pain,Increased edema,Decreased scar mobility,Increased fascial restricitons  Visit Diagnosis: Stiffness of left shoulder, not elsewhere classified  Localized edema  Aftercare following surgery for neoplasm  Abnormal posture  Malignant neoplasm of upper-outer quadrant of left breast in female, estrogen receptor positive ([place])     Problem List Patient Active Problem List   Diagnosis Date Noted  . Malignant neoplasm of upper-outer quadrant of left breast in female, estrogen receptor positive ([place]) 08/24/2020  . Estrogen deficiency 05/24/2019  . Vitamin D deficiency 08/03/2015  . Routine general medical examination at a health care facility 05/15/2015  . Dyspepsia 08/23/2014  . Abnormal ankle brachial index 08/23/2014  . Rheumatoid arthritis ([place]) 06/19/2014  . Thrombocytopenia ([place]) 06/19/2014  . Encounter for Medicare annual wellness exam 11/11/2013  . Hyperbilirubinemia 08/19/2013  . Hypothyroid 09/21/2012  . NEOPLASMS UNSPEC NATURE BONE SOFT TISSUE&SKIN  10/23/2010  . 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 OTHER SYMPTOMS REFERABLE JOINT SITE UNSPECIFIED 10/23/2010  . CARPAL TUNNEL SYNDROME, HX OF 04/05/2009  . Leukocytopenia 05/02/2008  . DRY MOUTH 04/28/2008  . INSOMNIA, TRANSIENT 04/27/2007  . Osteopenia 04/27/2007  . Hyperlipidemia 04/23/2007  . HIATAL HERNIA 04/23/2007  . POSTMENOPAUSAL STATUS 04/23/2007  . RHEUMATOID FACTOR, POSITIVE 04/23/2007   PHYSICAL THERAPY DISCHARGE SUMMARY  Visits from Start of Care: 18  Current functional level related to goals / functional outcomes: Pt has achieved most goals established.  She does require occasional VC's due to slight memory deficits  Remaining deficits: NA   Education / Equipment: Compression bra/sleeve and gauntlet(prophylactic  Plan: Patient agrees to discharge.  Patient goals were partially met. Patient is being discharged due to being pleased with the current functional level.  ?????      Claris Pong 01/18/2021, 9:56 AM  [place], [place], [address] Phone: [phone]   Fax:  [phone]  Name: AUNA MIKKELSEN MRN: [id number] Date of Birth: [birth year]  Cheral Almas, PT 01/18/21 9:57 AM

## 2021-01-23 ENCOUNTER — Telehealth: Payer: Self-pay | Admitting: Family Medicine

## 2021-01-23 NOTE — Telephone Encounter (Signed)
We can only due  labs, but will route to PCP to see if she wants to re-order labs oncologist will order or keep it 2 separate lab appt. Also not sure what oncologist will order vs what PCP is ordering for CPE, please review

## 2021-01-23 NOTE — Telephone Encounter (Signed)
Called pt and no answer and no VM (kept ringing) 

## 2021-01-23 NOTE — Telephone Encounter (Signed)
Please let her know that I will need to be contacted by her oncologist/given orders with codes that they need  If not until august, we have a while

## 2021-01-23 NOTE — Telephone Encounter (Signed)
Pt called in wanted to know about lab work due to she is having labs on 7/11 and labs with her oncologist on 7/21 wanted to know if they can draw the labs on that day.

## 2021-01-24 NOTE — Telephone Encounter (Signed)
Patient stated that she will call her oncologist and see if she can reach out to Dr. Glori Bickers to get the labs ordered so she can have them all drawn at our office on 7/11.

## 2021-01-25 ENCOUNTER — Telehealth: Payer: Self-pay

## 2021-01-25 NOTE — Telephone Encounter (Signed)
Fax received from Dr. Truitt Merle requesting our office draw a CBC and CMP during lab draw on 05/27/21 per patient request.

## 2021-01-25 NOTE — Telephone Encounter (Signed)
That should be ok, she needs to remind [place] at that time of draw

## 2021-01-28 ENCOUNTER — Ambulatory Visit: Payer: Self-pay

## 2021-01-28 ENCOUNTER — Telehealth: Payer: Self-pay | Admitting: Family Medicine

## 2021-01-28 NOTE — Telephone Encounter (Signed)
Patient is requesting a call back about the [employer] stating Dr.Tower isn't in network.  Patient is very upset.  She said she called [employer] and they said the letter was correct. Please call patient back.

## 2021-01-29 NOTE — Telephone Encounter (Signed)
Left VM letting pt know

## 2021-01-30 NOTE — Telephone Encounter (Signed)
[place] Night - Client Nonclinical Telephone Record AccessNurse Client [place] Night - Client Client Site [place] Physician Tower, Roque Lias - MD Contact Type Call Who Is Calling Patient / Member / Family / Caregiver Caller Name Jennifer Sellers Phone Number [phone] Call Type Message Only Information Provided Reason for Call Returning a Call from the Office Initial [place] states missed a call from Dr. Marliss Coots nurse Disp. Time Disposition Final User 01/29/2021 5:04:19 PM General Information Provided Yes Kenton Kingfisher, Lanette Call Closed By: Nelia Shi Transaction Date/Time: 01/29/2021 5:02:03 PM (ET)

## 2021-01-30 NOTE — Telephone Encounter (Signed)
Pt returned call. I let her know that we received the fax from Dr. Burr Medico and to let the lab know on the day of her appt that we are drawing labs for his office as well. Pt verbalized understanding.

## 2021-01-30 NOTE — Telephone Encounter (Signed)
I called and discussed with patient. I let her know that this is being investigated. I explained to her that it has only been Dr. Marliss Coots patients receiving this letter and it doesn't seem correct, but I will follow up with her once I get an answer.

## 2021-01-30 NOTE — Telephone Encounter (Signed)
[place] Night - Client Nonclinical Telephone Record AccessNurse Client [place] Night - Client Client Site [place] Physician Tower, Roque Lias - MD Contact Type Call Who Is Calling Patient / Member / Family / Caregiver Caller Name Eurydice Calixto Phone Number [phone] Call Type Message Only Information Provided Reason for Call Returning a Call from the Office Initial [place] states she is returning a call to nurse [place] Additional Comment [phone]) Was notified from insurance that her doctor is not taking her insurance anymore. Want to know if that is true. Also needing blood work done. Disp. Time Disposition Final User 01/29/2021 5:15:37 PM General Information Provided Yes Chuck Hint Call Closed By: Chuck Hint Transaction Date/Time: 01/29/2021 5:09:26 PM (ET)

## 2021-01-30 NOTE — Telephone Encounter (Signed)
See 01/25/21 and 01/28/21 phone notes. Sending to [employer] [place] office mgr.

## 2021-02-12 NOTE — Telephone Encounter (Signed)
Lvm asking patient to call office. I have received confirmation that Dr. Glori Bickers is still in network. The letters are still being investigated as to why they went out, but patient can continue to see Dr. Glori Bickers.

## 2021-02-25 ENCOUNTER — Telehealth: Payer: Self-pay

## 2021-02-25 NOTE — Telephone Encounter (Signed)
Jennifer Sellers left vm message with [facility] regarding bilateral shoulder pain.  I returned Jennifer Sellers's call.  She is having intermittent bilateral shoulder pain with "grinding" sounds.  This waxes and wanes.  She is wondering if this is coming form the tamoxifen.  Per Dr Burr Medico, I told her she can stop the tamoxifen for 2 weeks to see if the pain improves.  I told her there are other AI that can be prescribed.  She has a survivorship appt with Lacie 4/27 at 1230.  She verbalized understanding. And would thing about our suggestion

## 2021-02-28 ENCOUNTER — Ambulatory Visit: Payer: Medicare Other

## 2021-03-04 ENCOUNTER — Other Ambulatory Visit: Payer: Self-pay | Admitting: Hematology

## 2021-03-06 ENCOUNTER — Other Ambulatory Visit: Payer: Self-pay

## 2021-03-06 DIAGNOSIS — Z170 Estrogen receptor positive status [ER+]: Secondary | ICD-10-CM

## 2021-03-06 DIAGNOSIS — C50412 Malignant neoplasm of upper-outer quadrant of left female breast: Secondary | ICD-10-CM

## 2021-03-06 MED ORDER — TAMOXIFEN CITRATE 20 MG PO TABS: 20.0000 mg | ORAL_TABLET | Freq: Every day | ORAL | 6 refills | Status: DC

## 2021-03-08 ENCOUNTER — Telehealth: Payer: Self-pay

## 2021-03-08 NOTE — Telephone Encounter (Signed)
That test is not approved for screening (not predictive of alzheimer's).  Per my understanding, it is a diagnostic tool for a symptomatic patient who is being treated by alzheimer's (usually under neurology care).   Good question, though.

## 2021-03-08 NOTE — Telephone Encounter (Signed)
Pt called to see if she could have a test C2N for alzheimer added to her blood work in July...Marland Kitchen please advise

## 2021-03-11 NOTE — Telephone Encounter (Signed)
Pt notified of Dr. Marliss Coots comments. Pt said her memory is very bad and she's getting very forgetful and she read an article saying this test would help her know if she has alzheimer's. Pt wanted to see if she needs a separate appt to discuss her memory or if she can let PCP know how bad it is at her CPE appt in July, but its a concern that she is worried about

## 2021-03-11 NOTE — Telephone Encounter (Signed)
Please schedule a visit before then if possible, thanks

## 2021-03-12 NOTE — Telephone Encounter (Signed)
Left VM requesting pt to call back and schedule a separate appt to discuss memory before her CPE in July

## 2021-03-13 ENCOUNTER — Encounter: Payer: Self-pay | Admitting: Nurse Practitioner

## 2021-03-13 ENCOUNTER — Inpatient Hospital Stay: Payer: Medicare Other | Attending: Nurse Practitioner | Admitting: Nurse Practitioner

## 2021-03-13 ENCOUNTER — Other Ambulatory Visit: Payer: Self-pay

## 2021-03-13 VITALS — BP 125/73 | HR 71 | Temp 97.8°F | Resp 17 | Ht 67.0 in | Wt 140.3 lb

## 2021-03-13 DIAGNOSIS — Z170 Estrogen receptor positive status [ER+]: Secondary | ICD-10-CM | POA: Diagnosis not present

## 2021-03-13 DIAGNOSIS — C50412 Malignant neoplasm of upper-outer quadrant of left female breast: Secondary | ICD-10-CM | POA: Diagnosis present

## 2021-03-13 DIAGNOSIS — Z8673 Personal history of transient ischemic attack (TIA), and cerebral infarction without residual deficits: Secondary | ICD-10-CM | POA: Diagnosis not present

## 2021-03-13 DIAGNOSIS — Z923 Personal history of irradiation: Secondary | ICD-10-CM | POA: Diagnosis not present

## 2021-03-13 DIAGNOSIS — Z79810 Long term (current) use of selective estrogen receptor modulators (SERMs): Secondary | ICD-10-CM | POA: Diagnosis not present

## 2021-03-13 DIAGNOSIS — I890 Lymphedema, not elsewhere classified: Secondary | ICD-10-CM | POA: Insufficient documentation

## 2021-03-13 DIAGNOSIS — E079 Disorder of thyroid, unspecified: Secondary | ICD-10-CM | POA: Diagnosis not present

## 2021-03-13 DIAGNOSIS — M069 Rheumatoid arthritis, unspecified: Secondary | ICD-10-CM | POA: Insufficient documentation

## 2021-03-13 NOTE — Progress Notes (Signed)
CLINIC:  Survivorship   Patient Care Team: Tower, Wynelle Fanny, MD as PCP - General Lad, Caesar Bookman, MD as Referring Physician (Ophthalmology) Birder Robson, MD as Referring Physician (Ophthalmology) Worthy Keeler, DC as Consulting Physician (Chiropractic Medicine) Mauro Kaufmann, RN as Oncology Nurse Navigator Rockwell Germany, RN as Oncology Nurse Navigator Jovita Kussmaul, MD as Consulting Physician (General Surgery) Truitt Merle, MD as Consulting Physician (Hematology) Kyung Rudd, MD as Consulting Physician (Radiation Oncology) Alla Feeling, NP as Nurse Practitioner (Nurse Practitioner)  REASON FOR VISIT:  Routine follow-up post-treatment for a recent history of breast cancer.  BRIEF ONCOLOGIC HISTORY:  Oncology History Overview Note  Cancer Staging Malignant neoplasm of upper-outer quadrant of left breast in female, estrogen receptor positive ([place]) Staging form: Breast, AJCC 8th Edition - Clinical stage from 08/29/2020: Stage IA (cT1c, cN0, cM0, G1, ER+, PR+, HER2-) - Unsigned - Pathologic stage from 10/31/2020: Stage IA (pT2, pN0(sn), cM0, G1, ER+, PR+, HER2-, Oncotype DX score: 12) - Unsigned    Malignant neoplasm of upper-outer quadrant of left breast in female, estrogen receptor positive ([place])  08/02/2020 Mammogram   IMPRESSION The 1cm (0.9x1.2x0.9cm on US)asymmetry in the left breast at 1:00 position, 4 cmfn is indeterminate   08/22/2020 Initial Biopsy   Diagnosis 08/22/20 Breast, left, needle core biopsy, 1 o'clock, 4cmfn -INVASIVE MAMMARY CARCINOMA -SEE COMMENT       -Grade 1 or 3  E-cadherin is NEGATIVE supporting lobular origin   08/22/2020 Receptors her2   ER - 95%  PR - 25%  Ki67 - 10% HER2 Negative on [facility]   08/24/2020 Initial Diagnosis   Malignant neoplasm of upper-outer quadrant of left breast in female, estrogen receptor positive ([place])   08/29/2020 Breast MRI   IMPRESSION: 1. Enhancing mass in the upper slightly outer left breast  measuring approximately 2.4 x 1.6 x 1.8 cm, consistent with the patient's known malignancy. 2. No MRI evidence of malignancy in the right breast. 3. No suspicious lymphadenopathy.   10/05/2020 Surgery   LEFT BREAST LUMPECTOMY WITH RADIOACTIVE SEED AND SENTINEL LYMPH NODE BIOPSY by Dr Marlou Starks    10/05/2020 Pathology Results   FINAL MICROSCOPIC DIAGNOSIS:   A. LYMPH NODE, LEFT AXILLARY #1, SENTINEL, EXCISION:  -  No carcinoma identified in one lymph node (0/1)  -  See comment   B. LYMPH NODE, LEFT AXILLARY, SENTINEL, EXCISION:  -  No carcinoma identified in one lymph node (0/1)  -  See comment   C. LYMPH NODE, LEFT AXILLARY #2, SENTINEL, EXCISION:  -  No carcinoma identified in one lymph node (0/1)  -  See comment   D. BREAST, LEFT, LUMPECTOMY:  -  Invasive lobular carcinoma, Nottingham grade 1 of 3, 2.3 cm  -  Margins uninvolved by carcinoma (<0.1 cm; superior margin)  -  Previous biopsy site changes present  -  See oncology table and comment below    10/05/2020 Oncotype testing   Oncoytpe  Recurrence Score 12 with distant recurrence risk at 9 years with AI or Tamoxifen at 3%  There is less than 1% benefit of chemotherapy.    11/19/2020 - 12/14/2020 Radiation Therapy   Adjuvant Radiation with Dr Lisbeth Renshaw    12/2020 -  Anti-estrogen oral therapy   Tamoxifen $RemoveBe'20mg'howNZuLiz$  daily starting 12/2020    03/13/2021 Survivorship   SCP delivered by Cira Rue, NP     INTERVAL HISTORY:  Jennifer Sellers presents to the [facility] today for our initial meeting to review her survivorship care plan detailing  her treatment course for breast cancer, as well as monitoring [place]-term side effects of that treatment, education regarding health maintenance, screening, and overall wellness and health promotion.     Overall, Jennifer Sellers reports feeling pretty well overall.  She developed intermittent shoulder aches after she started tamoxifen which improved when she held it temporarily.  She figured she needed to  be on the antiestrogen so she restarted and became sore again this past week.  Aches are mild, intermittent with movement.  She does have existing arthritis.  She can remain functional.  She does lymphedema exercises for the left breast, denies new lump/mass, nipple discharge, or skin change.  Denies hot flashes.  She does have mood and balance, notes feeling like a "basket case" recently she attributes somewhat to her husband's health issues.  He declined social work referral or discussion of medication.  She denies fever, chills, cough, chest pain, dyspnea, signs of thrombosis, or bleeding.   ONCOLOGY TREATMENT TEAM:  1. Surgeon:  Dr. Marlou Starks at [facility] 2. Medical Oncologist: Dr. Burr Medico 3. Radiation Oncologist: Dr. Lisbeth Renshaw    PAST MEDICAL/SURGICAL HISTORY:  Past Medical History:  Diagnosis Date  . Arthritis    rheumatoid arthritis  . Hypothyroidism   . Leukopenia   . Thyroid disease   . TIA (transient ischemic attack) 2011   Past Surgical History:  Procedure Laterality Date  . ABDOMINAL HYSTERECTOMY    . BREAST LUMPECTOMY WITH RADIOACTIVE SEED AND SENTINEL LYMPH NODE BIOPSY Left 10/05/2020   Procedure: LEFT BREAST LUMPECTOMY WITH RADIOACTIVE SEED AND SENTINEL LYMPH NODE BIOPSY;  Surgeon: Jovita Kussmaul, MD;  Location: [place];  Service: General;  Laterality: Left;  GENERAL AND PECTORAL BLOCK  . CARPAL TUNNEL RELEASE    . COLONOSCOPY    . COLONOSCOPY WITH PROPOFOL N/A 06/30/2018   Procedure: COLONOSCOPY WITH PROPOFOL;  Surgeon: Manya Silvas, MD;  Location: [facility] ENDOSCOPY;  Service: Endoscopy;  Laterality: N/A;  . colpo-ureteroscopy    . lens surgery       ALLERGIES:  Allergies  Allergen Reactions  . Cipro [Ciprofloxacin Hcl] Other (See Comments)    Unknown   . Cortisporin [Bacitra-Neomycin-Polymyxin-Hc]     Burning / swelling of eyes with eye drops  . Prevnar [Pneumococcal 13-Val Conj Vacc] Other (See Comments)    Severe local reaction   .  Sulfonamide Derivatives     REACTION: reaction not known     CURRENT MEDICATIONS:  Outpatient Encounter Medications as of 03/13/2021  Medication Sig  . Digestive Aids Mixture (DIGESTION GB) CAPS Take 1 capsule by mouth daily.  Marland Kitchen levothyroxine (SYNTHROID) 50 MCG tablet TAKE 1 TABLET EVERY DAY AND 2 ON SUNDAY  . Multiple Vitamins-Minerals (PRESERVISION AREDS 2 PO) Take 2 capsules by mouth daily.   . NON FORMULARY 2 drops daily. sustane  . OVER THE COUNTER MEDICATION Optimal longevi D K2  . OVER THE COUNTER MEDICATION Liqua A  . tamoxifen (NOLVADEX) 20 MG tablet Take 1 tablet (20 mg total) by mouth daily.  . Cholecalciferol (OPTIMAL-D PO) Take 1 tablet by mouth daily. (Patient not taking: Reported on 03/13/2021)  . HYDROcodone-acetaminophen (NORCO/VICODIN) 5-325 MG tablet Take 1-2 tablets by mouth every 6 (six) hours as needed for moderate pain or severe pain. (Patient not taking: Reported on 03/13/2021)  . [DISCONTINUED] NON FORMULARY Take 1 tablet by mouth daily. Dietary supplement  . [DISCONTINUED] OVER THE COUNTER MEDICATION Take 1 tablet by mouth daily. opti-methyl-b  . [DISCONTINUED] OVER THE COUNTER MEDICATION Osteo-mins  PM with D+K1+K2  . [DISCONTINUED] OVER THE COUNTER MEDICATION Osteo mins pm calcium  . [DISCONTINUED] OVER THE COUNTER MEDICATION Thore Pic Mins   No facility-administered encounter medications on file as of 03/13/2021.     ONCOLOGIC FAMILY HISTORY:  Family History  Problem Relation Age of Onset  . Cancer Mother        lymphoma  . Cancer Brother        lymphoma  . Cancer Daughter        NHL     GENETIC COUNSELING/TESTING: N/A  SOCIAL HISTORY:  Sonnia Strong Malek is married and lives with her spouse.  She denies any current or history of tobacco or illicit drug use.  He drinks wine occasionally.   PHYSICAL EXAMINATION:  Vital Signs:   Vitals:   03/13/21 1244  BP: 125/73  Pulse: 71  Resp: 17  Temp: 97.8 F (36.6 C)  SpO2: 100%   Filed Weights    03/13/21 1244  Weight: 140 lb 4.8 oz (63.6 kg)   General: Well-nourished, well-appearing female in no acute distress.   HEENT:  Sclerae anicteric.  Lymph: No cervical, supraclavicular, or infraclavicular lymphadenopathy noted on palpation.  Respiratory:  breathing non-labored.  Neuro: No focal deficits. Steady gait.  Psych: Mood and affect normal and appropriate for situation.  Extremities: No edema. MSK: No focal spinal tenderness to palpation.  Full range of motion in bilateral upper extremities Skin: Warm and dry. Breast exam: Breasts are symmetric without nipple discharge or inversion.  S/p left lumpectomy.  Incisions completely healed.  No palpable mass in either breast or axilla that I could appreciate.  Prominent anterior ribs right > left  LABORATORY DATA:  None for this visit.  DIAGNOSTIC IMAGING:  None for this visit.      ASSESSMENT AND PLAN:  Ms.. Sellers is a pleasant 75 y.o. 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  She presents to the Survivorship Clinic for our initial meeting and routine follow-up post-completion of treatment for breast cancer.    1. Stage 1A left breast cancer:  Ms. Khurana has recovered well from dinitive treatment for breast cancer. She will follow-up with her medical oncologist, Dr. Burr Medico in 05/2021 with history and physical exam per surveillance protocol.  She will continue her anti-estrogen therapy with tamoxifen. Thus far, she is tolerating well, with minimal side effects mainly bilateral shoulder aches.  We reviewed symptom management.  She was instructed to make Dr. Burr Medico or myself aware if she begins to experience any worsening side effects of the medication and I could see her back in clinic to help manage those side effects, as needed.  Today's breast exam is benign, no concern for  recurrence. Today, a comprehensive survivorship care plan and treatment summary was reviewed with the patient today detailing her breast cancer diagnosis, treatment course, potential late/[place]-term effects of treatment, appropriate follow-up care with recommendations for the future, and patient education resources.  A copy of this summary, along with a letter will be sent to the patient's primary care provider via In Basket message after today's visit.   2.  Shoulder aches: She had existing arthritis prior to tamoxifen but developed shoulder aches after starting.  She held tamoxifen temporarily and shoulder aches nearly resolved.  She knew she needed the medication and went back on antiestrogen, shoulder pain returned soon after.  Pain is mild and intermittent,  she is able to remain functional.  She is willing to tolerate.  We discussed symptom management.  She will let me know if pain worsens or fails to improve or becomes a quality of life limiting factor.  She is not sleeping well, mainly due to stress.  We discussed sleep hygiene.  She declined referral to social work or medication for mood today.  3. Bone health:  Given Ms. Greathouse's age/history of breast cancer and history of osteopenia, she should have DEXA every 2 years, can be done 06/2021 or in November with next mammogram.  We reviewed the bone strengthening quality of tamoxifen.  In the meantime, she was encouraged to increase her consumption of foods rich in calcium, as well as increase her weight-bearing activities.  She was given education on specific activities to promote bone health.  4. Cancer screening:  Due to Ms. Miera's history and her age, she should receive screening for skin cancers and colon cancer; she is s/p hysterectomy. The information and recommendations are listed on the patient's comprehensive care plan/treatment summary and were reviewed in detail with the patient.    5. Health maintenance and wellness promotion: Ms. Pennella was  encouraged to consume 5-7 servings of fruits and vegetables per day. We reviewed the "Nutrition Rainbow" handout, as well as the handout "Take Control of Your Health and Reduce Your Cancer Risk" from the [place].  She was also encouraged to engage in moderate to vigorous exercise for 30 minutes per day most days of the week. We discussed the [employer] fitness program, which is designed for cancer survivors to help them become more physically fit after cancer treatments.  She was instructed to limit her alcohol consumption and continue to abstain from tobacco use.   6. Support services/counseling: It is not uncommon for this period of the patient's cancer care trajectory to be one of many emotions and stressors.  We discussed an opportunity for her to participate in the next session of [facility] ("Finding Your New Normal") support group series designed for patients after they have completed treatment.   Ms. Lobb was encouraged to take advantage of our many other support services programs, support groups, and/or counseling in coping with her new life as a cancer survivor after completing anti-cancer treatment.  She was offered support today through active listening and expressive supportive counseling.  She was given information regarding our available services and encouraged to contact me with any questions or for help enrolling in any of our support group/programs.    Dispo:   -Return to cancer center 06/06/2021 -Mammogram due in 09/2021 with DEXA -Follow up with surgery as indicated -She is welcome to return back to the Survivorship Clinic at any time; no additional follow-up needed at this time.  -Consider referral back to survivorship as a [place]-term survivor for continued surveillance  A total of (30) minutes of face-to-face time was spent with this patient with greater than 50% of that time in counseling and care-coordination.   Cira Rue, NP Survivorship Program [facility] [phone]   Note: PRIMARY CARE PROVIDER Abner Greenspan, [place] [phone]

## 2021-03-14 NOTE — Telephone Encounter (Signed)
Called pt again and no answer and no option to leave VM this time

## 2021-03-26 NOTE — Telephone Encounter (Signed)
Pt is aware as instructed... pt reports she read that this could be related to cancer and will f/u later if she changes her mind

## 2021-03-26 NOTE — Telephone Encounter (Signed)
Called 3rd time and no answer so per DPR left VM letting pt know Dr. Marliss Coots instructions and to call and schedule an appt to discuss memory

## 2021-04-08 ENCOUNTER — Ambulatory Visit: Payer: Medicare Other | Attending: General Surgery

## 2021-04-08 ENCOUNTER — Other Ambulatory Visit: Payer: Self-pay

## 2021-04-08 DIAGNOSIS — Z483 Aftercare following surgery for neoplasm: Secondary | ICD-10-CM | POA: Insufficient documentation

## 2021-04-08 NOTE — Therapy (Signed)
[place], [place], [address] Phone: [phone]   Fax:  [phone]  Physical Therapy Treatment  Patient Details  Name: Jennifer Sellers MRN: [id number] Date of Birth: 12/21/1945 Referring Provider (PT): Dr. Autumn Messing   Encounter Date: 04/08/2021   PT End of Session - 04/08/21 0938    Visit Number 18   # unchanged due to screen only   Number of Visits 18    PT Start Time 0928    PT Stop Time 0955    PT Time Calculation (min) 27 min    Activity Tolerance Patient tolerated treatment well    Behavior During Therapy [facility] for tasks assessed/performed           Past Medical History:  Diagnosis Date  . Arthritis    rheumatoid arthritis  . Hypothyroidism   . Leukopenia   . Thyroid disease   . TIA (transient ischemic attack) 2011    Past Surgical History:  Procedure Laterality Date  . ABDOMINAL HYSTERECTOMY    . BREAST LUMPECTOMY WITH RADIOACTIVE SEED AND SENTINEL LYMPH NODE BIOPSY Left 10/05/2020   Procedure: LEFT BREAST LUMPECTOMY WITH RADIOACTIVE SEED AND SENTINEL LYMPH NODE BIOPSY;  Surgeon: Jovita Kussmaul, MD;  Location: [place];  Service: General;  Laterality: Left;  GENERAL AND PECTORAL BLOCK  . CARPAL TUNNEL RELEASE    . COLONOSCOPY    . COLONOSCOPY WITH PROPOFOL N/A 06/30/2018   Procedure: COLONOSCOPY WITH PROPOFOL;  Surgeon: Manya Silvas, MD;  [facility] END[facility]COPY;  Service: Endoscopy;  Laterality: N/A;  . colpo-ureteroscopy    . lens surgery      There were no vitals filed for this visit.   Subjective Assessment - 04/08/21 0933    Subjective Pt returns for her 3 month L-Dex screen.    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 Patient underwent a left lumpectomy and sentinel node biopsy (3 negative nodes) on 10/05/2020. It is ER/PR positive and HER2 negative with a Ki67 of 10%.                   L-DEX FLOWSHEETS - 04/08/21 0900      L-DEX LYMPHEDEMA SCREENING   Measurement Type Unilateral    L-DEX MEASUREMENT EXTREMITY Upper Extremity    POSITION  Standing    DOMINANT SIDE Right    At Risk Side Left    BASELINE SCORE (UNILATERAL) 1.3    L-DEX SCORE (UNILATERAL) 10.6    VALUE CHANGE (UNILAT) 9.3                                  PT [place]er Term Goals - 01/18/21 0951      PT Antony TERM GOAL #1   Title Patient will demonstrate she has regained full shoulder ROM and function post operatively compared to baselines.    Time 4    Period Weeks    Status Achieved      PT Browe TERM GOAL #2   Title Patient will report she is able to lay in the required postion for radiation without c/o increaed pain.    Time 4    Period Weeks    Status Achieved      PT Komar TERM GOAL #3   Title Patient will improve her DASH score to be </= 13.64 for improved overall arm function  and to achieve baseline score.    Baseline pt contines with higher score not because she IS limited, but because she is concerned about over doing it.    Time 4    Period Weeks    Status Unable to assess   forgotten today     PT Rietz TERM GOAL #4   Title Patient will improve left shoulder active flexion and abduction to be >/= 140 degrees for increased ease reaching.    Time 4    Period Weeks    Status Achieved      PT Merkel TERM GOAL #5   Title Patient will verbalize good understanding of lymphedema risk reduction practices.    Time 4    Period Weeks    Status Partially Met   pt requires occasional cueing     PT Stairs TERM GOAL #6   Title Pt will be fit for prophylactic sleeve and gauntlet and will be able to don/doff independently and will be educated in proper times to wear    Time 2    Period Weeks    Status Achieved      PT Boldin TERM GOAL #7   Title Pts. compression bra will fit properly to enable reduction in edema    Time 3    Period Weeks    Status Achieved       PT [place]illa TERM GOAL #8   Title Pt will be independent in self breast MLD and scar massage to reduce fibrosis/edema    Time 4    Period Weeks    Status Partially Met   requires occasional VC's                Plan - 04/08/21 0956    Clinical Impression Statement Pt returns for her 3 month L-Dex screen. Her change from baseline of 9.3 incdicates subclinical lymphedema so pt was measured for a compression sleeve and instrructed to begin wearing this daily for next 1 minth and return at that time to be reassessed for another L-Dex screen .Pt verbalized understanding all.    PT Next Visit Plan Pt is to return in 1 month for a follow up screen to assess new onset subclinical lymphedema.    Consulted and Agree with Plan of Care Patient           Patient will benefit from skilled therapeutic intervention in order to improve the following deficits and impairments:     Visit Diagnosis: Aftercare following surgery for neoplasm     Problem List Patient Active Problem List   Diagnosis Date Noted  . Malignant neoplasm of upper-outer quadrant of left breast in female, estrogen receptor positive ([place]) 08/24/2020  . Estrogen deficiency 05/24/2019  . Vitamin D deficiency 08/03/2015  . Routine general medical examination at a health care facility 05/15/2015  . Dyspepsia 08/23/2014  . Abnormal ankle brachial index 08/23/2014  . Rheumatoid arthritis ([place]) 06/19/2014  . Thrombocytopenia ([place]) 06/19/2014  . Encounter for Medicare annual wellness exam 11/11/2013  . Hyperbilirubinemia 08/19/2013  . Hypothyroid 09/21/2012  . NEOPLASMS UNSPEC NATURE BONE SOFT TISSUE&SKIN 10/23/2010  . 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 OTHER SYMPTOMS REFERABLE JOINT SITE UNSPECIFIED 10/23/2010  . CARPAL TUNNEL SYNDROME, HX OF 04/05/2009  . Leukocytopenia 05/02/2008  . DRY MOUTH 04/28/2008  . INSOMNIA, TRANSIENT 04/27/2007  . Osteopenia 04/27/2007  . Hyperlipidemia 04/23/2007  . HIATAL HERNIA 04/23/2007  .  POSTMENOPAUSAL STATUS 04/23/2007  . RHEUMATOID FACTOR, POSITIVE 04/23/2007    Otelia Limes, PTA  04/08/2021, 9:58 AM  [place], [place], [address] Phone: [phone]   Fax:  [phone]  Name: EURYDICE CALIXTO MRN: [id number] Date of Birth: 04/04/1946

## 2021-04-28 ENCOUNTER — Other Ambulatory Visit: Payer: Self-pay | Admitting: Family Medicine

## 2021-05-07 ENCOUNTER — Encounter: Payer: Self-pay | Admitting: Rehabilitation

## 2021-05-08 ENCOUNTER — Other Ambulatory Visit: Payer: Self-pay

## 2021-05-08 ENCOUNTER — Ambulatory Visit: Payer: Medicare Other | Attending: General Surgery

## 2021-05-08 DIAGNOSIS — Z483 Aftercare following surgery for neoplasm: Secondary | ICD-10-CM

## 2021-05-08 NOTE — Therapy (Signed)
[place], [place], [address] Phone: [phone]   Fax:  [phone]  Physical Therapy Treatment  Patient Details  Name: Jennifer Sellers MRN: [id number] Date of Birth: 05-28-1946 Referring Provider (PT): Dr. Autumn Messing   Encounter Date: 05/08/2021   PT End of Session - 05/08/21 1221     Visit Number 8   # unchanged due to screen only   Number of Visits 18    PT Start Time 7253    PT Stop Time 1218    PT Time Calculation (min) 13 min    Activity Tolerance Patient tolerated treatment well    Behavior During Therapy [facility] for tasks assessed/performed             Past Medical History:  Diagnosis Date   Arthritis    rheumatoid arthritis   Hypothyroidism    Leukopenia    Thyroid disease    TIA (transient ischemic attack) 2011    Past Surgical History:  Procedure Laterality Date   ABDOMINAL HYSTERECTOMY     BREAST LUMPECTOMY WITH RADIOACTIVE SEED AND SENTINEL LYMPH NODE BIOPSY Left 10/05/2020   Procedure: LEFT BREAST LUMPECTOMY WITH RADIOACTIVE SEED AND SENTINEL LYMPH NODE BIOPSY;  Surgeon: Jovita Kussmaul, MD;  Location: [facility];  Service: General;  Laterality: Left;  GENERAL AND PECTORAL BLOCK   CARPAL TUNNEL RELEASE     COLONOSCOPY     COLONOSCOPY WITH PROPOFOL N/A 06/30/2018   Procedure: COLONOSCOPY WITH PROPOFOL;  Surgeon: Manya Silvas, MD;  Location: [facility] ENDOSCOPY;  Service: Endoscopy;  Laterality: N/A;   colpo-ureteroscopy     lens surgery      There were no vitals filed for this visit.   Subjective Assessment - 05/08/21 1210     Subjective Pt returs for 1 month L-Dex screen after wearing compression sleeve x1 month.    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 Patient underwent a left lumpectomy and sentinel node biopsy (3 negative nodes) on 10/05/2020. It is ER/PR positive and HER2 negative  with a Ki67 of 10%.                    L-DEX FLOWSHEETS - 05/08/21 1200       L-DEX LYMPHEDEMA SCREENING   Measurement Type Unilateral    L-DEX MEASUREMENT EXTREMITY Upper Extremity    POSITION  Standing    DOMINANT SIDE Right    At Risk Side Left    BASELINE SCORE (UNILATERAL) 1.3    L-DEX SCORE (UNILATERAL) 1.1    VALUE CHANGE (UNILAT) -0.2                                    PT Mckim Term Goals - 01/18/21 6644       PT Wintle TERM GOAL #1   Title Patient will demonstrate she has regained full shoulder ROM and function post operatively compared to baselines.    Time 4    Period Weeks    Status Achieved      PT Spivey TERM GOAL #2   Title Patient will report she is able to lay in the required postion for radiation without c/o increaed pain.    Time 4    Period Weeks    Status Achieved      PT Sangiovanni TERM GOAL #3   Title  Patient will improve her DASH score to be </= 13.64 for improved overall arm function and to achieve baseline score.    Baseline pt contines with higher score not because she IS limited, but because she is concerned about over doing it.    Time 4    Period Weeks    Status Unable to assess   forgotten today     PT Dowland TERM GOAL #4   Title Patient will improve left shoulder active flexion and abduction to be >/= 140 degrees for increased ease reaching.    Time 4    Period Weeks    Status Achieved      PT Lutter TERM GOAL #5   Title Patient will verbalize good understanding of lymphedema risk reduction practices.    Time 4    Period Weeks    Status Partially Met   pt requires occasional cueing     PT Sledge TERM GOAL #6   Title Pt will be fit for prophylactic sleeve and gauntlet and will be able to don/doff independently and will be educated in proper times to wear    Time 2    Period Weeks    Status Achieved      PT Cunning TERM GOAL #7   Title Pts. compression bra will fit properly to enable reduction in edema    Time  3    Period Weeks    Status Achieved      PT Buehl TERM GOAL #8   Title Pt will be independent in self breast MLD and scar massage to reduce fibrosis/edema    Time 4    Period Weeks    Status Partially Met   requires occasional VC's                  Plan - 05/08/21 1222     Clinical Impression Statement Pt returns for her L-Dex screen after having an elevated change from baseline indicating subclinical lymphedema and having worn her compression sleeve x1 month. Her change from baseline has decreased to -0.2 showing her subclinical lymphedme ahas reversed and she no longer needs to wear her compression sleeve daily any longer. Pt was very pleased with this and isagreeable to returning to L-Dex screens every 3 months.    PT Next Visit Plan Resume every 3 momnth L-Dex screens.    Consulted and Agree with Plan of Care Patient             Patient will benefit from skilled therapeutic intervention in order to improve the following deficits and impairments:     Visit Diagnosis: Aftercare following surgery for neoplasm     Problem List Patient Active Problem List   Diagnosis Date Noted   Malignant neoplasm of upper-outer quadrant of left breast in female, estrogen receptor positive ([place]) 08/24/2020   Estrogen deficiency 05/24/2019   Vitamin D deficiency 08/03/2015   Routine general medical examination at a health care facility 05/15/2015   Dyspepsia 08/23/2014   Abnormal ankle brachial index 08/23/2014   Rheumatoid arthritis ([place]) 06/19/2014   Thrombocytopenia ([place]) 06/19/2014   Encounter for Medicare annual wellness exam 11/11/2013   Hyperbilirubinemia 08/19/2013   Hypothyroid 09/21/2012   NEOPLASMS UNSPEC NATURE BONE SOFT TISSUE&SKIN 10/23/2010   GOUTY TOPHI 10/23/2010   OTHER SYMPTOMS REFERABLE JOINT SITE UNSPECIFIED 10/23/2010   CARPAL TUNNEL SYNDROME, HX OF 04/05/2009   Leukocytopenia 05/02/2008   DRY MOUTH 04/28/2008   INSOMNIA, TRANSIENT 04/27/2007    Osteopenia 04/27/2007   Hyperlipidemia 04/23/2007  HIATAL HERNIA 04/23/2007   POSTMENOPAUSAL STATUS 04/23/2007   RHEUMATOID FACTOR, POSITIVE 04/23/2007    Otelia Limes, PTA 05/08/2021, 12:24 PM  [place], [place], [address] Phone: [phone]   Fax:  [phone]  Name: Jennifer Sellers MRN: [id number] Date of Birth: [birth year]

## 2021-05-27 ENCOUNTER — Ambulatory Visit: Payer: Medicare Other

## 2021-05-27 ENCOUNTER — Other Ambulatory Visit: Payer: Medicare Other

## 2021-05-27 ENCOUNTER — Telehealth: Payer: Self-pay | Admitting: Family Medicine

## 2021-05-27 DIAGNOSIS — E7849 Other hyperlipidemia: Secondary | ICD-10-CM

## 2021-05-27 DIAGNOSIS — D696 Thrombocytopenia, unspecified: Secondary | ICD-10-CM

## 2021-05-27 DIAGNOSIS — D72819 Decreased white blood cell count, unspecified: Secondary | ICD-10-CM

## 2021-05-27 DIAGNOSIS — E559 Vitamin D deficiency, unspecified: Secondary | ICD-10-CM

## 2021-05-27 DIAGNOSIS — Z0000 Encounter for general adult medical examination without abnormal findings: Secondary | ICD-10-CM

## 2021-05-27 DIAGNOSIS — E039 Hypothyroidism, unspecified: Secondary | ICD-10-CM

## 2021-05-27 NOTE — Telephone Encounter (Signed)
-----   Message from Ellamae Sia sent at 05/13/2021 10:43 AM EDT ----- Regarding: Lab orders for Tuesday, 7.12.22 Patient is scheduled for CPX labs, please order future labs, Thanks , [name]  Dr Burr Medico wanted a magnesium, too

## 2021-05-28 ENCOUNTER — Ambulatory Visit (INDEPENDENT_AMBULATORY_CARE_PROVIDER_SITE_OTHER): Payer: Medicare Other

## 2021-05-28 ENCOUNTER — Other Ambulatory Visit: Payer: Self-pay

## 2021-05-28 ENCOUNTER — Telehealth: Payer: Self-pay | Admitting: Family Medicine

## 2021-05-28 ENCOUNTER — Other Ambulatory Visit (INDEPENDENT_AMBULATORY_CARE_PROVIDER_SITE_OTHER): Payer: Medicare Other

## 2021-05-28 DIAGNOSIS — E039 Hypothyroidism, unspecified: Secondary | ICD-10-CM

## 2021-05-28 DIAGNOSIS — E559 Vitamin D deficiency, unspecified: Secondary | ICD-10-CM

## 2021-05-28 DIAGNOSIS — Z0000 Encounter for general adult medical examination without abnormal findings: Secondary | ICD-10-CM | POA: Diagnosis not present

## 2021-05-28 DIAGNOSIS — D72819 Decreased white blood cell count, unspecified: Secondary | ICD-10-CM | POA: Diagnosis not present

## 2021-05-28 DIAGNOSIS — E7849 Other hyperlipidemia: Secondary | ICD-10-CM

## 2021-05-28 DIAGNOSIS — D696 Thrombocytopenia, unspecified: Secondary | ICD-10-CM

## 2021-05-28 LAB — COMPREHENSIVE METABOLIC PANEL
ALT: 7 U/L (ref 0–35)
AST: 15 U/L (ref 0–37)
Albumin: 3.6 g/dL (ref 3.5–5.2)
Alkaline Phosphatase: 58 U/L (ref 39–117)
BUN: 17 mg/dL (ref 6–23)
CO2: 27 mEq/L (ref 19–32)
Calcium: 8.3 mg/dL — ABNORMAL LOW (ref 8.4–10.5)
Chloride: 108 mEq/L (ref 96–112)
Creatinine, Ser: 1.01 mg/dL (ref 0.40–1.20)
GFR: 53.79 mL/min — ABNORMAL LOW (ref >60.00)
Glucose, Bld: 91 mg/dL (ref 70–99)
Potassium: 4.3 mEq/L (ref 3.5–5.1)
Sodium: 141 mEq/L (ref 135–145)
Total Bilirubin: 1 mg/dL (ref 0.2–1.2)
Total Protein: 5.9 g/dL — ABNORMAL LOW (ref 6.0–8.3)

## 2021-05-28 LAB — CBC WITH DIFFERENTIAL/PLATELET
Basophils Absolute: 0 10*3/uL (ref 0.0–0.1)
Basophils Relative: 1.6 % (ref 0.0–3.0)
Eosinophils Absolute: 0.1 10*3/uL (ref 0.0–0.7)
Eosinophils Relative: 3.4 % (ref 0.0–5.0)
HCT: 34.7 % — ABNORMAL LOW (ref 36.0–46.0)
Hemoglobin: 12.3 g/dL (ref 12.0–15.0)
Lymphocytes Relative: 27.6 % (ref 12.0–46.0)
Lymphs Abs: 0.7 10*3/uL (ref 0.7–4.0)
MCHC: 35.4 g/dL (ref 30.0–36.0)
MCV: 93.8 fl (ref 78.0–100.0)
Monocytes Absolute: 0.2 10*3/uL (ref 0.1–1.0)
Monocytes Relative: 6.3 % (ref 3.0–12.0)
Neutro Abs: 1.4 10*3/uL (ref 1.4–7.7)
Neutrophils Relative %: 61.1 % (ref 43.0–77.0)
Platelets: 128 10*3/uL — ABNORMAL LOW (ref 150.0–400.0)
RBC: 3.69 Mil/uL — ABNORMAL LOW (ref 3.87–5.11)
RDW: 13 % (ref 11.5–15.5)
WBC: 2.4 10*3/uL — ABNORMAL LOW (ref 4.0–10.5)

## 2021-05-28 LAB — LIPID PANEL
Cholesterol: 183 mg/dL (ref 0–200)
HDL: 57.3 mg/dL (ref >39.00)
LDL Cholesterol: 115 mg/dL — ABNORMAL HIGH (ref 0–99)
NonHDL: 126.14
Total CHOL/HDL Ratio: 3
Triglycerides: 58 mg/dL (ref 0.0–149.0)
VLDL: 11.6 mg/dL (ref 0.0–40.0)

## 2021-05-28 LAB — TSH: TSH: 10.99 u[IU]/mL — ABNORMAL HIGH (ref 0.35–5.50)

## 2021-05-28 LAB — VITAMIN D 25 HYDROXY (VIT D DEFICIENCY, FRACTURES): VITD: 24.39 ng/mL — ABNORMAL LOW (ref 30.00–100.00)

## 2021-05-28 NOTE — Telephone Encounter (Signed)
Contacted pt who reports she was in this morning for a lab draw and she forgot to tell the phlebotomist that she should not have blood drawn in left arm due to breast lumpectomy. Pt is wondering if she should be concerned. Pt reported the blood draw was the best she ever had and the least painful she has ever had in the past. Pt denies any warmth, redness, pain, or swelling at the injection site or anywhere on that arm. Advised pt to keep an ey on the injection site and that arm and if any of the symptoms above occur to contact the office. Advised pt a msg would be sent to a provider that was taking PCPs msgs while she was out this wk and if there were any further recommendations this office would f/u. Pt was appreciative of the call and verbalized understanding.

## 2021-05-28 NOTE — Patient Instructions (Signed)
Jennifer Sellers , Thank you for taking time to come for your Medicare Wellness Visit. I appreciate your ongoing commitment to your health goals. Please review the following plan we discussed and let me know if I can assist you in the future.   Screening recommendations/referrals: Colonoscopy: no longer required  Mammogram: Up to date, completed 08/02/2020, due 07/2021 Bone Density: Up to date, completed 06/28/2019, due 06/2021 Recommended yearly ophthalmology/optometry visit for glaucoma screening and checkup Recommended yearly dental visit for hygiene and checkup  Vaccinations: Influenza vaccine: due Fall 2022  Pneumococcal vaccine: Completed series Tdap vaccine: Up to date, completed 02/02/2018, due 01/2028 Shingles vaccine: due, check with your insurance regarding coverage if interested    Covid-19:Completed 3 vaccines, Will bring card with booster date to physical so we can document in chart  Advanced directives: Please bring a copy of your POA (Power of [place]) and/or Living Will to your next appointment.   Conditions/risks identified: hyperlipidemia   Next appointment: Follow up in one year for your annual wellness visit    Preventive Care 65 Years and Older, Female Preventive care refers to lifestyle choices and visits with your health care provider that can promote health and wellness. What does preventive care include? A yearly physical exam. This is also called an annual well check. Dental exams once or twice a year. Routine eye exams. Ask your health care provider how often you should have your eyes checked. Personal lifestyle choices, including: Daily care of your teeth and gums. Regular physical activity. Eating a healthy diet. Avoiding tobacco and drug use. Limiting alcohol use. Practicing safe sex. Taking low-dose aspirin every day. Taking vitamin and mineral supplements as recommended by your health care provider. What happens during an annual well check? The services and  screenings done by your health care provider during your annual well check will depend on your age, overall health, lifestyle risk factors, and family history of disease. Counseling  Your health care provider may ask you questions about your: Alcohol use. Tobacco use. Drug use. Emotional well-being. Home and relationship well-being. Sexual activity. Eating habits. History of falls. Memory and ability to understand (cognition). Work and work Statistician. Reproductive health. Screening  You may have the following tests or measurements: Height, weight, and BMI. Blood pressure. Lipid and cholesterol levels. These may be checked every 5 years, or more frequently if you are over 57 years old. Skin check. Lung cancer screening. You may have this screening every year starting at age 39 if you have a 30-pack-year history of smoking and currently smoke or have quit within the past 15 years. Fecal occult blood test (FOBT) of the stool. You may have this test every year starting at age 40. Flexible sigmoidoscopy or colonoscopy. You may have a sigmoidoscopy every 5 years or a colonoscopy every 10 years starting at age 42. Hepatitis C blood test. Hepatitis B blood test. Sexually transmitted disease (STD) testing. Diabetes screening. This is done by checking your blood sugar (glucose) after you have not eaten for a while (fasting). You may have this done every 1-3 years. Bone density scan. This is done to screen for osteoporosis. You may have this done starting at age 66. Mammogram. This may be done every 1-2 years. Talk to your health care provider about how often you should have regular mammograms. Talk with your health care provider about your test results, treatment options, and if necessary, the need for more tests. Vaccines  Your health care provider may recommend certain vaccines, such as:  Influenza vaccine. This is recommended every year. Tetanus, diphtheria, and acellular pertussis (Tdap,  Td) vaccine. You may need a Td booster every 10 years. Zoster vaccine. You may need this after age 70. Pneumococcal 13-valent conjugate (PCV13) vaccine. One dose is recommended after age 25. Pneumococcal polysaccharide (PPSV23) vaccine. One dose is recommended after age 49. Talk to your health care provider about which screenings and vaccines you need and how often you need them. This information is not intended to replace advice given to you by your health care provider. Make sure you discuss any questions you have with your health care provider. Document Released: 11/30/2015 Document Revised: 07/23/2016 Document Reviewed: 09/04/2015 Elsevier Interactive Patient Education  2017 [place] Prevention in the Home Falls can cause injuries. They can happen to people of all ages. There are many things you can do to make your home safe and to help prevent falls. What can I do on the outside of my home? Regularly fix the edges of walkways and driveways and fix any cracks. Remove anything that might make you trip as you walk through a door, such as a raised step or threshold. Trim any bushes or trees on the path to your home. Use bright outdoor lighting. Clear any walking paths of anything that might make someone trip, such as rocks or tools. Regularly check to see if handrails are loose or broken. Make sure that both sides of any steps have handrails. Any raised decks and porches should have guardrails on the edges. Have any leaves, snow, or ice cleared regularly. Use sand or salt on walking paths during winter. Clean up any spills in your garage right away. This includes oil or grease spills. What can I do in the bathroom? Use night lights. Install grab bars by the toilet and in the tub and shower. Do not use towel bars as grab bars. Use non-skid mats or decals in the tub or shower. If you need to sit down in the shower, use a plastic, non-slip stool. Keep the floor dry. Clean up any  water that spills on the floor as soon as it happens. Remove soap buildup in the tub or shower regularly. Attach bath mats securely with double-sided non-slip rug tape. Do not have throw rugs and other things on the floor that can make you trip. What can I do in the bedroom? Use night lights. Make sure that you have a light by your bed that is easy to reach. Do not use any sheets or blankets that are too big for your bed. They should not hang down onto the floor. Have a firm chair that has side arms. You can use this for support while you get dressed. Do not have throw rugs and other things on the floor that can make you trip. What can I do in the kitchen? Clean up any spills right away. Avoid walking on wet floors. Keep items that you use a lot in easy-to-reach places. If you need to reach something above you, use a strong step stool that has a grab bar. Keep electrical cords out of the way. Do not use floor polish or wax that makes floors slippery. If you must use wax, use non-skid floor wax. Do not have throw rugs and other things on the floor that can make you trip. What can I do with my stairs? Do not leave any items on the stairs. Make sure that there are handrails on both sides of the stairs and use them.  Fix handrails that are broken or loose. Make sure that handrails are as Reen as the stairways. Check any carpeting to make sure that it is firmly attached to the stairs. Fix any carpet that is loose or worn. Avoid having throw rugs at the top or bottom of the stairs. If you do have throw rugs, attach them to the floor with carpet tape. Make sure that you have a light switch at the top of the stairs and the bottom of the stairs. If you do not have them, ask someone to add them for you. What else can I do to help prevent falls? Wear shoes that: Do not have high heels. Have rubber bottoms. Are comfortable and fit you well. Are closed at the toe. Do not wear sandals. If you use a  stepladder: Make sure that it is fully opened. Do not climb a closed stepladder. Make sure that both sides of the stepladder are locked into place. Ask someone to hold it for you, if possible. Clearly mark and make sure that you can see: Any grab bars or handrails. First and last steps. Where the edge of each step is. Use tools that help you move around (mobility aids) if they are needed. These include: Canes. Walkers. Scooters. Crutches. Turn on the lights when you go into a dark area. Replace any light bulbs as soon as they burn out. Set up your furniture so you have a clear path. Avoid moving your furniture around. If any of your floors are uneven, fix them. If there are any pets around you, be aware of where they are. Review your medicines with your doctor. Some medicines can make you feel dizzy. This can increase your chance of falling. Ask your doctor what other things that you can do to help prevent falls. This information is not intended to replace advice given to you by your health care provider. Make sure you discuss any questions you have with your health care provider. Document Released: 08/30/2009 Document Revised: 04/10/2016 Document Reviewed: 12/08/2014 Elsevier Interactive Patient Education  2017 [employer].

## 2021-05-28 NOTE — Telephone Encounter (Signed)
Pt wanted a call back to discuss her lab draw from this morning.

## 2021-05-28 NOTE — Telephone Encounter (Signed)
Thanks for letting me know. More than likely will not be a problem.  She can consult with her surgeon as well but not sure if she still sees them.   Keep [place] posted.

## 2021-05-28 NOTE — Progress Notes (Signed)
PCP notes:  Health Maintenance: Shingrix- due    Abnormal Screenings: none   Patient concerns: Memory issues    Nurse concerns: none   Next PCP appt.: 06/07/2021 @ 8:30 am

## 2021-05-28 NOTE — Progress Notes (Signed)
Subjective:   Jennifer Sellers is a 75 y.o. female who presents for Medicare Annual (Subsequent) preventive examination.  Review of Systems: N/A     I connected with the patient today by telephone and verified that I am speaking with the correct person using two identifiers. Location patient: home Location nurse: work Persons participating in the telephone visit: patient, nurse.   I discussed the limitations, risks, security and privacy concerns of performing an evaluation and management service by telephone and the availability of in person appointments. I also discussed with the patient that there may be a patient responsible charge related to this service. The patient expressed understanding and verbally consented to this telephonic visit.        Cardiac Risk Factors include: advanced age (>37men, >44 women);Other (see comment), Risk factor comments: hyperlipidemia     Objective:    Today's Vitals   There is no height or weight on file to calculate BMI.  Advanced Directives 05/28/2021 10/31/2020 10/05/2020 09/28/2020 08/29/2020 08/29/2020 05/09/2019  Does Patient Have a Medical Advance Directive? Yes Yes Yes No No No Yes  Type of Paramedic of [place];Living will [place];Living will - - - - Press photographer;Living will  Does patient want to make changes to medical advance directive? - - No - Patient declined - - - No - Patient declined  Copy of [place] in Chart? No - copy requested - - - - - No - copy requested  Would patient like information on creating a medical advance directive? - - No - Patient declined No - Patient declined No - Patient declined - -    Current Medications (verified) Outpatient Encounter Medications as of 05/28/2021  Medication Sig   Digestive Aids Mixture (DIGESTION GB) CAPS Take 1 capsule by mouth daily.   levothyroxine (SYNTHROID) 50 MCG tablet TAKE 1 TABLET EVERY DAY AND 2 ON  SUNDAY   Multiple Vitamins-Minerals (PRESERVISION AREDS 2 PO) Take 2 capsules by mouth daily.    NON FORMULARY 2 drops daily. sustane   OVER THE COUNTER MEDICATION Optimal longevi D K2   OVER THE COUNTER MEDICATION Liqua A   tamoxifen (NOLVADEX) 20 MG tablet Take 1 tablet (20 mg total) by mouth daily.   Cholecalciferol (OPTIMAL-D PO) Take 1 tablet by mouth daily. (Patient not taking: Reported on 05/28/2021)   HYDROcodone-acetaminophen (NORCO/VICODIN) 5-325 MG tablet Take 1-2 tablets by mouth every 6 (six) hours as needed for moderate pain or severe pain. (Patient not taking: Reported on 05/28/2021)   No facility-administered encounter medications on file as of 05/28/2021.    Allergies (verified) Cipro [ciprofloxacin hcl], Cortisporin [bacitra-neomycin-polymyxin-hc], Prevnar [pneumococcal 13-val conj vacc], and Sulfonamide derivatives   History: Past Medical History:  Diagnosis Date   Arthritis    rheumatoid arthritis   Hypothyroidism    Leukopenia    Thyroid disease    TIA (transient ischemic attack) 2011   Past Surgical History:  Procedure Laterality Date   ABDOMINAL HYSTERECTOMY     BREAST LUMPECTOMY WITH RADIOACTIVE SEED AND SENTINEL LYMPH NODE BIOPSY Left 10/05/2020   Procedure: LEFT BREAST LUMPECTOMY WITH RADIOACTIVE SEED AND SENTINEL LYMPH NODE BIOPSY;  Surgeon: Jovita Kussmaul, MD;  Location: [facility];  Service: General;  Laterality: Left;  GENERAL AND PECTORAL BLOCK   CARPAL TUNNEL RELEASE     COLONOSCOPY     COLONOSCOPY WITH PROPOFOL N/A 06/30/2018   Procedure: COLONOSCOPY WITH PROPOFOL;  Surgeon: Manya Silvas, MD;  Location: [facility];  Service: Endoscopy;  Laterality: N/A;   colpo-ureteroscopy     lens surgery     Family History  Problem Relation Age of Onset   Cancer Mother        lymphoma   Cancer Brother        lymphoma   Cancer Daughter        NHL   Social History   Socioeconomic History   Marital status: Married    Spouse name:  Not on file   Number of children: 2   Years of education: Not on file   Highest education level: Not on file  Occupational History   Not on file  Tobacco Use   Smoking status: Never   Smokeless tobacco: Never  Vaping Use   Vaping Use: Never used  Substance and Sexual Activity   Alcohol use: Yes    Alcohol/week: 0.0 standard drinks    Comment: occasional   Drug use: No   Sexual activity: Yes  Other Topics Concern   Not on file  Social History Narrative   Not on file   Social Determinants of Health   Financial Resource Strain: Low Risk    Difficulty of Paying Living Expenses: Not hard at all  Food Insecurity: No Food Insecurity   Worried About Charity fundraiser in the Last Year: Never true   [place] in the Last Year: Never true  Transportation Needs: No Transportation Needs   Lack of Transportation (Medical): No   Lack of Transportation (Non-Medical): No  Physical Activity: Inactive   Days of Exercise per Week: 0 days   Minutes of Exercise per Session: 0 min  Stress: No Stress Concern Present   Feeling of Stress : Not at all  Social Connections: Not on file    Tobacco Counseling Counseling given: Not Answered   Clinical Intake:  Pre-visit preparation completed: Yes  Pain : No/denies pain     Nutritional Risks: None Diabetes: No  How often do you need to have someone help you when you read instructions, pamphlets, or other written materials from your doctor or pharmacy?: 1 - Never  Diabetic: No Nutrition Risk Assessment:  Has the patient had any N/V/D within the last 2 months?  No  Does the patient have any non-healing wounds?  No  Has the patient had any unintentional weight loss or weight gain?  No   Diabetes:  Is the patient diabetic?  No  If diabetic, was a CBG obtained today?   N/A Did the patient bring in their glucometer from home?   N/A How often do you monitor your CBG's? N/A.   Financial Strains and Diabetes Management:  Are  you having any financial strains with the device, your supplies or your medication?  N/A .  Does the patient want to be seen by Chronic Care Management for management of their diabetes?   N/A Would the patient like to be referred to a Nutritionist or for Diabetic Management?   N/A  Interpreter Needed?: No  Information entered by :: CJohnson, LPN   Activities of Daily Living In your present state of health, do you have any difficulty performing the following activities: 05/28/2021 10/05/2020  Hearing? N N  Vision? N N  Difficulty concentrating or making decisions? N N  Walking or climbing stairs? N N  Dressing or bathing? N N  Doing errands, shopping? N -  Preparing Food and eating ? N -  Using the Toilet? N -  In the past six months, have you accidently leaked urine? N -  Do you have problems with loss of bowel control? N -  Managing your Medications? N -  Managing your Finances? N -  Housekeeping or managing your Housekeeping? N -  Some recent data might be hidden    Patient Care Team: Tower, Wynelle Fanny, MD as PCP - General Lad, Caesar Bookman, MD as Referring Physician (Ophthalmology) Birder Robson, MD as Referring Physician (Ophthalmology) Worthy Keeler, DC as Consulting Physician (Chiropractic Medicine) Mauro Kaufmann, RN as Oncology Nurse Navigator Rockwell Germany, RN as Oncology Nurse Navigator Jovita Kussmaul, MD as Consulting Physician (General Surgery) Truitt Merle, MD as Consulting Physician (Hematology) Kyung Rudd, MD as Consulting Physician (Radiation Oncology) Alla Feeling, NP as Nurse Practitioner (Nurse Practitioner)  Indicate any recent Medical Services you may have received from other than [facility] providers in the past year (date may be approximate).     Assessment:   This is a routine wellness examination for Carmen.  Hearing/Vision screen Vision Screening - Comments:: Patient gets annual eye exams   Dietary issues and exercise activities  discussed: Current Exercise Habits: The patient does not participate in regular exercise at present, Exercise limited by: None identified   Goals Addressed             This Visit's Progress    Patient Stated       05/28/2021, I will maintain and continue medications as prescribed.         Depression Screen PHQ 2/9 Scores 05/28/2021 05/30/2020 05/09/2019 01/28/2018 04/22/2017 05/15/2016 05/15/2015  PHQ - 2 Score 0 0 0 0 0 0 0  PHQ- 9 Score 0 - 0 0 - - -    Fall Risk Fall Risk  05/28/2021 05/30/2020 05/09/2019 01/28/2018 04/22/2017  Falls in the past year? 0 1 0 No No  Number falls in past yr: 0 0 - - -  Injury with Fall? 0 1 - - -  Risk for fall due to : Medication side effect - - - -  Follow up Falls evaluation completed;Falls prevention discussed Falls evaluation completed - - -    FALL RISK PREVENTION PERTAINING TO THE HOME:  Any stairs in or around the home? Yes  If so, are there any without handrails? No  Home free of loose throw rugs in walkways, pet beds, electrical cords, etc? Yes  Adequate lighting in your home to reduce risk of falls? Yes   ASSISTIVE DEVICES UTILIZED TO PREVENT FALLS:  Life alert? No  Use of a cane, walker or w/c? No  Grab bars in the bathroom? No  Shower chair or bench in shower? No  Elevated toilet seat or a handicapped toilet? No   TIMED UP AND GO:  Was the test performed?  N/A telephone visit .    Cognitive Function: MMSE - Mini Mental State Exam 05/28/2021 05/09/2019 01/28/2018 04/22/2017 05/15/2016  Orientation to time 5 5 5 5 5   Orientation to Place 5 5 5 5 5   Registration 3 3 3 3 3   Attention/ Calculation 5 0 0 0 0  Recall 3 3 3 3 3   Language- name 2 objects - 0 0 0 0  Language- repeat 1 1 1 1 1   Language- follow 3 step command - 0 3 3 3   Language- read & follow direction - 0 0 0 0  Write a sentence - 0 0 0 0  Copy design - 0 0 0 0  Total score -  17 20 20 20   Mini Cog  Mini-Cog screen was completed. Maximum score is 22. A value of 0  denotes this part of the MMSE was not completed or the patient failed this part of the Mini-Cog screening.       Immunizations Immunization History  Administered Date(s) Administered   PFIZER(Purple Top)SARS-COV-2 Vaccination 12/07/2019, 12/28/2019   Pneumococcal Conjugate-13 05/15/2015   Td 04/06/2007, 02/02/2018    TDAP status: Up to date  Flu Vaccine status: due Fall 2022  Pneumococcal vaccine status: Up to date  Covid-19 vaccine status: Completed 3 vaccines, Will bring card with booster date to physical so we can document in chart  Qualifies for Shingles Vaccine? Yes   Zostavax completed No   Shingrix Completed?: No.    Education has been provided regarding the importance of this vaccine. Patient has been advised to call insurance company to determine out of pocket expense if they have not yet received this vaccine. Advised may also receive vaccine at local pharmacy or Health Dept. Verbalized acceptance and understanding.  Screening Tests Health Maintenance  Topic Date Due   Zoster Vaccines- Shingrix (1 of 2) Never done   COVID-19 Vaccine (3 - Pfizer risk series) 01/25/2020   INFLUENZA VACCINE  06/17/2021   MAMMOGRAM  08/02/2021   TETANUS/TDAP  02/03/2028   DEXA SCAN  Completed   Hepatitis C Screening  Completed   HPV VACCINES  Aged Out    Health Maintenance  Health Maintenance Due  Topic Date Due   Zoster Vaccines- Shingrix (1 of 2) Never done   COVID-19 Vaccine (3 - Pfizer risk series) 01/25/2020    Colorectal cancer screening: No longer required.   Mammogram status: Completed 08/02/2020. Repeat every year  Bone Density status: Completed 06/28/2019. Results reflect: Bone density results: OSTEOPENIA. Repeat every 2 years.  Lung Cancer Screening: (Low Dose CT Chest recommended if Age 48-80 years, 30 pack-year currently smoking OR have quit w/in 15years.) does not qualify.    Additional Screening:  Hepatitis C Screening: does qualify; Completed  11/21/2013  Vision Screening: Recommended annual ophthalmology exams for early detection of glaucoma and other disorders of the eye. Is the patient up to date with their annual eye exam?  Yes  Who is the provider or what is the name of the office in which the patient attends annual eye exams? Dr. Rosine Door, [facility] If pt is not established with a provider, would they like to be referred to a provider to establish care? No .   Dental Screening: Recommended annual dental exams for proper oral hygiene  Community Resource Referral / Chronic Care Management: CRR required this visit?  No   CCM required this visit?  No      Plan:     I have personally reviewed and noted the following in the patient's chart:   Medical and social history Use of alcohol, tobacco or illicit drugs  Current medications and supplements including opioid prescriptions.  Functional ability and status Nutritional status Physical activity Advanced directives List of other physicians Hospitalizations, surgeries, and ER visits in previous 12 months Vitals Screenings to include cognitive, depression, and falls Referrals and appointments  In addition, I have reviewed and discussed with patient certain preventive protocols, quality metrics, and best practice recommendations. A written personalized care plan for preventive services as well as general preventive health recommendations were provided to patient.   Due to this being a telephonic visit, the after visit summary with patients personalized plan was offered to patient  via office or my-chart.  Patient preferred to pick up at office at next visit or via mychart.   Andrez Grime, LPN   [id number]

## 2021-05-29 NOTE — Telephone Encounter (Signed)
Left VM requesting pt to call the office back 

## 2021-05-29 NOTE — Telephone Encounter (Addendum)
Pt notified of Dr. Marliss Coots comments and pt verbalized understanding

## 2021-05-31 ENCOUNTER — Encounter: Payer: Medicare Other | Admitting: Family Medicine

## 2021-06-05 ENCOUNTER — Telehealth: Payer: Self-pay | Admitting: *Deleted

## 2021-06-05 ENCOUNTER — Encounter: Payer: Medicare Other | Admitting: Family Medicine

## 2021-06-05 NOTE — Telephone Encounter (Signed)
Patient left a voicemail stating that she has an appointment tomorrow at another office and has not been able to get in touch with them to ask some questions. Patient requested a call back from our office.  Called patient back and was advised that she has already taken care of this and does not need our help at this time.

## 2021-06-06 ENCOUNTER — Other Ambulatory Visit: Payer: Self-pay

## 2021-06-06 ENCOUNTER — Other Ambulatory Visit: Payer: Medicare Other

## 2021-06-06 ENCOUNTER — Inpatient Hospital Stay: Payer: Medicare Other | Attending: Hematology | Admitting: Hematology

## 2021-06-06 ENCOUNTER — Encounter: Payer: Self-pay | Admitting: Hematology

## 2021-06-06 VITALS — BP 117/79 | HR 71 | Temp 98.5°F | Resp 18 | Ht 67.0 in | Wt 137.3 lb

## 2021-06-06 DIAGNOSIS — Z79810 Long term (current) use of selective estrogen receptor modulators (SERMs): Secondary | ICD-10-CM | POA: Insufficient documentation

## 2021-06-06 DIAGNOSIS — D72819 Decreased white blood cell count, unspecified: Secondary | ICD-10-CM | POA: Diagnosis not present

## 2021-06-06 DIAGNOSIS — E039 Hypothyroidism, unspecified: Secondary | ICD-10-CM | POA: Insufficient documentation

## 2021-06-06 DIAGNOSIS — M85852 Other specified disorders of bone density and structure, left thigh: Secondary | ICD-10-CM | POA: Diagnosis not present

## 2021-06-06 DIAGNOSIS — C50412 Malignant neoplasm of upper-outer quadrant of left female breast: Secondary | ICD-10-CM | POA: Insufficient documentation

## 2021-06-06 DIAGNOSIS — Z170 Estrogen receptor positive status [ER+]: Secondary | ICD-10-CM | POA: Diagnosis not present

## 2021-06-06 NOTE — Progress Notes (Signed)
[place]   Telephone:([phone]) [phone] Fax:(336) [phone]   Clinic Follow up Note   Patient Care Team: Tower, Wynelle Fanny, MD as PCP - General Lad, Caesar Bookman, MD as Referring Physician (Ophthalmology) Birder Robson, MD as Referring Physician (Ophthalmology) Worthy Keeler, DC as Consulting Physician (Chiropractic Medicine) Mauro Kaufmann, RN as Oncology Nurse Navigator Rockwell Germany, RN as Oncology Nurse Navigator Jovita Kussmaul, MD as Consulting Physician (General Surgery) Truitt Merle, MD as Consulting Physician (Hematology) Kyung Rudd, MD as Consulting Physician (Radiation Oncology) Alla Feeling, NP as Nurse Practitioner (Nurse Practitioner)  Date of Service:  06/06/2021  CHIEF COMPLAINT: F/u of left breast cancer   SUMMARY OF ONCOLOGIC HISTORY: Oncology History Overview Note  Cancer Staging Malignant neoplasm of upper-outer quadrant of left breast in female, estrogen receptor positive ([place]) Staging form: Breast, AJCC 8th Edition - Clinical stage from 08/29/2020: Stage IA (cT1c, cN0, cM0, G1, ER+, PR+, HER2-) - Unsigned - Pathologic stage from 10/31/2020: Stage IA (pT2, pN0(sn), cM0, G1, ER+, PR+, HER2-, Oncotype DX score: 12) - Unsigned    Malignant neoplasm of upper-outer quadrant of left breast in female, estrogen receptor positive ([place])  08/02/2020 Mammogram   IMPRESSION The 1cm (0.9x1.2x0.9cm on US)asymmetry in the left breast at 1:00 position, 4 cmfn is indeterminate   08/22/2020 Initial Biopsy   Diagnosis 08/22/20 Breast, left, needle core biopsy, 1 o'clock, 4cmfn -INVASIVE MAMMARY CARCINOMA -SEE COMMENT       -Grade 1 or 3  E-cadherin is NEGATIVE supporting lobular origin   08/22/2020 Receptors her2   ER - 95%  PR - 25%  Ki67 - 10% HER2 Negative on [facility]   08/24/2020 Initial Diagnosis   Malignant neoplasm of upper-outer quadrant of left breast in female, estrogen receptor positive ([place])   08/29/2020 Breast MRI   IMPRESSION: 1.  Enhancing mass in the upper slightly outer left breast measuring approximately 2.4 x 1.6 x 1.8 cm, consistent with the patient's known malignancy. 2. No MRI evidence of malignancy in the right breast. 3. No suspicious lymphadenopathy.   10/05/2020 Surgery   LEFT BREAST LUMPECTOMY WITH RADIOACTIVE SEED AND SENTINEL LYMPH NODE BIOPSY by Dr Marlou Starks    10/05/2020 Pathology Results   FINAL MICROSCOPIC DIAGNOSIS:   A. LYMPH NODE, LEFT AXILLARY #1, SENTINEL, EXCISION:  -  No carcinoma identified in one lymph node (0/1)  -  See comment   B. LYMPH NODE, LEFT AXILLARY, SENTINEL, EXCISION:  -  No carcinoma identified in one lymph node (0/1)  -  See comment   C. LYMPH NODE, LEFT AXILLARY #2, SENTINEL, EXCISION:  -  No carcinoma identified in one lymph node (0/1)  -  See comment   D. BREAST, LEFT, LUMPECTOMY:  -  Invasive lobular carcinoma, Nottingham grade 1 of 3, 2.3 cm  -  Margins uninvolved by carcinoma (<0.1 cm; superior margin)  -  Previous biopsy site changes present  -  See oncology table and comment below    10/05/2020 Oncotype testing   Oncoytpe  Recurrence Score 12 with distant recurrence risk at 9 years with AI or Tamoxifen at 3%  There is less than 1% benefit of chemotherapy.    11/19/2020 - 12/14/2020 Radiation Therapy   Adjuvant Radiation with Dr Lisbeth Renshaw    12/2020 -  Anti-estrogen oral therapy   Tamoxifen $RemoveBe'20mg'fqPKiiWIf$  daily starting 12/2020    03/13/2021 Survivorship   SCP delivered by Cira Rue, NP      CURRENT THERAPY:  Tamoxifen $RemoveBe'20mg'AsVFFTZrx$  daily starting 12/2020  INTERVAL HISTORY:  Jennifer Sellers is here for a follow up of left breast cancer. She presents to the clinic alone. She has been on Tamixifen since 12/2020, tolerating well overall  She had shoulder pain at benigning but resolved on it's own after a month  NO other joint pain or other concerns She eats well, she lost 3 lbs since April, she contributes to her diet change in summer   All other systems were reviewed with  the patient and are negative.  MEDICAL HISTORY:  Past Medical History:  Diagnosis Date   Arthritis    rheumatoid arthritis   Hypothyroidism    Leukopenia    Thyroid disease    TIA (transient ischemic attack) 2011    SURGICAL HISTORY: Past Surgical History:  Procedure Laterality Date   ABDOMINAL HYSTERECTOMY     BREAST LUMPECTOMY WITH RADIOACTIVE SEED AND SENTINEL LYMPH NODE BIOPSY Left 10/05/2020   Procedure: LEFT BREAST LUMPECTOMY WITH RADIOACTIVE SEED AND SENTINEL LYMPH NODE BIOPSY;  Surgeon: Jovita Kussmaul, MD;  Location: [place];  Service: General;  Laterality: Left;  GENERAL AND PECTORAL BLOCK   CARPAL TUNNEL RELEASE     COLONOSCOPY     COLONOSCOPY WITH PROPOFOL N/A 06/30/2018   Procedure: COLONOSCOPY WITH PROPOFOL;  Surgeon: Manya Silvas, MD;  [facility] ENDOSCOPY;  Service: Endoscopy;  Laterality: N/A;   colpo-ureteroscopy     lens surgery      I have reviewed the social history and family history with the patient and they are unchanged from previous note.  ALLERGIES:  is allergic to cipro [ciprofloxacin hcl], cortisporin [bacitra-neomycin-polymyxin-hc], prevnar [pneumococcal 13-val conj vacc], and sulfonamide derivatives.  MEDICATIONS:  Current Outpatient Medications  Medication Sig Dispense Refill   Cholecalciferol (OPTIMAL-D PO) Take 1 tablet by mouth daily. (Patient not taking: Reported on 05/28/2021)     Digestive Aids Mixture (DIGESTION GB) CAPS Take 1 capsule by mouth daily.     HYDROcodone-acetaminophen (NORCO/VICODIN) 5-325 MG tablet Take 1-2 tablets by mouth every 6 (six) hours as needed for moderate pain or severe pain. (Patient not taking: Reported on 05/28/2021) 15 tablet 0   levothyroxine (SYNTHROID) 50 MCG tablet TAKE 1 TABLET EVERY DAY AND 2 ON SUNDAY 104 tablet 1   Multiple Vitamins-Minerals (PRESERVISION AREDS 2 PO) Take 2 capsules by mouth daily.      NON FORMULARY 2 drops daily. sustane     OVER THE COUNTER MEDICATION Optimal  longevi D K2     OVER THE COUNTER MEDICATION Liqua A     tamoxifen (NOLVADEX) 20 MG tablet Take 1 tablet (20 mg total) by mouth daily. 30 tablet 6   No current facility-administered medications for this visit.    PHYSICAL EXAMINATION: ECOG PERFORMANCE STATUS: 0 - Asymptomatic  Vitals:   06/06/21 0939  BP: 117/79  Pulse: 71  Resp: 18  Temp: 98.5 F (36.9 C)  SpO2: 100%    Filed Weights   06/06/21 0939  Weight: 137 lb 4.8 oz (62.3 kg)   GENERAL:alert, no distress and comfortable SKIN: skin color, texture, turgor are normal, no rashes or significant lesions EYES: normal, Conjunctiva are pink and non-injected, sclera clear  NECK: supple, thyroid normal size, non-tender, without nodularity LYMPH:  no palpable lymphadenopathy in the cervical, axillary  LUNGS: clear to auscultation and percussion with normal breathing effort HEART: regular rate & rhythm and no murmurs and no lower extremity edema ABDOMEN:abdomen soft, non-tender and normal bowel sounds Musculoskeletal:no cyanosis of digits and no clubbing  NEURO:  alert & oriented x 3 with fluent speech, no focal motor/sensory deficits BREAST: S/p left lumpectomy: Surgical incision healed well. No palpable mass, nodules or adenopathy bilaterally. Breast exam benign.   LABORATORY DATA:  I have reviewed the data as listed CBC Latest Ref Rng & Units 05/28/2021 08/29/2020 05/23/2020  WBC 4.0 - 10.5 K/uL 2.4 Repeated and verified X2.(L) 3.1(L) 2.3(L)  Hemoglobin 12.0 - 15.0 g/dL 12.3 12.0 12.3  Hematocrit 36.0 - 46.0 % 34.7(L) 35.0(L) 35.6(L)  Platelets 150.0 - 400.0 K/uL 128.0(L) 151 160.0     CMP Latest Ref Rng & Units 05/28/2021 08/29/2020 05/23/2020  Glucose 70 - 99 mg/dL 91 86 97  BUN 6 - 23 mg/dL $Remove'17 10 15  'SIaSOEZ$ Creatinine 0.40 - 1.20 mg/dL 1.01 0.93 0.88  Sodium 135 - 145 mEq/L 141 142 141  Potassium 3.5 - 5.1 mEq/L 4.3 4.0 4.7  Chloride 96 - 112 mEq/L 108 106 106  CO2 19 - 32 mEq/L 27 32 30  Calcium 8.4 - 10.5 mg/dL 8.3(L) 8.9  8.8  Total Protein 6.0 - 8.3 g/dL 5.9(L) 6.6 6.3  Total Bilirubin 0.2 - 1.2 mg/dL 1.0 2.1(H) 1.6(H)  Alkaline Phos 39 - 117 U/L 58 79 73  AST 0 - 37 U/L $Remo'15 18 18  'RUcoE$ ALT 0 - 35 U/L $Remo'7 8 10      'IxGnf$ RADIOGRAPHIC STUDIES: I have personally reviewed the radiological images as listed and agreed with the findings in the report. No results found.   ASSESSMENT & PLAN:  BREIANA STRATMANN is a 75 y.o. female with    1. Malignant neoplasm of upper-outer quadrant of left breast, invasive lobular carcinoma stage IA, pT2N0M0, ER+/PR+/HER2-, Grade , RS 12 -She was diagnosed in 08/2020 with a 1.2cm invasive lobular carcinoma, grade I.  -She underwent left lumpectomy with SLNB by Dr Marlou Starks on 10/05/20. Her surgical pathology showed 2.3 cm of grade I lobular carcinoma was complete resected, clean margins, node negative. I reviewed with patient today.  -Her Oncotype showed low risk with RS 12 with distant recurrence risk at 9 years with AI or Tamoxifen at 3%. Adjuvant chemo not recommended -s/p adjuvant radiation -she is on Tamoxifen now, tolerating well overall  -Lab reviewed, she has chronic mild leukopenia, no other concerns. -She is clinically doing well, exam was unremarkable, no clinical concern for recurrence -Continue tamoxifen, follow-up in 4 months, then every 6 months   2. Comorbidities: Rheumatoid arthritis, Hypothyroidism -She does not f/u with Rheumatologist given mild symptoms. She does not require pain medication -On thyroid replacement.   3. Osteopenia -Her 06/2019 DEXA showed osteopenia in left hip with T-score -1.6 and 2.8% risk of hip frax. She continues to monitor at [facility] every 2 years.  -I discussed on AI this can weaken further and with Tamoxifen it can strengthen her bone. She plans to proceed with Tamoxifen first.   4. Genetics  -Patient notes 2-3 aunts with h/o breast cancer. I discussed she is eligible for genetic testing. She will consider but her daughter feels she does not need to  proceed as they are on top of breast cancer screenings.     PLAN:  -Continue tamoxifen -Lab and follow-up with Lacie in 4 months -Mammogram [facility] in September 2022   No problem-specific Assessment & Plan notes found for this encounter.   No orders of the defined types were placed in this encounter.  All questions were answered. The patient knows to call the clinic with any problems, questions or concerns. No barriers to  learning was detected. The total time spent in the appointment was 30 minutes.     Truitt Merle, MD 06/06/2021   I, Joslyn Devon, am acting as scribe for Truitt Merle, MD.   I have reviewed the above documentation for accuracy and completeness, and I agree with the above.

## 2021-06-07 ENCOUNTER — Ambulatory Visit (INDEPENDENT_AMBULATORY_CARE_PROVIDER_SITE_OTHER): Payer: Medicare Other | Admitting: Family Medicine

## 2021-06-07 ENCOUNTER — Telehealth: Payer: Self-pay

## 2021-06-07 ENCOUNTER — Other Ambulatory Visit: Payer: Self-pay

## 2021-06-07 ENCOUNTER — Encounter: Payer: Self-pay | Admitting: Family Medicine

## 2021-06-07 VITALS — BP 122/70 | HR 84 | Temp 97.5°F | Ht 66.5 in | Wt 134.4 lb

## 2021-06-07 DIAGNOSIS — E559 Vitamin D deficiency, unspecified: Secondary | ICD-10-CM

## 2021-06-07 DIAGNOSIS — M069 Rheumatoid arthritis, unspecified: Secondary | ICD-10-CM

## 2021-06-07 DIAGNOSIS — E039 Hypothyroidism, unspecified: Secondary | ICD-10-CM

## 2021-06-07 DIAGNOSIS — Z0000 Encounter for general adult medical examination without abnormal findings: Secondary | ICD-10-CM | POA: Diagnosis not present

## 2021-06-07 DIAGNOSIS — D72819 Decreased white blood cell count, unspecified: Secondary | ICD-10-CM

## 2021-06-07 DIAGNOSIS — M8589 Other specified disorders of bone density and structure, multiple sites: Secondary | ICD-10-CM

## 2021-06-07 DIAGNOSIS — D696 Thrombocytopenia, unspecified: Secondary | ICD-10-CM

## 2021-06-07 DIAGNOSIS — E7849 Other hyperlipidemia: Secondary | ICD-10-CM

## 2021-06-07 MED ORDER — LEVOTHYROXINE SODIUM 75 MCG PO TABS: 75.0000 ug | ORAL_TABLET | Freq: Every day | ORAL | 3 refills | Status: DC

## 2021-06-07 NOTE — Telephone Encounter (Signed)
Pt left v/m was seen earlier today for annual exam and when pt was reviewing lab results pt does not see magnesium in results and pt wanted magnesium checked pt request cb.

## 2021-06-07 NOTE — Telephone Encounter (Signed)
Pt said she had a discuss before labs about doing the magnesium because we were doing labs her oncologist needed also. I do see the notes on the lab order request saying:  "Regarding: Lab orders for Tuesday, 7.12.22 Patient is scheduled for CPX labs, please order future labs, Thanks , [name]   Dr Burr Medico wanted a magnesium, too"    Pt said she didn't have a reason but since we told her we were going to do labs Dr. Burr Medico wanted she thought that would be included. Pt said she wasn't coming back in for another lab draw to check it she just wanted to know why it wasn't done. Pt said she will just discuss this with oncologist and nothing further needs to be done on our end

## 2021-06-07 NOTE — Patient Instructions (Addendum)
Try to get protein with meals  Meat, dairy, nuts, nut butters   Vitamin D is very important for bone and overall healthy  Try and get 4000-5000 iu of vitamin D3 daily   I want to increase your thyroid medicine to 75 mcg daily  We need to re check your thyroid number again in 6 weeks   Take care of yourself   Drink lots of fluids for kidney health

## 2021-06-07 NOTE — Telephone Encounter (Signed)
Not usually done as part of physical labs.  Does she have a diagnosis for it?/reason

## 2021-06-07 NOTE — Telephone Encounter (Signed)
Thanks - I did not see that. So sorry

## 2021-06-07 NOTE — Progress Notes (Signed)
Subjective:    Patient ID: Jennifer Sellers, female    DOB: [birth year], 75 y.o.   MRN: [id number]  This visit occurred during the SARS-CoV-2 public health emergency.  Safety protocols were in place, including screening questions prior to the visit, additional usage of staff PPE, and extensive cleaning of exam room while observing appropriate contact time as indicated for disinfecting solutions.   HPI Here for health maintenance exam and to review chronic medical problems    Wt Readings from Last 3 Encounters:  06/07/21 134 lb 7 oz (61 kg)  06/06/21 137 lb 4.8 oz (62.3 kg)  03/13/21 140 lb 4.8 oz (63.6 kg)   21.37 kg/m Not much on protein   Trying to hang in there  Feeling pretty good overall   Zoster status - declines shingrix for now   Covid immunized  Does not get flu shots  Td 3/19 Had bad prevnar rxn-no pneumovax  Declines shingrix for now   Mammogram 9/21- breast cancer Taking nolvadex (tamoxifen)- doing ok  Self breast exam - nothing new  Has exams with oncology  Dexa 8/20 - has already scheduled next one  Osteopenia Falls -none Fractures-none Supplements - none  Exercise - active /on feet a lot /cleaning and household  D level is low at 24.3  Skin condition -used poison ivy and irritated skin on R arm  Rash R arm-itches  Bump on R wrist    Hypothyroidism  Pt has no clinical changes No change in energy level/ hair or skin/ edema and no tremor Lab Results  Component Value Date   TSH 10.99 (H) 05/28/2021    Takes levothyroxine 50 mcg daily   Ca is 8.3 with low nl albumin   Hyperlipidemia Lab Results  Component Value Date   CHOL 183 05/28/2021   CHOL 205 (H) 05/23/2020   CHOL 201 (H) 05/09/2019   Lab Results  Component Value Date   HDL 57.30 05/28/2021   HDL 60.90 05/23/2020   HDL 60.20 05/09/2019   Lab Results  Component Value Date   LDLCALC 115 (H) 05/28/2021   LDLCALC 131 (H) 05/23/2020   LDLCALC 123 (H) 05/09/2019   Lab Results   Component Value Date   TRIG 58.0 05/28/2021   TRIG 65.0 05/23/2020   TRIG 90.0 05/09/2019   Lab Results  Component Value Date   CHOLHDL 3 05/28/2021   CHOLHDL 3 05/23/2020   CHOLHDL 3 05/09/2019   Lab Results  Component Value Date   LDLDIRECT 163.4 11/11/2013   LDLDIRECT 152.5 08/14/2011   LDLDIRECT 145.0 04/27/2007  Improved LDL  Good lipo science prof in past  Good diet/fruits and vegetables    Lab Results  Component Value Date   WBC 2.4 Repeated and verified X2. (L) 05/28/2021   HGB 12.3 05/28/2021   HCT 34.7 (L) 05/28/2021   MCV 93.8 05/28/2021   PLT 128.0 (L) 05/28/2021  Due to RA-low wbc and platelets Strong family h/o lymphoma   Lab Results  Component Value Date   CREATININE 1.01 05/28/2021   BUN 17 05/28/2021   NA 141 05/28/2021   K 4.3 05/28/2021   CL 108 05/28/2021   CO2 27 05/28/2021   Patient Active Problem List   Diagnosis Date Noted   Malignant neoplasm of upper-outer quadrant of left breast in female, estrogen receptor positive ([place]) 08/24/2020   Estrogen deficiency 05/24/2019   Vitamin D deficiency 08/03/2015   Routine general medical examination at a health care facility 05/15/2015   Dyspepsia 08/23/2014  Abnormal ankle brachial index 08/23/2014   Rheumatoid arthritis ([place]) 06/19/2014   Thrombocytopenia ([place]) 06/19/2014   Encounter for Medicare annual wellness exam 11/11/2013   Hyperbilirubinemia 08/19/2013   Hypothyroid 09/21/2012   NEOPLASMS UNSPEC NATURE BONE SOFT TISSUE&SKIN 10/23/2010   GOUTY TOPHI 10/23/2010   OTHER SYMPTOMS REFERABLE JOINT SITE UNSPECIFIED 10/23/2010   CARPAL TUNNEL SYNDROME, HX OF 04/05/2009   Leukocytopenia 05/02/2008   DRY MOUTH 04/28/2008   INSOMNIA, TRANSIENT 04/27/2007   Osteopenia 04/27/2007   Hyperlipidemia 04/23/2007   HIATAL HERNIA 04/23/2007   POSTMENOPAUSAL STATUS 04/23/2007   RHEUMATOID FACTOR, POSITIVE 04/23/2007   Past Medical History:  Diagnosis Date   Arthritis    rheumatoid arthritis    Hypothyroidism    Leukopenia    Thyroid disease    TIA (transient ischemic attack) 2011   Past Surgical History:  Procedure Laterality Date   ABDOMINAL HYSTERECTOMY     BREAST LUMPECTOMY WITH RADIOACTIVE SEED AND SENTINEL LYMPH NODE BIOPSY Left 10/05/2020   Procedure: LEFT BREAST LUMPECTOMY WITH RADIOACTIVE SEED AND SENTINEL LYMPH NODE BIOPSY;  Surgeon: Jovita Kussmaul, MD;  Location: [place];  Service: General;  Laterality: Left;  GENERAL AND PECTORAL BLOCK   CARPAL TUNNEL RELEASE     COLONOSCOPY     COLONOSCOPY WITH PROPOFOL N/A 06/30/2018   Procedure: COLONOSCOPY WITH PROPOFOL;  Surgeon: Manya Silvas, MD;  Location: [facility] ENDOSCOPY;  Service: Endoscopy;  Laterality: N/A;   colpo-ureteroscopy     lens surgery     Social History   Tobacco Use   Smoking status: Never   Smokeless tobacco: Never  Vaping Use   Vaping Use: Never used  Substance Use Topics   Alcohol use: Yes    Alcohol/week: 0.0 standard drinks    Comment: occasional   Drug use: No   Family History  Problem Relation Age of Onset   Cancer Mother        lymphoma   Cancer Brother        lymphoma   Cancer Daughter        NHL   Allergies  Allergen Reactions   Cipro [Ciprofloxacin Hcl] Other (See Comments)    Unknown    Cortisporin [Bacitra-Neomycin-Polymyxin-Hc]     Burning / swelling of eyes with eye drops   Prevnar [Pneumococcal 13-Val Conj Vacc] Other (See Comments)    Severe local reaction    Sulfonamide Derivatives     REACTION: reaction not known   Current Outpatient Medications on File Prior to Visit  Medication Sig Dispense Refill   Cholecalciferol (OPTIMAL-D PO) Take 1 tablet by mouth daily.     Digestive Aids Mixture (DIGESTION GB) CAPS Take 1 capsule by mouth daily.     HYDROcodone-acetaminophen (NORCO/VICODIN) 5-325 MG tablet Take 1-2 tablets by mouth every 6 (six) hours as needed for moderate pain or severe pain. 15 tablet 0   Multiple Vitamins-Minerals (PRESERVISION  AREDS 2 PO) Take 2 capsules by mouth daily.      NON FORMULARY 2 drops daily. sustane     OVER THE COUNTER MEDICATION Optimal longevi D K2     OVER THE COUNTER MEDICATION Liqua A     tamoxifen (NOLVADEX) 20 MG tablet Take 1 tablet (20 mg total) by mouth daily. 30 tablet 6   No current facility-administered medications on file prior to visit.     Review of Systems  Constitutional:  Positive for fatigue. Negative for activity change, appetite change, fever and unexpected weight change.  HENT:  Negative for  congestion, ear pain, rhinorrhea, sinus pressure and sore throat.   Eyes:  Negative for pain, redness and visual disturbance.  Respiratory:  Negative for cough, shortness of breath and wheezing.   Cardiovascular:  Negative for chest pain and palpitations.  Gastrointestinal:  Negative for abdominal pain, blood in stool, constipation and diarrhea.  Endocrine: Negative for polydipsia and polyuria.  Genitourinary:  Negative for dysuria, frequency and urgency.  Musculoskeletal:  Positive for arthralgias. Negative for back pain and myalgias.  Skin:  Positive for rash. Negative for pallor.  Allergic/Immunologic: Negative for environmental allergies.  Neurological:  Negative for dizziness, syncope and headaches.  Hematological:  Negative for adenopathy. Does not bruise/bleed easily.  Psychiatric/Behavioral:  Negative for decreased concentration and dysphoric mood. The patient is not nervous/anxious.       Objective:   Physical Exam Constitutional:      General: She is not in acute distress.    Appearance: Normal appearance. She is well-developed. She is not ill-appearing or diaphoretic.  HENT:     Head: Normocephalic and atraumatic.     Right Ear: Tympanic membrane, ear canal and external ear normal.     Left Ear: Tympanic membrane, ear canal and external ear normal.     Nose: Nose normal. No congestion.     Mouth/Throat:     Mouth: Mucous membranes are moist.     Pharynx: Oropharynx is  clear. No posterior oropharyngeal erythema.  Eyes:     General: No scleral icterus.    Extraocular Movements: Extraocular movements intact.     Conjunctiva/sclera: Conjunctivae normal.     Pupils: Pupils are equal, round, and reactive to light.  Neck:     Thyroid: No thyromegaly.     Vascular: No carotid bruit or JVD.  Cardiovascular:     Rate and Rhythm: Normal rate and regular rhythm.     Pulses: Normal pulses.     Heart sounds: Normal heart sounds.    No gallop.  Pulmonary:     Effort: Pulmonary effort is normal. No respiratory distress.     Breath sounds: Normal breath sounds. No wheezing.     Comments: Good air exch Chest:     Chest wall: No tenderness.  Abdominal:     General: Bowel sounds are normal. There is no distension or abdominal bruit.     Palpations: Abdomen is soft. There is no mass.     Tenderness: There is no abdominal tenderness.     Hernia: No hernia is present.  Genitourinary:    Comments: Breast exam recently done by oncology Musculoskeletal:        General: No tenderness. Normal range of motion.     Cervical back: Normal range of motion and neck supple. No rigidity. No muscular tenderness.     Right lower leg: No edema.     Left lower leg: No edema.     Comments: No kyphosis   Medial joint swelling R wrist  Lymphadenopathy:     Cervical: No cervical adenopathy.  Skin:    General: Skin is warm and dry.     Coloration: Skin is not pale.     Findings: No erythema or rash.     Comments: Healing vesicular rash in linear pattern R arm consistent with poison ivy    Neurological:     Mental Status: She is alert. Mental status is at baseline.     Cranial Nerves: No cranial nerve deficit.     Motor: No abnormal muscle tone.  Coordination: Coordination normal.     Gait: Gait normal.     Deep Tendon Reflexes: Reflexes are normal and symmetric. Reflexes normal.  Psychiatric:        Mood and Affect: Mood normal.        Cognition and Memory: Cognition  and memory normal.          Assessment & Plan:   Problem List Items Addressed This Visit       Endocrine   Hypothyroid    Lab Results  Component Value Date   TSH 10.99 (H) 05/28/2021  Some fatigue Will inc levothyroxine to 75 mcg daily  Explained that we need to follow this  Lab planned 6 wk      Relevant Medications   levothyroxine (SYNTHROID) 75 MCG tablet   Other Relevant Orders   TSH     Musculoskeletal and Integument   Osteopenia    Due for dexa next month= is already scheduled  No falls or fx  Strongly enc to start back on vit D  Taking tamoxifen   Disc need for calcium/ vitamin D/ wt bearing exercise and bone density test every 2 y to monitor Disc safety/ fracture risk in detail         Rheumatoid arthritis ([place])    Pt has nodule on R wrist  Tolerating  Cbc changes persist  Does not desire rheumatology care at this point          Other   Hyperlipidemia    Disc goals for lipids and reasons to control them Rev last labs with pt Rev low sat fat diet in detail LDL is improved with better diet  Favorable lipo science profile in the past       Leukocytopenia    With mild thrombocytopenia Presumed from RA       Thrombocytopenia (HCC)    Mild No symptoms Likely due to RA Continue to monitor       Routine general medical examination at a health care facility - Primary    Reviewed health habits including diet and exercise and skin cancer prevention Reviewed appropriate screening tests for age  Also reviewed health mt list, fam hx and immunization status , as well as social and family history   See HPI Labs reviewed  Declines shingrix vaccine  Due to past rxn to prevnar, declines pneumovax covid immunized  In tx for breast cancer and mammogram per oncology dexa planned for next mo , no falls or fx Enc strongly to get back on vit D       Vitamin D deficiency    Level still low at 24.3 Enc strongly to get back on vit D 4000 to 5000  iu daily  Disc imp to bone and overall health

## 2021-06-09 NOTE — Assessment & Plan Note (Signed)
Lab Results  Component Value Date   TSH 10.99 (H) 05/28/2021   Some fatigue Will inc levothyroxine to 75 mcg daily  Explained that we need to follow this  Lab planned 6 wk

## 2021-06-09 NOTE — Assessment & Plan Note (Signed)
Reviewed health habits including diet and exercise and skin cancer prevention Reviewed appropriate screening tests for age  Also reviewed health mt list, fam hx and immunization status , as well as social and family history   See HPI Labs reviewed  Declines shingrix vaccine  Due to past rxn to prevnar, declines pneumovax covid immunized  In tx for breast cancer and mammogram per oncology dexa planned for next mo , no falls or fx Enc strongly to get back on vit D

## 2021-06-09 NOTE — Assessment & Plan Note (Signed)
With mild thrombocytopenia Presumed from RA

## 2021-06-09 NOTE — Assessment & Plan Note (Signed)
Due for dexa next month= is already scheduled  No falls or fx  Strongly enc to start back on vit D  Taking tamoxifen   Disc need for calcium/ vitamin D/ wt bearing exercise and bone density test every 2 y to monitor Disc safety/ fracture risk in detail

## 2021-06-09 NOTE — Assessment & Plan Note (Signed)
Pt has nodule on R wrist  Tolerating  Cbc changes persist  Does not desire rheumatology care at this point

## 2021-06-09 NOTE — Assessment & Plan Note (Signed)
Mild No symptoms Likely due to RA Continue to monitor

## 2021-06-09 NOTE — Assessment & Plan Note (Signed)
Disc goals for lipids and reasons to control them Rev last labs with pt Rev low sat fat diet in detail LDL is improved with better diet  Favorable lipo science profile in the past

## 2021-06-09 NOTE — Assessment & Plan Note (Signed)
Level still low at 24.3 Enc strongly to get back on vit D 4000 to 5000 iu daily  Disc imp to bone and overall health

## 2021-07-17 ENCOUNTER — Encounter: Payer: Self-pay | Admitting: Family Medicine

## 2021-07-19 ENCOUNTER — Other Ambulatory Visit: Payer: Medicare Other

## 2021-07-29 ENCOUNTER — Ambulatory Visit: Payer: Self-pay

## 2021-07-31 ENCOUNTER — Ambulatory Visit: Payer: Medicare Other | Attending: General Surgery

## 2021-07-31 ENCOUNTER — Other Ambulatory Visit: Payer: Self-pay

## 2021-07-31 VITALS — Wt 135.0 lb

## 2021-07-31 DIAGNOSIS — Z483 Aftercare following surgery for neoplasm: Secondary | ICD-10-CM | POA: Insufficient documentation

## 2021-07-31 NOTE — Therapy (Signed)
[facility] [place], [place], [address] Phone: [phone]   Fax:  [phone]  Physical Therapy Treatment  Patient Details  Name: ROBERT SUNGA MRN: [id number] Date of Birth: 02-10-1946 Referring Provider (PT): Dr. Autumn Messing   Encounter Date: 07/31/2021   PT End of Session - 07/31/21 1003     Visit Number 8   # unchanged due to screen only   PT Start Time 0957    PT Stop Time 1007    PT Time Calculation (min) 10 min    Activity Tolerance Patient tolerated treatment well    Behavior During Therapy [facility] for tasks assessed/performed             Past Medical History:  Diagnosis Date   Arthritis    rheumatoid arthritis   Hypothyroidism    Leukopenia    Thyroid disease    TIA (transient ischemic attack) 2011    Past Surgical History:  Procedure Laterality Date   ABDOMINAL HYSTERECTOMY     BREAST LUMPECTOMY WITH RADIOACTIVE SEED AND SENTINEL LYMPH NODE BIOPSY Left 10/05/2020   Procedure: LEFT BREAST LUMPECTOMY WITH RADIOACTIVE SEED AND SENTINEL LYMPH NODE BIOPSY;  Surgeon: Jovita Kussmaul, MD;  Location: [place];  Service: General;  Laterality: Left;  GENERAL AND PECTORAL BLOCK   CARPAL TUNNEL RELEASE     COLONOSCOPY     COLONOSCOPY WITH PROPOFOL N/A 06/30/2018   Procedure: COLONOSCOPY WITH PROPOFOL;  Surgeon: Manya Silvas, MD;  Location: [facility] ENDOSCOPY;  Service: Endoscopy;  Laterality: N/A;   colpo-ureteroscopy     lens surgery      Vitals:   07/31/21 0958  Weight: 135 lb (61.2 kg)     Subjective Assessment - 07/31/21 1001     Subjective Pt returns for her 3 month L-Dex screen.    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 Patient underwent a left lumpectomy and sentinel node biopsy (3 negative nodes) on 10/05/2020. It is ER/PR positive and HER2 negative with a Ki67 of 10%.                     L-DEX FLOWSHEETS - 07/31/21 1000       L-DEX LYMPHEDEMA SCREENING   Measurement Type Unilateral    L-DEX MEASUREMENT EXTREMITY Upper Extremity    POSITION  Standing    DOMINANT SIDE Right    At Risk Side Left    BASELINE SCORE (UNILATERAL) 1.3    L-DEX SCORE (UNILATERAL) 4.3    VALUE CHANGE (UNILAT) 3                                     PT Esh Term Goals - 01/18/21 1324       PT Saintjean TERM GOAL #1   Title Patient will demonstrate she has regained full shoulder ROM and function post operatively compared to baselines.    Time 4    Period Weeks    Status Achieved      PT Bonano TERM GOAL #2   Title Patient will report she is able to lay in the required postion for radiation without c/o increaed pain.    Time 4    Period Weeks    Status Achieved      PT Sirico TERM GOAL #3   Title Patient will improve her DASH score  to be </= 13.64 for improved overall arm function and to achieve baseline score.    Baseline pt contines with higher score not because she IS limited, but because she is concerned about over doing it.    Time 4    Period Weeks    Status Unable to assess   forgotten today     PT Harrel TERM GOAL #4   Title Patient will improve left shoulder active flexion and abduction to be >/= 140 degrees for increased ease reaching.    Time 4    Period Weeks    Status Achieved      PT Clemenson TERM GOAL #5   Title Patient will verbalize good understanding of lymphedema risk reduction practices.    Time 4    Period Weeks    Status Partially Met   pt requires occasional cueing     PT Mcb[place] TERM GOAL #6   Title Pt will be fit for prophylactic sleeve and gauntlet and will be able to don/doff independently and will be educated in proper times to wear    Time 2    Period Weeks    Status Achieved      PT Dudek TERM GOAL #7   Title Pts. compression bra will fit properly to enable reduction in edema    Time 3    Period Weeks    Status Achieved      PT Amador  TERM GOAL #8   Title Pt will be independent in self breast MLD and scar massage to reduce fibrosis/edema    Time 4    Period Weeks    Status Partially Met   requires occasional VC's                  Plan - 07/31/21 1007     Clinical Impression Statement Pt returns for her 3 month L-Dex screen. Her change from baseline of 3 is WNLs so no further treatment is required at this time except to cont every 3 month L-Dex screens which pt is agreeable to.    PT Next Visit Plan Cont every 3 month L-Dex screens for up to 2 years from her SLNB.    Consulted and Agree with Plan of Care Patient             Patient will benefit from skilled therapeutic intervention in order to improve the following deficits and impairments:     Visit Diagnosis: Aftercare following surgery for neoplasm     Problem List Patient Active Problem List   Diagnosis Date Noted   Malignant neoplasm of upper-outer quadrant of left breast in female, estrogen receptor positive ([place]) 08/24/2020   Estrogen deficiency 05/24/2019   Vitamin D deficiency 08/03/2015   Routine general medical examination at a health care facility 05/15/2015   Dyspepsia 08/23/2014   Abnormal ankle brachial index 08/23/2014   Rheumatoid arthritis ([place]) 06/19/2014   Thrombocytopenia ([place]) 06/19/2014   Encounter for Medicare annual wellness exam 11/11/2013   Hyperbilirubinemia 08/19/2013   Hypothyroid 09/21/2012   NEOPLASMS UNSPEC NATURE BONE SOFT TISSUE&SKIN 10/23/2010   GOUTY TOPHI 10/23/2010   OTHER SYMPTOMS REFERABLE JOINT SITE UNSPECIFIED 10/23/2010   CARPAL TUNNEL SYNDROME, HX OF 04/05/2009   Leukocytopenia 05/02/2008   DRY MOUTH 04/28/2008   INSOMNIA, TRANSIENT 04/27/2007   Osteopenia 04/27/2007   Hyperlipidemia 04/23/2007   HIATAL HERNIA 04/23/2007   POSTMENOPAUSAL STATUS 04/23/2007   RHEUMATOID FACTOR, POSITIVE 04/23/2007    Otelia Limes, PTA 07/31/2021, 10:09 AM  [facility]  [place], [place], [address] Phone: [phone]   Fax:  [phone]  Name: VERNELLA NIZNIK MRN: [id number] Date of Birth: [birth year]

## 2021-08-29 ENCOUNTER — Other Ambulatory Visit: Payer: Self-pay | Admitting: Hematology

## 2021-08-29 DIAGNOSIS — C50412 Malignant neoplasm of upper-outer quadrant of left female breast: Secondary | ICD-10-CM

## 2021-10-01 ENCOUNTER — Other Ambulatory Visit: Payer: Self-pay | Admitting: *Deleted

## 2021-10-01 DIAGNOSIS — Z170 Estrogen receptor positive status [ER+]: Secondary | ICD-10-CM

## 2021-10-01 DIAGNOSIS — C50412 Malignant neoplasm of upper-outer quadrant of left female breast: Secondary | ICD-10-CM

## 2021-10-01 NOTE — Progress Notes (Signed)
[facility]   [phone] [phone]   Clinic Follow up Note   Patient Care Team: Tower, Wynelle Fanny, MD as PCP - General Lad, Caesar Bookman, MD as Referring Physician (Ophthalmology) Birder Robson, MD as Referring Physician (Ophthalmology) Worthy Keeler, DC as Consulting Physician (Chiropractic Medicine) Mauro Kaufmann, RN as Oncology Nurse Navigator Rockwell Germany, RN as Oncology Nurse Navigator Jovita Kussmaul, MD as Consulting Physician (General Surgery) Truitt Merle, MD as Consulting Physician (Hematology) Kyung Rudd, MD as Consulting Physician (Radiation Oncology) Alla Feeling, NP as Nurse Practitioner (Nurse Practitioner) 10/02/2021  CHIEF COMPLAINT: Follow up left breast cancer   SUMMARY OF ONCOLOGIC HISTORY: Oncology History Overview Note  Cancer Staging Malignant neoplasm of upper-outer quadrant of left breast in female, estrogen receptor positive ([place]) Staging form: Breast, AJCC 8th Edition - Clinical stage from 08/29/2020: Stage IA (cT1c, cN0, cM0, G1, ER+, PR+, HER2-) - Unsigned - Pathologic stage from 10/31/2020: Stage IA (pT2, pN0(sn), cM0, G1, ER+, PR+, HER2-, Oncotype DX score: 12) - Unsigned    Malignant neoplasm of upper-outer quadrant of left breast in female, estrogen receptor positive ([place])  08/02/2020 Mammogram   IMPRESSION The 1cm (0.9x1.2x0.9cm on US)asymmetry in the left breast at 1:00 position, 4 cmfn is indeterminate   08/22/2020 Initial Biopsy   Diagnosis 08/22/20 Breast, left, needle core biopsy, 1 o'clock, 4cmfn -INVASIVE MAMMARY CARCINOMA -SEE COMMENT       -Grade 1 or 3  E-cadherin is NEGATIVE supporting lobular origin   08/22/2020 Receptors her2   ER - 95%  PR - 25%  Ki67 - 10% HER2 Negative on [facility]   08/24/2020 Initial Diagnosis   Malignant neoplasm of upper-outer quadrant of left breast in female, estrogen receptor positive ([place])   08/29/2020 Breast MRI   IMPRESSION: 1. Enhancing mass in the  upper slightly outer left breast measuring approximately 2.4 x 1.6 x 1.8 cm, consistent with the patient's known malignancy. 2. No MRI evidence of malignancy in the right breast. 3. No suspicious lymphadenopathy.   10/05/2020 Surgery   LEFT BREAST LUMPECTOMY WITH RADIOACTIVE SEED AND SENTINEL LYMPH NODE BIOPSY by Dr Marlou Starks    10/05/2020 Pathology Results   FINAL MICROSCOPIC DIAGNOSIS:   A. LYMPH NODE, LEFT AXILLARY #1, SENTINEL, EXCISION:  -  No carcinoma identified in one lymph node (0/1)  -  See comment   B. LYMPH NODE, LEFT AXILLARY, SENTINEL, EXCISION:  -  No carcinoma identified in one lymph node (0/1)  -  See comment   C. LYMPH NODE, LEFT AXILLARY #2, SENTINEL, EXCISION:  -  No carcinoma identified in one lymph node (0/1)  -  See comment   D. BREAST, LEFT, LUMPECTOMY:  -  Invasive lobular carcinoma, Nottingham grade 1 of 3, 2.3 cm  -  Margins uninvolved by carcinoma (<0.1 cm; superior margin)  -  Previous biopsy site changes present  -  See oncology table and comment below    10/05/2020 Oncotype testing   Oncoytpe  Recurrence Score 12 with distant recurrence risk at 9 years with AI or Tamoxifen at 3%  There is less than 1% benefit of chemotherapy.    11/19/2020 - 12/14/2020 Radiation Therapy   Adjuvant Radiation with Dr Lisbeth Renshaw    12/2020 -  Anti-estrogen oral therapy   Tamoxifen $RemoveBe'20mg'ZYCbOSegR$  daily starting 12/2020    03/13/2021 Survivorship   SCP delivered by Cira Rue, NP     CURRENT THERAPY: Tamoxifen 20 mg daily starting 12/2020  INTERVAL HISTORY: Ms. Riggio returns for  follow up as scheduled. Last seen by Dr. Burr Medico 06/06/21. Mammogram 07/17/21 was negative/normal with a breast density category C. She continues tamoxifen.  She had pain in the left hand and wrist about a month ago, wore a brace and this resolved, denies other bone or joint pain.  Denies concerns in her breast such as new lump/mass, nipple discharge or inversion, or skin change.  She recently had a mammogram and  bone density scan, she does not take calcium or vitamin D.  Recently she developed left greater than right leg swelling mostly at night and improves by morning.  She denies calf pain or cramping.  She also recalls becoming short of breath seemingly overnight about 3 months ago.  Denies recent infection, cough, chest pain.  No history of asthma, bronchitis, pneumonia, or other lung problems.  She is active, works in the yard, notices this mainly when she is going up a hill.  Otherwise denies hot flashes, GI/GYN bleeding, change in bowel habits, fever, chills, infection, or other new specific complaints.   MEDICAL HISTORY:  Past Medical History:  Diagnosis Date   Arthritis    rheumatoid arthritis   Hypothyroidism    Leukopenia    Thyroid disease    TIA (transient ischemic attack) 2011    SURGICAL HISTORY: Past Surgical History:  Procedure Laterality Date   ABDOMINAL HYSTERECTOMY     BREAST LUMPECTOMY WITH RADIOACTIVE SEED AND SENTINEL LYMPH NODE BIOPSY Left 10/05/2020   Procedure: LEFT BREAST LUMPECTOMY WITH RADIOACTIVE SEED AND SENTINEL LYMPH NODE BIOPSY;  Surgeon: Jovita Kussmaul, MD;  Location: [facility];  Service: General;  Laterality: Left;  GENERAL AND PECTORAL BLOCK   CARPAL TUNNEL RELEASE     COLONOSCOPY     COLONOSCOPY WITH PROPOFOL N/A 06/30/2018   Procedure: COLONOSCOPY WITH PROPOFOL;  Surgeon: Manya Silvas, MD;  Location: [facility] ENDOSCOPY;  Service: Endoscopy;  Laterality: N/A;   colpo-ureteroscopy     lens surgery      I have reviewed the social history and family history with the patient and they are unchanged from previous note.  ALLERGIES:  is allergic to cipro [ciprofloxacin hcl], cortisporin [bacitra-neomycin-polymyxin-hc], prevnar [pneumococcal 13-val conj vacc], and sulfonamide derivatives.  MEDICATIONS:  Current Outpatient Medications  Medication Sig Dispense Refill   Cholecalciferol (OPTIMAL-D PO) Take 1 tablet by mouth daily.     Digestive  Aids Mixture (DIGESTION GB) CAPS Take 1 capsule by mouth daily.     HYDROcodone-acetaminophen (NORCO/VICODIN) 5-325 MG tablet Take 1-2 tablets by mouth every 6 (six) hours as needed for moderate pain or severe pain. 15 tablet 0   levothyroxine (SYNTHROID) 75 MCG tablet Take 1 tablet (75 mcg total) by mouth daily. 90 tablet 3   Multiple Vitamins-Minerals (PRESERVISION AREDS 2 PO) Take 2 capsules by mouth daily.      NON FORMULARY 2 drops daily. sustane     OVER THE COUNTER MEDICATION Optimal longevi D K2     OVER THE COUNTER MEDICATION Liqua A     tamoxifen (NOLVADEX) 20 MG tablet TAKE 1 TABLET BY MOUTH EVERY DAY 90 tablet 2   No current facility-administered medications for this visit.    PHYSICAL EXAMINATION: ECOG PERFORMANCE STATUS: 1 - Symptomatic but completely ambulatory  Vitals:   10/02/21 1017  BP: 128/90  Pulse: 73  Resp: 17  Temp: 98.2 F (36.8 C)  SpO2: 99%   Filed Weights   10/02/21 1017  Weight: 137 lb 1.6 oz (62.2 kg)    GENERAL:alert, no  distress and comfortable SKIN: No rash EYES: sclera clear NECK: Without mass LYMPH:  no palpable cervical or supraclavicular lymphadenopathy.  No left upper extremity lymphedema LUNGS: clear with normal breathing effort HEART: regular rate & rhythm, mild left greater than right lower extremity edema.  No calf tenderness ABDOMEN:abdomen soft, non-tender and normal bowel sounds Musculoskeletal: Focal spinal tenderness NEURO: alert & oriented x 3 with fluent speech, no focal motor/sensory deficits Breast exam: Breasts are asymmetric without nipple discharge or inversion.  S/p left lumpectomy, incisions completely healed.  Mild edema in the lower inner quadrant.  No palpable mass or nodularity in either breast or axilla that I could appreciate  LABORATORY DATA:  I have reviewed the data as listed CBC Latest Ref Rng & Units 10/02/2021 05/28/2021 08/29/2020  WBC 4.0 - 10.5 K/uL 2.3(L) 2.4 Repeated and verified X2.(L) 3.1(L)   Hemoglobin 12.0 - 15.0 g/dL 12.0 12.3 12.0  Hematocrit 36.0 - 46.0 % 35.1(L) 34.7(L) 35.0(L)  Platelets 150 - 400 K/uL 131(L) 128.0(L) 151     CMP Latest Ref Rng & Units 10/02/2021 05/28/2021 08/29/2020  Glucose 70 - 99 mg/dL 77 91 86  BUN 8 - 23 mg/dL $Remove'15 17 10  'KWogcTk$ Creatinine 0.44 - 1.00 mg/dL 0.87 1.01 0.93  Sodium 135 - 145 mmol/L 142 141 142  Potassium 3.5 - 5.1 mmol/L 4.3 4.3 4.0  Chloride 98 - 111 mmol/L 111 108 106  CO2 22 - 32 mmol/L 24 27 32  Calcium 8.9 - 10.3 mg/dL 8.3(L) 8.3(L) 8.9  Total Protein 6.5 - 8.1 g/dL 6.3(L) 5.9(L) 6.6  Total Bilirubin 0.3 - 1.2 mg/dL 1.3(H) 1.0 2.1(H)  Alkaline Phos 38 - 126 U/L 65 58 79  AST 15 - 41 U/L $Remo'19 15 18  'FCdCd$ ALT 0 - 44 U/L $Remo'11 7 8      'UYUXT$ RADIOGRAPHIC STUDIES: I have personally reviewed the radiological images as listed and agreed with the findings in the report. No results found.   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 Marlou Starks on 10/05/20 and adjuvant radiation. Due to low oncotype and lobular histology which is less sensitive to chemo, adjuvant chemo was not recommended -She began adjuvant tamoxifen 12/2020, tolerating mostly well overall. She has developed mild L>R leg edema and shortness of breath, unclear if related to tamoxifen. Otherwise no significant SE's -Ms. Yarde is clinically doing well, breast exam and 06/2021 mammogram were unremarkable, labs are stable.  -no clinical concern for breast cancer recurrence -next routine f/up in 6 months, or sooner if needed   Leg edema -mild, recent onset.  -I encouraged her to avoid salt, increase protein, elevate legs, wear compression stockings, and mobilize -given risk of thrombosis on tamoxifen, will get doppler to r/o DVT although my suspicion is low   Dyspnea -Acute onset 3 months ago, denies h/o chronic infection,  asthma, copd, or acute illness. No cough or chest pain -pulm exam is benign, no hypoxia on ambulation today in clinic (remained 98%). Could be exercise intolerance due to age? -today's chest xray looks unremarkable, will wait for final report   Osteopenia  -Lowest T score on 06/2021 was -1.6 at L femur neck. Her bone density in R femur neck improved from 2020 to now. However, her frax hip score increased, now 4% -she is not on calcium or vitamin D, I recommend to start now -we discussed  the bone strengthening quality of tamoxifen -I reviewed bisphosphonates and their potential risk/benefit and SE's, she declined for now -repeat 06/2023  Chronic leukopenia, hyperbilirubinemia -Stable since at least 2015 -reviewed infection precautions and recommended a flu vaccine today, she declined -monitor labs in 3 months   PLAN: -Labs reviewed, chronic leukopenia and hyperbilirubinemia, mild thrombocytopenia. Monitor lab 3 months -06/2021 mammo negative, no concern for recurrence, continue surveillance  -Continue tamoxifen and surveillance  -Discussed DEXA and bisphosphonates, pt declined. Start calcium/vitamin D -Chest xray today for dyspnea, will call with results -Doppler in the next week for L>R leg edema.  -begin compression stockings, avoid salt, increase protein, mobilize, elevate legs when sitting -lab in 3 months, f/up 6 months if work up is negative     Orders Placed This Encounter  Procedures   DG Chest 2 View    Standing Status:   Future    Number of Occurrences:   1    Standing Expiration Date:   10/02/2022    Order Specific Question:   Reason for Exam (SYMPTOM  OR DIAGNOSIS REQUIRED)    Answer:   shortness of breath x3 months    Order Specific Question:   Preferred imaging location?    Answer:   [facility]    All questions were answered. The patient knows to call the clinic with any problems, questions or concerns. No barriers to learning were detected. Total encounter  time was 40 minutes.      Alla Feeling, NP 10/02/21

## 2021-10-02 ENCOUNTER — Other Ambulatory Visit: Payer: Self-pay

## 2021-10-02 ENCOUNTER — Encounter: Payer: Self-pay | Admitting: Nurse Practitioner

## 2021-10-02 ENCOUNTER — Ambulatory Visit (HOSPITAL_COMMUNITY)
Admission: RE | Admit: 2021-10-02 | Discharge: 2021-10-02 | Disposition: A | Discharge status: Home / Self Care | Payer: Medicare Other | Source: Ambulatory Visit | Attending: Nurse Practitioner | Admitting: Nurse Practitioner

## 2021-10-02 ENCOUNTER — Inpatient Hospital Stay: Payer: Medicare Other | Attending: Nurse Practitioner | Admitting: Nurse Practitioner

## 2021-10-02 ENCOUNTER — Inpatient Hospital Stay: Payer: Medicare Other

## 2021-10-02 VITALS — BP 128/90 | HR 73 | Temp 98.2°F | Resp 17 | Ht 66.5 in | Wt 137.1 lb

## 2021-10-02 DIAGNOSIS — R600 Localized edema: Secondary | ICD-10-CM

## 2021-10-02 DIAGNOSIS — C50412 Malignant neoplasm of upper-outer quadrant of left female breast: Secondary | ICD-10-CM

## 2021-10-02 DIAGNOSIS — Z170 Estrogen receptor positive status [ER+]: Secondary | ICD-10-CM | POA: Insufficient documentation

## 2021-10-02 LAB — CBC WITH DIFFERENTIAL (CANCER CENTER ONLY)
Abs Immature Granulocytes: 0 10*3/uL (ref 0.00–0.07)
Basophils Absolute: 0 10*3/uL (ref 0.0–0.1)
Basophils Relative: 1 %
Eosinophils Absolute: 0.1 10*3/uL (ref 0.0–0.5)
Eosinophils Relative: 4 %
HCT: 35.1 % — ABNORMAL LOW (ref 36.0–46.0)
Hemoglobin: 12 g/dL (ref 12.0–15.0)
Immature Granulocytes: 0 %
Lymphocytes Relative: 30 %
Lymphs Abs: 0.7 10*3/uL (ref 0.7–4.0)
MCH: 32.3 pg (ref 26.0–34.0)
MCHC: 34.2 g/dL (ref 30.0–36.0)
MCV: 94.4 fL (ref 80.0–100.0)
Monocytes Absolute: 0.2 10*3/uL (ref 0.1–1.0)
Monocytes Relative: 9 %
Neutro Abs: 1.3 10*3/uL — ABNORMAL LOW (ref 1.7–7.7)
Neutrophils Relative %: 56 %
Platelet Count: 131 10*3/uL — ABNORMAL LOW (ref 150–400)
RBC: 3.72 MIL/uL — ABNORMAL LOW (ref 3.87–5.11)
RDW: 13 % (ref 11.5–15.5)
WBC Count: 2.3 10*3/uL — ABNORMAL LOW (ref 4.0–10.5)
nRBC: 0 % (ref 0.0–0.2)

## 2021-10-02 LAB — CMP (CANCER CENTER ONLY)
ALT: 11 U/L (ref 0–44)
AST: 19 U/L (ref 15–41)
Albumin: 3.5 g/dL (ref 3.5–5.0)
Alkaline Phosphatase: 65 U/L (ref 38–126)
Anion gap: 7 (ref 5–15)
BUN: 15 mg/dL (ref 8–23)
CO2: 24 mmol/L (ref 22–32)
Calcium: 8.3 mg/dL — ABNORMAL LOW (ref 8.9–10.3)
Chloride: 111 mmol/L (ref 98–111)
Creatinine: 0.87 mg/dL (ref 0.44–1.00)
GFR, Estimated: >60 mL/min (ref >60)
Glucose, Bld: 77 mg/dL (ref 70–99)
Potassium: 4.3 mmol/L (ref 3.5–5.1)
Sodium: 142 mmol/L (ref 135–145)
Total Bilirubin: 1.3 mg/dL — ABNORMAL HIGH (ref 0.3–1.2)
Total Protein: 6.3 g/dL — ABNORMAL LOW (ref 6.5–8.1)

## 2021-10-04 ENCOUNTER — Other Ambulatory Visit: Payer: Self-pay | Admitting: Family Medicine

## 2021-10-04 ENCOUNTER — Telehealth: Payer: Self-pay

## 2021-10-04 NOTE — Telephone Encounter (Deleted)
-----   Message from Alla Feeling, NP sent at 10/03/2021  1:08 PM EST ----- Please let her know chest xray is negative/normal, no fluid, infection, or other abnormality. I don't think it is related to tamoxifen, but if she'd like to hold it for 2-3 weeks to see if it resolves, she can try. If no improvement then restart tamoxifen. Please let me know if she wants to do that. I recommend to f/up with PCP for shortness of breath.   Thanks, Regan Rakers, NP

## 2021-10-04 NOTE — Telephone Encounter (Signed)
This nurse reached out to patient to inform her of her scheduled DVT for Monday 10/07/21 at 9 am per patient request.  Left message.  No further concerns at this time.  Will attempt to reach patient again.

## 2021-10-04 NOTE — Telephone Encounter (Signed)
PT LVM stating someone called her in regards to an appt she has on Monday 10/07/2021 but it was unclear as to when and where she's is supposed to go.  Returned pt's call but got her voicemail.  Informed the pt that based on her appt scheduled she is scheduled on 10/07/2021 @09am  for an Ultrasound of her left leg in the [place] pt to come to [facility] and check-in at the information desk and they will direct her as to where to got from there.  Tried contacting the pt's daughter Joelene Millin as well but left her a voicemail message stating the same information.

## 2021-10-07 ENCOUNTER — Ambulatory Visit (HOSPITAL_COMMUNITY)
Admission: RE | Admit: 2021-10-07 | Discharge: 2021-10-07 | Disposition: A | Discharge status: Home / Self Care | Payer: Medicare Other | Source: Ambulatory Visit | Attending: Nurse Practitioner | Admitting: Nurse Practitioner

## 2021-10-07 ENCOUNTER — Telehealth: Payer: Self-pay | Admitting: Family Medicine

## 2021-10-07 DIAGNOSIS — R600 Localized edema: Secondary | ICD-10-CM

## 2021-10-07 NOTE — Telephone Encounter (Signed)
Pt is overdue for a lab appt to recheck her TSH. Please schedule ASAP then route back to me to refill once appt has been made   (Non-fasting)

## 2021-10-07 NOTE — Progress Notes (Incomplete)
{  Select Note:[id number]} 

## 2021-10-07 NOTE — Telephone Encounter (Signed)
  Encourage patient to contact the pharmacy for refills or they can request refills through [place]:  Please schedule appointment if longer than 1 year  NEXT APPOINTMENT DATE:  MEDICATION:levothyroxine (SYNTHROID) 75 MCG tablet   Is the patient out of medication?   PHARMACY:[id number] - [place], [place]    Let patient know to contact pharmacy at the end of the day to make sure medication is ready.  Please notify patient to allow 48-72 hours to process  CLINICAL FILLS OUT ALL BELOW:   LAST REFILL:  QTY:  REFILL DATE:    OTHER COMMENTS:    Okay for refill?  Please advise

## 2021-10-08 ENCOUNTER — Other Ambulatory Visit: Payer: Self-pay

## 2021-10-08 ENCOUNTER — Other Ambulatory Visit (INDEPENDENT_AMBULATORY_CARE_PROVIDER_SITE_OTHER): Payer: Medicare Other

## 2021-10-08 ENCOUNTER — Telehealth: Payer: Self-pay | Admitting: *Deleted

## 2021-10-08 DIAGNOSIS — E039 Hypothyroidism, unspecified: Secondary | ICD-10-CM

## 2021-10-08 NOTE — Telephone Encounter (Signed)
Called pt with below message. Advised to call office if symptoms worsen. Pt verbalized understanding and request results be mailed to her.

## 2021-10-08 NOTE — Telephone Encounter (Signed)
-----   Message from Alla Feeling, NP sent at 10/07/2021  2:15 PM EST ----- Please let pt know doppler is negative for DVT. Swelling is likely multifactorial, maybe from tamoxifen. I recommend compression stockings, elevate legs when resting, mobilize, and add dietary protein. Call us if symptoms worsen or fail to improve.  Thanks, Regan Rakers, NP

## 2021-10-08 NOTE — Telephone Encounter (Signed)
Aware, pending result

## 2021-10-08 NOTE — Telephone Encounter (Signed)
Explained to pt that she is over due for a recheck of TSH due to it being very elevated at CPE in July. Per Dr. Marliss Coots note pt was due to a recheck in Aug/ Sept. Explained this to pt and she is going to [facility] to have labs rechecked (order is in) and we will advise what dose she should be on once labs come back. Pt has enough med for today and tomorrow when labs should be back.  FYI to PCP

## 2021-10-08 NOTE — Telephone Encounter (Signed)
Pt called to check on her refill I gave her message below she didn't want to schedule a lab appt she said she just had one in July and would like a call back

## 2021-10-09 LAB — TSH: TSH: 2.02 u[IU]/mL (ref 0.35–5.50)

## 2021-10-09 MED ORDER — LEVOTHYROXINE SODIUM 50 MCG PO TABS: 50.0000 ug | ORAL_TABLET | Freq: Every day | ORAL | 2 refills | Status: DC

## 2021-10-09 NOTE — Telephone Encounter (Addendum)
Pt called to check lab results because she only has 2 pills left. Since TSH level is back and it was in normal range I told her I can send in Rx. Pt said she is taking 50 mcg daily, Rx sent to pharmacy   FYI to PCP

## 2021-10-09 NOTE — Addendum Note (Signed)
Addended by: Tammi Sou on: 10/09/2021 04:31 PM   Modules accepted: Orders

## 2021-11-25 ENCOUNTER — Ambulatory Visit: Payer: Medicare Other

## 2021-12-31 ENCOUNTER — Encounter (HOSPITAL_COMMUNITY): Payer: Self-pay

## 2022-01-01 ENCOUNTER — Inpatient Hospital Stay: Payer: Medicare Other | Attending: Hematology

## 2022-01-01 ENCOUNTER — Other Ambulatory Visit: Payer: Self-pay

## 2022-01-01 DIAGNOSIS — C50412 Malignant neoplasm of upper-outer quadrant of left female breast: Secondary | ICD-10-CM | POA: Diagnosis not present

## 2022-01-01 DIAGNOSIS — D72819 Decreased white blood cell count, unspecified: Secondary | ICD-10-CM | POA: Diagnosis not present

## 2022-01-01 DIAGNOSIS — Z170 Estrogen receptor positive status [ER+]: Secondary | ICD-10-CM | POA: Diagnosis not present

## 2022-01-01 LAB — CBC WITH DIFFERENTIAL (CANCER CENTER ONLY)
Abs Immature Granulocytes: 0.01 10*3/uL (ref 0.00–0.07)
Basophils Absolute: 0 10*3/uL (ref 0.0–0.1)
Basophils Relative: 1 %
Eosinophils Absolute: 0 10*3/uL (ref 0.0–0.5)
Eosinophils Relative: 2 %
HCT: 35.5 % — ABNORMAL LOW (ref 36.0–46.0)
Hemoglobin: 11.9 g/dL — ABNORMAL LOW (ref 12.0–15.0)
Immature Granulocytes: 0 %
Lymphocytes Relative: 31 %
Lymphs Abs: 0.7 10*3/uL (ref 0.7–4.0)
MCH: 31.8 pg (ref 26.0–34.0)
MCHC: 33.5 g/dL (ref 30.0–36.0)
MCV: 94.9 fL (ref 80.0–100.0)
Monocytes Absolute: 0.2 10*3/uL (ref 0.1–1.0)
Monocytes Relative: 7 %
Neutro Abs: 1.3 10*3/uL — ABNORMAL LOW (ref 1.7–7.7)
Neutrophils Relative %: 59 %
Platelet Count: 127 10*3/uL — ABNORMAL LOW (ref 150–400)
RBC: 3.74 MIL/uL — ABNORMAL LOW (ref 3.87–5.11)
RDW: 13.7 % (ref 11.5–15.5)
WBC Count: 2.2 10*3/uL — ABNORMAL LOW (ref 4.0–10.5)
nRBC: 0 % (ref 0.0–0.2)

## 2022-01-01 LAB — CMP (CANCER CENTER ONLY)
ALT: 11 U/L (ref 0–44)
AST: 18 U/L (ref 15–41)
Albumin: 3.7 g/dL (ref 3.5–5.0)
Alkaline Phosphatase: 54 U/L (ref 38–126)
Anion gap: 3 — ABNORMAL LOW (ref 5–15)
BUN: 16 mg/dL (ref 8–23)
CO2: 28 mmol/L (ref 22–32)
Calcium: 8.3 mg/dL — ABNORMAL LOW (ref 8.9–10.3)
Chloride: 109 mmol/L (ref 98–111)
Creatinine: 0.87 mg/dL (ref 0.44–1.00)
GFR, Estimated: >60 mL/min (ref >60)
Glucose, Bld: 91 mg/dL (ref 70–99)
Potassium: 4.1 mmol/L (ref 3.5–5.1)
Sodium: 140 mmol/L (ref 135–145)
Total Bilirubin: 1.1 mg/dL (ref 0.3–1.2)
Total Protein: 6.3 g/dL — ABNORMAL LOW (ref 6.5–8.1)

## 2022-03-11 ENCOUNTER — Telehealth: Payer: Self-pay | Admitting: Family Medicine

## 2022-03-11 NOTE — Telephone Encounter (Signed)
Pt would like call back from a nurse regarding prolonged allergies and fatigue. I told her she would need an appointment.. but she wanted me to ask.  ?

## 2022-03-12 NOTE — Telephone Encounter (Signed)
Pt said she's had a cough and congestion for over a month and wanted to get it checked out during her CPE. I advised pt that a cough/congestion and CPE are 2 separate appt due to insurance and explained what we do during a CPE versus a sick appt. Also pt's CPE isn't due until after 06/08/22. I offered pt to get an appt scheduled with PCP now to check cough and congestion and then she can schedule her normal CPE in July when it's due. ? ?PCP has appts tomorrow and I offered her one or whenever she thinks she can get here. Pt declined scheduling anything right now, she said she will just see how she does at home and if she doesn't start to feel better then she will call back and get an appt but doesn't want to schedule anything right now. ? ?FYI to PCP  ?

## 2022-03-19 ENCOUNTER — Telehealth: Payer: Self-pay | Admitting: Hematology

## 2022-03-19 NOTE — Telephone Encounter (Signed)
Rescheduled upcoming appointment due to provider's breast clinic. Patient is aware of changes. 

## 2022-03-31 ENCOUNTER — Telehealth: Payer: Self-pay | Admitting: Family Medicine

## 2022-03-31 NOTE — Telephone Encounter (Signed)
Collagen should not interfere with tamoxifen or levothyroxine. There are some small studies in cancer patients that indicate potential for collagen to slow metastasis, but there are also studies that say it may promote metastasis in breast cancer - overall I would probably recommend to avoid it since there is no convincing research that it helps more than a healthy protein-rich diet, and there may be risks. ? ?

## 2022-03-31 NOTE — Telephone Encounter (Signed)
Do you know if collagen is studied/ any interactions known? ? ?Thanks  ?

## 2022-03-31 NOTE — Telephone Encounter (Addendum)
Pt called and stated, she was suggested to take a Collagen powder, she wants to know if it is okay to take this daily with her other medications below:  She wants to speak with the nurse as well. ? ?Medications: levothyroxine (SYNTHROID) 50 MCG tablet ? ?tamoxifen (NOLVADEX) 20 MG tablet (Cancer medication) ? ?Callback Number: [phone] ?

## 2022-03-31 NOTE — Telephone Encounter (Signed)
Thanks so much! ? ?Please let pt know that I conferred with out pharmacist and this was her response ? ?Collagen should not interfere with tamoxifen or levothyroxine. There are some small studies in cancer patients that indicate potential for collagen to slow metastasis, but there are also studies that say it may promote metastasis in breast cancer - overall I would probably recommend to avoid it since there is no convincing research that it helps more than a healthy protein-rich diet, and there may be risks. ? ?I think based on that it would be safer to hold off but that is her choice of course  ?

## 2022-04-02 ENCOUNTER — Ambulatory Visit: Payer: Medicare Other | Admitting: Hematology

## 2022-04-02 ENCOUNTER — Other Ambulatory Visit: Payer: Medicare Other

## 2022-04-10 ENCOUNTER — Inpatient Hospital Stay: Payer: Medicare Other | Attending: Hematology

## 2022-04-10 ENCOUNTER — Encounter: Payer: Self-pay | Admitting: Hematology

## 2022-04-10 ENCOUNTER — Inpatient Hospital Stay (HOSPITAL_BASED_OUTPATIENT_CLINIC_OR_DEPARTMENT_OTHER): Payer: Medicare Other | Admitting: Hematology

## 2022-04-10 ENCOUNTER — Other Ambulatory Visit: Payer: Self-pay

## 2022-04-10 VITALS — BP 117/79 | HR 73 | Temp 98.4°F | Resp 18 | Ht 66.5 in | Wt 134.2 lb

## 2022-04-10 DIAGNOSIS — E039 Hypothyroidism, unspecified: Secondary | ICD-10-CM | POA: Insufficient documentation

## 2022-04-10 DIAGNOSIS — Z90710 Acquired absence of both cervix and uterus: Secondary | ICD-10-CM | POA: Diagnosis not present

## 2022-04-10 DIAGNOSIS — Z853 Personal history of malignant neoplasm of breast: Secondary | ICD-10-CM | POA: Insufficient documentation

## 2022-04-10 DIAGNOSIS — D72819 Decreased white blood cell count, unspecified: Secondary | ICD-10-CM | POA: Insufficient documentation

## 2022-04-10 DIAGNOSIS — M85852 Other specified disorders of bone density and structure, left thigh: Secondary | ICD-10-CM | POA: Insufficient documentation

## 2022-04-10 DIAGNOSIS — C50412 Malignant neoplasm of upper-outer quadrant of left female breast: Secondary | ICD-10-CM

## 2022-04-10 DIAGNOSIS — M069 Rheumatoid arthritis, unspecified: Secondary | ICD-10-CM | POA: Diagnosis not present

## 2022-04-10 DIAGNOSIS — Z170 Estrogen receptor positive status [ER+]: Secondary | ICD-10-CM | POA: Diagnosis not present

## 2022-04-10 LAB — CBC WITH DIFFERENTIAL (CANCER CENTER ONLY)
Abs Immature Granulocytes: 0 10*3/uL (ref 0.00–0.07)
Basophils Absolute: 0 10*3/uL (ref 0.0–0.1)
Basophils Relative: 1 %
Eosinophils Absolute: 0 10*3/uL (ref 0.0–0.5)
Eosinophils Relative: 1 %
HCT: 36.4 % (ref 36.0–46.0)
Hemoglobin: 12.5 g/dL (ref 12.0–15.0)
Immature Granulocytes: 0 %
Lymphocytes Relative: 34 %
Lymphs Abs: 0.7 10*3/uL (ref 0.7–4.0)
MCH: 32.5 pg (ref 26.0–34.0)
MCHC: 34.3 g/dL (ref 30.0–36.0)
MCV: 94.5 fL (ref 80.0–100.0)
Monocytes Absolute: 0.2 10*3/uL (ref 0.1–1.0)
Monocytes Relative: 9 %
Neutro Abs: 1.2 10*3/uL — ABNORMAL LOW (ref 1.7–7.7)
Neutrophils Relative %: 55 %
Platelet Count: 141 10*3/uL — ABNORMAL LOW (ref 150–400)
RBC: 3.85 MIL/uL — ABNORMAL LOW (ref 3.87–5.11)
RDW: 13.4 % (ref 11.5–15.5)
WBC Count: 2.2 10*3/uL — ABNORMAL LOW (ref 4.0–10.5)
nRBC: 0 % (ref 0.0–0.2)

## 2022-04-10 LAB — CMP (CANCER CENTER ONLY)
ALT: 10 U/L (ref 0–44)
AST: 19 U/L (ref 15–41)
Albumin: 3.7 g/dL (ref 3.5–5.0)
Alkaline Phosphatase: 62 U/L (ref 38–126)
Anion gap: 4 — ABNORMAL LOW (ref 5–15)
BUN: 15 mg/dL (ref 8–23)
CO2: 28 mmol/L (ref 22–32)
Calcium: 8.6 mg/dL — ABNORMAL LOW (ref 8.9–10.3)
Chloride: 110 mmol/L (ref 98–111)
Creatinine: 1.12 mg/dL — ABNORMAL HIGH (ref 0.44–1.00)
GFR, Estimated: 50 mL/min — ABNORMAL LOW (ref >60)
Glucose, Bld: 71 mg/dL (ref 70–99)
Potassium: 4.1 mmol/L (ref 3.5–5.1)
Sodium: 142 mmol/L (ref 135–145)
Total Bilirubin: 1.2 mg/dL (ref 0.3–1.2)
Total Protein: 6.3 g/dL — ABNORMAL LOW (ref 6.5–8.1)

## 2022-04-10 MED ORDER — TAMOXIFEN CITRATE 20 MG PO TABS: 20.0000 mg | ORAL_TABLET | Freq: Every day | ORAL | 1 refills | Status: DC

## 2022-04-10 NOTE — Progress Notes (Signed)
[facility]   [phone] Fax:([phone]) [phone]   Clinic Follow up Note   Patient Care Team: Tower, Wynelle Fanny, MD as PCP - General Lad, Caesar Bookman, MD as Referring Physician (Ophthalmology) Birder Robson, MD as Referring Physician (Ophthalmology) Worthy Keeler, DC as Consulting Physician (Chiropractic Medicine) Mauro Kaufmann, RN as Oncology Nurse Navigator Rockwell Germany, RN as Oncology Nurse Navigator Jovita Kussmaul, MD as Consulting Physician (General Surgery) Truitt Merle, MD as Consulting Physician (Hematology) Kyung Rudd, MD as Consulting Physician (Radiation Oncology) Alla Feeling, NP as Nurse Practitioner (Nurse Practitioner)  Date of Service:  04/10/2022  CHIEF COMPLAINT: f/u of left breast cancer  CURRENT THERAPY:  Tamoxifen 20 mg daily, starting 12/2020  ASSESSMENT & PLAN:  CHARLENA HAUB is a 76 y.o. post-hysterectomy female with   1. Malignant neoplasm of upper-outer quadrant of left breast, invasive lobular carcinoma stage IA, pT2N0M0, ER+/PR+/HER2-, Grade , RS 12 -diagnosed in 08/2020. 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 Oncotype RS 12, low risk. S/p adjuvant radiation. -she began Tamoxifen 12/2020, tolerating well overall. -most recent mammogram 07/17/21 was benign. -She is clinically doing well. Labs reviewed, overall stable. Physical exam was unremarkable, no clinical concern for recurrence -Continue tamoxifen and follow-up every 6 months   2. Osteopenia -DEXA in 06/2019 showed osteopenia in left hip with T-score -1.6. Repeat on 07/17/21 was stable. -we previously discussed that tamoxifen can strengthen her bones. -she previously discussed bisphosphonate with NP Lacie at her last visit in 09/2021; she declined.  3. Comorbidities: Rheumatoid arthritis, Hypothyroidism, Chronic Leukopenia -She does not f/u with Rheumatologist given mild  symptoms. She does not require pain medication -On thyroid replacement.  -leukopenia likely related to her RA, stable between 2.2-3.     PLAN:  -Continue tamoxifen -Mammogram at [facility] in 07/2022 -Lab and follow-up with NP Lacie in 6 months   No problem-specific Assessment & Plan notes found for this encounter.   SUMMARY OF ONCOLOGIC HISTORY: Oncology History Overview Note  Cancer Staging Malignant neoplasm of upper-outer quadrant of left breast in female, estrogen receptor positive ([place]) Staging form: Breast, AJCC 8th Edition - Clinical stage from 08/29/2020: Stage IA (cT1c, cN0, cM0, G1, ER+, PR+, HER2-) - Unsigned - Pathologic stage from 10/31/2020: Stage IA (pT2, pN0(sn), cM0, G1, ER+, PR+, HER2-, Oncotype DX score: 12) - Unsigned    Malignant neoplasm of upper-outer quadrant of left breast in female, estrogen receptor positive ([place])  08/02/2020 Mammogram   IMPRESSION The 1cm (0.9x1.2x0.9cm on US)asymmetry in the left breast at 1:00 position, 4 cmfn is indeterminate   08/22/2020 Initial Biopsy   Diagnosis 08/22/20 Breast, left, needle core biopsy, 1 o'clock, 4cmfn -INVASIVE MAMMARY CARCINOMA -SEE COMMENT       -Grade 1 or 3  E-cadherin is NEGATIVE supporting lobular origin   08/22/2020 Receptors her2   ER - 95%  PR - 25%  Ki67 - 10% HER2 Negative on [facility]   08/24/2020 Initial Diagnosis   Malignant neoplasm of upper-outer quadrant of left breast in female, estrogen receptor positive ([place])   08/29/2020 Breast MRI   IMPRESSION: 1. Enhancing mass in the upper slightly outer left breast measuring approximately 2.4 x 1.6 x 1.8 cm, consistent with the patient's known malignancy. 2. No MRI evidence of malignancy in the right breast. 3. No suspicious lymphadenopathy.   10/05/2020 Surgery   LEFT BREAST LUMPECTOMY WITH RADIOACTIVE SEED AND SENTINEL LYMPH NODE BIOPSY  by Dr Marlou Starks    10/05/2020 Pathology Results   FINAL MICROSCOPIC DIAGNOSIS:   A. LYMPH NODE, LEFT AXILLARY  #1, SENTINEL, EXCISION:  -  No carcinoma identified in one lymph node (0/1)  -  See comment   B. LYMPH NODE, LEFT AXILLARY, SENTINEL, EXCISION:  -  No carcinoma identified in one lymph node (0/1)  -  See comment   C. LYMPH NODE, LEFT AXILLARY #2, SENTINEL, EXCISION:  -  No carcinoma identified in one lymph node (0/1)  -  See comment   D. BREAST, LEFT, LUMPECTOMY:  -  Invasive lobular carcinoma, Nottingham grade 1 of 3, 2.3 cm  -  Margins uninvolved by carcinoma (<0.1 cm; superior margin)  -  Previous biopsy site changes present  -  See oncology table and comment below    10/05/2020 Oncotype testing   Oncoytpe  Recurrence Score 12 with distant recurrence risk at 9 years with AI or Tamoxifen at 3%  There is less than 1% benefit of chemotherapy.    11/19/2020 - 12/14/2020 Radiation Therapy   Adjuvant Radiation with Dr Lisbeth Renshaw    12/2020 -  Anti-estrogen oral therapy   Tamoxifen $RemoveBe'20mg'RwosuoQMo$  daily starting 12/2020    03/13/2021 Survivorship   SCP delivered by Cira Rue, NP      INTERVAL HISTORY:  Elizeth Weinrich Mcbane is here for a follow up of breast cancer. She was last seen by NP Lacie on 10/02/21. She presents to the clinic alone. She reports her husband passed away recently, so she is dealing with that. She notes stable joint pain, likely from her RA. She also notes her memory "isn't what it used to be." She previously had dyspnea and LE edema at her last visit. She reports she hasn't had any SOB lately or edema.   All other systems were reviewed with the patient and are negative.  MEDICAL HISTORY:  Past Medical History:  Diagnosis Date   Arthritis    rheumatoid arthritis   Hypothyroidism    Leukopenia    Thyroid disease    TIA (transient ischemic attack) 2011    SURGICAL HISTORY: Past Surgical History:  Procedure Laterality Date   ABDOMINAL HYSTERECTOMY     BREAST LUMPECTOMY WITH RADIOACTIVE SEED AND SENTINEL LYMPH NODE BIOPSY Left 10/05/2020   Procedure: LEFT BREAST  LUMPECTOMY WITH RADIOACTIVE SEED AND SENTINEL LYMPH NODE BIOPSY;  Surgeon: Jovita Kussmaul, MD;  Location: [facility];  Service: General;  Laterality: Left;  GENERAL AND PECTORAL BLOCK   CARPAL TUNNEL RELEASE     COLONOSCOPY     COLONOSCOPY WITH PROPOFOL N/A 06/30/2018   Procedure: COLONOSCOPY WITH PROPOFOL;  Surgeon: Manya Silvas, MD;  Location: [facility] ENDOSCOPY;  Service: Endoscopy;  Laterality: N/A;   colpo-ureteroscopy     lens surgery      I have reviewed the social history and family history with the patient and they are unchanged from previous note.  ALLERGIES:  is allergic to cipro [ciprofloxacin hcl], cortisporin [bacitra-neomycin-polymyxin-hc], prevnar [pneumococcal 13-val conj vacc], and sulfonamide derivatives.  MEDICATIONS:  Current Outpatient Medications  Medication Sig Dispense Refill   Cholecalciferol (OPTIMAL-D PO) Take 1 tablet by mouth daily.     levothyroxine (SYNTHROID) 50 MCG tablet Take 1 tablet (50 mcg total) by mouth daily before breakfast. 90 tablet 2   Multiple Vitamins-Minerals (PRESERVISION AREDS 2 PO) Take 2 capsules by mouth daily.      NON FORMULARY 2 drops daily. sustane     OVER THE COUNTER MEDICATION Optimal  longevi D K2     OVER THE COUNTER MEDICATION Liqua A     tamoxifen (NOLVADEX) 20 MG tablet Take 1 tablet (20 mg total) by mouth daily. 90 tablet 1   No current facility-administered medications for this visit.    PHYSICAL EXAMINATION: ECOG PERFORMANCE STATUS: 0 - Asymptomatic  Vitals:   04/10/22 1005  BP: 117/79  Pulse: 73  Resp: 18  Temp: 98.4 F (36.9 C)  SpO2: 100%   Wt Readings from Last 3 Encounters:  04/10/22 134 lb 3.2 oz (60.9 kg)  10/02/21 137 lb 1.6 oz (62.2 kg)  07/31/21 135 lb (61.2 kg)     GENERAL:alert, no distress and comfortable SKIN: skin color, texture, turgor are normal, no rashes or significant lesions EYES: normal, Conjunctiva are pink and non-injected, sclera clear  NECK: supple, thyroid  normal size, non-tender, without nodularity LYMPH:  no palpable lymphadenopathy in the cervical, axillary LUNGS: clear to auscultation and percussion with normal breathing effort HEART: regular rate & rhythm and no murmurs and no lower extremity edema ABDOMEN:abdomen soft, non-tender and normal bowel sounds Musculoskeletal:no cyanosis of digits and no clubbing  NEURO: alert & oriented x 3 with fluent speech, no focal motor/sensory deficits BREAST: No palpable mass, nodules or adenopathy bilaterally. Breast exam benign.   LABORATORY DATA:  I have reviewed the data as listed    Latest Ref Rng & Units 04/10/2022    9:29 AM 01/01/2022   10:02 AM 10/02/2021    9:59 AM  CBC  WBC 4.0 - 10.5 K/uL 2.2   2.2   2.3    Hemoglobin 12.0 - 15.0 g/dL 12.5   11.9   12.0    Hematocrit 36.0 - 46.0 % 36.4   35.5   35.1    Platelets 150 - 400 K/uL 141   127   131          Latest Ref Rng & Units 04/10/2022    9:29 AM 01/01/2022   10:02 AM 10/02/2021    9:59 AM  CMP  Glucose 70 - 99 mg/dL 71   91   77    BUN 8 - 23 mg/dL $Remove'15   16   15    'zJPKGWe$ Creatinine 0.44 - 1.00 mg/dL 1.12   0.87   0.87    Sodium 135 - 145 mmol/L 142   140   142    Potassium 3.5 - 5.1 mmol/L 4.1   4.1   4.3    Chloride 98 - 111 mmol/L 110   109   111    CO2 22 - 32 mmol/L $RemoveB'28   28   24    'KqSihuNc$ Calcium 8.9 - 10.3 mg/dL 8.6   8.3   8.3    Total Protein 6.5 - 8.1 g/dL 6.3   6.3   6.3    Total Bilirubin 0.3 - 1.2 mg/dL 1.2   1.1   1.3    Alkaline Phos 38 - 126 U/L 62   54   65    AST 15 - 41 U/L $Remo'19   18   19    'ylWcF$ ALT 0 - 44 U/L $Remo'10   11   11        'AbYxo$ RADIOGRAPHIC STUDIES: I have personally reviewed the radiological images as listed and agreed with the findings in the report. No results found.    No orders of the defined types were placed in this encounter.  All questions were answered. The patient knows to call the clinic  with any problems, questions or concerns. No barriers to learning was detected. The total time spent in the appointment  was 25 minutes.     Truitt Merle, MD 04/10/2022   I, Wilburn Mylar, am acting as scribe for Truitt Merle, MD.   I have reviewed the above documentation for accuracy and completeness, and I agree with the above.

## 2022-04-18 ENCOUNTER — Telehealth: Payer: Self-pay

## 2022-04-18 NOTE — Telephone Encounter (Signed)
Pt called stating she would like for Cira Rue, NP to give her a call at her earliest convenience.  Pt did not state why she wanted to speak with Lacie about but want Lacie to please call her.  Sent Patient Call message to Cira Rue, NP.

## 2022-04-24 ENCOUNTER — Telehealth: Payer: Self-pay | Admitting: Nurse Practitioner

## 2022-04-24 NOTE — Telephone Encounter (Signed)
I called Jennifer Sellers back. I reviewed her lab work from 04/10/22 and answered her questions. She was given CT contrast instruction sheet and is very concerned. I reviewed Dr. Ernestina Penna last note that does not indicate a concern or need for imaging, no CT orders placed on last visit. This may have been taken off the printer and given to her by mistake. There was no name associated with the instructions. The only imaging mentioned is routine mammogram in 07/2022. I told her to disregard the document. She is relieved and appreciates the call. She knows to call back with further questions or concerns.   Jennifer Rue, NP

## 2022-06-06 ENCOUNTER — Other Ambulatory Visit: Payer: Medicare Other

## 2022-06-08 ENCOUNTER — Telehealth: Payer: Self-pay | Admitting: Family Medicine

## 2022-06-08 DIAGNOSIS — E559 Vitamin D deficiency, unspecified: Secondary | ICD-10-CM

## 2022-06-08 DIAGNOSIS — E7849 Other hyperlipidemia: Secondary | ICD-10-CM

## 2022-06-08 DIAGNOSIS — E039 Hypothyroidism, unspecified: Secondary | ICD-10-CM

## 2022-06-08 DIAGNOSIS — D696 Thrombocytopenia, unspecified: Secondary | ICD-10-CM

## 2022-06-08 DIAGNOSIS — M069 Rheumatoid arthritis, unspecified: Secondary | ICD-10-CM

## 2022-06-08 NOTE — Telephone Encounter (Signed)
-----   Message from Ellamae Sia sent at 05/30/2022  3:07 PM EDT ----- Regarding: Lab orders for Monday 7.24.22 Patient is scheduled for CPX labs, please order future labs, Thanks , Karna Christmas

## 2022-06-09 ENCOUNTER — Telehealth: Payer: Self-pay | Admitting: Radiology

## 2022-06-09 ENCOUNTER — Other Ambulatory Visit (INDEPENDENT_AMBULATORY_CARE_PROVIDER_SITE_OTHER): Payer: Medicare Other

## 2022-06-09 DIAGNOSIS — E039 Hypothyroidism, unspecified: Secondary | ICD-10-CM | POA: Diagnosis not present

## 2022-06-09 DIAGNOSIS — D696 Thrombocytopenia, unspecified: Secondary | ICD-10-CM | POA: Diagnosis not present

## 2022-06-09 DIAGNOSIS — E7849 Other hyperlipidemia: Secondary | ICD-10-CM | POA: Diagnosis not present

## 2022-06-09 DIAGNOSIS — E559 Vitamin D deficiency, unspecified: Secondary | ICD-10-CM

## 2022-06-09 LAB — COMPREHENSIVE METABOLIC PANEL
ALT: 9 U/L (ref 0–35)
AST: 17 U/L (ref 0–37)
Albumin: 3.7 g/dL (ref 3.5–5.2)
Alkaline Phosphatase: 52 U/L (ref 39–117)
BUN: 14 mg/dL (ref 6–23)
CO2: 28 mEq/L (ref 19–32)
Calcium: 8.1 mg/dL — ABNORMAL LOW (ref 8.4–10.5)
Chloride: 107 mEq/L (ref 96–112)
Creatinine, Ser: 0.99 mg/dL (ref 0.40–1.20)
GFR: 54.7 mL/min — ABNORMAL LOW (ref >60.00)
Glucose, Bld: 95 mg/dL (ref 70–99)
Potassium: 4 mEq/L (ref 3.5–5.1)
Sodium: 142 mEq/L (ref 135–145)
Total Bilirubin: 1.1 mg/dL (ref 0.2–1.2)
Total Protein: 5.8 g/dL — ABNORMAL LOW (ref 6.0–8.3)

## 2022-06-09 LAB — CBC WITH DIFFERENTIAL/PLATELET
Basophils Absolute: 0 10*3/uL (ref 0.0–0.1)
Basophils Relative: 1.1 % (ref 0.0–3.0)
Eosinophils Absolute: 0 10*3/uL (ref 0.0–0.7)
Eosinophils Relative: 1.2 % (ref 0.0–5.0)
HCT: 32.9 % — ABNORMAL LOW (ref 36.0–46.0)
Hemoglobin: 11.6 g/dL — ABNORMAL LOW (ref 12.0–15.0)
Lymphocytes Relative: 40.8 % (ref 12.0–46.0)
Lymphs Abs: 0.7 10*3/uL (ref 0.7–4.0)
MCHC: 35.1 g/dL (ref 30.0–36.0)
MCV: 92.9 fl (ref 78.0–100.0)
Monocytes Absolute: 0.2 10*3/uL (ref 0.1–1.0)
Monocytes Relative: 10 % (ref 3.0–12.0)
Neutro Abs: 0.8 10*3/uL — ABNORMAL LOW (ref 1.4–7.7)
Neutrophils Relative %: 46.9 % (ref 43.0–77.0)
Platelets: 132 10*3/uL — ABNORMAL LOW (ref 150.0–400.0)
RBC: 3.55 Mil/uL — ABNORMAL LOW (ref 3.87–5.11)
RDW: 13.5 % (ref 11.5–15.5)
WBC: 1.6 10*3/uL — CL (ref 4.0–10.5)

## 2022-06-09 LAB — LIPID PANEL
Cholesterol: 189 mg/dL (ref 0–200)
HDL: 50.7 mg/dL (ref >39.00)
LDL Cholesterol: 119 mg/dL — ABNORMAL HIGH (ref 0–99)
NonHDL: 138.7
Total CHOL/HDL Ratio: 4
Triglycerides: 100 mg/dL (ref 0.0–149.0)
VLDL: 20 mg/dL (ref 0.0–40.0)

## 2022-06-09 LAB — VITAMIN D 25 HYDROXY (VIT D DEFICIENCY, FRACTURES): VITD: 22.07 ng/mL — ABNORMAL LOW (ref 30.00–100.00)

## 2022-06-09 LAB — TSH: TSH: 11.07 u[IU]/mL — ABNORMAL HIGH (ref 0.35–5.50)

## 2022-06-09 NOTE — Telephone Encounter (Signed)
Left message on voicemail for patient to call the office back. 

## 2022-06-09 NOTE — Telephone Encounter (Signed)
Please let pt know that wbc is lower and I checked with oncology- we will re check 2-3 months  We will discuss it further at her appt

## 2022-06-09 NOTE — Telephone Encounter (Signed)
She has a history of chronic leukopenia as well as RA and past breast cancer  Will cc to Dr Burr Medico, her oncologist /hematologist   Pending rest of labs

## 2022-06-09 NOTE — Telephone Encounter (Signed)
[facility] called a critical WBC - 1.6, results given to Dr Glori Bickers

## 2022-06-10 DIAGNOSIS — S63519A Sprain of carpal joint of unspecified wrist, initial encounter: Secondary | ICD-10-CM | POA: Insufficient documentation

## 2022-06-10 DIAGNOSIS — M654 Radial styloid tenosynovitis [de Quervain]: Secondary | ICD-10-CM | POA: Insufficient documentation

## 2022-06-10 DIAGNOSIS — M6530 Trigger finger, unspecified finger: Secondary | ICD-10-CM | POA: Insufficient documentation

## 2022-06-10 DIAGNOSIS — E039 Hypothyroidism, unspecified: Secondary | ICD-10-CM | POA: Insufficient documentation

## 2022-06-10 NOTE — Telephone Encounter (Signed)
Patient called and was returning a call she had received yesterday. Call back number [phone].

## 2022-06-10 NOTE — Telephone Encounter (Signed)
Start Night - Client Nonclinical Telephone Record  [facility] Client [place] Night - Client Client Site [place] Provider Loura Pardon - MD Contact Type Call Who Is Calling Patient / Member / Family / Caregiver Caller Name Avamae Dehaan Phone Number [phone] Call Type Message Only Information Provided Reason for Call Returning a Call from the Office Initial [place] states is returning a call from the office. Additional Comment Office hours provided. Disp. Time Disposition Final User 06/09/2022 5:30:17 PM General Information Provided Yes Cristela Felt Call Closed By: Cristela Felt Transaction Date/Time: 06/09/2022 5:25:47 PM (ET

## 2022-06-10 NOTE — Telephone Encounter (Signed)
Patient notified as instructed by telephone and verbalized understanding. Patient's upcoming appointments confirmed with her.

## 2022-06-11 ENCOUNTER — Ambulatory Visit (INDEPENDENT_AMBULATORY_CARE_PROVIDER_SITE_OTHER): Payer: Medicare Other

## 2022-06-11 VITALS — Wt 134.0 lb

## 2022-06-11 DIAGNOSIS — Z0000 Encounter for general adult medical examination without abnormal findings: Secondary | ICD-10-CM

## 2022-06-11 NOTE — Progress Notes (Signed)
Virtual Visit via Telephone Note  I connected with  [place] on 06/11/22 at 10:30 AM EDT by telephone and verified that I am speaking with the correct person using two identifiers.  Location: Patient: home Provider: [place] Persons participating in the virtual visit: [place]   I discussed the limitations, risks, security and privacy concerns of performing an evaluation and management service by telephone and the availability of in person appointments. The patient expressed understanding and agreed to proceed.  Interactive audio and video telecommunications were attempted between this nurse and patient, however failed, due to patient having technical difficulties OR patient did not have access to video capability.  We continued and completed visit with audio only.  Some vital signs may be absent or patient reported.   Dionisio David, LPN  Subjective:   Jennifer Sellers is a 76 y.o. female who presents for Medicare Annual (Subsequent) preventive examination.  Review of Systems     Cardiac Risk Factors include: advanced age (>28mn, >>38women)     Objective:    There were no vitals filed for this visit. There is no height or weight on file to calculate BMI.     06/11/2022   10:35 AM 05/28/2021    1:41 PM 10/31/2020    9:32 AM 10/05/2020    7:15 AM 09/28/2020   11:05 AM 08/29/2020   10:13 PM 08/29/2020    1:43 PM  Advanced Directives  Does Patient Have a Medical Advance Directive? No Yes Yes Yes No No No  Type of ACorporate treasurerof AHawardenLiving will HEl DaraLiving will      Does patient want to make changes to medical advance directive?    No - Patient declined     Copy of HGilcrestin Chart?  No - copy requested       Would patient like information on creating a medical advance directive? No - Patient declined   No - Patient declined No - Patient declined No - Patient declined      Current Medications (verified) Outpatient Encounter Medications as of 06/11/2022  Medication Sig   Biotin 1 MG CAPS    famotidine (PEPCID) 20 MG tablet Take by mouth.   levothyroxine (SYNTHROID) 50 MCG tablet Take 1 tablet (50 mcg total) by mouth daily before breakfast.   Multiple Vitamins-Minerals (PRESERVISION AREDS 2 PO) Take 2 capsules by mouth daily.    NON FORMULARY 2 drops daily. sustane   tamoxifen (NOLVADEX) 20 MG tablet Take 1 tablet (20 mg total) by mouth daily.   triamcinolone ointment (KENALOG) 0.1 %    [DISCONTINUED] tamoxifen (NOLVADEX) 20 MG tablet Take by mouth.   benzonatate (TESSALON) 200 MG capsule Take by mouth. (Patient not taking: Reported on 06/11/2022)   Cholecalciferol (OPTIMAL-D PO) Take 1 tablet by mouth daily. (Patient not taking: Reported on 06/11/2022)   Cholecalciferol 125 MCG (5000 UT) TABS Take by oral route. (Patient not taking: Reported on 06/11/2022)   doxycycline (VIBRAMYCIN) 100 MG capsule Take 100 mg by mouth 2 (two) times daily. (Patient not taking: Reported on 06/11/2022)   fluorometholone (FML) 0.1 % ophthalmic ointment APPLY A SMALL AMOUNT TO BOTH UPPER EYELIDS TWICE A DAY (Patient not taking: Reported on 06/11/2022)   Naproxen Sodium (ALEVE) 220 MG CAPS as needed. (Patient not taking: Reported on 06/11/2022)   OVER THE COUNTER MEDICATION Optimal longevi D K2 (Patient not taking: Reported on 06/11/2022)   OVER THE COUNTER  MEDICATION Liqua A (Patient not taking: Reported on 06/11/2022)   predniSONE (DELTASONE) 10 MG tablet Take 10 mg by mouth 2 (two) times daily. (Patient not taking: Reported on 06/11/2022)   predniSONE (DELTASONE) 5 MG tablet Take 1-2 tabs po qd prn joint pain/sweling (Patient not taking: Reported on 06/11/2022)   [DISCONTINUED] Multiple Vitamins-Minerals (PRESERVISION AREDS 2) CAPS    No facility-administered encounter medications on file as of 06/11/2022.    Allergies (verified) Misc. sulfonamide containing compounds, Cipro  [ciprofloxacin hcl], Cortisporin [bacitra-neomycin-polymyxin-hc], Prevnar [pneumococcal 13-val conj vacc], Sulfa antibiotics, and Sulfonamide derivatives   History: Past Medical History:  Diagnosis Date   Arthritis    rheumatoid arthritis   Hypothyroidism    Leukopenia    Thyroid disease    TIA (transient ischemic attack) 2011   Past Surgical History:  Procedure Laterality Date   ABDOMINAL HYSTERECTOMY     BREAST LUMPECTOMY WITH RADIOACTIVE SEED AND SENTINEL LYMPH NODE BIOPSY Left 10/05/2020   Procedure: LEFT BREAST LUMPECTOMY WITH RADIOACTIVE SEED AND SENTINEL LYMPH NODE BIOPSY;  Surgeon: Jovita Kussmaul, MD;  Location: [facility];  Service: General;  Laterality: Left;  GENERAL AND PECTORAL BLOCK   CARPAL TUNNEL RELEASE     COLONOSCOPY     COLONOSCOPY WITH PROPOFOL N/A 06/30/2018   Procedure: COLONOSCOPY WITH PROPOFOL;  Surgeon: Manya Silvas, MD;  Location[facility] ENDOSCOPY;  Service: Endoscopy;  Laterality: N/A;   colpo-ureteroscopy     lens surgery     Family History  Problem Relation Age of Onset   Cancer Mother        lymphoma   Cancer Brother        lymphoma   Cancer Daughter        NHL   Social History   Socioeconomic History   Marital status: Married    Spouse name: Not on file   Number of children: 2   Years of education: Not on file   Highest education level: Not on file  Occupational History   Not on file  Tobacco Use   Smoking status: Never   Smokeless tobacco: Never  Vaping Use   Vaping Use: Never used  Substance and Sexual Activity   Alcohol use: Yes    Alcohol/week: 0.0 standard drinks of alcohol    Comment: occasional   Drug use: No   Sexual activity: Yes  Other Topics Concern   Not on file  Social History Narrative   Not on file   Social Determinants of Health   Financial Resource Strain: Low Risk  (06/11/2022)   Overall Financial Resource Strain (CARDIA)    Difficulty of Paying Living Expenses: Not hard at all  Food  Insecurity: No Food Insecurity (06/11/2022)   Hunger Vital Sign    Worried About Running Out of Food in the Last Year: Never true    Ran Out of Food in the Last Year: Never true  Transportation Needs: No Transportation Needs (06/11/2022)   PRAPARE - Hydrologist (Medical): No    Lack of Transportation (Non-Medical): No  Physical Activity: Insufficiently Active (06/11/2022)   Exercise Vital Sign    Days of Exercise per Week: 2 days    Minutes of Exercise per Session: 20 min  Stress: No Stress Concern Present (06/11/2022)   [place]    Feeling of Stress : Only a little  Social Connections: Moderately Isolated (06/11/2022)   Social Connection and Isolation Panel [NHANES]  Frequency of Communication with Friends and Family: More than three times a week    Frequency of Social Gatherings with Friends and Family: More than three times a week    Attends Religious Services: More than 4 times per year    Active Member of [employer] or Organizations: No    Attends Archivist Meetings: Never    Marital Status: Widowed    Tobacco Counseling Counseling given: Not Answered   Clinical Intake:  Pre-visit preparation completed: Yes  Pain : No/denies pain     Nutritional Risks: None Diabetes: No  How often do you need to have someone help you when you read instructions, pamphlets, or other written materials from your doctor or pharmacy?: 1 - Never  Diabetic?no     Information entered by :: Kirke Shaggy, LPN   Activities of Daily Living    06/11/2022   10:37 AM  In your present state of health, do you have any difficulty performing the following activities:  Hearing? 0  Vision? 0  Difficulty concentrating or making decisions? 0  Walking or climbing stairs? 0  Dressing or bathing? 0  Doing errands, shopping? 0  Preparing Food and eating ? N  Using the Toilet? N  In the past six  months, have you accidently leaked urine? N  Do you have problems with loss of bowel control? N  Managing your Medications? N  Managing your Finances? N  Housekeeping or managing your Housekeeping? N    Patient Care Team: Tower, Wynelle Fanny, MD as PCP - General Lad, Caesar Bookman, MD as Referring Physician (Ophthalmology) Birder Robson, MD as Referring Physician (Ophthalmology) Worthy Keeler, DC as Consulting Physician (Chiropractic Medicine) Mauro Kaufmann, RN as Oncology Nurse Navigator Rockwell Germany, RN as Oncology Nurse Navigator Jovita Kussmaul, MD as Consulting Physician (General Surgery) Truitt Merle, MD as Consulting Physician (Hematology) Kyung Rudd, MD as Consulting Physician (Radiation Oncology) Alla Feeling, NP as Nurse Practitioner (Nurse Practitioner)  Indicate any recent Medical Services you may have received from other than [facility] providers in the past year (date may be approximate).     Assessment:   This is a routine wellness examination for Jennifer Sellers.  Hearing/Vision screen Hearing Screening - Comments:: No aids Vision Screening - Comments:: Readers-  Eye   Dietary issues and exercise activities discussed: Current Exercise Habits: Home exercise routine, Type of exercise: walking, Time (Minutes): 20, Frequency (Times/Week): 2, Weekly Exercise (Minutes/Week): 40, Intensity: Mild   Goals Addressed             This Visit's Progress    DIET - EAT MORE FRUITS AND VEGETABLES         Depression Screen    06/11/2022   10:33 AM 05/28/2021    1:42 PM 05/30/2020    8:33 AM 05/09/2019    9:46 AM 01/28/2018   10:30 AM 04/22/2017    9:56 AM 05/15/2016    9:04 AM  PHQ 2/9 Scores  PHQ - 2 Score 0 0 0 0 0 0 0  PHQ- 9 Score 0 0  0 0      Fall Risk    06/11/2022   10:36 AM 05/28/2021    1:41 PM 05/30/2020    8:33 AM 05/09/2019    9:46 AM 01/28/2018   10:30 AM  Fall Risk   Falls in the past year? 0 0 1 0 No  Number falls in past yr: 0 0 0    Injury with  Fall? 0 0  1    Risk for fall due to : No Fall Risks Medication side effect     Follow up Falls evaluation completed Falls evaluation completed;Falls prevention discussed Falls evaluation completed      FALL RISK PREVENTION PERTAINING TO THE HOME:  Any stairs in or around the home? Yes  If so, are there any without handrails? No  Home free of loose throw rugs in walkways, pet beds, electrical cords, etc? Yes  Adequate lighting in your home to reduce risk of falls? Yes   ASSISTIVE DEVICES UTILIZED TO PREVENT FALLS:  Life alert? No  Use of a cane, walker or w/c? No  Grab bars in the bathroom? Yes  Shower chair or bench in shower? Yes  Elevated toilet seat or a handicapped toilet? Yes    Cognitive Function:    05/28/2021    1:43 PM 05/09/2019    9:54 AM 01/28/2018   10:31 AM 04/22/2017    9:59 AM 05/15/2016    8:52 AM  MMSE - Mini Mental State Exam  Orientation to time '5 5 5 5 5  '$ Orientation to Place '5 5 5 5 5  '$ Registration '3 3 3 3 3  '$ Attention/ Calculation 5 0 0 0 0  Recall '3 3 3 3 3  '$ Language- name 2 objects  0 0 0 0  Language- repeat '1 1 1 1 1  '$ Language- follow 3 step command  0 '3 3 3  '$ Language- read & follow direction  0 0 0 0  Write a sentence  0 0 0 0  Copy design  0 0 0 0  Total score  '17 20 20 20        '$ 06/11/2022   10:38 AM  6CIT Screen  What Year? 0 points  What month? 0 points  What time? 0 points  Count back from 20 0 points  Months in reverse 0 points  Repeat phrase 0 points  Total Score 0 points    Immunizations Immunization History  Administered Date(s) Administered   PFIZER(Purple Top)SARS-COV-2 Vaccination 12/07/2019, 12/28/2019, 10/31/2020   Pneumococcal Conjugate-13 05/15/2015   Td 04/06/2007, 02/02/2018    TDAP status: Up to date  Flu Vaccine status: Declined, Education has been provided regarding the importance of this vaccine but patient still declined. Advised may receive this vaccine at local pharmacy or Health Dept. Aware to provide a  copy of the vaccination record if obtained from local pharmacy or Health Dept. Verbalized acceptance and understanding.  Pneumococcal vaccine status: Due, Education has been provided regarding the importance of this vaccine. Advised may receive this vaccine at local pharmacy or Health Dept. Aware to provide a copy of the vaccination record if obtained from local pharmacy or Health Dept. Verbalized acceptance and understanding.  Covid-19 vaccine status: Completed vaccines  Qualifies for Shingles Vaccine? Yes   Zostavax completed No   Shingrix Completed?: Yes  Screening Tests Health Maintenance  Topic Date Due   Pneumonia Vaccine 67+ Years old (2 - PPSV23 or PCV20) 05/14/2016   COVID-19 Vaccine (4 - Booster for Pfizer series) 12/26/2020   Zoster Vaccines- Shingrix (1 of 2) 06/10/2031 (Originally 02/24/1965)   INFLUENZA VACCINE  06/17/2022   MAMMOGRAM  07/17/2022   TETANUS/TDAP  02/03/2028   DEXA SCAN  Completed   Hepatitis C Screening  Completed   HPV VACCINES  Aged Out   COLONOSCOPY (Pts 45-39yr Insurance coverage will need to be confirmed)  Discontinued    Health Maintenance  Health Maintenance Due  Topic Date  Due   Pneumonia Vaccine 51+ Years old (2 - PPSV23 or PCV20) 05/14/2016   COVID-19 Vaccine (4 - Booster for Pfizer series) 12/26/2020    Colorectal cancer screening: Type of screening: Colonoscopy. Completed 06/30/18. Repeat every 10 years  Mammogram status: No longer required due to 07/17/21.- scheduled already for this year  Bone Density status: Completed 07/17/21. Results reflect: Bone density results: OSTEOPENIA. Repeat every 2 years.  Lung Cancer Screening: (Low Dose CT Chest recommended if Age 44-80 years, 30 pack-year currently smoking OR have quit w/in 15years.) does not qualify.   Additional Screening:  Hepatitis C Screening: does qualify; Completed 11/21/13  Vision Screening: Recommended annual ophthalmology exams for early detection of glaucoma and other  disorders of the eye. Is the patient up to date with their annual eye exam?  Yes  Who is the provider or what is the name of the office in which the patient attends annual eye exams? [place] If pt is not established with a provider, would they like to be referred to a provider to establish care? No .   Dental Screening: Recommended annual dental exams for proper oral hygiene  Community Resource Referral / Chronic Care Management: CRR required this visit?  No   CCM required this visit?  No      Plan:     I have personally reviewed and noted the following in the patient's chart:   Medical and social history Use of alcohol, tobacco or illicit drugs  Current medications and supplements including opioid prescriptions.  Functional ability and status Nutritional status Physical activity Advanced directives List of other physicians Hospitalizations, surgeries, and ER visits in previous 12 months Vitals Screenings to include cognitive, depression, and falls Referrals and appointments  In addition, I have reviewed and discussed with patient certain preventive protocols, quality metrics, and best practice recommendations. A written personalized care plan for preventive services as well as general preventive health recommendations were provided to patient.     Dionisio David, LPN   [id number]   Nurse Notes: none

## 2022-06-11 NOTE — Patient Instructions (Signed)
Jennifer Sellers , Thank you for taking time to come for your Medicare Wellness Visit. I appreciate your ongoing commitment to your health goals. Please review the following plan we discussed and let me know if I can assist you in the future.   Screening recommendations/referrals: Colonoscopy: 06/30/18 Mammogram: 07/17/21, scheduled for this year already Bone Density: 07/17/21 Recommended yearly ophthalmology/optometry visit for glaucoma screening and checkup Recommended yearly dental visit for hygiene and checkup  Vaccinations: Influenza vaccine: n/d Pneumococcal vaccine: 05/15/15 Tdap vaccine: 02/02/18 Shingles vaccine: n/d   Covid-19:12/07/19, 12/28/19, 10/31/20  Advanced directives: no  Conditions/risks identified: none  Next appointment: Follow up in one year for your annual wellness visit 06/15/23 @ 9:45 am by phone   Preventive Care 65 Years and Older, Female Preventive care refers to lifestyle choices and visits with your health care provider that can promote health and wellness. What does preventive care include? A yearly physical exam. This is also called an annual well check. Dental exams once or twice a year. Routine eye exams. Ask your health care provider how often you should have your eyes checked. Personal lifestyle choices, including: Daily care of your teeth and gums. Regular physical activity. Eating a healthy diet. Avoiding tobacco and drug use. Limiting alcohol use. Practicing safe sex. Taking low-dose aspirin every day. Taking vitamin and mineral supplements as recommended by your health care provider. What happens during an annual well check? The services and screenings done by your health care provider during your annual well check will depend on your age, overall health, lifestyle risk factors, and family history of disease. Counseling  Your health care provider may ask you questions about your: Alcohol use. Tobacco use. Drug use. Emotional well-being. Home and  relationship well-being. Sexual activity. Eating habits. History of falls. Memory and ability to understand (cognition). Work and work Statistician. Reproductive health. Screening  You may have the following tests or measurements: Height, weight, and BMI. Blood pressure. Lipid and cholesterol levels. These may be checked every 5 years, or more frequently if you are over 40 years old. Skin check. Lung cancer screening. You may have this screening every year starting at age 75 if you have a 30-pack-year history of smoking and currently smoke or have quit within the past 15 years. Fecal occult blood test (FOBT) of the stool. You may have this test every year starting at age 70. Flexible sigmoidoscopy or colonoscopy. You may have a sigmoidoscopy every 5 years or a colonoscopy every 10 years starting at age 58. Hepatitis C blood test. Hepatitis B blood test. Sexually transmitted disease (STD) testing. Diabetes screening. This is done by checking your blood sugar (glucose) after you have not eaten for a while (fasting). You may have this done every 1-3 years. Bone density scan. This is done to screen for osteoporosis. You may have this done starting at age 74. Mammogram. This may be done every 1-2 years. Talk to your health care provider about how often you should have regular mammograms. Talk with your health care provider about your test results, treatment options, and if necessary, the need for more tests. Vaccines  Your health care provider may recommend certain vaccines, such as: Influenza vaccine. This is recommended every year. Tetanus, diphtheria, and acellular pertussis (Tdap, Td) vaccine. You may need a Td booster every 10 years. Zoster vaccine. You may need this after age 78. Pneumococcal 13-valent conjugate (PCV13) vaccine. One dose is recommended after age 49. Pneumococcal polysaccharide (PPSV23) vaccine. One dose is recommended after age 78.  Talk to your health care provider  about which screenings and vaccines you need and how often you need them. This information is not intended to replace advice given to you by your health care provider. Make sure you discuss any questions you have with your health care provider. Document Released: 11/30/2015 Document Revised: 07/23/2016 Document Reviewed: 09/04/2015 Elsevier Interactive Patient Education  2017 [place] Prevention in the Home Falls can cause injuries. They can happen to people of all ages. There are many things you can do to make your home safe and to help prevent falls. What can I do on the outside of my home? Regularly fix the edges of walkways and driveways and fix any cracks. Remove anything that might make you trip as you walk through a door, such as a raised step or threshold. Trim any bushes or trees on the path to your home. Use bright outdoor lighting. Clear any walking paths of anything that might make someone trip, such as rocks or tools. Regularly check to see if handrails are loose or broken. Make sure that both sides of any steps have handrails. Any raised decks and porches should have guardrails on the edges. Have any leaves, snow, or ice cleared regularly. Use sand or salt on walking paths during winter. Clean up any spills in your garage right away. This includes oil or grease spills. What can I do in the bathroom? Use night lights. Install grab bars by the toilet and in the tub and shower. Do not use towel bars as grab bars. Use non-skid mats or decals in the tub or shower. If you need to sit down in the shower, use a plastic, non-slip stool. Keep the floor dry. Clean up any water that spills on the floor as soon as it happens. Remove soap buildup in the tub or shower regularly. Attach bath mats securely with double-sided non-slip rug tape. Do not have throw rugs and other things on the floor that can make you trip. What can I do in the bedroom? Use night lights. Make sure  that you have a light by your bed that is easy to reach. Do not use any sheets or blankets that are too big for your bed. They should not hang down onto the floor. Have a firm chair that has side arms. You can use this for support while you get dressed. Do not have throw rugs and other things on the floor that can make you trip. What can I do in the kitchen? Clean up any spills right away. Avoid walking on wet floors. Keep items that you use a lot in easy-to-reach places. If you need to reach something above you, use a strong step stool that has a grab bar. Keep electrical cords out of the way. Do not use floor polish or wax that makes floors slippery. If you must use wax, use non-skid floor wax. Do not have throw rugs and other things on the floor that can make you trip. What can I do with my stairs? Do not leave any items on the stairs. Make sure that there are handrails on both sides of the stairs and use them. Fix handrails that are broken or loose. Make sure that handrails are as [place] as the stairways. Check any carpeting to make sure that it is firmly attached to the stairs. Fix any carpet that is loose or worn. Avoid having throw rugs at the top or bottom of the stairs. If you do have throw  rugs, attach them to the floor with carpet tape. Make sure that you have a light switch at the top of the stairs and the bottom of the stairs. If you do not have them, ask someone to add them for you. What else can I do to help prevent falls? Wear shoes that: Do not have high heels. Have rubber bottoms. Are comfortable and fit you well. Are closed at the toe. Do not wear sandals. If you use a stepladder: Make sure that it is fully opened. Do not climb a closed stepladder. Make sure that both sides of the stepladder are locked into place. Ask someone to hold it for you, if possible. Clearly mark and make sure that you can see: Any grab bars or handrails. First and last steps. Where the edge of  each step is. Use tools that help you move around (mobility aids) if they are needed. These include: Canes. Walkers. Scooters. Crutches. Turn on the lights when you go into a dark area. Replace any light bulbs as soon as they burn out. Set up your furniture so you have a clear path. Avoid moving your furniture around. If any of your floors are uneven, fix them. If there are any pets around you, be aware of where they are. Review your medicines with your doctor. Some medicines can make you feel dizzy. This can increase your chance of falling. Ask your doctor what other things that you can do to help prevent falls. This information is not intended to replace advice given to you by your health care provider. Make sure you discuss any questions you have with your health care provider. Document Released: 08/30/2009 Document Revised: 04/10/2016 Document Reviewed: 12/08/2014 Elsevier Interactive Patient Education  2017 [employer].

## 2022-06-13 ENCOUNTER — Encounter: Payer: Self-pay | Admitting: Family Medicine

## 2022-06-13 ENCOUNTER — Ambulatory Visit (INDEPENDENT_AMBULATORY_CARE_PROVIDER_SITE_OTHER): Payer: Medicare Other | Admitting: Family Medicine

## 2022-06-13 VITALS — BP 102/60 | HR 60 | Ht 66.5 in | Wt 132.2 lb

## 2022-06-13 DIAGNOSIS — M069 Rheumatoid arthritis, unspecified: Secondary | ICD-10-CM | POA: Diagnosis not present

## 2022-06-13 DIAGNOSIS — E559 Vitamin D deficiency, unspecified: Secondary | ICD-10-CM

## 2022-06-13 DIAGNOSIS — E039 Hypothyroidism, unspecified: Secondary | ICD-10-CM

## 2022-06-13 DIAGNOSIS — D72819 Decreased white blood cell count, unspecified: Secondary | ICD-10-CM

## 2022-06-13 DIAGNOSIS — Z0000 Encounter for general adult medical examination without abnormal findings: Secondary | ICD-10-CM

## 2022-06-13 DIAGNOSIS — D696 Thrombocytopenia, unspecified: Secondary | ICD-10-CM

## 2022-06-13 DIAGNOSIS — M8589 Other specified disorders of bone density and structure, multiple sites: Secondary | ICD-10-CM

## 2022-06-13 DIAGNOSIS — E7849 Other hyperlipidemia: Secondary | ICD-10-CM

## 2022-06-13 MED ORDER — LEVOTHYROXINE SODIUM 75 MCG PO TABS: 75.0000 ug | ORAL_TABLET | Freq: Every day | ORAL | 3 refills | Status: DC

## 2022-06-13 NOTE — Patient Instructions (Addendum)
Give yourself permission to rest and grieve  Also socialize -this helps keep your brain sharp   We will go up on your levothyroxine to 75 mcg   This may help energy and mental fogginess Schedule non fasting labs in 6 weeks  Get vitamin D3 over the counter and take 2000 iu once daily   If you want to see a rheumatologist in the future, please let us know   Take care of yourself

## 2022-06-13 NOTE — Progress Notes (Unsigned)
Subjective:    Patient ID: Jennifer Sellers, female    DOB: 1946/08/01, 76 y.o.   MRN: [id number]  HPI Here for health maintenance exam and to review chronic medical problems   Wt Readings from Last 3 Encounters:  06/13/22 132 lb 3.2 oz (60 kg)  06/11/22 134 lb (60.8 kg)  04/10/22 134 lb 3.2 oz (60.9 kg)   21.02 kg/m  She feels off mood wise Her husband died this year unexpectedly - 12-14-22  Self care is fair   Is exhausted as well   Has gone for grief counseling initially  Is talking to hospice   A lot of support around her Is alone a lot - has a lot to do   Has to take things slower and still foggy      Immunization History  Administered Date(s) Administered   PFIZER(Purple Top)SARS-COV-2 Vaccination 12/07/2019, 12/28/2019, 10/31/2020   Pneumococcal Conjugate-13 05/15/2015   Td 04/06/2007, 02/02/2018    Health Maintenance Due  Topic Date Due   Pneumonia Vaccine 58+ Years old (2 - PPSV23 or PCV20) 05/14/2016   COVID-19 Vaccine (4 - Booster for Pfizer series) 12/26/2020   Was allergic to prevnar - will not get the pna 23   Mammogram 06/2021 - has this scheduled at [facility] breast exam :  no new lumps  Personal h/o breast cancer  Takes tamoxifen   Colonoscopy 06/2018   Dexa  06/2021 stable osteopenia  Falls: none Fractures:none Supplements  D level is low at 22  Exercise :mowing   BP Readings from Last 3 Encounters:  06/13/22 102/60  04/10/22 117/79  10/02/21 128/90   Pulse Readings from Last 3 Encounters:  06/13/22 60  04/10/22 73  10/02/21 73      Hypothyroidism  Pt has no clinical changes No change in energy level/ hair or skin/ edema and no tremor Lab Results  Component Value Date   TSH 11.07 (H) 06/09/2022    Levothyroxine 50 mcg daily    H/o RA No tx  Lab Results  Component Value Date   WBC 1.6 Repeated and verified X2. (LL) 06/09/2022   HGB 11.6 (L) 06/09/2022   HCT 32.9 (L) 06/09/2022   MCV 92.9 06/09/2022   PLT 132.0  (L) 06/09/2022    Hyperlipidemia Lab Results  Component Value Date   CHOL 189 06/09/2022   CHOL 183 05/28/2021   CHOL 205 (H) 05/23/2020   Lab Results  Component Value Date   HDL 50.70 06/09/2022   HDL 57.30 05/28/2021   HDL 60.90 05/23/2020   Lab Results  Component Value Date   LDLCALC 119 (H) 06/09/2022   LDLCALC 115 (H) 05/28/2021   LDLCALC 131 (H) 05/23/2020   Lab Results  Component Value Date   TRIG 100.0 06/09/2022   TRIG 58.0 05/28/2021   TRIG 65.0 05/23/2020   Lab Results  Component Value Date   CHOLHDL 4 06/09/2022   CHOLHDL 3 05/28/2021   CHOLHDL 3 05/23/2020   Lab Results  Component Value Date   LDLDIRECT 163.4 11/11/2013   LDLDIRECT 152.5 08/14/2011   LDLDIRECT 145.0 04/27/2007   Favorable lipo science profile in the past  No big changes No exercise but does mow   Lab Results  Component Value Date   CREATININE 0.99 06/09/2022   BUN 14 06/09/2022   NA 142 06/09/2022   K 4.0 06/09/2022   CL 107 06/09/2022   CO2 28 06/09/2022   Lab Results  Component Value Date   ALT  9 06/09/2022   AST 17 06/09/2022   ALKPHOS 52 06/09/2022   BILITOT 1.1 06/09/2022    Patient Active Problem List   Diagnosis Date Noted   Carpal joint sprain 06/10/2022   Radial styloid tenosynovitis 06/10/2022   Acquired trigger finger 06/10/2022   Malignant neoplasm of upper-outer quadrant of left breast in female, estrogen receptor positive ([place]) 08/24/2020   Estrogen deficiency 05/24/2019   Hx of adenomatous colonic polyps 03/11/2018   Vitamin D deficiency 08/03/2015   Routine general medical examination at a health care facility 05/15/2015   Dyspepsia 08/23/2014   Abnormal ankle brachial index 08/23/2014   Rheumatoid arthritis ([place]) 06/19/2014   Thrombocytopenia ([place]) 06/19/2014   Encounter for Medicare annual wellness exam 11/11/2013   [place]-term use of Plaquenil 10/17/2013   Hyperbilirubinemia 08/19/2013   Hypothyroid 09/21/2012   NEOPLASMS UNSPEC NATURE BONE  SOFT TISSUE&SKIN 10/23/2010   GOUTY TOPHI 10/23/2010   OTHER SYMPTOMS REFERABLE JOINT SITE UNSPECIFIED 10/23/2010   CARPAL TUNNEL SYNDROME, HX OF 04/05/2009   Leukocytopenia 05/02/2008   DRY MOUTH 04/28/2008   INSOMNIA, TRANSIENT 04/27/2007   Osteopenia 04/27/2007   Hyperlipidemia 04/23/2007   HIATAL HERNIA 04/23/2007   POSTMENOPAUSAL STATUS 04/23/2007   RHEUMATOID FACTOR, POSITIVE 04/23/2007   Past Medical History:  Diagnosis Date   Arthritis    rheumatoid arthritis   Hypothyroidism    Leukopenia    Thyroid disease    TIA (transient ischemic attack) 2011   Past Surgical History:  Procedure Laterality Date   ABDOMINAL HYSTERECTOMY     BREAST LUMPECTOMY WITH RADIOACTIVE SEED AND SENTINEL LYMPH NODE BIOPSY Left 10/05/2020   Procedure: LEFT BREAST LUMPECTOMY WITH RADIOACTIVE SEED AND SENTINEL LYMPH NODE BIOPSY;  Surgeon: Jovita Kussmaul, MD;  Location: [facility];  Service: General;  Laterality: Left;  GENERAL AND PECTORAL BLOCK   CARPAL TUNNEL RELEASE     COLONOSCOPY     COLONOSCOPY WITH PROPOFOL N/A 06/30/2018   Procedure: COLONOSCOPY WITH PROPOFOL;  Surgeon: Manya Silvas, MD;  Location: [facility] ENDOSCOPY;  Service: Endoscopy;  Laterality: N/A;   colpo-ureteroscopy     lens surgery     Social History   Tobacco Use   Smoking status: Never   Smokeless tobacco: Never  Vaping Use   Vaping Use: Never used  Substance Use Topics   Alcohol use: Yes    Alcohol/week: 0.0 standard drinks of alcohol    Comment: occasional   Drug use: No   Family History  Problem Relation Age of Onset   Cancer Mother        lymphoma   Cancer Brother        lymphoma   Cancer Daughter        NHL   Allergies  Allergen Reactions   Misc. Sulfonamide Containing Compounds Rash    REACTION: reaction  not known REACTION: reaction not known   Cipro [Ciprofloxacin Hcl] Other (See Comments)    Unknown    Cortisporin [Bacitra-Neomycin-Polymyxin-Hc]     Burning / swelling of eyes  with eye drops   Prevnar [Pneumococcal 13-Val Conj Vacc] Other (See Comments)    Severe local reaction    Sulfa Antibiotics    Sulfonamide Derivatives     REACTION: reaction not known   Current Outpatient Medications on File Prior to Visit  Medication Sig Dispense Refill   Biotin 1 MG CAPS      Cholecalciferol (OPTIMAL-D PO) Take 1 tablet by mouth daily.     Cholecalciferol 125 MCG (5000 UT) TABS  fluorometholone (FML) 0.1 % ophthalmic ointment      Multiple Vitamins-Minerals (PRESERVISION AREDS 2 PO) Take 2 capsules by mouth daily.      NON FORMULARY 2 drops daily. sustane     OVER THE COUNTER MEDICATION Optimal longevi D K2     OVER THE COUNTER MEDICATION Liqua A     tamoxifen (NOLVADEX) 20 MG tablet Take 1 tablet (20 mg total) by mouth daily. 90 tablet 1   triamcinolone ointment (KENALOG) 0.1 %      No current facility-administered medications on file prior to visit.     Review of Systems  Constitutional:  Positive for fatigue. Negative for activity change, appetite change, fever and unexpected weight change.  HENT:  Negative for congestion, ear pain, rhinorrhea, sinus pressure and sore throat.   Eyes:  Negative for pain, redness and visual disturbance.  Respiratory:  Negative for cough, shortness of breath and wheezing.   Cardiovascular:  Negative for chest pain and palpitations.  Gastrointestinal:  Negative for abdominal pain, blood in stool, constipation and diarrhea.  Endocrine: Negative for polydipsia and polyuria.  Genitourinary:  Negative for dysuria, frequency and urgency.  Musculoskeletal:  Positive for arthralgias. Negative for back pain and myalgias.  Skin:  Negative for pallor and rash.  Allergic/Immunologic: Negative for environmental allergies.  Neurological:  Negative for dizziness, syncope and headaches.  Hematological:  Negative for adenopathy. Does not bruise/bleed easily.  Psychiatric/Behavioral:  Positive for dysphoric mood. Negative for decreased  concentration. The patient is not nervous/anxious.        Objective:   Physical Exam Constitutional:      General: She is not in acute distress.    Appearance: Normal appearance. She is well-developed and normal weight. She is not ill-appearing or diaphoretic.  HENT:     Head: Normocephalic and atraumatic.     Right Ear: Tympanic membrane, ear canal and external ear normal.     Left Ear: Tympanic membrane, ear canal and external ear normal.     Nose: Nose normal. No congestion.     Mouth/Throat:     Mouth: Mucous membranes are moist.     Pharynx: Oropharynx is clear. No posterior oropharyngeal erythema.  Eyes:     General: No scleral icterus.    Extraocular Movements: Extraocular movements intact.     Conjunctiva/sclera: Conjunctivae normal.     Pupils: Pupils are equal, round, and reactive to light.  Neck:     Thyroid: No thyromegaly.     Vascular: No carotid bruit or JVD.  Cardiovascular:     Rate and Rhythm: Normal rate and regular rhythm.     Pulses: Normal pulses.     Heart sounds: Normal heart sounds.     No gallop.  Pulmonary:     Effort: Pulmonary effort is normal. No respiratory distress.     Breath sounds: Normal breath sounds. No wheezing.     Comments: Good air exch Chest:     Chest wall: No tenderness.  Abdominal:     General: Bowel sounds are normal. There is no distension or abdominal bruit.     Palpations: Abdomen is soft. There is no mass.     Tenderness: There is no abdominal tenderness.     Hernia: No hernia is present.  Genitourinary:    Comments: Breast exam: No mass, nodules, thickening, tenderness, bulging, retraction, inflamation, nipple discharge or skin changes noted.  No axillary or clavicular LA.     Musculoskeletal:        General:  No tenderness. Normal range of motion.     Cervical back: Normal range of motion and neck supple. No rigidity. No muscular tenderness.     Right lower leg: No edema.     Left lower leg: No edema.     Comments:  Mild kyphosis   Lymphadenopathy:     Cervical: No cervical adenopathy.  Skin:    General: Skin is warm and dry.     Coloration: Skin is not pale.     Findings: No erythema or rash.     Comments: Solar lentigines diffusely   Neurological:     Mental Status: She is alert. Mental status is at baseline.     Cranial Nerves: No cranial nerve deficit.     Motor: No abnormal muscle tone.     Coordination: Coordination normal.     Gait: Gait normal.     Deep Tendon Reflexes: Reflexes are normal and symmetric. Reflexes normal.  Psychiatric:        Attention and Perception: Attention normal.        Mood and Affect: Mood is depressed.        Cognition and Memory: Cognition and memory normal.     Comments: Discussed grief  Tearful at times   Candidly discusses symptoms and stressors             Assessment & Plan:   Problem List Items Addressed This Visit       Endocrine   Hypothyroid    Hypothyroidism  Pt has no clinical changes No change in energy level/ hair or skin/ edema and no tremor Lab Results  Component Value Date   TSH 11.07 (H) 06/09/2022    Plan to increase dose of levothyroxine to 75 mcg daily  Hope this will help fatigue and mental fuzziness Re check in 6 wk      Relevant Medications   levothyroxine (SYNTHROID) 75 MCG tablet     Musculoskeletal and Integument   Osteopenia    dexa 8/22 reviewed  No falls or fractures  D level is low-recommend 2000 iu D3 daily  Encouraged weight bearing exercise       Rheumatoid arthritis (HCC)    Chooses not to treat  Suspect this lowers wbc Her hematologist is aware        Hematopoietic and Hemostatic   Thrombocytopenia (HCC)    Low plt and wbc likely due to RA alone with mild anemia  Her hematologist is aware  Plan to re check labs per hematology         Other   Hyperlipidemia    Disc goals for lipids and reasons to control them Rev last labs with pt Rev low sat fat diet in detail Stable  Past nl  lipo science profile  Last LDL of 119 with HDL over 50       Leukocytopenia    1.9  Likely due to RA  No symptoms  No recentl infections       Routine general medical examination at a health care facility - Primary    Reviewed health habits including diet and exercise and skin cancer prevention Reviewed appropriate screening tests for age  Also reviewed health mt list, fam hx and immunization status , as well as social and family history   See HPI Labs reviewed  Disc grief and pt declines addn counseling No pna vaccine to to past rxn Mammogram due next month in setting of past breast cancer (continues onc f/u) Colonoscopy utd from  2019 dexa utd 06/2021  No falls or fx /enc vit D and wt bearing exercise        Vitamin D deficiency    Low this check Enc her to take 2000 iu D3 daily  Disc imp to bone and overall help

## 2022-06-15 NOTE — Assessment & Plan Note (Signed)
Low plt and wbc likely due to RA alone with mild anemia  Her hematologist is aware  Plan to re check labs per hematology

## 2022-06-15 NOTE — Assessment & Plan Note (Signed)
dexa 8/22 reviewed  No falls or fractures  D level is low-recommend 2000 iu D3 daily  Encouraged weight bearing exercise

## 2022-06-15 NOTE — Assessment & Plan Note (Signed)
1.9  Likely due to RA  No symptoms  No recentl infections

## 2022-06-15 NOTE — Assessment & Plan Note (Signed)
Low this check Enc her to take 2000 iu D3 daily  Disc imp to bone and overall help

## 2022-06-15 NOTE — Assessment & Plan Note (Signed)
Reviewed health habits including diet and exercise and skin cancer prevention Reviewed appropriate screening tests for age  Also reviewed health mt list, fam hx and immunization status , as well as social and family history   See HPI Labs reviewed  Disc grief and pt declines addn counseling No pna vaccine to to past rxn Mammogram due next month in setting of past breast cancer (continues onc f/u) Colonoscopy utd from 2019 dexa utd 06/2021  No falls or fx /enc vit D and wt bearing exercise

## 2022-06-15 NOTE — Assessment & Plan Note (Signed)
Hypothyroidism  Pt has no clinical changes No change in energy level/ hair or skin/ edema and no tremor Lab Results  Component Value Date   TSH 11.07 (H) 06/09/2022    Plan to increase dose of levothyroxine to 75 mcg daily  Hope this will help fatigue and mental fuzziness Re check in 6 wk

## 2022-06-15 NOTE — Assessment & Plan Note (Signed)
Chooses not to treat  Suspect this lowers wbc Her hematologist is aware

## 2022-06-15 NOTE — Assessment & Plan Note (Signed)
Disc goals for lipids and reasons to control them Rev last labs with pt Rev low sat fat diet in detail Stable  Past nl lipo science profile  Last LDL of 119 with HDL over 50

## 2022-07-25 ENCOUNTER — Other Ambulatory Visit (INDEPENDENT_AMBULATORY_CARE_PROVIDER_SITE_OTHER): Payer: Medicare Other

## 2022-07-25 ENCOUNTER — Telehealth: Payer: Self-pay | Admitting: Radiology

## 2022-07-25 DIAGNOSIS — E039 Hypothyroidism, unspecified: Secondary | ICD-10-CM | POA: Diagnosis not present

## 2022-07-25 DIAGNOSIS — D72819 Decreased white blood cell count, unspecified: Secondary | ICD-10-CM | POA: Diagnosis not present

## 2022-07-25 LAB — CBC WITH DIFFERENTIAL/PLATELET
Basophils Absolute: 0 10*3/uL (ref 0.0–0.1)
Basophils Relative: 1.3 % (ref 0.0–3.0)
Eosinophils Absolute: 0 10*3/uL (ref 0.0–0.7)
Eosinophils Relative: 0.7 % (ref 0.0–5.0)
HCT: 33.4 % — ABNORMAL LOW (ref 36.0–46.0)
Hemoglobin: 11.5 g/dL — ABNORMAL LOW (ref 12.0–15.0)
Lymphocytes Relative: 68.2 % — ABNORMAL HIGH (ref 12.0–46.0)
Lymphs Abs: 1.1 10*3/uL (ref 0.7–4.0)
MCHC: 34.5 g/dL (ref 30.0–36.0)
MCV: 92.3 fl (ref 78.0–100.0)
Monocytes Absolute: 0.1 10*3/uL (ref 0.1–1.0)
Monocytes Relative: 6.5 % (ref 3.0–12.0)
Neutro Abs: 0.4 10*3/uL — ABNORMAL LOW (ref 1.4–7.7)
Neutrophils Relative %: 23.3 % — ABNORMAL LOW (ref 43.0–77.0)
Platelets: 122 10*3/uL — ABNORMAL LOW (ref 150.0–400.0)
RBC: 3.62 Mil/uL — ABNORMAL LOW (ref 3.87–5.11)
RDW: 12.8 % (ref 11.5–15.5)
WBC: 1.6 10*3/uL — CL (ref 4.0–10.5)

## 2022-07-25 LAB — TSH: TSH: 3.64 u[IU]/mL (ref 0.35–5.50)

## 2022-07-25 NOTE — Telephone Encounter (Signed)
[facility] called a critical WBC - 1.6, results given to Dr Glori Bickers

## 2022-07-25 NOTE — Telephone Encounter (Signed)
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

## 2022-07-28 NOTE — Telephone Encounter (Signed)
Left VM requesting pt to call the office back 

## 2022-07-28 NOTE — Telephone Encounter (Signed)
-----   Message from Abner Greenspan, MD sent at 07/27/2022  9:18 AM EDT ----- Your white blood count is low (the same as last time)  I will forward to your oncologist  Your TSH is not on the normal range-please continue current levothyroxine  (please give refills for year if needed)

## 2022-07-29 NOTE — Telephone Encounter (Signed)
Addressed through result notes  

## 2022-08-04 ENCOUNTER — Telehealth: Payer: Self-pay

## 2022-08-04 NOTE — Telephone Encounter (Signed)
-----   Message from Truitt Merle, MD sent at 07/29/2022  8:02 AM EDT ----- My nurse,   Please let pt know I am aware of her worsening neutropenia, please schedule lab and f/u with me in a month, and remind her about neutropenic fever. 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 If her [place] remains to be very low on next lab, I will stop tamoxifen and watch it.   Truitt Merle  07/29/2022  ----- Message ----- From: Abner Greenspan, MD Sent: 07/27/2022   9:18 AM EDT To: Tammi Sou, CMA; Truitt Merle, MD  Your white blood count is low (the same as last time)  I will forward to your oncologist  Your TSH is not on the normal range-please continue current levothyroxine  (please give refills for year if needed)

## 2022-08-04 NOTE — Telephone Encounter (Signed)
This nurse reached out to this patient and made her aware of lab results and MD recommendations.  Patient acknowledged understanding of results.  Patient knows to expect a call from scheduling of lab and office visit appointments. This nurse reviewed neutropenic precautions.  No further questions or concerns at this time.

## 2022-08-18 ENCOUNTER — Telehealth: Payer: Self-pay

## 2022-08-18 ENCOUNTER — Other Ambulatory Visit: Payer: Self-pay

## 2022-08-18 ENCOUNTER — Inpatient Hospital Stay: Payer: Medicare Other | Attending: Hematology

## 2022-08-18 ENCOUNTER — Inpatient Hospital Stay (HOSPITAL_BASED_OUTPATIENT_CLINIC_OR_DEPARTMENT_OTHER): Payer: Medicare Other | Admitting: Hematology

## 2022-08-18 ENCOUNTER — Inpatient Hospital Stay: Payer: Medicare Other

## 2022-08-18 DIAGNOSIS — D61818 Other pancytopenia: Secondary | ICD-10-CM | POA: Diagnosis present

## 2022-08-18 DIAGNOSIS — D649 Anemia, unspecified: Secondary | ICD-10-CM

## 2022-08-18 DIAGNOSIS — Z90710 Acquired absence of both cervix and uterus: Secondary | ICD-10-CM | POA: Insufficient documentation

## 2022-08-18 DIAGNOSIS — M069 Rheumatoid arthritis, unspecified: Secondary | ICD-10-CM | POA: Insufficient documentation

## 2022-08-18 DIAGNOSIS — Z79810 Long term (current) use of selective estrogen receptor modulators (SERMs): Secondary | ICD-10-CM | POA: Insufficient documentation

## 2022-08-18 DIAGNOSIS — C50412 Malignant neoplasm of upper-outer quadrant of left female breast: Secondary | ICD-10-CM

## 2022-08-18 DIAGNOSIS — R7989 Other specified abnormal findings of blood chemistry: Secondary | ICD-10-CM | POA: Diagnosis not present

## 2022-08-18 DIAGNOSIS — M85852 Other specified disorders of bone density and structure, left thigh: Secondary | ICD-10-CM | POA: Insufficient documentation

## 2022-08-18 DIAGNOSIS — Z170 Estrogen receptor positive status [ER+]: Secondary | ICD-10-CM | POA: Diagnosis not present

## 2022-08-18 DIAGNOSIS — R161 Splenomegaly, not elsewhere classified: Secondary | ICD-10-CM | POA: Diagnosis not present

## 2022-08-18 DIAGNOSIS — Z853 Personal history of malignant neoplasm of breast: Secondary | ICD-10-CM | POA: Insufficient documentation

## 2022-08-18 LAB — CBC WITH DIFFERENTIAL (CANCER CENTER ONLY)
Abs Immature Granulocytes: 0 10*3/uL (ref 0.00–0.07)
Basophils Absolute: 0 10*3/uL (ref 0.0–0.1)
Basophils Relative: 1 %
Eosinophils Absolute: 0 10*3/uL (ref 0.0–0.5)
Eosinophils Relative: 2 %
HCT: 34.7 % — ABNORMAL LOW (ref 36.0–46.0)
Hemoglobin: 11.8 g/dL — ABNORMAL LOW (ref 12.0–15.0)
Immature Granulocytes: 0 %
Lymphocytes Relative: 65 %
Lymphs Abs: 1.1 10*3/uL (ref 0.7–4.0)
MCH: 31.6 pg (ref 26.0–34.0)
MCHC: 34 g/dL (ref 30.0–36.0)
MCV: 92.8 fL (ref 80.0–100.0)
Monocytes Absolute: 0.1 10*3/uL (ref 0.1–1.0)
Monocytes Relative: 7 %
Neutro Abs: 0.4 10*3/uL — CL (ref 1.7–7.7)
Neutrophils Relative %: 25 %
Platelet Count: 114 10*3/uL — ABNORMAL LOW (ref 150–400)
RBC: 3.74 MIL/uL — ABNORMAL LOW (ref 3.87–5.11)
RDW: 13.2 % (ref 11.5–15.5)
WBC Count: 1.6 10*3/uL — ABNORMAL LOW (ref 4.0–10.5)
nRBC: 0 % (ref 0.0–0.2)

## 2022-08-18 LAB — IRON AND IRON BINDING CAPACITY (CC-WL,HP ONLY)
Iron: 69 ug/dL (ref 28–170)
Saturation Ratios: 25 % (ref 10.4–31.8)
TIBC: 280 ug/dL (ref 250–450)
UIBC: 211 ug/dL (ref 148–442)

## 2022-08-18 LAB — CMP (CANCER CENTER ONLY)
ALT: 9 U/L (ref 0–44)
AST: 21 U/L (ref 15–41)
Albumin: 3.5 g/dL (ref 3.5–5.0)
Alkaline Phosphatase: 61 U/L (ref 38–126)
Anion gap: 4 — ABNORMAL LOW (ref 5–15)
BUN: 14 mg/dL (ref 8–23)
CO2: 29 mmol/L (ref 22–32)
Calcium: 8.2 mg/dL — ABNORMAL LOW (ref 8.9–10.3)
Chloride: 110 mmol/L (ref 98–111)
Creatinine: 0.92 mg/dL (ref 0.44–1.00)
GFR, Estimated: >60 mL/min (ref >60)
Glucose, Bld: 94 mg/dL (ref 70–99)
Potassium: 4.1 mmol/L (ref 3.5–5.1)
Sodium: 143 mmol/L (ref 135–145)
Total Bilirubin: 1.1 mg/dL (ref 0.3–1.2)
Total Protein: 5.7 g/dL — ABNORMAL LOW (ref 6.5–8.1)

## 2022-08-18 LAB — HEPATITIS PANEL, ACUTE
HCV Ab: NONREACTIVE
Hep A IgM: NONREACTIVE
Hep B C IgM: NONREACTIVE
Hepatitis B Surface Ag: NONREACTIVE

## 2022-08-18 LAB — RETICULOCYTES
Immature Retic Fract: 10 % (ref 2.3–15.9)
RBC.: 3.89 MIL/uL (ref 3.87–5.11)
Retic Count, Absolute: 50.2 10*3/uL (ref 19.0–186.0)
Retic Ct Pct: 1.3 % (ref 0.4–3.1)

## 2022-08-18 LAB — TECHNOLOGIST SMEAR REVIEW

## 2022-08-18 LAB — HIV ANTIBODY (ROUTINE TESTING W REFLEX): HIV Screen 4th Generation wRfx: NONREACTIVE

## 2022-08-18 LAB — FERRITIN: Ferritin: 58 ng/mL (ref 11–307)

## 2022-08-18 LAB — VITAMIN B12: Vitamin B-12: 411 pg/mL (ref 180–914)

## 2022-08-18 NOTE — Progress Notes (Signed)
Lab called and reported a critical [place] lab value of 0.4 today.  Notified Dr. Burr Medico of the pt's critical lab.

## 2022-08-18 NOTE — Progress Notes (Signed)
[place]   [phone] [phone]   Clinic Follow up Note   Patient Care Team: Tower, Wynelle Fanny, MD as PCP - General Lad, Caesar Bookman, MD as Referring Physician (Ophthalmology) Birder Robson, MD as Referring Physician (Ophthalmology) Worthy Keeler, DC as Consulting Physician (Chiropractic Medicine) Mauro Kaufmann, RN as Oncology Nurse Navigator Rockwell Germany, RN as Oncology Nurse Navigator Jovita Kussmaul, MD as Consulting Physician (General Surgery) Truitt Merle, MD as Consulting Physician (Hematology) Kyung Rudd, MD as Consulting Physician (Radiation Oncology) Alla Feeling, NP as Nurse Practitioner (Nurse Practitioner)  Date of Service:  08/18/2022  CHIEF COMPLAINT: leukopenia, f/u of left breast cancer  CURRENT THERAPY:  Surveillance  ASSESSMENT & PLAN:  Jennifer Sellers is a 76 y.o. female with   1. Chronic Leukopenia, worsening pancytopenia  -she has had chronic leukopenia since 2012 and likely related to her RA, WBC previously stable above 2.2 and ANC above 1K -her blood counts have been dropping lately-- WBC 1.6 since 06/09/22, ANC down to 0.4 since 07/25/22, and platelets down to 114k today (08/18/22). -I reviewed her lab results with them today. I reviewed the different types of blood cells at their roles in the body. I explained the impact of low counts, including higher risk for infection. I discussed some of the causes for the low counts, including medications such as tamoxifen, nutritional cause especially B12 or folate deficiency, chronic liver disease, virus infection, MDS,  multiple myeloma, or recurrence of breast cancer in the bone. While this low of counts is unusual from tamoxifen, I also do not have high suspicion for cancer recurrence in her bones.  -she was previously seen by GI at [facility] in 2015 for elevated bilirubin. No elevated LFT's today (08/18/22), she was previously tested negative for hepatitis B and C. -we will  obtain additional labs today given her persistently low counts to rule out other possible causes. Depending on her counts today, I will likely recommend she proceed with bone marrow biopsy if her pancytopenia does not improve with holding tamoxifen.  Will discuss further in 3 weeks after next labs.  2. Malignant neoplasm of upper-outer quadrant of left breast, invasive lobular carcinoma, stage IA, pT2N0M0, ER+/PR+/HER2-, Grade 1, RS 12 -diagnosed in 08/2020. 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 Oncotype RS 12, low risk. S/p adjuvant radiation. -she began Tamoxifen 12/2020, tolerating well overall. -most recent mammogram 07/22/22 was benign.   3. Osteopenia -DEXA in 06/2019 showed osteopenia in left hip with T-score -1.6. Repeat on 07/17/21 was stable. -we previously discussed that tamoxifen can strengthen her bones.   PLAN: -lab today -Abdominal ultrasound to evaluate liver and spleen -Hold tamoxifen -lab in 3 weeks with phone visit 1-2 days after.  Her pancytopenia does not improve in the next 3 weeks, will proceed to bone marrow biopsy.   No problem-specific Assessment & Plan notes found for this encounter.   SUMMARY OF ONCOLOGIC HISTORY: Oncology History Overview Note  Cancer Staging Malignant neoplasm of upper-outer quadrant of left breast in female, estrogen receptor positive (Jennifer Sellers) Staging form: Breast, AJCC 8th Edition - Clinical stage from 08/29/2020: Stage IA (cT1c, cN0, cM0, G1, ER+, PR+, HER2-) - Unsigned - Pathologic stage from 10/31/2020: Stage IA (pT2, pN0(sn), cM0, G1, ER+, PR+, HER2-, Oncotype DX score: 12) - Unsigned    Malignant neoplasm of upper-outer quadrant of left breast in female, estrogen receptor positive (Jennifer Sellers)  08/02/2020 Mammogram   IMPRESSION The 1cm (0.9x1.2x0.9cm on US)asymmetry in the left breast at 1:00 position, 4 cmfn is indeterminate   08/22/2020 Initial Biopsy    Diagnosis 08/22/20 Breast, left, needle core biopsy, 1 o'clock, 4cmfn -INVASIVE MAMMARY CARCINOMA -SEE COMMENT       -Grade 1 or 3  E-cadherin is NEGATIVE supporting lobular origin   08/22/2020 Receptors her2   ER - 95%  PR - 25%  Ki67 - 10% HER2 Negative on [facility]   08/24/2020 Initial Diagnosis   Malignant neoplasm of upper-outer quadrant of left breast in female, estrogen receptor positive (Jennifer Sellers)   08/29/2020 Breast MRI   IMPRESSION: 1. Enhancing mass in the upper slightly outer left breast measuring approximately 2.4 x 1.6 x 1.8 cm, consistent with the patient's known malignancy. 2. No MRI evidence of malignancy in the right breast. 3. No suspicious lymphadenopathy.   10/05/2020 Surgery   LEFT BREAST LUMPECTOMY WITH RADIOACTIVE SEED AND SENTINEL LYMPH NODE BIOPSY by Dr Marlou Starks    10/05/2020 Pathology Results   FINAL MICROSCOPIC DIAGNOSIS:   A. LYMPH NODE, LEFT AXILLARY #1, SENTINEL, EXCISION:  -  No carcinoma identified in one lymph node (0/1)  -  See comment   B. LYMPH NODE, LEFT AXILLARY, SENTINEL, EXCISION:  -  No carcinoma identified in one lymph node (0/1)  -  See comment   C. LYMPH NODE, LEFT AXILLARY #2, SENTINEL, EXCISION:  -  No carcinoma identified in one lymph node (0/1)  -  See comment   D. BREAST, LEFT, LUMPECTOMY:  -  Invasive lobular carcinoma, Nottingham grade 1 of 3, 2.3 cm  -  Margins uninvolved by carcinoma (<0.1 cm; superior margin)  -  Previous biopsy site changes present  -  See oncology table and comment below    10/05/2020 Oncotype testing   Oncoytpe  Recurrence Score 12 with distant recurrence risk at 9 years with AI or Tamoxifen at 3%  There is less than 1% benefit of chemotherapy.    11/19/2020 - 12/14/2020 Radiation Therapy   Adjuvant Radiation with Dr Lisbeth Renshaw    12/2020 -  Anti-estrogen oral therapy   Tamoxifen 64m daily starting 12/2020    03/13/2021 Survivorship   SCP delivered by LCira Rue NP      INTERVAL HISTORY:  Jennifer Sellers is here for a follow up of chronic leukopenia. She was last seen by me on 04/10/22. She presents to the clinic accompanied by her daughter. She reports she is doing well overall, no new concerns. They tell me she has been dealing with her husband being sick.   All other systems were reviewed with the patient and are negative.  MEDICAL HISTORY:  Past Medical History:  Diagnosis Date   Arthritis    rheumatoid arthritis   Hypothyroidism    Leukopenia    Thyroid disease    TIA (transient ischemic attack) 2011    SURGICAL HISTORY: Past Surgical History:  Procedure Laterality Date   ABDOMINAL HYSTERECTOMY     BREAST LUMPECTOMY WITH RADIOACTIVE SEED AND SENTINEL LYMPH NODE BIOPSY Left 10/05/2020   Procedure: LEFT BREAST LUMPECTOMY WITH RADIOACTIVE SEED AND SENTINEL LYMPH NODE BIOPSY;  Surgeon: TJovita Kussmaul MD;  Location[place] [facility]  Service: General;  Laterality: Left;  GENERAL AND PECTORAL BLOCK   CARPAL TUNNEL RELEASE     COLONOSCOPY     COLONOSCOPY WITH PROPOFOL N/A 06/30/2018   Procedure: COLONOSCOPY WITH PROPOFOL;  Surgeon: EManya Silvas MD;  Location: [facility] ENDOSCOPY;  Service: Endoscopy;  Laterality: N/A;   colpo-ureteroscopy     lens surgery      I have reviewed the social history and family history with the patient and they are unchanged from previous note.  ALLERGIES:  is allergic to misc. sulfonamide containing compounds, cipro [ciprofloxacin hcl], cortisporin [bacitra-neomycin-polymyxin-hc], prevnar [pneumococcal 13-val conj vacc], sulfa antibiotics, and sulfonamide derivatives.  MEDICATIONS:  Current Outpatient Medications  Medication Sig Dispense Refill   Biotin 1 MG CAPS      Cholecalciferol (OPTIMAL-D PO) Take 1 tablet by mouth daily.     Cholecalciferol 125 MCG (5000 UT) TABS      fluorometholone (FML) 0.1 % ophthalmic ointment      levothyroxine (SYNTHROID) 75 MCG tablet Take 1 tablet (75 mcg total) by mouth daily. 90 tablet 3    Multiple Vitamins-Minerals (PRESERVISION AREDS 2 PO) Take 2 capsules by mouth daily.      NON FORMULARY 2 drops daily. sustane     OVER THE COUNTER MEDICATION Optimal longevi D K2     OVER THE COUNTER MEDICATION Liqua A     tamoxifen (NOLVADEX) 20 MG tablet Take 1 tablet (20 mg total) by mouth daily. 90 tablet 1   triamcinolone ointment (KENALOG) 0.1 %      No current facility-administered medications for this visit.    PHYSICAL EXAMINATION: ECOG PERFORMANCE STATUS: 2 - Symptomatic, <50% confined to bed  Vitals:   08/18/22 0850  BP: 116/80  Pulse: 65  Resp: 18  Temp: 98.3 F (36.8 C)  SpO2: 100%   Wt Readings from Last 3 Encounters:  08/18/22 132 lb 1.6 oz (59.9 kg)  06/13/22 132 lb 3.2 oz (60 kg)  06/11/22 134 lb (60.8 kg)     GENERAL:alert, no distress and comfortable SKIN: skin color, texture, turgor are normal, no rashes or significant lesions EYES: normal, Conjunctiva are pink and non-injected, sclera clear  NECK: supple, thyroid normal size, non-tender, without nodularity LYMPH:  no palpable lymphadenopathy in the cervical, axillary  LUNGS: clear to auscultation and percussion with normal breathing effort HEART: regular rate & rhythm and no murmurs and no lower extremity edema ABDOMEN:abdomen soft, non-tender and normal bowel sounds Musculoskeletal:no cyanosis of digits and no clubbing  NEURO: alert & oriented x 3 with fluent speech, no focal motor/sensory deficits  LABORATORY DATA:  I have reviewed the data as listed    Latest Ref Rng & Units 08/18/2022    8:27 AM 07/25/2022    7:43 AM 06/09/2022    8:00 AM  CBC  WBC 4.0 - 10.5 K/uL 1.6  1.6 Repeated and verified X2.  1.6 Repeated and verified X2.   Hemoglobin 12.0 - 15.0 g/dL 11.8  11.5  11.6   Hematocrit 36.0 - 46.0 % 34.7  33.4  32.9   Platelets 150 - 400 K/uL 114  122.0  132.0         Latest Ref Rng & Units 08/18/2022    8:27 AM 06/09/2022    8:00 AM 04/10/2022    9:29 AM  CMP  Glucose 70 - 99 mg/dL 94   95  71   BUN 8 - 23 mg/dL _0 Creatinine 0.44 - 1.00 mg/dL 0.92  0.99  1.12   Sodium 135 - 145 mmol/L 143  142  142   Potassium 3.5 - 5.1 mmol/L 4.1  4.0  4.1   Chloride 98 - 111 mmol/L 110  107  110   CO2 22 - 32 mmol/L  _0 Calcium 8.9 - 10.3 mg/dL 8.2  8.1  8.6   Total Protein 6.5 - 8.1 g/dL 5.7  5.8  6.3   Total Bilirubin 0.3 - 1.2 mg/dL 1.1  1.1  1.2   Alkaline Phos 38 - 126 U/L 61  52  62   AST 15 - 41 U/L _1 ALT 0 - 44 U/L _2 RADIOGRAPHIC STUDIES: I have personally reviewed the radiological images as listed and agreed with the findings in the report. No results found.    Orders Placed This Encounter  Procedures   US Abdomen Complete    Standing Status:   Future    Standing Expiration Date:   08/18/2023    Order Specific Question:   Reason for Exam (SYMPTOM  OR DIAGNOSIS REQUIRED)    Answer:   pancytopenia    Order Specific Question:   Preferred imaging location?    Answer:   [facility]   Technologist smear review    Order Specific Question:   Clinical information:    Answer:   pancytopenia   Hepatitis panel, acute    Standing Status:   Future    Number of Occurrences:   1    Standing Expiration Date:   08/18/2023   HIV antibody (with reflex)    Standing Status:   Future    Number of Occurrences:   1    Standing Expiration Date:   08/18/2023   Reticulocytes    Standing Status:   Future    Number of Occurrences:   1    Standing Expiration Date:   08/19/2023   Vitamin B12    Standing Status:   Standing    Number of Occurrences:   5    Standing Expiration Date:   08/19/2023   Ferritin    Standing Status:   Standing    Number of Occurrences:   20    Standing Expiration Date:   08/19/2023   Folate RBC    Standing Status:   Future    Number of Occurrences:   1    Standing Expiration Date:   08/19/2023   Methylmalonic acid, serum    Standing Status:   Future    Number of Occurrences:   1    Standing Expiration Date:    08/18/2023   Vitamin B12    Standing Status:   Future    Number of Occurrences:   1    Standing Expiration Date:   08/18/2023   Kappa/lambda light chains    Standing Status:   Future    Number of Occurrences:   1    Standing Expiration Date:   08/18/2023   SPEP with reflex to IFE    Standing Status:   Future    Standing Expiration Date:   08/18/2023   All questions were answered. The patient knows to call the clinic with any problems, questions or concerns. No barriers to learning was detected. The total time spent in the appointment was 40 minutes.     Truitt Merle, MD 08/18/2022   I, Wilburn Mylar, am acting as scribe for Truitt Merle, MD.   I have reviewed the above documentation for accuracy and completeness, and I agree with the above.

## 2022-08-18 NOTE — Telephone Encounter (Signed)
Called pt to notify of abdominal US scheduled for 10/9 at 915am, pt aware to be NPO for 8hrs prior to appt, no further questions at this time.

## 2022-08-19 ENCOUNTER — Telehealth: Payer: Self-pay | Admitting: Hematology

## 2022-08-19 LAB — KAPPA/LAMBDA LIGHT CHAINS
Kappa free light chain: 33.7 mg/L — ABNORMAL HIGH (ref 3.3–19.4)
Kappa, lambda light chain ratio: 1.27 (ref 0.26–1.65)
Lambda free light chains: 26.6 mg/L — ABNORMAL HIGH (ref 5.7–26.3)

## 2022-08-19 LAB — FOLATE RBC
Folate, Hemolysate: 375 ng/mL
Folate, RBC: 1008 ng/mL (ref >498)
Hematocrit: 37.2 % (ref 34.0–46.6)

## 2022-08-19 NOTE — Telephone Encounter (Signed)
Left patient a voicemail regarding 10/24 &10/26 appointment

## 2022-08-21 LAB — METHYLMALONIC ACID, SERUM: Methylmalonic Acid, Quantitative: 176 nmol/L (ref 0–378)

## 2022-08-25 ENCOUNTER — Ambulatory Visit (HOSPITAL_COMMUNITY)
Admission: RE | Admit: 2022-08-25 | Discharge: 2022-08-25 | Disposition: A | Discharge status: Home / Self Care | Payer: Medicare Other | Source: Ambulatory Visit | Attending: Hematology | Admitting: Hematology

## 2022-08-25 DIAGNOSIS — D649 Anemia, unspecified: Secondary | ICD-10-CM | POA: Insufficient documentation

## 2022-09-03 ENCOUNTER — Telehealth: Payer: Self-pay

## 2022-09-03 NOTE — Telephone Encounter (Signed)
Spoke with pt regarding Abdominal US results.  Informed pt that her [place] was normal but does show slightly enlarged spleen.  Pt wanted to know if this was the cause of low white blood cell counts or if not does Dr. Burr Medico know what is the cause of her low white blood cell counts.  Informed pt that this RN will notify Dr. Burr Medico of her question since she is better suited in answering her question.  Reminded pt that she has a lab scheduled on 09/09/2022 and a telephone visit with Dr. Burr Medico on 09/11/2022.  Informed pt that Dr. Burr Medico will see if the if her WBCs have improved when the pt has a lab redraw done on 09/09/2022.  Pt verbalized understanding and had no further questions or concerns at this time.  Notified Dr. Burr Medico of the pt's questions and concerns.

## 2022-09-09 ENCOUNTER — Other Ambulatory Visit: Payer: Medicare Other

## 2022-09-09 ENCOUNTER — Telehealth: Payer: Self-pay

## 2022-09-09 ENCOUNTER — Inpatient Hospital Stay: Payer: Medicare Other

## 2022-09-09 ENCOUNTER — Other Ambulatory Visit: Payer: Self-pay

## 2022-09-09 DIAGNOSIS — D61818 Other pancytopenia: Secondary | ICD-10-CM | POA: Diagnosis not present

## 2022-09-09 DIAGNOSIS — C50412 Malignant neoplasm of upper-outer quadrant of left female breast: Secondary | ICD-10-CM

## 2022-09-09 DIAGNOSIS — D649 Anemia, unspecified: Secondary | ICD-10-CM

## 2022-09-09 LAB — CBC WITH DIFFERENTIAL (CANCER CENTER ONLY)
Abs Immature Granulocytes: 0 10*3/uL (ref 0.00–0.07)
Basophils Absolute: 0 10*3/uL (ref 0.0–0.1)
Basophils Relative: 1 %
Eosinophils Absolute: 0 10*3/uL (ref 0.0–0.5)
Eosinophils Relative: 1 %
HCT: 35.5 % — ABNORMAL LOW (ref 36.0–46.0)
Hemoglobin: 12 g/dL (ref 12.0–15.0)
Immature Granulocytes: 0 %
Lymphocytes Relative: 73 %
Lymphs Abs: 1 10*3/uL (ref 0.7–4.0)
MCH: 31.3 pg (ref 26.0–34.0)
MCHC: 33.8 g/dL (ref 30.0–36.0)
MCV: 92.4 fL (ref 80.0–100.0)
Monocytes Absolute: 0.1 10*3/uL (ref 0.1–1.0)
Monocytes Relative: 4 %
Neutro Abs: 0.3 10*3/uL — CL (ref 1.7–7.7)
Neutrophils Relative %: 21 %
Platelet Count: 116 10*3/uL — ABNORMAL LOW (ref 150–400)
RBC: 3.84 MIL/uL — ABNORMAL LOW (ref 3.87–5.11)
RDW: 13.7 % (ref 11.5–15.5)
Smear Review: NORMAL
WBC Count: 1.4 10*3/uL — ABNORMAL LOW (ref 4.0–10.5)
nRBC: 0 % (ref 0.0–0.2)

## 2022-09-09 LAB — CMP (CANCER CENTER ONLY)
ALT: 13 U/L (ref 0–44)
AST: 24 U/L (ref 15–41)
Albumin: 3.6 g/dL (ref 3.5–5.0)
Alkaline Phosphatase: 67 U/L (ref 38–126)
Anion gap: 3 — ABNORMAL LOW (ref 5–15)
BUN: 11 mg/dL (ref 8–23)
CO2: 30 mmol/L (ref 22–32)
Calcium: 8.6 mg/dL — ABNORMAL LOW (ref 8.9–10.3)
Chloride: 108 mmol/L (ref 98–111)
Creatinine: 0.93 mg/dL (ref 0.44–1.00)
GFR, Estimated: >60 mL/min (ref >60)
Glucose, Bld: 88 mg/dL (ref 70–99)
Potassium: 4.1 mmol/L (ref 3.5–5.1)
Sodium: 141 mmol/L (ref 135–145)
Total Bilirubin: 1.1 mg/dL (ref 0.3–1.2)
Total Protein: 6.2 g/dL — ABNORMAL LOW (ref 6.5–8.1)

## 2022-09-09 LAB — VITAMIN B12: Vitamin B-12: 409 pg/mL (ref 180–914)

## 2022-09-09 LAB — FERRITIN: Ferritin: 45 ng/mL (ref 11–307)

## 2022-09-09 NOTE — Telephone Encounter (Signed)
Critical Lab Value reported:  ANC 0.3  Notified Dr. Burr Medico of pt's low ANC.

## 2022-09-10 DIAGNOSIS — D61818 Other pancytopenia: Secondary | ICD-10-CM | POA: Insufficient documentation

## 2022-09-11 ENCOUNTER — Encounter: Payer: Self-pay | Admitting: Hematology

## 2022-09-11 ENCOUNTER — Inpatient Hospital Stay (HOSPITAL_BASED_OUTPATIENT_CLINIC_OR_DEPARTMENT_OTHER): Payer: Medicare Other | Admitting: Hematology

## 2022-09-11 ENCOUNTER — Telehealth: Payer: Medicare Other | Admitting: Hematology

## 2022-09-11 DIAGNOSIS — C50412 Malignant neoplasm of upper-outer quadrant of left female breast: Secondary | ICD-10-CM | POA: Diagnosis not present

## 2022-09-11 DIAGNOSIS — Z170 Estrogen receptor positive status [ER+]: Secondary | ICD-10-CM | POA: Diagnosis not present

## 2022-09-11 DIAGNOSIS — D61818 Other pancytopenia: Secondary | ICD-10-CM

## 2022-09-11 LAB — PROTEIN ELECTROPHORESIS, SERUM, WITH REFLEX
A/G Ratio: 1.3 (ref 0.7–1.7)
Albumin ELP: 3.3 g/dL (ref 2.9–4.4)
Alpha-1-Globulin: 0.2 g/dL (ref 0.0–0.4)
Alpha-2-Globulin: 0.4 g/dL (ref 0.4–1.0)
Beta Globulin: 0.9 g/dL (ref 0.7–1.3)
Gamma Globulin: 1.1 g/dL (ref 0.4–1.8)
Globulin, Total: 2.6 g/dL (ref 2.2–3.9)
Total Protein ELP: 5.9 g/dL — ABNORMAL LOW (ref 6.0–8.5)

## 2022-09-11 NOTE — Progress Notes (Signed)
[place]   [phone] [phone]) [phone]   Clinic Follow up Note   Patient Care Team: Tower, Wynelle Fanny, MD as PCP - General Lad, Caesar Bookman, MD as Referring Physician (Ophthalmology) Birder Robson, MD as Referring Physician (Ophthalmology) Worthy Keeler, DC as Consulting Physician (Chiropractic Medicine) Mauro Kaufmann, RN as Oncology Nurse Navigator Rockwell Germany, RN as Oncology Nurse Navigator Jovita Kussmaul, MD as Consulting Physician (General Surgery) Truitt Merle, MD as Consulting Physician (Hematology) Kyung Rudd, MD as Consulting Physician (Radiation Oncology) Alla Feeling, NP as Nurse Practitioner (Nurse Practitioner)  Date of Service:  09/11/2022  I connected with Jennifer Sellers on 09/11/2022 at  8:20 AM EDT by telephone visit and verified that I am speaking with the correct person using two identifiers.  I discussed the limitations, risks, security and privacy concerns of performing an evaluation and management service by telephone and the availability of in person appointments. I also discussed with the patient that there may be a patient responsible charge related to this service. The patient expressed understanding and agreed to proceed.   Other persons participating in the visit and their role in the encounter:  pt's daughter  Patient's location:  home Provider's location:  my office  CHIEF COMPLAINT: f/u of leukopenia, left breast cancer  CURRENT THERAPY:  Pending  ASSESSMENT & PLAN:  LEEAN Sellers is a 76 y.o. female with   1. Chronic Leukopenia, worsening pancytopenia  -she has had chronic leukopenia since 2012 and likely related to her RA, WBC previously stable above 2.2 and ANC above 1K -her blood counts have been dropping lately-- WBC 1.6 since 06/09/22, ANC down to 0.4 since 07/25/22, and platelets down to 114k on 08/18/22. Light chain levels from 08/18/22 were only slightly elevated, no concern. Hepatitis and HIV both  negative. -we held her tamoxifen at that time. Abdomen US on 08/25/22 showed only mild splenomegaly. I reviewed these results and do not find this concerning. -repeat labs obtained 09/09/22 showed persistently low counts-- WBC 1.4, ANC at 0.3, platelets 116k. SPEP, ferritin, and vit B12 were all WNL. -I reviewed her lab results with them today. I reviewed the impact of low counts, including higher risk for infection. I discussed some of the possible remaining causes for the low counts, including MDS, multiple myeloma, or recurrence of breast cancer in the bone. To rule these out, I recommend proceeding with bone marrow biopsy. She is reluctant but willing to proceed. -we have scheduled BMB for 09/25/22, and I will see her a week later to review the results.   2. Malignant neoplasm of upper-outer quadrant of left breast, invasive lobular carcinoma, stage IA, pT2N0M0, ER+/PR+/HER2-, Grade 1, RS 12 -diagnosed in 08/2020. 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 Oncotype RS 12, low risk. S/p adjuvant radiation. -she began Tamoxifen 12/2020, tolerating well overall. Held since 08/18/22 for work up of leukopenia -most recent mammogram 07/22/22 was benign.   3. Osteopenia -DEXA in 06/2019 showed osteopenia in left hip with T-score -1.6. Repeat on 07/17/21 was stable. -we previously discussed that tamoxifen can strengthen her bones.     PLAN: -continue to hold tamoxifen -bone marrow biopsy 11/9 -f/u on 11/21    No problem-specific Assessment & Plan notes found for this encounter.   SUMMARY OF ONCOLOGIC HISTORY: Oncology History Overview Note  Cancer Staging Malignant neoplasm of upper-outer quadrant of left breast in female, estrogen  receptor positive ([place]) Staging form: Breast, AJCC 8th Edition - Clinical stage from 08/29/2020: Stage IA (cT1c, cN0, cM0, G1, ER+, PR+, HER2-) - Unsigned - Pathologic stage from  10/31/2020: Stage IA (pT2, pN0(sn), cM0, G1, ER+, PR+, HER2-, Oncotype DX score: 12) - Unsigned    Malignant neoplasm of upper-outer quadrant of left breast in female, estrogen receptor positive ([place])  08/02/2020 Mammogram   IMPRESSION The 1cm (0.9x1.2x0.9cm on US)asymmetry in the left breast at 1:00 position, 4 cmfn is indeterminate   08/22/2020 Initial Biopsy   Diagnosis 08/22/20 Breast, left, needle core biopsy, 1 o'clock, 4cmfn -INVASIVE MAMMARY CARCINOMA -SEE COMMENT       -Grade 1 or 3  E-cadherin is NEGATIVE supporting lobular origin   08/22/2020 Receptors her2   ER - 95%  PR - 25%  Ki67 - 10% HER2 Negative on [facility]   08/24/2020 Initial Diagnosis   Malignant neoplasm of upper-outer quadrant of left breast in female, estrogen receptor positive ([place])   08/29/2020 Breast MRI   IMPRESSION: 1. Enhancing mass in the upper slightly outer left breast measuring approximately 2.4 x 1.6 x 1.8 cm, consistent with the patient's known malignancy. 2. No MRI evidence of malignancy in the right breast. 3. No suspicious lymphadenopathy.   10/05/2020 Surgery   LEFT BREAST LUMPECTOMY WITH RADIOACTIVE SEED AND SENTINEL LYMPH NODE BIOPSY by Dr Marlou Starks    10/05/2020 Pathology Results   FINAL MICROSCOPIC DIAGNOSIS:   A. LYMPH NODE, LEFT AXILLARY #1, SENTINEL, EXCISION:  -  No carcinoma identified in one lymph node (0/1)  -  See comment   B. LYMPH NODE, LEFT AXILLARY, SENTINEL, EXCISION:  -  No carcinoma identified in one lymph node (0/1)  -  See comment   C. LYMPH NODE, LEFT AXILLARY #2, SENTINEL, EXCISION:  -  No carcinoma identified in one lymph node (0/1)  -  See comment   D. BREAST, LEFT, LUMPECTOMY:  -  Invasive lobular carcinoma, Nottingham grade 1 of 3, 2.3 cm  -  Margins uninvolved by carcinoma (<0.1 cm; superior margin)  -  Previous biopsy site changes present  -  See oncology table and comment below    10/05/2020 Oncotype testing   Oncoytpe  Recurrence Score 12 with  distant recurrence risk at 9 years with AI or Tamoxifen at 3%  There is less than 1% benefit of chemotherapy.    11/19/2020 - 12/14/2020 Radiation Therapy   Adjuvant Radiation with Dr Lisbeth Renshaw    12/2020 -  Anti-estrogen oral therapy   Tamoxifen $RemoveBe'20mg'DAzsvvgmx$  daily starting 12/2020    03/13/2021 Survivorship   SCP delivered by Cira Rue, NP      INTERVAL HISTORY:  Jennifer Sellers was contacted for a follow up of leukopenia. She was last seen by me on 08/18/22. She reports she is feeling better since stopping tamoxifen, with improved energy and memory. She denies any infection in the last month.   All other systems were reviewed with the patient and are negative.  MEDICAL HISTORY:  Past Medical History:  Diagnosis Date   Arthritis    rheumatoid arthritis   Hypothyroidism    Leukopenia    Thyroid disease    TIA (transient ischemic attack) 2011    SURGICAL HISTORY: Past Surgical History:  Procedure Laterality Date   ABDOMINAL HYSTERECTOMY     BREAST LUMPECTOMY WITH RADIOACTIVE SEED AND SENTINEL LYMPH NODE BIOPSY Left 10/05/2020   Procedure: LEFT BREAST LUMPECTOMY WITH RADIOACTIVE SEED AND SENTINEL LYMPH NODE BIOPSY;  Surgeon: Jovita Kussmaul, MD;  Location: [place];  Service: General;  Laterality: Left;  GENERAL AND PECTORAL BLOCK   CARPAL TUNNEL RELEASE     COLONOSCOPY     COLONOSCOPY WITH PROPOFOL N/A 06/30/2018   Procedure: COLONOSCOPY WITH PROPOFOL;  Surgeon: Manya Silvas, MD;  Location: [facility];  Service: Endoscopy;  Laterality: N/A;   colpo-ureteroscopy     lens surgery      I have reviewed the social history and family history with the patient and they are unchanged from previous note.  ALLERGIES:  is allergic to misc. sulfonamide containing compounds, cipro [ciprofloxacin hcl], cortisporin [bacitra-neomycin-polymyxin-hc], prevnar [pneumococcal 13-val conj vacc], sulfa antibiotics, and sulfonamide derivatives.  MEDICATIONS:  Current Outpatient  Medications  Medication Sig Dispense Refill   Biotin 1 MG CAPS      Cholecalciferol (OPTIMAL-D PO) Take 1 tablet by mouth daily.     Cholecalciferol 125 MCG (5000 UT) TABS      fluorometholone (FML) 0.1 % ophthalmic ointment      levothyroxine (SYNTHROID) 75 MCG tablet Take 1 tablet (75 mcg total) by mouth daily. 90 tablet 3   Multiple Vitamins-Minerals (PRESERVISION AREDS 2 PO) Take 2 capsules by mouth daily.      NON FORMULARY 2 drops daily. sustane     OVER THE COUNTER MEDICATION Optimal longevi D K2     OVER THE COUNTER MEDICATION Liqua A     tamoxifen (NOLVADEX) 20 MG tablet Take 1 tablet (20 mg total) by mouth daily. 90 tablet 1   triamcinolone ointment (KENALOG) 0.1 %      No current facility-administered medications for this visit.    PHYSICAL EXAMINATION: ECOG PERFORMANCE STATUS: 1 - Symptomatic but completely ambulatory  There were no vitals filed for this visit. Wt Readings from Last 3 Encounters:  08/18/22 132 lb 1.6 oz (59.9 kg)  06/13/22 132 lb 3.2 oz (60 kg)  06/11/22 134 lb (60.8 kg)     No vitals taken today, Exam not performed today  LABORATORY DATA:  I have reviewed the data as listed    Latest Ref Rng & Units 09/09/2022    8:10 AM 08/18/2022    9:32 AM 08/18/2022    8:27 AM  CBC  WBC 4.0 - 10.5 K/uL 1.4   1.6   Hemoglobin 12.0 - 15.0 g/dL 12.0   11.8   Hematocrit 36.0 - 46.0 % 35.5  37.2  34.7   Platelets 150 - 400 K/uL 116   114         Latest Ref Rng & Units 09/09/2022    8:10 AM 08/18/2022    8:27 AM 06/09/2022    8:00 AM  CMP  Glucose 70 - 99 mg/dL 88  94  95   BUN 8 - 23 mg/dL $Remove'11  14  14   'CxjnqFr$ Creatinine 0.44 - 1.00 mg/dL 0.93  0.92  0.99   Sodium 135 - 145 mmol/L 141  143  142   Potassium 3.5 - 5.1 mmol/L 4.1  4.1  4.0   Chloride 98 - 111 mmol/L 108  110  107   CO2 22 - 32 mmol/L $RemoveB'30  29  28   'gmDGpDxK$ Calcium 8.9 - 10.3 mg/dL 8.6  8.2  8.1   Total Protein 6.5 - 8.1 g/dL 6.2  5.7  5.8   Total Bilirubin 0.3 - 1.2 mg/dL 1.1  1.1  1.1   Alkaline Phos  38 - 126 U/L 67  61  52   AST 15 - 41 U/L 24  21  17   ALT 0 - 44 U/L $Remo'13  9  9       'wxGuF$ RADIOGRAPHIC STUDIES: I have personally reviewed the radiological images as listed and agreed with the findings in the report. No results found.    Orders Placed This Encounter  Procedures   CT BONE MARROW BIOPSY & ASPIRATION    Standing Status:   Future    Standing Expiration Date:   09/12/2023    Order Specific Question:   Reason for Exam (SYMPTOM  OR DIAGNOSIS REQUIRED)    Answer:   pancytopenia    Order Specific Question:   Preferred location?    Answer:   [facility]   All questions were answered. The patient knows to call the clinic with any problems, questions or concerns. No barriers to learning was detected. The total time spent in the appointment was 25 minutes.     Truitt Merle, MD 09/11/2022   I, Wilburn Mylar, am acting as scribe for Truitt Merle, MD.   I have reviewed the above documentation for accuracy and completeness, and I agree with the above.

## 2022-09-15 ENCOUNTER — Telehealth: Payer: Self-pay | Admitting: Family Medicine

## 2022-09-15 NOTE — Telephone Encounter (Signed)
Patient called in and would like for Dr. Glori Bickers to give her a call. She stated she is going in for cancer testing and wanted to discuss something's with her before going. Thank you!

## 2022-09-15 NOTE — Telephone Encounter (Signed)
Pt said she didn't have any particular question she just would feel better if she could talk to her PCP about what the oncologist found, she is upset and nervous about her possible diagnosis and getting a bone marrow biopsy and just wants to talk to PCP. Pt asked if PCP could call her today she is okay waiting until PCP is done seeing pt's today

## 2022-09-15 NOTE — Telephone Encounter (Signed)
Left VM requesting pt to call the office back 

## 2022-09-15 NOTE — Telephone Encounter (Signed)
Please let me know what her concern is regarding in case there is any info I can get beforehand, thanks

## 2022-09-15 NOTE — Telephone Encounter (Signed)
I spoke to her and discussed her case. Plans to go through with the bone marrow biopsy and I think that is best.  Reassurance given  She has family to talk to as well

## 2022-09-24 ENCOUNTER — Other Ambulatory Visit: Payer: Self-pay | Admitting: Radiology

## 2022-09-24 DIAGNOSIS — D61818 Other pancytopenia: Secondary | ICD-10-CM

## 2022-09-25 ENCOUNTER — Ambulatory Visit (HOSPITAL_COMMUNITY)
Admission: RE | Admit: 2022-09-25 | Discharge: 2022-09-25 | Disposition: A | Discharge status: Home / Self Care | Payer: Medicare Other | Source: Ambulatory Visit | Attending: Hematology | Admitting: Hematology

## 2022-09-25 ENCOUNTER — Other Ambulatory Visit: Payer: Self-pay

## 2022-09-25 ENCOUNTER — Encounter (HOSPITAL_COMMUNITY): Payer: Self-pay

## 2022-09-25 DIAGNOSIS — E039 Hypothyroidism, unspecified: Secondary | ICD-10-CM | POA: Diagnosis not present

## 2022-09-25 DIAGNOSIS — D61818 Other pancytopenia: Secondary | ICD-10-CM | POA: Diagnosis present

## 2022-09-25 DIAGNOSIS — R888 Abnormal findings in other body fluids and substances: Secondary | ICD-10-CM | POA: Insufficient documentation

## 2022-09-25 DIAGNOSIS — Z853 Personal history of malignant neoplasm of breast: Secondary | ICD-10-CM | POA: Diagnosis not present

## 2022-09-25 DIAGNOSIS — D72820 Lymphocytosis (symptomatic): Secondary | ICD-10-CM | POA: Diagnosis not present

## 2022-09-25 DIAGNOSIS — Z8673 Personal history of transient ischemic attack (TIA), and cerebral infarction without residual deficits: Secondary | ICD-10-CM | POA: Diagnosis not present

## 2022-09-25 DIAGNOSIS — Z1379 Encounter for other screening for genetic and chromosomal anomalies: Secondary | ICD-10-CM | POA: Insufficient documentation

## 2022-09-25 DIAGNOSIS — M069 Rheumatoid arthritis, unspecified: Secondary | ICD-10-CM | POA: Diagnosis not present

## 2022-09-25 LAB — CBC WITH DIFFERENTIAL/PLATELET
Abs Immature Granulocytes: 0 10*3/uL (ref 0.00–0.07)
Basophils Absolute: 0 10*3/uL (ref 0.0–0.1)
Basophils Relative: 1 %
Eosinophils Absolute: 0 10*3/uL (ref 0.0–0.5)
Eosinophils Relative: 0 %
HCT: 35.1 % — ABNORMAL LOW (ref 36.0–46.0)
Hemoglobin: 11.5 g/dL — ABNORMAL LOW (ref 12.0–15.0)
Immature Granulocytes: 0 %
Lymphocytes Relative: 67 %
Lymphs Abs: 1.1 10*3/uL (ref 0.7–4.0)
MCH: 30.3 pg (ref 26.0–34.0)
MCHC: 32.8 g/dL (ref 30.0–36.0)
MCV: 92.6 fL (ref 80.0–100.0)
Monocytes Absolute: 0.1 10*3/uL (ref 0.1–1.0)
Monocytes Relative: 7 %
Neutro Abs: 0.4 10*3/uL — CL (ref 1.7–7.7)
Neutrophils Relative %: 25 %
Platelets: 117 10*3/uL — ABNORMAL LOW (ref 150–400)
RBC: 3.79 MIL/uL — ABNORMAL LOW (ref 3.87–5.11)
RDW: 13.6 % (ref 11.5–15.5)
WBC: 1.5 10*3/uL — ABNORMAL LOW (ref 4.0–10.5)
nRBC: 0 % (ref 0.0–0.2)

## 2022-09-25 MED ORDER — FENTANYL CITRATE (PF) 100 MCG/2ML IJ SOLN
INTRAMUSCULAR | Status: AC | PRN
  Administered 2022-09-25: 50 ug via INTRAVENOUS

## 2022-09-25 MED ORDER — SODIUM CHLORIDE 0.9 % IV SOLN: INTRAVENOUS | Status: DC

## 2022-09-25 MED ORDER — FENTANYL CITRATE (PF) 100 MCG/2ML IJ SOLN
INTRAMUSCULAR | Status: AC
  Filled 2022-09-25: qty 4

## 2022-09-25 MED ORDER — MIDAZOLAM HCL 2 MG/2ML IJ SOLN
INTRAMUSCULAR | Status: AC
  Filled 2022-09-25: qty 4

## 2022-09-25 MED ORDER — MIDAZOLAM HCL 2 MG/2ML IJ SOLN
INTRAMUSCULAR | Status: AC | PRN
  Administered 2022-09-25: 1 mg via INTRAVENOUS

## 2022-09-25 NOTE — Procedures (Signed)
Interventional Radiology Procedure Note  Procedure: CT guided aspirate and core biopsy of right iliac bone Complications: None Recommendations: - Bedrest supine x 1 hrs - Hydrocodone PRN  Pain - Follow biopsy results  Signed,  Francenia Chimenti K. Avonell Lenig, MD   

## 2022-09-25 NOTE — Consult Note (Signed)
Chief Complaint: Patient was seen in consultation today for  CT guided bone marrow biopsy  Referring Physician(s): Feng,Yan  Supervising Physician: Jacqulynn Cadet  Patient Status: [facility] - Out-pt  History of Present Illness: Jennifer Sellers is a 76 y.o. female with PMH sig for RA, left breast cancer 2021, hypothyroidism, TIA who presents now with chronic leukopenia and worsening pancytopenia. She is scheduled today for CT guided bone marrow biopsy for further evaluation.   Past Medical History:  Diagnosis Date   Arthritis    rheumatoid arthritis   Hypothyroidism    Leukopenia    Thyroid disease    TIA (transient ischemic attack) 2011    Past Surgical History:  Procedure Laterality Date   ABDOMINAL HYSTERECTOMY     BREAST LUMPECTOMY WITH RADIOACTIVE SEED AND SENTINEL LYMPH NODE BIOPSY Left 10/05/2020   Procedure: LEFT BREAST LUMPECTOMY WITH RADIOACTIVE SEED AND SENTINEL LYMPH NODE BIOPSY;  Surgeon: Jovita Kussmaul, MD;  Location: [place];  Service: General;  Laterality: Left;  GENERAL AND PECTORAL BLOCK   CARPAL TUNNEL RELEASE     COLONOSCOPY     COLONOSCOPY WITH PROPOFOL N/A 06/30/2018   Procedure: COLONOSCOPY WITH PROPOFOL;  Surgeon: Manya Silvas, MD;  Location[facility];  Service: Endoscopy;  Laterality: N/A;   colpo-ureteroscopy     lens surgery      Allergies: Misc. sulfonamide containing compounds, Cipro [ciprofloxacin hcl], Cortisporin [bacitra-neomycin-polymyxin-hc], Prevnar [pneumococcal 13-val conj vacc], Sulfa antibiotics, and Sulfonamide derivatives  Medications: Prior to Admission medications   Medication Sig Start Date End Date Taking? Authorizing Provider  Biotin 1 MG CAPS  05/05/22   [provider]  Cholecalciferol (OPTIMAL-D PO) Take 1 tablet by mouth daily.    [provider]  Cholecalciferol 125 MCG (5000 UT) TABS     [provider]  fluorometholone (FML) 0.1 % ophthalmic ointment     [provider]  levothyroxine (SYNTHROID) 75 MCG tablet Take 1 tablet (75 mcg total) by mouth daily. 06/13/22   Tower, Wynelle Fanny, MD  Multiple Vitamins-Minerals (PRESERVISION AREDS 2 PO) Take 2 capsules by mouth daily.     [provider]  NON FORMULARY 2 drops daily. sustane    [provider]  OVER THE COUNTER MEDICATION Optimal longevi D K2    [provider]  OVER THE COUNTER MEDICATION [place] A    [provider]  tamoxifen (NOLVADEX) 20 MG tablet Take 1 tablet (20 mg total) by mouth daily. 04/10/22   Truitt Merle, MD  triamcinolone ointment (KENALOG) 0.1 %     [provider]     Family History  Problem Relation Age of Onset   Cancer Mother        lymphoma   Cancer Brother        lymphoma   Cancer Daughter        NHL    Social History   Socioeconomic History   Marital status: Married    Spouse name: Not on file   Number of children: 2   Years of education: Not on file   Highest education level: Not on file  Occupational History   Not on file  Tobacco Use   Smoking status: Never   Smokeless tobacco: Never  Vaping Use   Vaping Use: Never used  Substance and Sexual Activity   Alcohol use: Yes    Alcohol/week: 0.0 standard drinks of alcohol    Comment: occasional   Drug use: No   Sexual  activity: Yes  Other Topics Concern   Not on file  Social History Narrative   Not on file   Social Determinants of Health   Financial Resource Strain: Low Risk  (06/11/2022)   Overall Financial Resource Strain (CARDIA)    Difficulty of Paying Living Expenses: Not hard at all  Food Insecurity: No Food Insecurity (06/11/2022)   Hunger Vital Sign    Worried About Running Out of Food in the Last Year: Never true    Ran Out of Food in the Last Year: Never true  Transportation Needs: No Transportation Needs (06/11/2022)   PRAPARE - Hydrologist (Medical): No    Lack of Transportation (Non-Medical): No  Physical  Activity: Insufficiently Active (06/11/2022)   Exercise Vital Sign    Days of Exercise per Week: 2 days    Minutes of Exercise per Session: 20 min  Stress: No Stress Concern Present (06/11/2022)   [place]    Feeling of Stress : Only a little  Social Connections: Moderately Isolated (06/11/2022)   Social Connection and Isolation Panel [NHANES]    Frequency of Communication with Friends and Family: More than three times a week    Frequency of Social Gatherings with Friends and Family: More than three times a week    Attends Religious Services: More than 4 times per year    Active Member of [employer] or Organizations: No    Attends Archivist Meetings: Never    Marital Status: Widowed     Review of Systems denies fever, headache, chest pain, dyspnea, cough, abdominal/back pain, nausea, vomiting or bleeding  Vital Signs: BP 131/81 (BP Location: Right Arm)   Pulse 80   Temp 98.8 F (37.1 C) (Oral)   Resp 15   SpO2 100%      Physical Exam awake, alert.  Chest clear to auscultation bilaterally.  Heart with regular rate and rhythm.  Abdomen soft, positive bowel sounds, nontender.  No lower extremity edema.  Imaging: No results found.  Labs:  CBC: Recent Labs    06/09/22 0800 07/25/22 0743 08/18/22 0827 08/18/22 0932 09/09/22 0810  WBC 1.6 Repeated and verified X2.* 1.6 Repeated and verified X2.* 1.6*  --  1.4*  HGB 11.6* 11.5* 11.8*  --  12.0  HCT 32.9* 33.4* 34.7* 37.2 35.5*  PLT 132.0* 122.0* 114*  --  116*    COAGS: No results for input(s): "INR", "APTT" in the last 8760 hours.  BMP: Recent Labs    01/01/22 1002 04/10/22 0929 06/09/22 0800 08/18/22 0827 09/09/22 0810  NA 140 142 142 143 141  K 4.1 4.1 4.0 4.1 4.1  CL 109 110 107 110 108  CO2 _0 GLUCOSE 91 71 95 94 88  BUN _1 CALCIUM 8.3* 8.6* 8.1* 8.2* 8.6*  CREATININE 0.87 1.12* 0.99 0.92 0.93  GFRNONAA  >60 50*  --  >60 >60    LIVER FUNCTION TESTS: Recent Labs    04/10/22 0929 06/09/22 0800 08/18/22 0827 09/09/22 0810  BILITOT 1.2 1.1 1.1 1.1  AST _2 ALT _3 ALKPHOS 62 52 61 67  PROT 6.3* 5.8* 5.7* 6.2*  ALBUMIN 3.7 3.7 3.5 3.6    TUMOR MARKERS: No results for input(s): "AFPTM", "CEA", "CA199", "CHROMGRNA" in the last 8760 hours.  Assessment and Plan: 76 y.o. female with PMH sig for RA, left breast  cancer 2021, hypothyroidism, TIA who presents now with chronic leukopenia and worsening pancytopenia. She is scheduled today for CT guided bone marrow biopsy for further evaluation.Risks and benefits of procedure was discussed with the patient/daughter including, but not limited to bleeding, infection, damage to adjacent structures or low yield requiring additional tests.  All of the questions were answered and there is agreement to proceed.  Consent signed and in chart.    Thank you for this interesting consult.  I greatly enjoyed meeting Jennifer Sellers and look forward to participating in their care.  A copy of this report was sent to the requesting provider on this date.  Electronically Signed: D. Rowe Robert, PA-C 09/25/2022, 8:48 AM   I spent a total of 20 minutes    in face to face in clinical consultation, greater than 50% of which was counseling/coordinating care for CT guided bone marrow biopsy

## 2022-09-25 NOTE — Progress Notes (Signed)
Date and time results received: 09/25/22 0925 (use smartphrase ".now" to insert current time)  Test:  Neutro Abs  0.4 Low Panic    Critical Value: 0.4  Name of Provider Notified: Rowe Robert, RA  Orders Received? Or Actions Taken?:  No orders given

## 2022-09-25 NOTE — Discharge Instructions (Signed)

## 2022-09-30 ENCOUNTER — Other Ambulatory Visit: Payer: Self-pay

## 2022-10-01 LAB — SURGICAL PATHOLOGY

## 2022-10-03 ENCOUNTER — Encounter (HOSPITAL_COMMUNITY): Payer: Self-pay | Admitting: Hematology

## 2022-10-07 ENCOUNTER — Ambulatory Visit: Payer: Medicare Other | Admitting: Nurse Practitioner

## 2022-10-07 ENCOUNTER — Inpatient Hospital Stay: Payer: Medicare Other | Attending: Hematology

## 2022-10-07 ENCOUNTER — Other Ambulatory Visit: Payer: Self-pay

## 2022-10-07 ENCOUNTER — Encounter (HOSPITAL_COMMUNITY): Payer: Self-pay | Admitting: Hematology

## 2022-10-07 ENCOUNTER — Inpatient Hospital Stay (HOSPITAL_BASED_OUTPATIENT_CLINIC_OR_DEPARTMENT_OTHER): Payer: Medicare Other | Admitting: Hematology

## 2022-10-07 ENCOUNTER — Telehealth: Payer: Self-pay

## 2022-10-07 ENCOUNTER — Encounter: Payer: Self-pay | Admitting: Hematology

## 2022-10-07 VITALS — BP 123/81 | HR 89 | Temp 98.3°F | Resp 18 | Ht 67.0 in | Wt 136.1 lb

## 2022-10-07 DIAGNOSIS — C50412 Malignant neoplasm of upper-outer quadrant of left female breast: Secondary | ICD-10-CM | POA: Insufficient documentation

## 2022-10-07 DIAGNOSIS — D72820 Lymphocytosis (symptomatic): Secondary | ICD-10-CM | POA: Insufficient documentation

## 2022-10-07 DIAGNOSIS — Z170 Estrogen receptor positive status [ER+]: Secondary | ICD-10-CM | POA: Insufficient documentation

## 2022-10-07 DIAGNOSIS — Z79810 Long term (current) use of selective estrogen receptor modulators (SERMs): Secondary | ICD-10-CM | POA: Insufficient documentation

## 2022-10-07 DIAGNOSIS — D61818 Other pancytopenia: Secondary | ICD-10-CM | POA: Insufficient documentation

## 2022-10-07 DIAGNOSIS — Z90710 Acquired absence of both cervix and uterus: Secondary | ICD-10-CM | POA: Diagnosis not present

## 2022-10-07 DIAGNOSIS — D649 Anemia, unspecified: Secondary | ICD-10-CM

## 2022-10-07 LAB — CBC WITH DIFFERENTIAL (CANCER CENTER ONLY)
Abs Immature Granulocytes: 0 10*3/uL (ref 0.00–0.07)
Basophils Absolute: 0 10*3/uL (ref 0.0–0.1)
Basophils Relative: 1 %
Eosinophils Absolute: 0 10*3/uL (ref 0.0–0.5)
Eosinophils Relative: 1 %
HCT: 35.8 % — ABNORMAL LOW (ref 36.0–46.0)
Hemoglobin: 12.1 g/dL (ref 12.0–15.0)
Immature Granulocytes: 0 %
Lymphocytes Relative: 76 %
Lymphs Abs: 1.3 10*3/uL (ref 0.7–4.0)
MCH: 31 pg (ref 26.0–34.0)
MCHC: 33.8 g/dL (ref 30.0–36.0)
MCV: 91.8 fL (ref 80.0–100.0)
Monocytes Absolute: 0.1 10*3/uL (ref 0.1–1.0)
Monocytes Relative: 4 %
Neutro Abs: 0.3 10*3/uL — CL (ref 1.7–7.7)
Neutrophils Relative %: 18 %
Platelet Count: 134 10*3/uL — ABNORMAL LOW (ref 150–400)
RBC: 3.9 MIL/uL (ref 3.87–5.11)
RDW: 13.6 % (ref 11.5–15.5)
Smear Review: NORMAL
WBC Count: 1.7 10*3/uL — ABNORMAL LOW (ref 4.0–10.5)
nRBC: 0 % (ref 0.0–0.2)

## 2022-10-07 LAB — CMP (CANCER CENTER ONLY)
ALT: 12 U/L (ref 0–44)
AST: 22 U/L (ref 15–41)
Albumin: 3.8 g/dL (ref 3.5–5.0)
Alkaline Phosphatase: 74 U/L (ref 38–126)
Anion gap: 4 — ABNORMAL LOW (ref 5–15)
BUN: 14 mg/dL (ref 8–23)
CO2: 29 mmol/L (ref 22–32)
Calcium: 8.8 mg/dL — ABNORMAL LOW (ref 8.9–10.3)
Chloride: 109 mmol/L (ref 98–111)
Creatinine: 0.93 mg/dL (ref 0.44–1.00)
GFR, Estimated: >60 mL/min (ref >60)
Glucose, Bld: 72 mg/dL (ref 70–99)
Potassium: 3.7 mmol/L (ref 3.5–5.1)
Sodium: 142 mmol/L (ref 135–145)
Total Bilirubin: 1.3 mg/dL — ABNORMAL HIGH (ref 0.3–1.2)
Total Protein: 6.2 g/dL — ABNORMAL LOW (ref 6.5–8.1)

## 2022-10-07 LAB — VITAMIN B12: Vitamin B-12: 385 pg/mL (ref 180–914)

## 2022-10-07 LAB — FERRITIN: Ferritin: 34 ng/mL (ref 11–307)

## 2022-10-07 NOTE — Progress Notes (Signed)
[place]   [phone] [phone]   Clinic Follow up Note   Patient Care Team: Tower, Jennifer Fanny, MD as PCP - General Lad, Jennifer Bookman, MD as Referring Physician (Ophthalmology) Jennifer Robson, MD as Referring Physician (Ophthalmology) Jennifer Sellers, DC as Consulting Physician (Chiropractic Medicine) Jennifer Kaufmann, RN as Oncology Nurse Navigator Jennifer Germany, RN as Oncology Nurse Navigator Jennifer Kussmaul, MD as Consulting Physician (General Surgery) Jennifer Merle, MD as Consulting Physician (Hematology) Jennifer Rudd, MD as Consulting Physician (Radiation Oncology) Jennifer Feeling, NP as Nurse Practitioner (Nurse Practitioner)  Date of Service:  10/07/2022  CHIEF COMPLAINT: f/u of leukopenia, left breast cancer  CURRENT THERAPY: Pending  ASSESSMENT:  Jennifer Sellers is a 76 y.o. female with   Malignant neoplasm of upper-outer quadrant of left breast in female, estrogen receptor positive ([place]) -diagnosed in 08/2020. 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 Oncotype RS 12, low risk. S/p adjuvant radiation. -she began Tamoxifen 12/2020, tolerating well overall. Held since 08/18/22 for work up of leukopenia  Other pancytopenia ([place]) -she has had chronic leukopenia since 2012 and likely related to her RA, WBC previously stable above 2.2 and ANC above 1K -her blood counts have been dropping lately-- WBC 1.6 since 06/09/22, ANC down to 0.4 since 07/25/22, and platelets down to 114k on 08/18/22. Light chain levels from 08/18/22 were only slightly elevated. Hepatitis and HIV both negative. -bone marrow biopsy on 09/25/22 showed: Hypercellular bone marrow with an atypical T-cell lymphocytosis (30% to 60%; aspirate versus flow cytometry) consisting predominantly of CD8 positive T cells with dim CD5. Morphologically large granular lymphocytes are not detected; however, the  phenotype and clinical history of rheumatoid arthritis suggest possible large granular lymphocytosis.  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     PLAN: --continue to hold tamoxifen  -Reviewed Bone Marrow Biopsy results in detail  -Molecular testing on Bone Marrow is still pending  -Order Flow Cytometry for LGL panel today, spoke with lab today, it will be sent to [facility]  -will call pt when I have the above lab results. If she has LGL, will refer her to hematology at [place]: Oncology History Overview Note  Cancer Staging Malignant neoplasm of upper-outer quadrant of left breast in female, estrogen receptor positive ([place]) Staging form: Breast, AJCC 8th Edition - Clinical stage from 08/29/2020: Stage IA (cT1c, cN0, cM0, G1, ER+, PR+, HER2-) - Unsigned - Pathologic stage from 10/31/2020: Stage IA (pT2, pN0(sn), cM0, G1, ER+, PR+, HER2-, Oncotype DX score: 12) - Unsigned    Malignant neoplasm of upper-outer quadrant of left breast in female, estrogen receptor positive ([place])  08/02/2020 Mammogram   IMPRESSION The 1cm (0.9x1.2x0.9cm on US)asymmetry in the left breast at 1:00 position, 4 cmfn is indeterminate   08/22/2020 Initial Biopsy   Diagnosis 08/22/20 Breast, left, needle core biopsy, 1 o'clock, 4cmfn -INVASIVE MAMMARY CARCINOMA -SEE COMMENT       -Grade 1 or 3  E-cadherin is NEGATIVE supporting lobular origin   08/22/2020 Receptors her2   ER - 95%  PR - 25%  Ki67 - 10% HER2 Negative on [facility]   08/24/2020 Initial Diagnosis   Malignant neoplasm of upper-outer quadrant of left breast in female, estrogen receptor positive ([place])   08/29/2020 Breast MRI   IMPRESSION: 1. Enhancing mass in the  upper slightly outer left breast measuring approximately 2.4 x 1.6 x 1.8 cm, consistent with the patient's known malignancy. 2. No MRI evidence of malignancy in the right breast. 3.  No suspicious lymphadenopathy.   10/05/2020 Surgery   LEFT BREAST LUMPECTOMY WITH RADIOACTIVE SEED AND SENTINEL LYMPH NODE BIOPSY by Dr Marlou Starks    10/05/2020 Pathology Results   FINAL MICROSCOPIC DIAGNOSIS:   A. LYMPH NODE, LEFT AXILLARY #1, SENTINEL, EXCISION:  -  No carcinoma identified in one lymph node (0/1)  -  See comment   B. LYMPH NODE, LEFT AXILLARY, SENTINEL, EXCISION:  -  No carcinoma identified in one lymph node (0/1)  -  See comment   Jennifer. LYMPH NODE, LEFT AXILLARY #2, SENTINEL, EXCISION:  -  No carcinoma identified in one lymph node (0/1)  -  See comment   D. BREAST, LEFT, LUMPECTOMY:  -  Invasive lobular carcinoma, Nottingham grade 1 of 3, 2.3 cm  -  Margins uninvolved by carcinoma (<0.1 cm; superior margin)  -  Previous biopsy site changes present  -  See oncology table and comment below    10/05/2020 Oncotype testing   Oncoytpe  Recurrence Score 12 with distant recurrence risk at 9 years with AI or Tamoxifen at 3%  There is less than 1% benefit of chemotherapy.    11/19/2020 - 12/14/2020 Radiation Therapy   Adjuvant Radiation with Dr Lisbeth Renshaw    12/2020 -  Anti-estrogen oral therapy   Tamoxifen 21m daily starting 12/2020    03/13/2021 Survivorship   SCP delivered by LCira Rue NP      INTERVAL HISTORY:  CKaidynce Sellers is here for a follow up of leukopenia, left breast cancer  She was last seen by me on 09/11/2022 She presents to the clinic accompanied by daughter. Bone Marrow biopsy went well. Pt experiencing Gum bleeding when brushing teeth. Pt received a print out of report.   All other systems were reviewed with the patient and are negative.  MEDICAL HISTORY:  Past Medical History:  Diagnosis Date   Arthritis    rheumatoid arthritis   Hypothyroidism    Leukopenia    Thyroid disease    TIA (transient ischemic attack) 2011    SURGICAL HISTORY: Past Surgical History:  Procedure Laterality Date   ABDOMINAL HYSTERECTOMY     BREAST LUMPECTOMY WITH  RADIOACTIVE SEED AND SENTINEL LYMPH NODE BIOPSY Left 10/05/2020   Procedure: LEFT BREAST LUMPECTOMY WITH RADIOACTIVE SEED AND SENTINEL LYMPH NODE BIOPSY;  Surgeon: TJovita Kussmaul MD;  [place]  Service: General;  Laterality: Left;  GENERAL AND PECTORAL BLOCK   CARPAL TUNNEL RELEASE     COLONOSCOPY     COLONOSCOPY WITH PROPOFOL N/A 06/30/2018   Procedure: COLONOSCOPY WITH PROPOFOL;  Surgeon: EManya Silvas MD;  [facility]  Service: Endoscopy;  Laterality: N/A;   colpo-ureteroscopy     lens surgery      I have reviewed the social history and family history with the patient and they are unchanged from previous note.  ALLERGIES:  is allergic to misc. sulfonamide containing compounds, cipro [ciprofloxacin hcl], cortisporin [bacitra-neomycin-polymyxin-hc], prevnar [pneumococcal 13-val conj vacc], sulfa antibiotics, and sulfonamide derivatives.  MEDICATIONS:  Current Outpatient Medications  Medication Sig Dispense Refill   Biotin 1 MG CAPS      Cholecalciferol (OPTIMAL-D PO) Take 1 tablet by mouth daily.     Cholecalciferol 125 MCG (5000 UT) TABS      fluorometholone (FML) 0.1 % ophthalmic ointment  levothyroxine (SYNTHROID) 75 MCG tablet Take 1 tablet (75 mcg total) by mouth daily. 90 tablet 3   Multiple Vitamins-Minerals (PRESERVISION AREDS 2 PO) Take 2 capsules by mouth daily.      NON FORMULARY 2 drops daily. sustane     OVER THE COUNTER MEDICATION Optimal longevi D K2     OVER THE COUNTER MEDICATION Liqua A     tamoxifen (NOLVADEX) 20 MG tablet Take 1 tablet (20 mg total) by mouth daily. 90 tablet 1   triamcinolone ointment (KENALOG) 0.1 %      No current facility-administered medications for this visit.    PHYSICAL EXAMINATION: ECOG PERFORMANCE STATUS: 1 - Symptomatic but completely ambulatory  Vitals:   10/07/22 0953  BP: 123/81  Pulse: 89  Resp: 18  Temp: 98.3 F (36.8 Jennifer)  SpO2: 100%   Wt Readings from Last 3 Encounters:   10/07/22 136 lb 1.6 oz (61.7 kg)  09/25/22 130 lb (59 kg)  08/18/22 132 lb 1.6 oz (59.9 kg)     GENERAL:alert, no distress and comfortable SKIN: skin color normal, no rashes or significant lesions EYES: normal, Conjunctiva are pink and non-injected, sclera clear  NEURO: alert & oriented x 3 with fluent speech LABORATORY DATA:  I have reviewed the data as listed    Latest Ref Rng & Units 10/07/2022    9:30 AM 09/25/2022    8:36 AM 09/09/2022    8:10 AM  CBC  WBC 4.0 - 10.5 K/uL 1.7  1.5  1.4   Hemoglobin 12.0 - 15.0 g/dL 12.1  11.5  12.0   Hematocrit 36.0 - 46.0 % 35.8  35.1  35.5   Platelets 150 - 400 K/uL 134  117  116         Latest Ref Rng & Units 10/07/2022    9:30 AM 09/09/2022    8:10 AM 08/18/2022    8:27 AM  CMP  Glucose 70 - 99 mg/dL 72  88  94   BUN 8 - 23 mg/dL _0 Creatinine 0.44 - 1.00 mg/dL 0.93  0.93  0.92   Sodium 135 - 145 mmol/L 142  141  143   Potassium 3.5 - 5.1 mmol/L 3.7  4.1  4.1   Chloride 98 - 111 mmol/L 109  108  110   CO2 22 - 32 mmol/L _1 Calcium 8.9 - 10.3 mg/dL 8.8  8.6  8.2   Total Protein 6.5 - 8.1 g/dL 6.2  6.2  5.7   Total Bilirubin 0.3 - 1.2 mg/dL 1.3  1.1  1.1   Alkaline Phos 38 - 126 U/L 74  67  61   AST 15 - 41 U/L _2 ALT 0 - 44 U/L _3 RADIOGRAPHIC STUDIES: I have personally reviewed the radiological images as listed and agreed with the findings in the report. No results found.    Orders Placed This Encounter  Procedures   Miscellaneous Genetic Test    Please send blood sample to [facility] for LGL flow cytometry   All questions were answered. The patient knows to call the clinic with any problems, questions or concerns. No barriers to learning was detected. The total time spent in the appointment was 40 minutes.     Jennifer Merle, MD 10/07/2022   Felicity Coyer, CMA, am acting as scribe for Jennifer Merle, MD.  I have reviewed the above documentation for accuracy and  completeness, and I agree with the above.

## 2022-10-07 NOTE — Telephone Encounter (Signed)
Critical lab value report by lab: ANC 0.3 Notified Dr. Burr Medico verbally.

## 2022-10-07 NOTE — Assessment & Plan Note (Signed)
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 Light chain levels from 08/18/22 were only slightly elevated. Hepatitis and HIV both negative. -bone marrow biopsy on 09/25/22 showed: Hypercellular bone marrow with an atypical T-cell lymphocytosis (30% to 60%; aspirate versus flow cytometry) consisting predominantly of CD8 positive T cells with dim CD5. Morphologically large granular lymphocytes are not detected; however, the phenotype and clinical history of rheumatoid arthritis suggest possible large granular lymphocytosis.  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

## 2022-10-07 NOTE — Assessment & Plan Note (Signed)
-  diagnosed in 08/2020. 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 Oncotype RS 12, low risk. S/p adjuvant radiation. -she began Tamoxifen 12/2020, tolerating well overall. Held since 08/18/22 for work up of leukopenia

## 2022-10-10 ENCOUNTER — Encounter (HOSPITAL_COMMUNITY): Payer: Self-pay | Admitting: Hematology

## 2022-10-21 NOTE — Progress Notes (Unsigned)
[place]   [phone]) [phone] Fax:(336) [phone]   Clinic Follow up Note   Patient Care Team: Tower, Wynelle Fanny, MD as PCP - General Lad, Caesar Bookman, MD as Referring Physician (Ophthalmology) Birder Robson, MD as Referring Physician (Ophthalmology) Worthy Keeler, DC as Consulting Physician (Chiropractic Medicine) Mauro Kaufmann, RN as Oncology Nurse Navigator Rockwell Germany, RN as Oncology Nurse Navigator Jovita Kussmaul, MD as Consulting Physician (General Surgery) Truitt Merle, MD as Consulting Physician (Hematology) Kyung Rudd, MD as Consulting Physician (Radiation Oncology) Alla Feeling, NP as Nurse Practitioner (Nurse Practitioner)  Date of Service:  10/22/2022  I connected with Jennifer Sellers on 10/22/2022 at  3:00 PM EST by telephone visit and verified that I am speaking with the correct person using two identifiers.  I discussed the limitations, risks, security and privacy concerns of performing an evaluation and management service by telephone and the availability of in person appointments. I also discussed with the patient that there may be a patient responsible charge related to this service. The patient expressed understanding and agreed to proceed.   Other persons participating in the visit and their role in the encounter is he daughters.  Patient's location:  home Provider's location:  Office  CHIEF COMPLAINT: Discuss recent lab results   CURRENT THERAPY:  Pending  ASSESSMENT & PLAN:  Jennifer Sellers is a 76 y.o. 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  -bone marrow biopsy on 09/25/22 showed: Hypercellular bone marrow with an atypical T-cell  lymphocytosis (30% to 60%; aspirate versus flow cytometry) consisting predominantly of CD8 positive T cells with dim CD5. Morphologically large granular lymphocytes are not detected; however, the phenotype and clinical history of rheumatoid arthritis suggest possible large granular lymphocytosis.  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  -further molecular testing showed T-cell garma gene rearrangement positive, and LGL flow cytometry showed increased T-cell large granular lymphocyte (28.6% total cells), supporting the diagnosis of LGL.  I discussed the above findings with patient and her daughters, and discussed the diagnosis of LGL leukemia, and overall prognosis.  I also briefly discussed options, given her significant neutropenia, very mild thrombocytopenia, no anemia, and her history of rheumatoid arthritis, I think weekly methotrexate is probably a good option. I told pt that LGL leukemia is very rare, I do not have much experience, and I will refer her to see LGL leukemia expert Dr. Wallis Mart at [facility] for evaluation and management. I will be happy to see her back and management her treatment if needed.   2. Malignant neoplasm of upper-outer quadrant of left breast in female, estrogen receptor positive ([place]) -diagnosed in 08/2020. 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 Oncotype RS 12, low risk. S/p adjuvant radiation. -she began Tamoxifen 12/2020, tolerating well overall. Held since 08/18/22 for work up of leukopenia   PLAN: -Discuss the recent genetic and flow cytometry results.  -Discuss the diagnosis of LGL leukemia  -Discuss treatment options  -Referral to Hematology Dr. Wallis Mart at [facility] -I will see her back as needed, will be happy to  manage her treatment in collaboration with Dr. Wallis Mart    No problem-specific Assessment & Plan  notes found for this encounter.    SUMMARY OF ONCOLOGIC HISTORY: Oncology History Overview Note  Cancer Staging Malignant neoplasm of upper-outer quadrant of left breast in female, estrogen receptor positive ([place]) Staging form: Breast, AJCC 8th Edition - Clinical stage from 08/29/2020: Stage IA (cT1c, cN0, cM0, G1, ER+, PR+, HER2-) - Unsigned - Pathologic stage from 10/31/2020: Stage IA (pT2, pN0(sn), cM0, G1, ER+, PR+, HER2-, Oncotype DX score: 12) - Unsigned    Malignant neoplasm of upper-outer quadrant of left breast in female, estrogen receptor positive ([place])  08/02/2020 Mammogram   IMPRESSION The 1cm (0.9x1.2x0.9cm on US)asymmetry in the left breast at 1:00 position, 4 cmfn is indeterminate   08/22/2020 Initial Biopsy   Diagnosis 08/22/20 Breast, left, needle core biopsy, 1 o'clock, 4cmfn -INVASIVE MAMMARY CARCINOMA -SEE COMMENT       -Grade 1 or 3  E-cadherin is NEGATIVE supporting lobular origin   08/22/2020 Receptors her2   ER - 95%  PR - 25%  Ki67 - 10% HER2 Negative on [facility]   08/24/2020 Initial Diagnosis   Malignant neoplasm of upper-outer quadrant of left breast in female, estrogen receptor positive ([place])   08/29/2020 Breast MRI   IMPRESSION: 1. Enhancing mass in the upper slightly outer left breast measuring approximately 2.4 x 1.6 x 1.8 cm, consistent with the patient's known malignancy. 2. No MRI evidence of malignancy in the right breast. 3. No suspicious lymphadenopathy.   10/05/2020 Surgery   LEFT BREAST LUMPECTOMY WITH RADIOACTIVE SEED AND SENTINEL LYMPH NODE BIOPSY by Dr Marlou Starks    10/05/2020 Pathology Results   FINAL MICROSCOPIC DIAGNOSIS:   A. LYMPH NODE, LEFT AXILLARY #1, SENTINEL, EXCISION:  -  No carcinoma identified in one lymph node (0/1)  -  See comment   B. LYMPH NODE, LEFT AXILLARY, SENTINEL, EXCISION:  -  No carcinoma identified in one lymph node (0/1)  -  See comment   C. LYMPH NODE, LEFT AXILLARY #2, SENTINEL, EXCISION:  -  No  carcinoma identified in one lymph node (0/1)  -  See comment   D. BREAST, LEFT, LUMPECTOMY:  -  Invasive lobular carcinoma, Nottingham grade 1 of 3, 2.3 cm  -  Margins uninvolved by carcinoma (<0.1 cm; superior margin)  -  Previous biopsy site changes present  -  See oncology table and comment below    10/05/2020 Oncotype testing   Oncoytpe  Recurrence Score 12 with distant recurrence risk at 9 years with AI or Tamoxifen at 3%  There is less than 1% benefit of chemotherapy.    11/19/2020 - 12/14/2020 Radiation Therapy   Adjuvant Radiation with Dr Lisbeth Renshaw    12/2020 -  Anti-estrogen oral therapy   Tamoxifen 20m daily starting 12/2020    03/13/2021 Survivorship   SCP delivered by LCira Rue NP      INTERVAL HISTORY:  Jennifer Sellers was contacted for a follow up of leukopenia, left breast cancer . She was last seen by me on 10/07/2022  Pt states that she has no concerns. Pt asked about Clinical trials for treatment. .    All other systems were reviewed with the patient and are negative.  MEDICAL HISTORY:  Past Medical History:  Diagnosis Date   Arthritis    rheumatoid arthritis   Hypothyroidism    Leukopenia    Thyroid disease    TIA (transient ischemic attack) 2011    SURGICAL HISTORY: Past Surgical  History:  Procedure Laterality Date   ABDOMINAL HYSTERECTOMY     BREAST LUMPECTOMY WITH RADIOACTIVE SEED AND SENTINEL LYMPH NODE BIOPSY Left 10/05/2020   Procedure: LEFT BREAST LUMPECTOMY WITH RADIOACTIVE SEED AND SENTINEL LYMPH NODE BIOPSY;  Surgeon: Jovita Kussmaul, MD;  Location: [place];  Service: General;  Laterality: Left;  GENERAL AND PECTORAL BLOCK   CARPAL TUNNEL RELEASE     COLONOSCOPY     COLONOSCOPY WITH PROPOFOL N/A 06/30/2018   Procedure: COLONOSCOPY WITH PROPOFOL;  Surgeon: Manya Silvas, MD;  Location: [facility];  Service: Endoscopy;  Laterality: N/A;   colpo-ureteroscopy     lens surgery      I have reviewed the social history  and family history with the patient and they are unchanged from previous note.  ALLERGIES:  is allergic to misc. sulfonamide containing compounds, cipro [ciprofloxacin hcl], cortisporin [bacitra-neomycin-polymyxin-hc], prevnar [pneumococcal 13-val conj vacc], sulfa antibiotics, and sulfonamide derivatives.  MEDICATIONS:  Current Outpatient Medications  Medication Sig Dispense Refill   Biotin 1 MG CAPS      Cholecalciferol (OPTIMAL-D PO) Take 1 tablet by mouth daily.     Cholecalciferol 125 MCG (5000 UT) TABS      fluorometholone (FML) 0.1 % ophthalmic ointment      levothyroxine (SYNTHROID) 75 MCG tablet Take 1 tablet (75 mcg total) by mouth daily. 90 tablet 3   Multiple Vitamins-Minerals (PRESERVISION AREDS 2 PO) Take 2 capsules by mouth daily.      NON FORMULARY 2 drops daily. sustane     OVER THE COUNTER MEDICATION Optimal longevi D K2     OVER THE COUNTER MEDICATION Liqua A     tamoxifen (NOLVADEX) 20 MG tablet Take 1 tablet (20 mg total) by mouth daily. 90 tablet 1   triamcinolone ointment (KENALOG) 0.1 %      No current facility-administered medications for this visit.    PHYSICAL EXAMINATION: ECOG PERFORMANCE STATUS: 1 - Symptomatic but completely ambulatory  There were no vitals filed for this visit. Wt Readings from Last 3 Encounters:  10/07/22 136 lb 1.6 oz (61.7 kg)  09/25/22 130 lb (59 kg)  08/18/22 132 lb 1.6 oz (59.9 kg)     No vitals taken today, Exam not performed today  LABORATORY DATA:  I have reviewed the data as listed    Latest Ref Rng & Units 10/07/2022    9:30 AM 09/25/2022    8:36 AM 09/09/2022    8:10 AM  CBC  WBC 4.0 - 10.5 K/uL 1.7  1.5  1.4   Hemoglobin 12.0 - 15.0 g/dL 12.1  11.5  12.0   Hematocrit 36.0 - 46.0 % 35.8  35.1  35.5   Platelets 150 - 400 K/uL 134  117  116         Latest Ref Rng & Units 10/07/2022    9:30 AM 09/09/2022    8:10 AM 08/18/2022    8:27 AM  CMP  Glucose 70 - 99 mg/dL 72  88  94   BUN 8 - 23 mg/dL _0 Creatinine 0.44 - 1.00 mg/dL 0.93  0.93  0.92   Sodium 135 - 145 mmol/L 142  141  143   Potassium 3.5 - 5.1 mmol/L 3.7  4.1  4.1   Chloride 98 - 111 mmol/L 109  108  110   CO2 22 - 32 mmol/L _1 Calcium 8.9 - 10.3 mg/dL 8.8  8.6  8.2   Total Protein 6.5 - 8.1 g/dL 6.2  6.2  5.7   Total Bilirubin 0.3 - 1.2 mg/dL 1.3  1.1  1.1   Alkaline Phos 38 - 126 U/L 74  67  61   AST 15 - 41 U/L _0 ALT 0 - 44 U/L _1 RADIOGRAPHIC STUDIES: I have personally reviewed the radiological images as listed and agreed with the findings in the report. No results found.    No orders of the defined types were placed in this encounter.  All questions were answered. The patient knows to call the clinic with any problems, questions or concerns. No barriers to learning was detected. The total time spent in the appointment was 22 minutes.     Truitt Merle, MD 10/22/2022   Felicity Coyer am acting as scribe for Truitt Merle, MD.   I have reviewed the above documentation for accuracy and completeness, and I agree with the above.

## 2022-10-22 ENCOUNTER — Inpatient Hospital Stay: Payer: Medicare Other | Attending: Hematology | Admitting: Hematology

## 2022-10-22 ENCOUNTER — Encounter: Payer: Self-pay | Admitting: Hematology

## 2022-10-22 DIAGNOSIS — C91Z0 Other lymphoid leukemia not having achieved remission: Secondary | ICD-10-CM

## 2022-10-22 DIAGNOSIS — C50412 Malignant neoplasm of upper-outer quadrant of left female breast: Secondary | ICD-10-CM | POA: Diagnosis not present

## 2022-10-22 DIAGNOSIS — Z79810 Long term (current) use of selective estrogen receptor modulators (SERMs): Secondary | ICD-10-CM | POA: Diagnosis not present

## 2022-10-22 DIAGNOSIS — Z170 Estrogen receptor positive status [ER+]: Secondary | ICD-10-CM

## 2022-10-22 DIAGNOSIS — Z90710 Acquired absence of both cervix and uterus: Secondary | ICD-10-CM

## 2022-10-23 ENCOUNTER — Encounter (HOSPITAL_COMMUNITY): Payer: Self-pay | Admitting: Hematology

## 2022-10-29 ENCOUNTER — Other Ambulatory Visit: Payer: Self-pay

## 2022-10-29 DIAGNOSIS — C91Z0 Other lymphoid leukemia not having achieved remission: Secondary | ICD-10-CM

## 2022-10-29 NOTE — Progress Notes (Signed)
Per verbal order from Dr. Burr Medico, referral sent to Dr. Antionette Fairy, [place] [place]. With [facility] [phone]).  Faxed referral order, pt demographics, Dr. Ernestina Penna last office not, molecular pathology report, and surgical pathology report to Dr. Omer Jack office.  Fax confirmation received.

## 2022-11-07 ENCOUNTER — Telehealth: Payer: Self-pay | Admitting: Family Medicine

## 2022-11-07 ENCOUNTER — Encounter: Payer: Self-pay | Admitting: Nurse Practitioner

## 2022-11-07 ENCOUNTER — Ambulatory Visit (INDEPENDENT_AMBULATORY_CARE_PROVIDER_SITE_OTHER): Payer: Medicare Other | Admitting: Nurse Practitioner

## 2022-11-07 VITALS — BP 116/66 | HR 98 | Temp 98.0°F | Resp 16 | Ht 67.0 in | Wt 134.4 lb

## 2022-11-07 DIAGNOSIS — J00 Acute nasopharyngitis [common cold]: Secondary | ICD-10-CM | POA: Diagnosis not present

## 2022-11-07 DIAGNOSIS — J0140 Acute pansinusitis, unspecified: Secondary | ICD-10-CM | POA: Insufficient documentation

## 2022-11-07 MED ORDER — LORATADINE 10 MG PO TABS: 10.0000 mg | ORAL_TABLET | Freq: Every day | ORAL | 0 refills | Status: DC

## 2022-11-07 MED ORDER — AMOXICILLIN-POT CLAVULANATE 875-125 MG PO TABS: 1.0000 | ORAL_TABLET | Freq: Two times a day (BID) | ORAL | 0 refills | Status: AC

## 2022-11-07 NOTE — Telephone Encounter (Signed)
Patient daughter called and stated patient didn't call her during her appointment and she wants a call about the information of the appointment she had this morning. Call back number [phone].

## 2022-11-07 NOTE — Telephone Encounter (Signed)
Called and spoke to pt's daughter, Lattie Haw, per [place]. She wanted to make sure [facility] [place] was aware pt had Leukemia. I advised I was not aware of what was discussed, but that I did not feel it would have made a difference in his plan of treatment. If he thinks different, we will let them know.

## 2022-11-07 NOTE — Assessment & Plan Note (Signed)
Treat with Augmentin 875-125 mg twice daily for 7 days.  Follow-up if no improvement

## 2022-11-07 NOTE — Telephone Encounter (Signed)
Patient made me aware and the treatment will be fine

## 2022-11-07 NOTE — Assessment & Plan Note (Signed)
Patient to be treated with Claritin 10 mg daily.

## 2022-11-07 NOTE — Patient Instructions (Addendum)
Nice to see you today I have sent in antibiotics and an antihistamine to the pharmacy Should start improving over the next 2-3 days Rest and drink plenty of fluid Follow up if no improvement

## 2022-11-07 NOTE — Progress Notes (Signed)
Acute Office Visit  Subjective:     Patient ID: KHAYA THEISSEN, female    DOB: 12/03/1945, 76 y.o.   MRN: [id number]  Chief Complaint  Patient presents with   Nasal Congestion    X 2 weeks   Cough    X 2 weeks     Patient is in today for sick symptoms With a history of RA,Breast cancer, hld  Symptoms started approx 2 weeks ago States that she went to a dinner and the person acorss her and they were coughing. Covid vaccines: pfizer x2 and two booster Flu vaccine: not up to date States that she has been using Chloraseptic spray and zicam States that it has not offered great relief this time  Review of Systems  Constitutional:  Positive for malaise/fatigue. Negative for chills and fever.       Appetite has been normal Fluid intake is ok  HENT:  Positive for sinus pain and sore throat. Negative for ear discharge and ear pain.   Respiratory:  Positive for cough, sputum production (intermittent) and shortness of breath (unsure if it is at rest or DOE).   Gastrointestinal:  Negative for abdominal pain, diarrhea, nausea and vomiting.  Musculoskeletal:  Negative for joint pain and myalgias.  Neurological:  Negative for headaches.        Objective:    BP 116/66   Pulse 98   Temp 98 F (36.7 C)   Resp 16   Ht '5\' 7"'$  (1.702 m)   Wt 134 lb 6 oz (61 kg)   SpO2 100%   BMI 21.05 kg/m    Physical Exam Vitals and nursing note reviewed.  Constitutional:      Appearance: Normal appearance.  HENT:     Right Ear: Tympanic membrane, ear canal and external ear normal.     Left Ear: Tympanic membrane, ear canal and external ear normal.     Nose:     Right Sinus: Maxillary sinus tenderness and frontal sinus tenderness present.     Left Sinus: Maxillary sinus tenderness and frontal sinus tenderness present.     Mouth/Throat:     Mouth: Mucous membranes are moist.     Pharynx: Oropharynx is clear.  Cardiovascular:     Rate and Rhythm: Normal rate and regular rhythm.     Heart  sounds: Normal heart sounds.  Pulmonary:     Effort: Pulmonary effort is normal.     Breath sounds: Normal breath sounds.  Lymphadenopathy:     Cervical: No cervical adenopathy.  Neurological:     Mental Status: She is alert.     No results found for any visits on 11/07/22.      Assessment & Plan:   Problem List Items Addressed This Visit       Respiratory   Acute non-recurrent pansinusitis - Primary    Treat with Augmentin 875-125 mg twice daily for 7 days.  Follow-up if no improvement      Relevant Medications   amoxicillin-clavulanate (AUGMENTIN) 875-125 MG tablet   loratadine (CLARITIN) 10 MG tablet   Acute rhinitis    Patient to be treated with Claritin 10 mg daily.      Relevant Medications   loratadine (CLARITIN) 10 MG tablet    Meds ordered this encounter  Medications   amoxicillin-clavulanate (AUGMENTIN) 875-125 MG tablet    Sig: Take 1 tablet by mouth 2 (two) times daily for 7 days.    Dispense:  14 tablet    Refill:  0    Order Specific Question:   Supervising Provider    Answer:   Loura Pardon A [[id number]]   loratadine (CLARITIN) 10 MG tablet    Sig: Take 1 tablet (10 mg total) by mouth daily.    Dispense:  30 tablet    Refill:  0    Order Specific Question:   Supervising Provider    Answer:   TOWER, MARNE A [[id number]]    Return if symptoms worsen or fail to improve.  Romilda Garret, NP

## 2022-11-21 ENCOUNTER — Telehealth: Payer: Self-pay

## 2022-11-21 NOTE — Telephone Encounter (Signed)
Called [facility] Dr. Wallis Mart to see if we could get this patient seen before the appointment that was given in March 24. Dr. Burr Medico wants her seen in January. Spoke with Forbes Cellar she stated that she would talk to the medical team and see if they can get her worked in sooner. Waiting to hear back from [place]. Will call back if no response on Monday. If we can't get her in will need to recommend her to see Dr. Irene Limbo or an hematologist at [facility].

## 2022-11-29 ENCOUNTER — Other Ambulatory Visit: Payer: Self-pay | Admitting: Nurse Practitioner

## 2022-11-29 DIAGNOSIS — J00 Acute nasopharyngitis [common cold]: Secondary | ICD-10-CM

## 2022-11-29 DIAGNOSIS — J0140 Acute pansinusitis, unspecified: Secondary | ICD-10-CM

## 2022-12-08 NOTE — Progress Notes (Signed)
[place]   [phone] [phone]) [phone]   Clinic Follow up Note   Patient Care Team: Tower, Wynelle Fanny, MD as PCP - General Lad, Caesar Bookman, MD as Referring Physician (Ophthalmology) Birder Robson, MD as Referring Physician (Ophthalmology) Worthy Keeler, DC as Consulting Physician (Chiropractic Medicine) Mauro Kaufmann, RN as Oncology Nurse Navigator Rockwell Germany, RN as Oncology Nurse Navigator Jovita Kussmaul, MD as Consulting Physician (General Surgery) Truitt Merle, MD as Consulting Physician (Hematology) Kyung Rudd, MD as Consulting Physician (Radiation Oncology) Alla Feeling, NP as Nurse Practitioner (Nurse Practitioner) Stephanie Acre., MD as Consulting Physician (Hematology and Oncology)  Date of Service:  12/09/2022  CHIEF COMPLAINT: f/u of leukopenia, left breast cancer   CURRENT THERAPY:  Methotrexate 5 mg 1wk starting 12/14/2022 and increase to 10 mg on week 2   ASSESSMENT:  Jennifer Sellers is a 77 y.o. 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  -bone marrow biopsy on 09/25/22 showed: Hypercellular bone marrow with an atypical T-cell lymphocytosis (30% to 60%; aspirate versus flow cytometry) consisting predominantly of CD8 positive T cells with dim CD5. Morphologically large granular lymphocytes are not detected; however, the phenotype and clinical history of rheumatoid arthritis suggest possible large granular lymphocytosis.  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  -further molecular testing showed T-cell garma gene rearrangement positive, and LGL flow cytometry showed increased T-cell large  granular lymphocyte (28.6% total cells), supporting the diagnosis of LGL -I have referred her to LGL leukemia expert Dr. Wallis Mart at [facility] for evaluation and management, however her appointment is in March 2024. -We discussed the treatment options, I recommend her to start methotrexate, especially within her history of rheumatoid arthritis.  Potential benefit and side effects discussed with her.  Malignant neoplasm of upper-outer quadrant of left breast in female, estrogen receptor positive ([place]) -diagnosed in 08/2020. 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 Oncotype RS 12, low risk. S/p adjuvant radiation. -she began Tamoxifen 12/2020, tolerating well overall. Held since 08/18/22 for work up of leukopenia -will restart tamoxifen.     PLAN: -lab reviewed. WBC 2.5 with ANC 1.2, better than before likely related to his recent sinus infection and steroid use - appt [facility] in March -I recommend her to start oral Methotrexate on 12/14/2022,  5 mg weeklyX2 then  increase to 10 mg weeklyX2 -lab in 2wk ,f/u in 4 wks, may increase MTX to '15mg'$  weekly after next visit    SUMMARY OF ONCOLOGIC HISTORY: Oncology History Overview Note  Cancer Staging Malignant neoplasm of upper-outer quadrant of left breast in female, estrogen receptor positive ([place]) Staging form: Breast, AJCC 8th Edition - Clinical stage from 08/29/2020: Stage IA (cT1c, cN0, cM0, G1, ER+, PR+, HER2-) - Unsigned - Pathologic stage from 10/31/2020: Stage IA (pT2, pN0(sn), cM0, G1, ER+, PR+, HER2-, Oncotype DX score: 12) - Unsigned    Malignant neoplasm of upper-outer quadrant of left breast in female, estrogen receptor positive ([place])  08/02/2020 Mammogram   IMPRESSION The 1cm (0.9x1.2x0.9cm on US)asymmetry in the left breast at 1:00 position, 4 cmfn is indeterminate  08/22/2020 Initial Biopsy   Diagnosis 08/22/20 Breast, left, needle core biopsy, 1  o'clock, 4cmfn -INVASIVE MAMMARY CARCINOMA -SEE COMMENT       -Grade 1 or 3  E-cadherin is NEGATIVE supporting lobular origin   08/22/2020 Receptors her2   ER - 95%  PR - 25%  Ki67 - 10% HER2 Negative on [facility]   08/24/2020 Initial Diagnosis   Malignant neoplasm of upper-outer quadrant of left breast in female, estrogen receptor positive ([place])   08/29/2020 Breast MRI   IMPRESSION: 1. Enhancing mass in the upper slightly outer left breast measuring approximately 2.4 x 1.6 x 1.8 cm, consistent with the patient's known malignancy. 2. No MRI evidence of malignancy in the right breast. 3. No suspicious lymphadenopathy.   10/05/2020 Surgery   LEFT BREAST LUMPECTOMY WITH RADIOACTIVE SEED AND SENTINEL LYMPH NODE BIOPSY by Dr Marlou Starks    10/05/2020 Pathology Results   FINAL MICROSCOPIC DIAGNOSIS:   A. LYMPH NODE, LEFT AXILLARY #1, SENTINEL, EXCISION:  -  No carcinoma identified in one lymph node (0/1)  -  See comment   B. LYMPH NODE, LEFT AXILLARY, SENTINEL, EXCISION:  -  No carcinoma identified in one lymph node (0/1)  -  See comment   C. LYMPH NODE, LEFT AXILLARY #2, SENTINEL, EXCISION:  -  No carcinoma identified in one lymph node (0/1)  -  See comment   D. BREAST, LEFT, LUMPECTOMY:  -  Invasive lobular carcinoma, Nottingham grade 1 of 3, 2.3 cm  -  Margins uninvolved by carcinoma (<0.1 cm; superior margin)  -  Previous biopsy site changes present  -  See oncology table and comment below    10/05/2020 Oncotype testing   Oncoytpe  Recurrence Score 12 with distant recurrence risk at 9 years with AI or Tamoxifen at 3%  There is less than 1% benefit of chemotherapy.    11/19/2020 - 12/14/2020 Radiation Therapy   Adjuvant Radiation with Dr Lisbeth Renshaw    12/2020 -  Anti-estrogen oral therapy   Tamoxifen '20mg'$  daily starting 12/2020    03/13/2021 Survivorship   SCP delivered by Cira Rue, NP      INTERVAL HISTORY:  Jennifer Sellers is here for a follow up of LGL leukemia , left  breast cancer   She was last seen by me on 10/22/2022 She presents to the clinic alone. Pt reports of having bronchitis.  Pt states she is less congested.   All other systems were reviewed with the patient and are negative.  MEDICAL HISTORY:  Past Medical History:  Diagnosis Date   Arthritis    rheumatoid arthritis   Hypothyroidism    Leukopenia    Thyroid disease    TIA (transient ischemic attack) 2011    SURGICAL HISTORY: Past Surgical History:  Procedure Laterality Date   ABDOMINAL HYSTERECTOMY     BREAST LUMPECTOMY WITH RADIOACTIVE SEED AND SENTINEL LYMPH NODE BIOPSY Left 10/05/2020   Procedure: LEFT BREAST LUMPECTOMY WITH RADIOACTIVE SEED AND SENTINEL LYMPH NODE BIOPSY;  Surgeon: Jovita Kussmaul, MD;  Location: [place];  Service: General;  Laterality: Left;  GENERAL AND PECTORAL BLOCK   CARPAL TUNNEL RELEASE     COLONOSCOPY     COLONOSCOPY WITH PROPOFOL N/A 06/30/2018   Procedure: COLONOSCOPY WITH PROPOFOL;  Surgeon: Manya Silvas, MD;  Location: [facility] ENDOSCOPY;  Service: Endoscopy;  Laterality: N/A;   colpo-ureteroscopy     lens surgery      I have reviewed the social history and family history with the  patient and they are unchanged from previous note.  ALLERGIES:  is allergic to misc. sulfonamide containing compounds, cipro [ciprofloxacin hcl], cortisporin [bacitra-neomycin-polymyxin-hc], prevnar [pneumococcal 13-val conj vacc], sulfa antibiotics, and sulfonamide derivatives.  MEDICATIONS:  Current Outpatient Medications  Medication Sig Dispense Refill   [START ON 12/28/2022] methotrexate (RHEUMATREX) 5 MG tablet Take 1 tablet (5 mg total) by mouth once a week for 14 days, THEN 2 tablets (10 mg total) once a week for 14 days. Caution: Chemotherapy. Protect from light.. 10 tablet 0   Biotin 1 MG CAPS      Cholecalciferol (OPTIMAL-D PO) Take 1 tablet by mouth daily.     Cholecalciferol 125 MCG (5000 UT) TABS      fluorometholone (FML) 0.1 % ophthalmic  ointment      levothyroxine (SYNTHROID) 75 MCG tablet Take 1 tablet (75 mcg total) by mouth daily. 90 tablet 3   loratadine (CLARITIN) 10 MG tablet Take 1 tablet (10 mg total) by mouth daily as needed for allergies. 90 tablet 3   Multiple Vitamins-Minerals (PRESERVISION AREDS 2 PO) Take 2 capsules by mouth daily.      NON FORMULARY 2 drops daily. sustane     OVER THE COUNTER MEDICATION Optimal longevi D K2     OVER THE COUNTER MEDICATION Liqua A     Prenatal Multivit w/ Iron-FA (NEONATAL FE PO) Take by mouth.     tamoxifen (NOLVADEX) 20 MG tablet Take 1 tablet (20 mg total) by mouth daily. 90 tablet 1   triamcinolone ointment (KENALOG) 0.1 %      No current facility-administered medications for this visit.    PHYSICAL EXAMINATION: ECOG PERFORMANCE STATUS: 1 - Symptomatic but completely ambulatory  Vitals:   12/09/22 1129  BP: 116/77  Pulse: 73  Resp: 16  Temp: 98.2 F (36.8 C)  SpO2: 99%   Wt Readings from Last 3 Encounters:  12/09/22 131 lb 1.6 oz (59.5 kg)  11/07/22 134 lb 6 oz (61 kg)  10/07/22 136 lb 1.6 oz (61.7 kg)     GENERAL:alert, no distress and comfortable SKIN: skin color normal, no rashes or significant lesions EYES: normal, Conjunctiva are pink and non-injected, sclera clear  NEURO: alert & oriented x 3 with fluent speech LABORATORY DATA:  I have reviewed the data as listed    Latest Ref Rng & Units 12/09/2022   10:56 AM 10/07/2022    9:30 AM 09/25/2022    8:36 AM  CBC  WBC 4.0 - 10.5 K/uL 2.5  1.7  1.5   Hemoglobin 12.0 - 15.0 g/dL 13.2  12.1  11.5   Hematocrit 36.0 - 46.0 % 38.7  35.8  35.1   Platelets 150 - 400 K/uL 198  134  117         Latest Ref Rng & Units 12/09/2022   10:56 AM 10/07/2022    9:30 AM 09/09/2022    8:10 AM  CMP  Glucose 70 - 99 mg/dL 85  72  88   BUN 8 - 23 mg/dL '14  14  11   '$ Creatinine 0.44 - 1.00 mg/dL 0.91  0.93  0.93   Sodium 135 - 145 mmol/L 139  142  141   Potassium 3.5 - 5.1 mmol/L 3.8  3.7  4.1   Chloride 98 - 111  mmol/L 105  109  108   CO2 22 - 32 mmol/L '30  29  30   '$ Calcium 8.9 - 10.3 mg/dL 9.0  8.8  8.6   Total Protein 6.5 -  8.1 g/dL 6.6  6.2  6.2   Total Bilirubin 0.3 - 1.2 mg/dL 1.3  1.3  1.1   Alkaline Phos 38 - 126 U/L 87  74  67   AST 15 - 41 U/L '16  22  24   '$ ALT 0 - 44 U/L '6  12  13       '$ RADIOGRAPHIC STUDIES: I have personally reviewed the radiological images as listed and agreed with the findings in the report. No results found.    No orders of the defined types were placed in this encounter.  All questions were answered. The patient knows to call the clinic with any problems, questions or concerns. No barriers to learning was detected. The total time spent in the appointment was 30 minutes.     Truitt Merle, MD 12/09/2022   Felicity Coyer, CMA, am acting as scribe for Truitt Merle, MD.   I have reviewed the above documentation for accuracy and completeness, and I agree with the above.

## 2022-12-09 ENCOUNTER — Encounter: Payer: Self-pay | Admitting: Hematology

## 2022-12-09 ENCOUNTER — Inpatient Hospital Stay: Payer: Medicare Other | Attending: Hematology

## 2022-12-09 ENCOUNTER — Inpatient Hospital Stay (HOSPITAL_BASED_OUTPATIENT_CLINIC_OR_DEPARTMENT_OTHER): Payer: Medicare Other | Admitting: Hematology

## 2022-12-09 ENCOUNTER — Other Ambulatory Visit: Payer: Self-pay

## 2022-12-09 VITALS — BP 116/77 | HR 73 | Temp 98.2°F | Resp 16 | Ht 67.0 in | Wt 131.1 lb

## 2022-12-09 DIAGNOSIS — Z79810 Long term (current) use of selective estrogen receptor modulators (SERMs): Secondary | ICD-10-CM | POA: Insufficient documentation

## 2022-12-09 DIAGNOSIS — C50412 Malignant neoplasm of upper-outer quadrant of left female breast: Secondary | ICD-10-CM

## 2022-12-09 DIAGNOSIS — C91Z0 Other lymphoid leukemia not having achieved remission: Secondary | ICD-10-CM | POA: Diagnosis not present

## 2022-12-09 DIAGNOSIS — Z90710 Acquired absence of both cervix and uterus: Secondary | ICD-10-CM | POA: Diagnosis not present

## 2022-12-09 DIAGNOSIS — D649 Anemia, unspecified: Secondary | ICD-10-CM

## 2022-12-09 DIAGNOSIS — Z170 Estrogen receptor positive status [ER+]: Secondary | ICD-10-CM

## 2022-12-09 LAB — CMP (CANCER CENTER ONLY)
ALT: 6 U/L (ref 0–44)
AST: 16 U/L (ref 15–41)
Albumin: 3.6 g/dL (ref 3.5–5.0)
Alkaline Phosphatase: 87 U/L (ref 38–126)
Anion gap: 4 — ABNORMAL LOW (ref 5–15)
BUN: 14 mg/dL (ref 8–23)
CO2: 30 mmol/L (ref 22–32)
Calcium: 9 mg/dL (ref 8.9–10.3)
Chloride: 105 mmol/L (ref 98–111)
Creatinine: 0.91 mg/dL (ref 0.44–1.00)
GFR, Estimated: >60 mL/min (ref >60)
Glucose, Bld: 85 mg/dL (ref 70–99)
Potassium: 3.8 mmol/L (ref 3.5–5.1)
Sodium: 139 mmol/L (ref 135–145)
Total Bilirubin: 1.3 mg/dL — ABNORMAL HIGH (ref 0.3–1.2)
Total Protein: 6.6 g/dL (ref 6.5–8.1)

## 2022-12-09 LAB — CBC WITH DIFFERENTIAL (CANCER CENTER ONLY)
Abs Immature Granulocytes: 0 10*3/uL (ref 0.00–0.07)
Basophils Absolute: 0 10*3/uL (ref 0.0–0.1)
Basophils Relative: 1 %
Eosinophils Absolute: 0 10*3/uL (ref 0.0–0.5)
Eosinophils Relative: 1 %
HCT: 38.7 % (ref 36.0–46.0)
Hemoglobin: 13.2 g/dL (ref 12.0–15.0)
Immature Granulocytes: 0 %
Lymphocytes Relative: 51 %
Lymphs Abs: 1.3 10*3/uL (ref 0.7–4.0)
MCH: 30.6 pg (ref 26.0–34.0)
MCHC: 34.1 g/dL (ref 30.0–36.0)
MCV: 89.8 fL (ref 80.0–100.0)
Monocytes Absolute: 0.1 10*3/uL (ref 0.1–1.0)
Monocytes Relative: 3 %
Neutro Abs: 1.1 10*3/uL — ABNORMAL LOW (ref 1.7–7.7)
Neutrophils Relative %: 44 %
Platelet Count: 198 10*3/uL (ref 150–400)
RBC: 4.31 MIL/uL (ref 3.87–5.11)
RDW: 13.6 % (ref 11.5–15.5)
WBC Count: 2.5 10*3/uL — ABNORMAL LOW (ref 4.0–10.5)
nRBC: 0 % (ref 0.0–0.2)

## 2022-12-09 LAB — FERRITIN: Ferritin: 60 ng/mL (ref 11–307)

## 2022-12-09 LAB — VITAMIN B12: Vitamin B-12: 445 pg/mL (ref 180–914)

## 2022-12-09 MED ORDER — METHOTREXATE SODIUM 5 MG PO TABS: ORAL_TABLET | ORAL | 0 refills | Status: DC

## 2022-12-09 NOTE — Assessment & Plan Note (Signed)
-  diagnosed in 08/2020. 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 Oncotype RS 12, low risk. S/p adjuvant radiation. -she began Tamoxifen 12/2020, tolerating well overall. Held since 08/18/22 for work up of leukopenia -will restart tamoxifen.

## 2022-12-09 NOTE — Assessment & Plan Note (Signed)
she has had chronic leukopenia since 2012 and likely related to her RA, WBC previously stable above 2.2 and ANC above 1K -her blood counts have been dropping lately-- WBC 1.6 since 06/09/22, ANC down to 0.4 since 07/25/22, and platelets down to 114k on 08/18/22.  -bone marrow biopsy on 09/25/22 showed: Hypercellular bone marrow with an atypical T-cell lymphocytosis (30% to 60%; aspirate versus flow cytometry) consisting predominantly of CD8 positive T cells with dim CD5. Morphologically large granular lymphocytes are not detected; however, the phenotype and clinical history of rheumatoid arthritis suggest possible large granular lymphocytosis.  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  -further molecular testing showed T-cell garma gene rearrangement positive, and LGL flow cytometry showed increased T-cell large granular lymphocyte (28.6% total cells), supporting the diagnosis of LGL -I have referred her to LGL leukemia expert Dr. Wallis Mart at [facility] for evaluation and management, however her appointment is in March 2024. -We discussed the treatment options, I recommend her to start methotrexate, especially within her history of rheumatoid arthritis.  Potential benefit and side effects discussed with her.

## 2022-12-15 ENCOUNTER — Telehealth: Payer: Self-pay

## 2022-12-15 NOTE — Telephone Encounter (Signed)
Pt's daughter Lattie Haw called asking if she and the pt could speak with Dr. Burr Medico regarding the Methotrexate (Rheumatrex) medication Dr. Burr Medico just prescribed.  Lattie Haw stated one of the questions they have regarding this medication is if the pt could take Folic Acid while on Methotrexate.  Lattie Haw stated they have more questions regarding methotrexate they would like to as as well.  Notified Dr. Burr Medico and the Oral Chemotherapy Pharmacist to see if they could also contact pt in the interim regarding drug interactions with Methotrexate.

## 2022-12-16 ENCOUNTER — Telehealth: Payer: Self-pay

## 2022-12-16 NOTE — Telephone Encounter (Signed)
Pt's daughter Jennifer Sellers called stating that Dr. Burr Medico called her sister Jennifer Sellers yesterday in regards to their (pt & daughter Jennifer Sellers) call regarding taking Folic Acid.  Jennifer Sellers stated Jennifer Sellers and Dr. Burr Medico did not discuss any of the question the she and the pt had regarding the pt's Methotrexate.  Jennifer Sellers stated she and the pt picked up the Methotrexate from the pharmacy and the pharmacist stated that the pt should also take Folic Acid while taking Methotrexate and to speak with the pt's Oncologist to see if they are "OK" with the pt taking Folic Acid.  Jennifer Sellers confirmed that she and the pt spoke with Jennifer Sellers PharmD on 12/15/2022 regarding the Folic Acid but Jennifer Sellers did not state that Dr. Burr Medico was "OK" with the pt taking Folic Acid.  Jennifer Sellers also needs to know if Dr. Burr Medico would be writing a prescription for the Folic Acid.  Informed Jennifer Sellers that Folic Acid can be purchased OTC and does not need a prescription.  Jennifer Sellers wants to know how much Folic Acid and how often does Dr. Burr Medico recommends the pt taking.  Jennifer Sellers also wanted to know why the pt was placed on Methotrexate.  Was it because Jennifer Sellers at [facility] recommended the Methotrexate?  Informed Jennifer Sellers and pt that Dr. Burr Medico is out of the office today but will return to the office on Wednesday.  Informed them that this RN will notify Dr. Burr Medico of the their telephone call and Dr. Burr Medico will respond when she returns to the office on Wednesday.

## 2022-12-18 ENCOUNTER — Other Ambulatory Visit: Payer: Self-pay | Admitting: Hematology

## 2022-12-18 MED ORDER — FOLIC ACID 1 MG PO TABS: 1.0000 mg | ORAL_TABLET | Freq: Every day | ORAL | 2 refills | Status: DC

## 2022-12-23 ENCOUNTER — Inpatient Hospital Stay: Payer: Medicare Other | Attending: Hematology

## 2022-12-23 ENCOUNTER — Telehealth: Payer: Self-pay

## 2022-12-23 ENCOUNTER — Other Ambulatory Visit: Payer: Self-pay

## 2022-12-23 DIAGNOSIS — R5383 Other fatigue: Secondary | ICD-10-CM | POA: Diagnosis not present

## 2022-12-23 DIAGNOSIS — Z79810 Long term (current) use of selective estrogen receptor modulators (SERMs): Secondary | ICD-10-CM | POA: Diagnosis not present

## 2022-12-23 DIAGNOSIS — Z90710 Acquired absence of both cervix and uterus: Secondary | ICD-10-CM | POA: Insufficient documentation

## 2022-12-23 DIAGNOSIS — Z170 Estrogen receptor positive status [ER+]: Secondary | ICD-10-CM | POA: Insufficient documentation

## 2022-12-23 DIAGNOSIS — D72820 Lymphocytosis (symptomatic): Secondary | ICD-10-CM | POA: Diagnosis not present

## 2022-12-23 DIAGNOSIS — D72819 Decreased white blood cell count, unspecified: Secondary | ICD-10-CM | POA: Insufficient documentation

## 2022-12-23 DIAGNOSIS — C50412 Malignant neoplasm of upper-outer quadrant of left female breast: Secondary | ICD-10-CM | POA: Diagnosis present

## 2022-12-23 DIAGNOSIS — D649 Anemia, unspecified: Secondary | ICD-10-CM

## 2022-12-23 LAB — CBC WITH DIFFERENTIAL (CANCER CENTER ONLY)
Abs Immature Granulocytes: 0 10*3/uL (ref 0.00–0.07)
Basophils Absolute: 0 10*3/uL (ref 0.0–0.1)
Basophils Relative: 1 %
Eosinophils Absolute: 0 10*3/uL (ref 0.0–0.5)
Eosinophils Relative: 1 %
HCT: 35.7 % — ABNORMAL LOW (ref 36.0–46.0)
Hemoglobin: 12.4 g/dL (ref 12.0–15.0)
Immature Granulocytes: 0 %
Lymphocytes Relative: 77 %
Lymphs Abs: 0.8 10*3/uL (ref 0.7–4.0)
MCH: 31.2 pg (ref 26.0–34.0)
MCHC: 34.7 g/dL (ref 30.0–36.0)
MCV: 89.9 fL (ref 80.0–100.0)
Monocytes Absolute: 0.1 10*3/uL (ref 0.1–1.0)
Monocytes Relative: 7 %
Neutro Abs: 0.2 10*3/uL — CL (ref 1.7–7.7)
Neutrophils Relative %: 14 %
Platelet Count: 129 10*3/uL — ABNORMAL LOW (ref 150–400)
RBC: 3.97 MIL/uL (ref 3.87–5.11)
RDW: 14.6 % (ref 11.5–15.5)
Smear Review: NORMAL
WBC Count: 1.1 10*3/uL — ABNORMAL LOW (ref 4.0–10.5)
nRBC: 0 % (ref 0.0–0.2)

## 2022-12-23 LAB — CMP (CANCER CENTER ONLY)
ALT: 11 U/L (ref 0–44)
AST: 21 U/L (ref 15–41)
Albumin: 3.7 g/dL (ref 3.5–5.0)
Alkaline Phosphatase: 84 U/L (ref 38–126)
Anion gap: 5 (ref 5–15)
BUN: 13 mg/dL (ref 8–23)
CO2: 28 mmol/L (ref 22–32)
Calcium: 9 mg/dL (ref 8.9–10.3)
Chloride: 108 mmol/L (ref 98–111)
Creatinine: 0.91 mg/dL (ref 0.44–1.00)
GFR, Estimated: >60 mL/min (ref >60)
Glucose, Bld: 84 mg/dL (ref 70–99)
Potassium: 4.1 mmol/L (ref 3.5–5.1)
Sodium: 141 mmol/L (ref 135–145)
Total Bilirubin: 1.7 mg/dL — ABNORMAL HIGH (ref 0.3–1.2)
Total Protein: 6.4 g/dL — ABNORMAL LOW (ref 6.5–8.1)

## 2022-12-23 LAB — FERRITIN: Ferritin: 47 ng/mL (ref 11–307)

## 2022-12-23 LAB — VITAMIN B12: Vitamin B-12: 412 pg/mL (ref 180–914)

## 2022-12-23 NOTE — Telephone Encounter (Signed)
Critical lab value reported by lab:  ANC 0.2 today

## 2022-12-24 ENCOUNTER — Telehealth: Payer: Self-pay

## 2022-12-24 NOTE — Telephone Encounter (Signed)
Pt called asking to speak with Dr. Burr Medico regarding a medical question.  Pt then stated the question is related to a dental procedure.  Notified Dr. Burr Medico.

## 2022-12-24 NOTE — Telephone Encounter (Addendum)
Called patients daughter and relayed the below results. Patients daughter voiced understanding     ----- Message from Truitt Merle, MD sent at 12/23/2022 12:14 PM EST ----- Please let pt know her lab results, CBC overall is stable. Tbil is slightly elevated, but not new, let her continue MTX at '5mg'$  week until next visit, do not increase to '10mg'$  weekly, and let her keep her lab and f/u as scheduled, thanks   Truitt Merle  12/23/2022

## 2022-12-25 ENCOUNTER — Telehealth: Payer: Self-pay

## 2022-12-25 NOTE — Telephone Encounter (Signed)
Called patient about a dental procedure she was going to have and wanted to know if she needed an antibiotic before upon calling the patient she stated that she went to the dentist yesterday and was given a new toothpaste to use for gingivitis and didn't know if it was safe to use with all her meds she was taking for her cancer treatment. I told her she was fine to use the toothpaste and it was not going to effect her meds.

## 2023-01-06 ENCOUNTER — Other Ambulatory Visit: Payer: Self-pay

## 2023-01-06 DIAGNOSIS — Z170 Estrogen receptor positive status [ER+]: Secondary | ICD-10-CM

## 2023-01-06 DIAGNOSIS — C50412 Malignant neoplasm of upper-outer quadrant of left female breast: Secondary | ICD-10-CM

## 2023-01-06 DIAGNOSIS — C91Z0 Other lymphoid leukemia not having achieved remission: Secondary | ICD-10-CM

## 2023-01-06 NOTE — Progress Notes (Addendum)
[place]   [phone] [phone]   Clinic Follow up Note   Patient Care Team: Tower, Wynelle Fanny, MD as PCP - General Lad, Caesar Bookman, MD as Referring Physician (Ophthalmology) Birder Robson, MD as Referring Physician (Ophthalmology) Worthy Keeler, DC as Consulting Physician (Chiropractic Medicine) Mauro Kaufmann, RN as Oncology Nurse Navigator Rockwell Germany, RN as Oncology Nurse Navigator Jovita Kussmaul, MD as Consulting Physician (General Surgery) Truitt Merle, MD as Consulting Physician (Hematology) Kyung Rudd, MD as Consulting Physician (Radiation Oncology) Alla Feeling, NP as Nurse Practitioner (Nurse Practitioner) Stephanie Acre., MD as Consulting Physician (Hematology and Oncology)  Date of Service:  01/07/2023  CHIEF COMPLAINT: f/u of leukopenia, left breast cancer     CURRENT THERAPY:   Methotrexate 5 mg weekly started 12/14/2022 and increase to 10 mg on week 3 then 15mg  weekly on  01/11/2023  ASSESSMENT:  Jennifer Sellers is a 77 y.o. 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  -bone marrow biopsy on 09/25/22 showed: Hypercellular bone marrow with an atypical T-cell lymphocytosis (30% to 60%; aspirate versus flow cytometry) consisting predominantly of CD8 positive T cells with dim CD5. Morphologically large granular lymphocytes are not detected; however, the phenotype and clinical history of rheumatoid arthritis suggest possible large granular lymphocytosis.  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  -further molecular testing showed T-cell garma gene rearrangement positive, and LGL flow  cytometry showed increased T-cell large granular lymphocyte (28.6% total cells), supporting the diagnosis of LGL -she started MTX at low dose 5mg  wekely on 12/14/2022, she is tolerating it well.  Plan to increase dose to 15 mg weekly from next dose -I have referred her to LGL leukemia expert Dr. Wallis Mart at [facility] for evaluation and management, however her appointment is in March 2024.  Malignant neoplasm of upper-outer quadrant of left breast in female, estrogen receptor positive ([place]) -diagnosed in 08/2020. 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 Oncotype RS 12, low risk. S/p adjuvant radiation. -she began Tamoxifen 12/2020, tolerating well overall. Held since 08/18/22 for work up of leukopenia -she restarted tamoxifen in 11/2022     PLAN: - Print off a calendar of MTX for pt to help with medication intake. -lab reviewed, LFT is stable, I called in methotrexate 15 mg weekly to her pharmacy -f/u in 6 weeks  SUMMARY OF ONCOLOGIC HISTORY: Oncology History Overview Note  Cancer Staging Malignant neoplasm of upper-outer quadrant of left breast in female, estrogen receptor positive ([place]) Staging form: Breast, AJCC 8th Edition - Clinical stage from 08/29/2020: Stage IA (cT1c, cN0, cM0, G1, ER+, PR+, HER2-) - Unsigned - Pathologic stage from 10/31/2020: Stage IA (pT2, pN0(sn), cM0, G1, ER+, PR+, HER2-, Oncotype DX score: 12) - Unsigned    Malignant neoplasm of upper-outer quadrant of left breast in female, estrogen receptor positive ([place])  08/02/2020 Mammogram   IMPRESSION The 1cm (0.9x1.2x0.9cm on US)asymmetry in the left breast at 1:00 position, 4 cmfn is indeterminate   08/22/2020 Initial Biopsy   Diagnosis 08/22/20 Breast, left, needle core biopsy, 1 o'clock, 4cmfn -INVASIVE MAMMARY CARCINOMA -SEE  COMMENT       -Grade 1 or 3  E-cadherin is NEGATIVE supporting lobular origin   08/22/2020 Receptors her2    ER - 95%  PR - 25%  Ki67 - 10% HER2 Negative on [facility]   08/24/2020 Initial Diagnosis   Malignant neoplasm of upper-outer quadrant of left breast in female, estrogen receptor positive ([place])   08/29/2020 Breast MRI   IMPRESSION: 1. Enhancing mass in the upper slightly outer left breast measuring approximately 2.4 x 1.6 x 1.8 cm, consistent with the patient's known malignancy. 2. No MRI evidence of malignancy in the right breast. 3. No suspicious lymphadenopathy.   10/05/2020 Surgery   LEFT BREAST LUMPECTOMY WITH RADIOACTIVE SEED AND SENTINEL LYMPH NODE BIOPSY by Dr Marlou Starks    10/05/2020 Pathology Results   FINAL MICROSCOPIC DIAGNOSIS:   A. LYMPH NODE, LEFT AXILLARY #1, SENTINEL, EXCISION:  -  No carcinoma identified in one lymph node (0/1)  -  See comment   B. LYMPH NODE, LEFT AXILLARY, SENTINEL, EXCISION:  -  No carcinoma identified in one lymph node (0/1)  -  See comment   C. LYMPH NODE, LEFT AXILLARY #2, SENTINEL, EXCISION:  -  No carcinoma identified in one lymph node (0/1)  -  See comment   D. BREAST, LEFT, LUMPECTOMY:  -  Invasive lobular carcinoma, Nottingham grade 1 of 3, 2.3 cm  -  Margins uninvolved by carcinoma (<0.1 cm; superior margin)  -  Previous biopsy site changes present  -  See oncology table and comment below    10/05/2020 Oncotype testing   Oncoytpe  Recurrence Score 12 with distant recurrence risk at 9 years with AI or Tamoxifen at 3%  There is less than 1% benefit of chemotherapy.    11/19/2020 - 12/14/2020 Radiation Therapy   Adjuvant Radiation with Dr Lisbeth Renshaw    12/2020 -  Anti-estrogen oral therapy   Tamoxifen 50m daily starting 12/2020    03/13/2021 Survivorship   SCP delivered by LCira Rue NP      INTERVAL HISTORY:  Jennifer Sellers is here for a follow up of leukopenia, left breast cancer    She was last seen by me on 12/09/2022 She presents to the clinic accompanied by daughter. Pt has questions about how to take her oral Methotrexate. Pt  stated that she had a concern about her toothpaste and was scared to take it, because of the warnings. Pt denies having nausea and diarrhea, since starting the oral pill, but her teeth were starting to feel not normal. Pt state she notice when she urinate she never had a lag in between urinating, denies having pain. Pt states she has been experiencing some fatigue at the end of her day and still able to do her house chores.      All other systems were reviewed with the patient and are negative.  MEDICAL HISTORY:  Past Medical History:  Diagnosis Date   Arthritis    rheumatoid arthritis   Hypothyroidism    Leukopenia    Thyroid disease    TIA (transient ischemic attack) 2011    SURGICAL HISTORY: Past Surgical History:  Procedure Laterality Date   ABDOMINAL HYSTERECTOMY     BREAST LUMPECTOMY WITH RADIOACTIVE SEED AND SENTINEL LYMPH NODE BIOPSY Left 10/05/2020   Procedure: LEFT BREAST LUMPECTOMY WITH RADIOACTIVE SEED AND SENTINEL LYMPH NODE BIOPSY;  Surgeon: TJovita Kussmaul MD;  Location: [facility]  Service: General;  Laterality: Left;  GENERAL AND PECTORAL BLOCK   CARPAL  TUNNEL RELEASE     COLONOSCOPY     COLONOSCOPY WITH PROPOFOL N/A 06/30/2018   Procedure: COLONOSCOPY WITH PROPOFOL;  Surgeon: Manya Silvas, MD;  Location: [facility] ENDOSCOPY;  Service: Endoscopy;  Laterality: N/A;   colpo-ureteroscopy     lens surgery      I have reviewed the social history and family history with the patient and they are unchanged from previous note.  ALLERGIES:  is allergic to misc. sulfonamide containing compounds, cipro [ciprofloxacin hcl], cortisporin [bacitra-neomycin-polymyxin-hc], prevnar [pneumococcal 13-val conj vacc], sulfa antibiotics, and sulfonamide derivatives.  MEDICATIONS:  Current Outpatient Medications  Medication Sig Dispense Refill   methotrexate (RHEUMATREX) 15 MG tablet Take 1 tablet (15 mg total) by mouth once a week. Caution: Chemotherapy. Protect from  light. 6 tablet 0   Biotin 1 MG CAPS      Cholecalciferol (OPTIMAL-D PO) Take 1 tablet by mouth daily.     Cholecalciferol 125 MCG (5000 UT) TABS      fluorometholone (FML) 0.1 % ophthalmic ointment      folic acid (FOLVITE) 1 MG tablet Take 1 tablet (1 mg total) by mouth daily. 30 tablet 2   levothyroxine (SYNTHROID) 75 MCG tablet Take 1 tablet (75 mcg total) by mouth daily. 90 tablet 3   loratadine (CLARITIN) 10 MG tablet Take 1 tablet (10 mg total) by mouth daily as needed for allergies. 90 tablet 3   Multiple Vitamins-Minerals (PRESERVISION AREDS 2 PO) Take 2 capsules by mouth daily.      NON FORMULARY 2 drops daily. sustane     OVER THE COUNTER MEDICATION Optimal longevi D K2     OVER THE COUNTER MEDICATION Liqua A     Prenatal Multivit w/ Iron-FA (NEONATAL FE PO) Take by mouth.     tamoxifen (NOLVADEX) 20 MG tablet Take 1 tablet (20 mg total) by mouth daily. 90 tablet 1   triamcinolone ointment (KENALOG) 0.1 %      No current facility-administered medications for this visit.    PHYSICAL EXAMINATION: ECOG PERFORMANCE STATUS: 1 - Symptomatic but completely ambulatory  Vitals:   01/07/23 0900  BP: 116/76  Pulse: 75  Resp: 17  Temp: 97.8 F (36.6 C)  SpO2: 100%   Wt Readings from Last 3 Encounters:  01/07/23 135 lb 8 oz (61.5 kg)  12/09/22 131 lb 1.6 oz (59.5 kg)  11/07/22 134 lb 6 oz (61 kg)     GENERAL:alert, no distress and comfortable SKIN: skin color normal, no rashes or significant lesions EYES: normal, Conjunctiva are pink and non-injected, sclera clear  NEURO: alert & oriented x 3 with fluent speech   LABORATORY DATA:  I have reviewed the data as listed    Latest Ref Rng & Units 01/07/2023    8:46 AM 12/23/2022    9:01 AM 12/09/2022   10:56 AM  CBC  WBC 4.0 - 10.5 K/uL 1.7  1.1  2.5   Hemoglobin 12.0 - 15.0 g/dL 11.8  12.4  13.2   Hematocrit 36.0 - 46.0 % 34.7  35.7  38.7   Platelets 150 - 400 K/uL 141  129  198         Latest Ref Rng & Units 01/07/2023     8:46 AM 12/23/2022    9:01 AM 12/09/2022   10:56 AM  CMP  Glucose 70 - 99 mg/dL 89  84  85   BUN 8 - 23 mg/dL 19  13  14   $ Creatinine 0.44 - 1.00 mg/dL 0.90  0.91  0.91   Sodium 135 - 145 mmol/L 141  141  139   Potassium 3.5 - 5.1 mmol/L 4.3  4.1  3.8   Chloride 98 - 111 mmol/L 108  108  105   CO2 22 - 32 mmol/L 28  28  30   $ Calcium 8.9 - 10.3 mg/dL 8.4  9.0  9.0   Total Protein 6.5 - 8.1 g/dL 6.4  6.4  6.6   Total Bilirubin 0.3 - 1.2 mg/dL 1.3  1.7  1.3   Alkaline Phos 38 - 126 U/L 82  84  87   AST 15 - 41 U/L 19  21  16   $ ALT 0 - 44 U/L 9  11  6       $ RADIOGRAPHIC STUDIES: I have personally reviewed the radiological images as listed and agreed with the findings in the report. No results found.    No orders of the defined types were placed in this encounter.  All questions were answered. The patient knows to call the clinic with any problems, questions or concerns. No barriers to learning was detected. The total time spent in the appointment was 30 minutes.     Truitt Merle, MD 01/07/2023   Felicity Coyer, CMA, am acting as scribe for Truitt Merle, MD.   I have reviewed the above documentation for accuracy and completeness, and I agree with the above.

## 2023-01-06 NOTE — Assessment & Plan Note (Signed)
-  diagnosed in 08/2020. 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 Oncotype RS 12, low risk. S/p adjuvant radiation. -she began Tamoxifen 12/2020, tolerating well overall. Held since 08/18/22 for work up of leukopenia -she restarted tamoxifen in 11/2022

## 2023-01-06 NOTE — Assessment & Plan Note (Signed)
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  -bone marrow biopsy on 09/25/22 showed: Hypercellular bone marrow with an atypical T-cell lymphocytosis (30% to 60%; aspirate versus flow cytometry) consisting predominantly of CD8 positive T cells with dim CD5. Morphologically large granular lymphocytes are not detected; however, the phenotype and clinical history of rheumatoid arthritis suggest possible large granular lymphocytosis.  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  -further molecular testing showed T-cell garma gene rearrangement positive, and LGL flow cytometry showed increased T-cell large granular lymphocyte (28.6% total cells), supporting the diagnosis of LGL -she started MTX at low dose 65m wekely on 12/14/2022 -[facility] however her appointment is in March 2024.

## 2023-01-07 ENCOUNTER — Encounter: Payer: Self-pay | Admitting: Hematology

## 2023-01-07 ENCOUNTER — Inpatient Hospital Stay: Payer: Medicare Other

## 2023-01-07 ENCOUNTER — Other Ambulatory Visit: Payer: Medicare Other

## 2023-01-07 ENCOUNTER — Ambulatory Visit: Payer: Medicare Other | Admitting: Hematology

## 2023-01-07 ENCOUNTER — Inpatient Hospital Stay (HOSPITAL_BASED_OUTPATIENT_CLINIC_OR_DEPARTMENT_OTHER): Payer: Medicare Other | Admitting: Hematology

## 2023-01-07 VITALS — BP 116/76 | HR 75 | Temp 97.8°F | Resp 17 | Ht 67.0 in | Wt 135.5 lb

## 2023-01-07 DIAGNOSIS — C91Z0 Other lymphoid leukemia not having achieved remission: Secondary | ICD-10-CM

## 2023-01-07 DIAGNOSIS — C50412 Malignant neoplasm of upper-outer quadrant of left female breast: Secondary | ICD-10-CM | POA: Diagnosis not present

## 2023-01-07 DIAGNOSIS — D649 Anemia, unspecified: Secondary | ICD-10-CM

## 2023-01-07 DIAGNOSIS — Z170 Estrogen receptor positive status [ER+]: Secondary | ICD-10-CM | POA: Diagnosis not present

## 2023-01-07 LAB — CBC WITH DIFFERENTIAL (CANCER CENTER ONLY)
Abs Immature Granulocytes: 0 10*3/uL (ref 0.00–0.07)
Basophils Absolute: 0 10*3/uL (ref 0.0–0.1)
Basophils Relative: 2 %
Eosinophils Absolute: 0 10*3/uL (ref 0.0–0.5)
Eosinophils Relative: 1 %
HCT: 34.7 % — ABNORMAL LOW (ref 36.0–46.0)
Hemoglobin: 11.8 g/dL — ABNORMAL LOW (ref 12.0–15.0)
Immature Granulocytes: 0 %
Lymphocytes Relative: 59 %
Lymphs Abs: 1 10*3/uL (ref 0.7–4.0)
MCH: 31.6 pg (ref 26.0–34.0)
MCHC: 34 g/dL (ref 30.0–36.0)
MCV: 92.8 fL (ref 80.0–100.0)
Monocytes Absolute: 0.1 10*3/uL (ref 0.1–1.0)
Monocytes Relative: 7 %
Neutro Abs: 0.5 10*3/uL — ABNORMAL LOW (ref 1.7–7.7)
Neutrophils Relative %: 31 %
Platelet Count: 141 10*3/uL — ABNORMAL LOW (ref 150–400)
RBC: 3.74 MIL/uL — ABNORMAL LOW (ref 3.87–5.11)
RDW: 15.4 % (ref 11.5–15.5)
WBC Count: 1.7 10*3/uL — ABNORMAL LOW (ref 4.0–10.5)
nRBC: 0 % (ref 0.0–0.2)

## 2023-01-07 LAB — CMP (CANCER CENTER ONLY)
ALT: 9 U/L (ref 0–44)
AST: 19 U/L (ref 15–41)
Albumin: 3.7 g/dL (ref 3.5–5.0)
Alkaline Phosphatase: 82 U/L (ref 38–126)
Anion gap: 5 (ref 5–15)
BUN: 19 mg/dL (ref 8–23)
CO2: 28 mmol/L (ref 22–32)
Calcium: 8.4 mg/dL — ABNORMAL LOW (ref 8.9–10.3)
Chloride: 108 mmol/L (ref 98–111)
Creatinine: 0.9 mg/dL (ref 0.44–1.00)
GFR, Estimated: >60 mL/min (ref >60)
Glucose, Bld: 89 mg/dL (ref 70–99)
Potassium: 4.3 mmol/L (ref 3.5–5.1)
Sodium: 141 mmol/L (ref 135–145)
Total Bilirubin: 1.3 mg/dL — ABNORMAL HIGH (ref 0.3–1.2)
Total Protein: 6.4 g/dL — ABNORMAL LOW (ref 6.5–8.1)

## 2023-01-07 LAB — FERRITIN: Ferritin: 38 ng/mL (ref 11–307)

## 2023-01-07 MED ORDER — METHOTREXATE SODIUM 15 MG PO TABS: 15.0000 mg | ORAL_TABLET | ORAL | 0 refills | Status: DC

## 2023-01-07 NOTE — Progress Notes (Signed)
Methotrexate medication calendar given to pt per Dr. Ernestina Penna request.  Pt is to take methotrexate 19m (1tab) once a week on Sundays starting with 01/11/2023.

## 2023-02-10 DIAGNOSIS — C91Z0 Other lymphoid leukemia not having achieved remission: Secondary | ICD-10-CM | POA: Insufficient documentation

## 2023-02-17 NOTE — Assessment & Plan Note (Signed)
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  -bone marrow biopsy on 09/25/22 showed: Hypercellular bone marrow with an atypical T-cell lymphocytosis (30% to 60%; aspirate versus flow cytometry) consisting predominantly of CD8 positive T cells with dim CD5. Morphologically large granular lymphocytes are not detected; however, the phenotype and clinical history of rheumatoid arthritis suggest possible large granular lymphocytosis.  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  -further molecular testing showed T-cell garma gene rearrangement positive, and LGL flow cytometry showed increased T-cell large granular lymphocyte (28.6% total cells), supporting the diagnosis of LGL -she started MTX at low dose 5mg  wekely on 12/14/2022, she is tolerating it well.  Plan to increase dose to 50 mg weekly from next dose -I have referred her to LGL leukemia expert Dr. Wallis Mart at [facility] and she was seen on 02/10/23. Her diagnose was confirmed and Dr. Wallis Mart also recommend MTX with full dose 17.5mg  (10mg /m2) weekly.

## 2023-02-17 NOTE — Progress Notes (Unsigned)
Jennifer Sellers   [phone] [phone]   Clinic Follow up Note   Patient Care Team: Tower, Wynelle Fanny, MD as PCP - General Lad, Caesar Bookman, MD as Referring Physician (Ophthalmology) Birder Robson, MD as Referring Physician (Ophthalmology) Worthy Keeler, DC as Consulting Physician (Chiropractic Medicine) Mauro Kaufmann, RN as Oncology Nurse Navigator Rockwell Germany, RN as Oncology Nurse Navigator Jovita Kussmaul, MD as Consulting Physician (General Surgery) Truitt Merle, MD as Consulting Physician (Hematology) Kyung Rudd, MD as Consulting Physician (Radiation Oncology) Alla Feeling, NP as Nurse Practitioner (Nurse Practitioner) Wallis Mart Anola Gurney., MD as Consulting Physician (Hematology and Oncology)  Date of Service:  02/18/2023  CHIEF COMPLAINT: f/u of  leukopenia, left breast cancer   CURRENT THERAPY:   MTX with full dose 17.5mg  (10mg /m2) weekly.  One 15 mg  tablet , one 2.5 mg tablet  and take together once a week.    ASSESSMENT:  Jennifer Sellers is a 77 y.o. 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  -bone marrow biopsy on 09/25/22 showed: Hypercellular bone marrow with an atypical T-cell lymphocytosis (30% to 60%; aspirate versus flow cytometry) consisting predominantly of CD8 positive T cells with dim CD5. Morphologically large granular lymphocytes are not detected; however, the phenotype and clinical history of rheumatoid arthritis suggest possible large granular lymphocytosis.  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  -further molecular testing showed T-cell garma gene rearrangement positive, and LGL  flow cytometry showed increased T-cell large granular lymphocyte (28.6% total cells), supporting the diagnosis of LGL -she started MTX at low dose 5mg  wekely on 12/14/2022, she is tolerating it well.  I increase dose to 15 mg weekly. -I have referred her to LGL leukemia expert Dr. Wallis Mart at [facility] and she was seen on 02/10/23. Her diagnose was confirmed and Dr. Wallis Mart also recommend MTX with full dose 17.5mg  (10mg /m2) weekly.   Malignant neoplasm of upper-outer quadrant of left breast in female, estrogen receptor positive ([place]) -diagnosed in 08/2020. 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 Oncotype RS 12, low risk. S/p adjuvant radiation. -she began Tamoxifen 12/2020, tolerating well overall. Held since 08/18/22 for work up of leukopenia -I recommended her to restart tamoxifen in 11/2022 but she has not restarted yet, she is agreeable to restart now.  Dry cough and urinary frequency -Her symptom has been going on for the past 2 to 3 months, overall improving daily -Will monitor for now, she knows to call me or her primary care physician if symptoms get worse and she may need antibiotics.   PLAN: -lab reviewed, ANC 0.7 today  --recommend mouth wash for pt to help with healing of the mouth soar. - I refill MTX full dose 17.5 mg weekly (15mg  +2.5mg  pills) -lab in 1 month for close monitoring -Lab and follow-up in 3 months   SUMMARY OF ONCOLOGIC HISTORY: Oncology History Overview Note  Cancer Staging Malignant neoplasm of upper-outer quadrant of left breast in female, estrogen receptor positive ([place]) Staging form: Breast, AJCC 8th Edition - Clinical stage from 08/29/2020: Stage IA (cT1c, cN0, cM0, G1, ER+,  PR+, HER2-) - Unsigned - Pathologic stage from 10/31/2020: Stage IA (pT2, pN0(sn), cM0, G1, ER+, PR+, HER2-, Oncotype DX score: 12) - Unsigned    Malignant neoplasm of upper-outer quadrant of left  breast in female, estrogen receptor positive  08/02/2020 Mammogram   IMPRESSION The 1cm (0.9x1.2x0.9cm on US)asymmetry in the left breast at 1:00 position, 4 cmfn is indeterminate   08/22/2020 Initial Biopsy   Diagnosis 08/22/20 Breast, left, needle core biopsy, 1 o'clock, 4cmfn -INVASIVE MAMMARY CARCINOMA -SEE COMMENT       -Grade 1 or 3  E-cadherin is NEGATIVE supporting lobular origin   08/22/2020 Receptors her2   ER - 95%  PR - 25%  Ki67 - 10% HER2 Negative on [facility]   08/24/2020 Initial Diagnosis   Malignant neoplasm of upper-outer quadrant of left breast in female, estrogen receptor positive ([place])   08/29/2020 Breast MRI   IMPRESSION: 1. Enhancing mass in the upper slightly outer left breast measuring approximately 2.4 x 1.6 x 1.8 cm, consistent with the patient's known malignancy. 2. No MRI evidence of malignancy in the right breast. 3. No suspicious lymphadenopathy.   10/05/2020 Surgery   LEFT BREAST LUMPECTOMY WITH RADIOACTIVE SEED AND SENTINEL LYMPH NODE BIOPSY by Dr Marlou Starks    10/05/2020 Pathology Results   FINAL MICROSCOPIC DIAGNOSIS:   A. LYMPH NODE, LEFT AXILLARY #1, SENTINEL, EXCISION:  -  No carcinoma identified in one lymph node (0/1)  -  See comment   B. LYMPH NODE, LEFT AXILLARY, SENTINEL, EXCISION:  -  No carcinoma identified in one lymph node (0/1)  -  See comment   C. LYMPH NODE, LEFT AXILLARY #2, SENTINEL, EXCISION:  -  No carcinoma identified in one lymph node (0/1)  -  See comment   D. BREAST, LEFT, LUMPECTOMY:  -  Invasive lobular carcinoma, Nottingham grade 1 of 3, 2.3 cm  -  Margins uninvolved by carcinoma (<0.1 cm; superior margin)  -  Previous biopsy site changes present  -  See oncology table and comment below    10/05/2020 Oncotype testing   Oncoytpe  Recurrence Score 12 with distant recurrence risk at 9 years with AI or Tamoxifen at 3%  There is less than 1% benefit of chemotherapy.    11/19/2020 - 12/14/2020 Radiation Therapy    Adjuvant Radiation with Dr Lisbeth Renshaw    12/2020 -  Anti-estrogen oral therapy   Tamoxifen 20mg  daily starting 12/2020    03/13/2021 Survivorship   SCP delivered by Cira Rue, NP      INTERVAL HISTORY:  Jennifer Sellers is here for a follow up of  leukopenia, left breast cancer  She was last seen by me on 01/07/2023 She presents to the clinic accompanied by family. Pt state she had no problems after the increase dose of Methotrexate.Pt state that she had being seeing black spots and she had an upcoming appointment with the optometrist. Pt state that she has had a cough for about 3-6 months.      All other systems were reviewed with the patient and are negative.  MEDICAL HISTORY:  Past Medical History:  Diagnosis Date   Arthritis    rheumatoid arthritis   Hypothyroidism    Leukopenia    Thyroid disease    TIA (transient ischemic attack) 2011    SURGICAL HISTORY: Past Surgical History:  Procedure Laterality Date   ABDOMINAL HYSTERECTOMY     BREAST LUMPECTOMY WITH RADIOACTIVE SEED AND SENTINEL LYMPH NODE BIOPSY Left 10/05/2020   Procedure: LEFT BREAST LUMPECTOMY WITH  RADIOACTIVE SEED AND SENTINEL LYMPH NODE BIOPSY;  Surgeon: Jovita Kussmaul, MD;  Location: [facility];  Service: General;  Laterality: Left;  GENERAL AND PECTORAL BLOCK   CARPAL TUNNEL RELEASE     COLONOSCOPY     COLONOSCOPY WITH PROPOFOL N/A 06/30/2018   Procedure: COLONOSCOPY WITH PROPOFOL;  Surgeon: Manya Silvas, MD;  Location: [facility] ENDOSCOPY;  Service: Endoscopy;  Laterality: N/A;   colpo-ureteroscopy     lens surgery      I have reviewed the social history and family history with the patient and they are unchanged from previous note.  ALLERGIES:  is allergic to misc. sulfonamide containing compounds, cipro [ciprofloxacin hcl], cortisporin [bacitra-neomycin-polymyxin-hc], prevnar [pneumococcal 13-val conj vacc], sulfa antibiotics, and sulfonamide derivatives.  MEDICATIONS:  Current Outpatient  Medications  Medication Sig Dispense Refill   methotrexate (RHEUMATREX) 2.5 MG tablet Take 1 tablet (2.5 mg total) by mouth once a week. Take with 15mg  tab, once a week  Caution:Chemotherapy. Protect from light. 8 tablet 1   Biotin 1 MG CAPS      Cholecalciferol (OPTIMAL-D PO) Take 1 tablet by mouth daily.     Cholecalciferol 125 MCG (5000 UT) TABS      fluorometholone (FML) 0.1 % ophthalmic ointment      folic acid (FOLVITE) 1 MG tablet Take 1 tablet (1 mg total) by mouth daily. 30 tablet 2   levothyroxine (SYNTHROID) 75 MCG tablet Take 1 tablet (75 mcg total) by mouth daily. 90 tablet 3   methotrexate (RHEUMATREX) 15 MG tablet Take 1 tablet (15 mg total) by mouth once a week. Caution: Chemotherapy. Protect from light. 8 tablet 1   Multiple Vitamins-Minerals (PRESERVISION AREDS 2 PO) Take 2 capsules by mouth daily.      NON FORMULARY 2 drops daily. sustane     OVER THE COUNTER MEDICATION Optimal longevi D K2     OVER THE COUNTER MEDICATION Liqua A     Prenatal Multivit w/ Iron-FA (NEONATAL FE PO) Take by mouth.     tamoxifen (NOLVADEX) 20 MG tablet Take 1 tablet (20 mg total) by mouth daily. 90 tablet 1   triamcinolone ointment (KENALOG) 0.1 %      No current facility-administered medications for this visit.    PHYSICAL EXAMINATION: ECOG PERFORMANCE STATUS: 1 - Symptomatic but completely ambulatory  Vitals:   02/18/23 0952  BP: 118/76  Pulse: 71  Resp: 15  Temp: 98.1 F (36.7 C)  SpO2: 100%   Wt Readings from Last 3 Encounters:  02/18/23 136 lb 6 oz (61.9 kg)  02/18/23 135 lb 6.4 oz (61.4 kg)  01/07/23 135 lb 8 oz (61.5 kg)     GENERAL:alert, no distress and comfortable SKIN: skin color normal, no rashes or significant lesions EYES: normal, Conjunctiva are pink and non-injected, sclera clear  NEURO: alert & oriented x 3 with fluent speech  LABORATORY DATA:  I have reviewed the data as listed    Latest Ref Rng & Units 02/18/2023    9:31 AM 01/07/2023    8:46 AM 12/23/2022     9:01 AM  CBC  WBC 4.0 - 10.5 K/uL 1.7  1.7  1.1   Hemoglobin 12.0 - 15.0 g/dL 11.3  11.8  12.4   Hematocrit 36.0 - 46.0 % 33.0  34.7  35.7   Platelets 150 - 400 K/uL 169  141  129         Latest Ref Rng & Units 02/18/2023    9:31 AM 01/07/2023  8:46 AM 12/23/2022    9:01 AM  CMP  Glucose 70 - 99 mg/dL 59  89  84   BUN 8 - 23 mg/dL 15  19  13    Creatinine 0.44 - 1.00 mg/dL 0.96  0.90  0.91   Sodium 135 - 145 mmol/L 142  141  141   Potassium 3.5 - 5.1 mmol/L 4.0  4.3  4.1   Chloride 98 - 111 mmol/L 109  108  108   CO2 22 - 32 mmol/L 27  28  28    Calcium 8.9 - 10.3 mg/dL 9.1  8.4  9.0   Total Protein 6.5 - 8.1 g/dL 6.6  6.4  6.4   Total Bilirubin 0.3 - 1.2 mg/dL 1.4  1.3  1.7   Alkaline Phos 38 - 126 U/L 76  82  84   AST 15 - 41 U/L 16  19  21    ALT 0 - 44 U/L 8  9  11        RADIOGRAPHIC STUDIES: I have personally reviewed the radiological images as listed and agreed with the findings in the report. No results found.    No orders of the defined types were placed in this encounter.  All questions were answered. The patient knows to call the clinic with any problems, questions or concerns. No barriers to learning was detected. The total time spent in the appointment was 30 minutes.     Truitt Merle, MD 02/18/2023   Felicity Coyer, CMA, am acting as scribe for Truitt Merle, MD.   I have reviewed the above documentation for accuracy and completeness, and I agree with the above.

## 2023-02-17 NOTE — Assessment & Plan Note (Signed)
-  diagnosed in 08/2020. S/p left lumpectomy with SLNB by Dr Toth on 10/05/20, pathology showed 2.3 cm lobular carcinoma with clean margins, node negative. Oncotype RS 12, low risk. S/p adjuvant radiation. -she began Tamoxifen 12/2020, tolerating well overall. Held since 08/18/22 for work up of leukopenia -she restarted tamoxifen in 11/2022   

## 2023-02-18 ENCOUNTER — Encounter: Payer: Self-pay | Admitting: Family Medicine

## 2023-02-18 ENCOUNTER — Other Ambulatory Visit: Payer: Self-pay

## 2023-02-18 ENCOUNTER — Inpatient Hospital Stay: Payer: Medicare Other | Attending: Hematology

## 2023-02-18 ENCOUNTER — Ambulatory Visit (INDEPENDENT_AMBULATORY_CARE_PROVIDER_SITE_OTHER): Payer: Medicare Other | Admitting: Family Medicine

## 2023-02-18 ENCOUNTER — Inpatient Hospital Stay (HOSPITAL_BASED_OUTPATIENT_CLINIC_OR_DEPARTMENT_OTHER): Payer: Medicare Other | Admitting: Hematology

## 2023-02-18 ENCOUNTER — Telehealth: Payer: Self-pay | Admitting: *Deleted

## 2023-02-18 ENCOUNTER — Encounter: Payer: Self-pay | Admitting: Hematology

## 2023-02-18 VITALS — BP 118/76 | HR 71 | Temp 98.1°F | Resp 15 | Wt 135.4 lb

## 2023-02-18 VITALS — BP 126/62 | HR 75 | Temp 97.5°F | Ht 66.5 in | Wt 136.4 lb

## 2023-02-18 DIAGNOSIS — R413 Other amnesia: Secondary | ICD-10-CM | POA: Insufficient documentation

## 2023-02-18 DIAGNOSIS — E559 Vitamin D deficiency, unspecified: Secondary | ICD-10-CM

## 2023-02-18 DIAGNOSIS — C50412 Malignant neoplasm of upper-outer quadrant of left female breast: Secondary | ICD-10-CM

## 2023-02-18 DIAGNOSIS — C91Z0 Other lymphoid leukemia not having achieved remission: Secondary | ICD-10-CM

## 2023-02-18 DIAGNOSIS — R059 Cough, unspecified: Secondary | ICD-10-CM | POA: Diagnosis not present

## 2023-02-18 DIAGNOSIS — Z90710 Acquired absence of both cervix and uterus: Secondary | ICD-10-CM | POA: Diagnosis not present

## 2023-02-18 DIAGNOSIS — Z170 Estrogen receptor positive status [ER+]: Secondary | ICD-10-CM | POA: Diagnosis not present

## 2023-02-18 DIAGNOSIS — F4321 Adjustment disorder with depressed mood: Secondary | ICD-10-CM | POA: Insufficient documentation

## 2023-02-18 DIAGNOSIS — D649 Anemia, unspecified: Secondary | ICD-10-CM

## 2023-02-18 DIAGNOSIS — D72819 Decreased white blood cell count, unspecified: Secondary | ICD-10-CM | POA: Insufficient documentation

## 2023-02-18 DIAGNOSIS — R350 Frequency of micturition: Secondary | ICD-10-CM | POA: Insufficient documentation

## 2023-02-18 DIAGNOSIS — R5382 Chronic fatigue, unspecified: Secondary | ICD-10-CM | POA: Diagnosis not present

## 2023-02-18 DIAGNOSIS — R4189 Other symptoms and signs involving cognitive functions and awareness: Secondary | ICD-10-CM | POA: Diagnosis not present

## 2023-02-18 DIAGNOSIS — R5383 Other fatigue: Secondary | ICD-10-CM | POA: Insufficient documentation

## 2023-02-18 DIAGNOSIS — Z79810 Long term (current) use of selective estrogen receptor modulators (SERMs): Secondary | ICD-10-CM | POA: Insufficient documentation

## 2023-02-18 DIAGNOSIS — H539 Unspecified visual disturbance: Secondary | ICD-10-CM | POA: Diagnosis not present

## 2023-02-18 DIAGNOSIS — E039 Hypothyroidism, unspecified: Secondary | ICD-10-CM

## 2023-02-18 LAB — CMP (CANCER CENTER ONLY)
ALT: 8 U/L (ref 0–44)
AST: 16 U/L (ref 15–41)
Albumin: 3.8 g/dL (ref 3.5–5.0)
Alkaline Phosphatase: 76 U/L (ref 38–126)
Anion gap: 6 (ref 5–15)
BUN: 15 mg/dL (ref 8–23)
CO2: 27 mmol/L (ref 22–32)
Calcium: 9.1 mg/dL (ref 8.9–10.3)
Chloride: 109 mmol/L (ref 98–111)
Creatinine: 0.96 mg/dL (ref 0.44–1.00)
GFR, Estimated: >60 mL/min (ref >60)
Glucose, Bld: 59 mg/dL — ABNORMAL LOW (ref 70–99)
Potassium: 4 mmol/L (ref 3.5–5.1)
Sodium: 142 mmol/L (ref 135–145)
Total Bilirubin: 1.4 mg/dL — ABNORMAL HIGH (ref 0.3–1.2)
Total Protein: 6.6 g/dL (ref 6.5–8.1)

## 2023-02-18 LAB — COMPREHENSIVE METABOLIC PANEL
ALT: 8 U/L (ref 0–35)
AST: 17 U/L (ref 0–37)
Albumin: 3.8 g/dL (ref 3.5–5.2)
Alkaline Phosphatase: 81 U/L (ref 39–117)
BUN: 14 mg/dL (ref 6–23)
CO2: 29 mEq/L (ref 19–32)
Calcium: 8.7 mg/dL (ref 8.4–10.5)
Chloride: 106 mEq/L (ref 96–112)
Creatinine, Ser: 0.98 mg/dL (ref 0.40–1.20)
GFR: 55.1 mL/min — ABNORMAL LOW (ref >60.00)
Glucose, Bld: 79 mg/dL (ref 70–99)
Potassium: 4.2 mEq/L (ref 3.5–5.1)
Sodium: 140 mEq/L (ref 135–145)
Total Bilirubin: 1.4 mg/dL — ABNORMAL HIGH (ref 0.2–1.2)
Total Protein: 6.1 g/dL (ref 6.0–8.3)

## 2023-02-18 LAB — CBC WITH DIFFERENTIAL (CANCER CENTER ONLY)
Abs Immature Granulocytes: 0.01 10*3/uL (ref 0.00–0.07)
Basophils Absolute: 0 10*3/uL (ref 0.0–0.1)
Basophils Relative: 1 %
Eosinophils Absolute: 0 10*3/uL (ref 0.0–0.5)
Eosinophils Relative: 1 %
HCT: 33 % — ABNORMAL LOW (ref 36.0–46.0)
Hemoglobin: 11.3 g/dL — ABNORMAL LOW (ref 12.0–15.0)
Immature Granulocytes: 1 %
Lymphocytes Relative: 49 %
Lymphs Abs: 0.8 10*3/uL (ref 0.7–4.0)
MCH: 32.1 pg (ref 26.0–34.0)
MCHC: 34.2 g/dL (ref 30.0–36.0)
MCV: 93.8 fL (ref 80.0–100.0)
Monocytes Absolute: 0.1 10*3/uL (ref 0.1–1.0)
Monocytes Relative: 7 %
Neutro Abs: 0.7 10*3/uL — ABNORMAL LOW (ref 1.7–7.7)
Neutrophils Relative %: 41 %
Platelet Count: 169 10*3/uL (ref 150–400)
RBC: 3.52 MIL/uL — ABNORMAL LOW (ref 3.87–5.11)
RDW: 15.1 % (ref 11.5–15.5)
WBC Count: 1.7 10*3/uL — ABNORMAL LOW (ref 4.0–10.5)
nRBC: 0 % (ref 0.0–0.2)

## 2023-02-18 LAB — POC URINALSYSI DIPSTICK (AUTOMATED)
Bilirubin, UA: NEGATIVE
Blood, UA: NEGATIVE
Glucose, UA: NEGATIVE
Ketones, UA: NEGATIVE
Leukocytes, UA: NEGATIVE
Nitrite, UA: NEGATIVE
Protein, UA: NEGATIVE
Spec Grav, UA: <=1.005 (ref 1.010–1.025)
Urobilinogen, UA: 0.2 E.U./dL
pH, UA: 6 (ref 5.0–8.0)

## 2023-02-18 LAB — FERRITIN: Ferritin: 48 ng/mL (ref 11–307)

## 2023-02-18 LAB — VITAMIN D 25 HYDROXY (VIT D DEFICIENCY, FRACTURES): VITD: 21.76 ng/mL — ABNORMAL LOW (ref 30.00–100.00)

## 2023-02-18 LAB — TSH: TSH: 2.49 u[IU]/mL (ref 0.35–5.50)

## 2023-02-18 LAB — T4, FREE: Free T4: 1.13 ng/dL (ref 0.60–1.60)

## 2023-02-18 MED ORDER — METHOTREXATE SODIUM 15 MG PO TABS: 15.0000 mg | ORAL_TABLET | ORAL | 1 refills | Status: DC

## 2023-02-18 MED ORDER — METHOTREXATE SODIUM 2.5 MG PO TABS: 2.5000 mg | ORAL_TABLET | ORAL | 1 refills | Status: DC

## 2023-02-18 NOTE — Patient Instructions (Addendum)
Take the preservision every day   Get appt with eye doctor   Aim for at leas 60 oz of fluids daily  Mostly water   Urinalysis today and some labs   Follow up in 2 weeks for a mini memory test    Go back for some more counseling with [facility]

## 2023-02-18 NOTE — Assessment & Plan Note (Signed)
Fatigue with some cognitive slow down in setting of big life changes/ medically and socially  Lab today F/u planned to review No change on exam

## 2023-02-18 NOTE — Assessment & Plan Note (Signed)
In light of recent fatigue and memory change TSH and FT4 ordered  On levothyrox 75 mcg daily  No change in exam

## 2023-02-18 NOTE — Assessment & Plan Note (Signed)
Spots at times in vision  Enc her to f/u with her eye care provider Inst to take preservision daily inst of prn  ? What her past dx was   ER precautions noted

## 2023-02-18 NOTE — Assessment & Plan Note (Signed)
Ua is clear today  May be overactive bladder

## 2023-02-18 NOTE — Assessment & Plan Note (Signed)
Suspect multifactorial Mostly short term memory/word retrieval  Mild dec in exec fxn  Discussed triggers including  Grief  Life change  New medical problems incl leukemia Medications  Stress/anxiety   Reassuring exam Labs ordered Will f/u for visit to review and do MMSE Helpful daughter from [place] was here as well

## 2023-02-18 NOTE — Assessment & Plan Note (Signed)
After losing husband Some mood and cognitive change  Enc her to return to grief counseling with [facility] Also continue meeting with her group

## 2023-02-18 NOTE — Assessment & Plan Note (Signed)
Under care of onc Last note and labs noted Also working with a sub specialist at [facility]  On mtx for this and also RA

## 2023-02-18 NOTE — Telephone Encounter (Signed)
Pt.notified

## 2023-02-18 NOTE — Telephone Encounter (Signed)
-----   Message from Abner Greenspan, MD sent at 02/18/2023  1:08 PM EDT ----- Negative urinalysis  This is reassuring  Labs are pending

## 2023-02-18 NOTE — Progress Notes (Signed)
Subjective:    Patient ID: Jennifer Sellers, female    DOB: [birth year], 77 y.o.   MRN: [id number]  HPI Pt presents for memory concerns  Eye problem Update from oncology     Wt Readings from Last 3 Encounters:  02/18/23 136 lb 6 oz (61.9 kg)  02/18/23 135 lb 6.4 oz (61.4 kg)  01/07/23 135 lb 8 oz (61.5 kg)   21.68 kg/m  Vitals:   02/18/23 1153  BP: 126/62  Pulse: 75  Temp: (!) 97.5 F (36.4 C)  SpO2: 100%   Daughter is here  She is close by Other daughter lives with her   Vision is getting worse recently  Saw Dr Mearl Latin at [facility] before husb passed away  Then [facility]  Now back to [facility]  Had cataract surgery- went well  No pressure issue  No dx of mac deg that she knos of   Yesterday she woke up and saw black abstract shapes in both eyes  Lasted 5-10 minutes -? Maybe more Both eyes  No headache  No h/o migraines  No lines  Occ floaters -but not in a while  On preservision - takes as needed   Memory concerns  Her memory has gotten worse At first thought - mood related/ anx related - after losing husband and getting leukemia  She talked to grief counselors/ program at hospice  Meets with her group occ for breakfast   Some short term issues - forgetting appts Names and word recall  Family needs to repeat things Has had to move to calendar and pill boxes for mediations  Delpizzo term memory - has not noticed as much   Takes longer to do paperwork and pay bills    Sometimes stays up very late trying to finish tasks   Reading comprehension - does not think problem More trouble with retaining  Has 2 papers and magazines   Family does not notice episodes of confusion  Pt is unsure   Does not get lost  No weird behaviors  No vis or aud hallucinations     Iron was a little low Lab Results  Component Value Date   IRON 69 08/18/2022   TIBC 280 08/18/2022   FERRITIN 38 01/07/2023      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  Taking methotrexate low dose 5 mg weekly for RA aksi Was ref to sub specialst at [facility] (in a registry will help with research)   H/o hypothyroid Lab Results  Component Value Date   TSH 3.64 07/25/2022   Levothy 75 mcg daily  Lab Results  Component Value Date   WBC 1.7 (L) 02/18/2023   HGB 11.3 (L) 02/18/2023   HCT 33.0 (L) 02/18/2023   MCV 93.8 02/18/2023   PLT 169 02/18/2023   Lab Results  Component Value Date   VITAMINB12 412 0000000     Takes folic acid Takes preservision  Takes tamoxifen  Mood    06/11/2022   10:33 AM 05/28/2021    1:42 PM 05/30/2020    8:33 AM 05/09/2019    9:46 AM 01/28/2018   10:30 AM  Depression screen PHQ 2/9  Decreased Interest 0 0 0 0 0  Down, Depressed, Hopeless 0 0 0 0 0  PHQ - 2 Score 0 0 0 0 0  Altered sleeping 0 0  0 0  Tired, decreased energy 0 0  0 0  Change in appetite  0 0  0 0  Feeling bad or failure about yourself  0 0  0 0  Trouble concentrating 0 0  0 0  Moving slowly or fidgety/restless 0 0  0 0  Suicidal thoughts 0 0  0 0  PHQ-9 Score 0 0  0 0  Difficult doing work/chores Not difficult at all Not difficult at all  Not difficult at all Not difficult at all    Tai chi for exercis Lots of housework   She has some issues emptying bladder occ  Only drinks water and coffee  Results for orders placed or performed in visit on 02/18/23  POCT Urinalysis Dipstick (Automated)  Result Value Ref Range   Color, UA Yellow    Clarity, UA Clear    Glucose, UA Negative Negative   Bilirubin, UA Negative    Ketones, UA Negative    Spec Grav, UA <=1.005 1.010 - 1.025   Blood, UA Negative    pH, UA 6.0 5.0 - 8.0   Protein, UA Negative Negative   Urobilinogen, UA 0.2 0.2 or 1.0 E.U./dL   Nitrite, UA Negative    Leukocytes, UA Negative Negative    Patient Active Problem List   Diagnosis Date Noted   Vision changes 02/18/2023   Frequent urination 02/18/2023   Cognitive decline 02/18/2023    Grief 02/18/2023   Short-term memory loss 02/18/2023   Fatigue 02/18/2023   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   Other pancytopenia 09/10/2022   Carpal joint sprain 06/10/2022   Radial styloid tenosynovitis 06/10/2022   Acquired trigger finger 06/10/2022   Malignant neoplasm of upper-outer quadrant of left breast in female, estrogen receptor positive 08/24/2020   Estrogen deficiency 05/24/2019   Hx of adenomatous colonic polyps 03/11/2018   Vitamin D deficiency 08/03/2015   Routine general medical examination at a health care facility 05/15/2015   Abnormal ankle brachial index 08/23/2014   Rheumatoid arthritis 06/19/2014   Thrombocytopenia 06/19/2014   Encounter for Medicare annual wellness exam 11/11/2013   Sachs-term use of Plaquenil 10/17/2013   Hyperbilirubinemia 08/19/2013   Hypothyroid 09/21/2012   NEOPLASMS UNSPEC NATURE BONE SOFT TISSUE&SKIN 10/23/2010   GOUTY TOPHI 10/23/2010   OTHER SYMPTOMS REFERABLE JOINT SITE UNSPECIFIED 10/23/2010   CARPAL TUNNEL SYNDROME, HX OF 04/05/2009   Leukocytopenia 05/02/2008   INSOMNIA, TRANSIENT 04/27/2007   Osteopenia 04/27/2007   Hyperlipidemia 04/23/2007   HIATAL HERNIA 04/23/2007   POSTMENOPAUSAL STATUS 04/23/2007   RHEUMATOID FACTOR, POSITIVE 04/23/2007   Past Medical History:  Diagnosis Date   Arthritis    rheumatoid arthritis   Hypothyroidism    Leukopenia    Thyroid disease    TIA (transient ischemic attack) 2011   Past Surgical History:  Procedure Laterality Date   ABDOMINAL HYSTERECTOMY     BREAST LUMPECTOMY WITH RADIOACTIVE SEED AND SENTINEL LYMPH NODE BIOPSY Left 10/05/2020   Procedure: LEFT BREAST LUMPECTOMY WITH RADIOACTIVE SEED AND SENTINEL LYMPH NODE BIOPSY;  Surgeon: Jovita Kussmaul, MD;  Location: [facility];  Service: General;  Laterality: Left;  GENERAL AND PECTORAL BLOCK   CARPAL TUNNEL RELEASE     COLONOSCOPY     COLONOSCOPY WITH PROPOFOL N/A 06/30/2018   Procedure: COLONOSCOPY WITH  PROPOFOL;  Surgeon: Manya Silvas, MD;  Location: [facility];  Service: Endoscopy;  Laterality: N/A;   colpo-ureteroscopy     lens surgery     Social History   Tobacco Use   Smoking status: Never   Smokeless tobacco: Never  Vaping Use  Vaping Use: Never used  Substance Use Topics   Alcohol use: Yes    Alcohol/week: 0.0 standard drinks of alcohol    Comment: occasional   Drug use: No   Family History  Problem Relation Age of Onset   Cancer Mother        lymphoma   Cancer Brother        lymphoma   Cancer Daughter        NHL   Allergies  Allergen Reactions   Misc. Sulfonamide Containing Compounds Rash    REACTION: reaction  not known REACTION: reaction not known   Cipro [Ciprofloxacin Hcl] Other (See Comments)    Unknown    Cortisporin [Bacitra-Neomycin-Polymyxin-Hc]     Burning / swelling of eyes with eye drops   Prevnar [Pneumococcal 13-Val Conj Vacc] Other (See Comments)    Severe local reaction    Sulfa Antibiotics    Sulfonamide Derivatives     REACTION: reaction not known   Current Outpatient Medications on File Prior to Visit  Medication Sig Dispense Refill   Biotin 1 MG CAPS      Cholecalciferol (OPTIMAL-D PO) Take 1 tablet by mouth daily.     Cholecalciferol 125 MCG (5000 UT) TABS      fluorometholone (FML) 0.1 % ophthalmic ointment      folic acid (FOLVITE) 1 MG tablet Take 1 tablet (1 mg total) by mouth daily. 30 tablet 2   levothyroxine (SYNTHROID) 75 MCG tablet Take 1 tablet (75 mcg total) by mouth daily. 90 tablet 3   methotrexate (RHEUMATREX) 15 MG tablet Take 1 tablet (15 mg total) by mouth once a week. Caution: Chemotherapy. Protect from light. 8 tablet 1   methotrexate (RHEUMATREX) 2.5 MG tablet Take 1 tablet (2.5 mg total) by mouth once a week. Take with 15mg  tab, once a week  Caution:Chemotherapy. Protect from light. 8 tablet 1   Multiple Vitamins-Minerals (PRESERVISION AREDS 2 PO) Take 2 capsules by mouth daily.      NON FORMULARY 2  drops daily. sustane     OVER THE COUNTER MEDICATION Optimal longevi D K2     OVER THE COUNTER MEDICATION Liqua A     Prenatal Multivit w/ Iron-FA (NEONATAL FE PO) Take by mouth.     tamoxifen (NOLVADEX) 20 MG tablet Take 1 tablet (20 mg total) by mouth daily. 90 tablet 1   triamcinolone ointment (KENALOG) 0.1 %      No current facility-administered medications on file prior to visit.    Review of Systems  Constitutional:  Positive for fatigue. Negative for activity change, appetite change, fever and unexpected weight change.  HENT:  Negative for congestion, ear pain, rhinorrhea, sinus pressure and sore throat.   Eyes:  Positive for visual disturbance. Negative for photophobia, pain, discharge, redness and itching.  Respiratory:  Negative for cough, shortness of breath and wheezing.   Cardiovascular:  Negative for chest pain and palpitations.  Gastrointestinal:  Negative for abdominal pain, blood in stool, constipation and diarrhea.  Endocrine: Negative for polydipsia and polyuria.  Genitourinary:  Negative for dysuria, frequency and urgency.  Musculoskeletal:  Negative for arthralgias, back pain and myalgias.  Skin:  Negative for pallor and rash.  Allergic/Immunologic: Negative for environmental allergies.  Neurological:  Negative for dizziness, tremors, seizures, syncope, facial asymmetry, speech difficulty, weakness, light-headedness, numbness and headaches.  Hematological:  Negative for adenopathy. Does not bruise/bleed easily.  Psychiatric/Behavioral:  Positive for decreased concentration and dysphoric mood. Negative for confusion. The patient is nervous/anxious.  Grief       Objective:   Physical Exam Constitutional:      General: She is not in acute distress.    Appearance: Normal appearance. She is well-developed and normal weight. She is not ill-appearing or diaphoretic.  HENT:     Head: Normocephalic and atraumatic.     Right Ear: Tympanic membrane, ear canal and  external ear normal.     Left Ear: Tympanic membrane, ear canal and external ear normal.     Nose: Nose normal. No congestion or rhinorrhea.     Mouth/Throat:     Pharynx: No oropharyngeal exudate.  Eyes:     General: No scleral icterus.       Right eye: No discharge.        Left eye: No discharge.     Conjunctiva/sclera: Conjunctivae normal.     Pupils: Pupils are equal, round, and reactive to light.     Comments: No nystagmus  Neck:     Thyroid: No thyromegaly.     Vascular: No carotid bruit or JVD.     Trachea: No tracheal deviation.  Cardiovascular:     Rate and Rhythm: Normal rate and regular rhythm.     Pulses: Normal pulses.     Heart sounds: Normal heart sounds. No murmur heard. Pulmonary:     Effort: Pulmonary effort is normal. No respiratory distress.     Breath sounds: Normal breath sounds. No stridor. No wheezing, rhonchi or rales.  Abdominal:     General: Bowel sounds are normal. There is no distension.     Palpations: Abdomen is soft. There is no mass.     Tenderness: There is no abdominal tenderness. There is no guarding or rebound.  Musculoskeletal:        General: No tenderness.     Cervical back: Full passive range of motion without pain, normal range of motion and neck supple.     Left lower leg: No edema.  Lymphadenopathy:     Cervical: No cervical adenopathy.  Skin:    General: Skin is warm and dry.     Coloration: Skin is not jaundiced or pale.     Findings: No bruising or rash.  Neurological:     Mental Status: She is alert and oriented to person, place, and time.     Cranial Nerves: No cranial nerve deficit, dysarthria or facial asymmetry.     Sensory: Sensation is intact. No sensory deficit.     Motor: No weakness, tremor, atrophy, abnormal muscle tone or pronator drift.     Coordination: Romberg sign negative. Coordination normal. Finger-Nose-Finger Test normal.     Gait: Gait normal.     Deep Tendon Reflexes: Reflexes are normal and symmetric.  Reflexes normal.     Comments: No focal cerebellar signs   Psychiatric:        Behavior: Behavior normal.        Thought Content: Thought content normal.           Assessment & Plan:   Problem List Items Addressed This Visit       Endocrine   Hypothyroid    In light of recent fatigue and memory change TSH and FT4 ordered  On levothyrox 75 mcg daily  No change in exam        Other   Cognitive decline    Suspect multifactorial Mostly short term memory/word retrieval  Mild dec in exec fxn  Discussed triggers including  Grief  Life change  New medical problems incl leukemia Medications  Stress/anxiety   Reassuring exam Labs ordered Will f/u for visit to review and do MMSE Helpful daughter from [place] was here as well      Relevant Orders   Comprehensive metabolic panel   TSH   T4, free   VITAMIN D 25 Hydroxy (Vit-D Deficiency, Fractures)   Fatigue    Fatigue with some cognitive slow down in setting of big life changes/ medically and socially  Lab today F/u planned to review No change on exam      Relevant Orders   Comprehensive metabolic panel   TSH   T4, free   VITAMIN D 25 Hydroxy (Vit-D Deficiency, Fractures)   Frequent urination    Ua is clear today  May be overactive bladder      Relevant Orders   POCT Urinalysis Dipstick (Automated) (Completed)   Grief    After losing husband Some mood and cognitive change  Enc her to return to grief counseling with [facility] Also continue meeting with her group      Large granular lymphocyte leukemia    Under care of onc Last note and labs noted Also working with a sub specialist at [facility]  On mtx for this and also RA      Short-term memory loss - Primary    Mostly word retrieval  Some -appts and meds   Disc in detail No confusion  Some cog slowing Suspect multi factorial  Age, meds, med problems, grief, large life changes Lab today F/u to do MMSE and rev results       Relevant Orders    Comprehensive metabolic panel   TSH   T4, free   VITAMIN D 25 Hydroxy (Vit-D Deficiency, Fractures)   Vision changes    Spots at times in vision  Enc her to f/u with her eye care provider Inst to take preservision daily inst of prn  ? What her past dx was   ER precautions noted        Vitamin D deficiency    Taking vit D from mvi, D and K supl and extra D  Lab today

## 2023-02-18 NOTE — Telephone Encounter (Signed)
Left VM requesting pt to call the office back 

## 2023-02-18 NOTE — Assessment & Plan Note (Signed)
Taking vit D from mvi, D and K supl and extra D  Lab today

## 2023-02-18 NOTE — Assessment & Plan Note (Signed)
Mostly word retrieval  Some -appts and meds   Disc in detail No confusion  Some cog slowing Suspect multi factorial  Age, meds, med problems, grief, large life changes Lab today F/u to do MMSE and rev results

## 2023-02-19 ENCOUNTER — Encounter: Payer: Self-pay | Admitting: Family Medicine

## 2023-02-19 NOTE — Telephone Encounter (Signed)
Phone call to pt.  Encouraged her to use ice today and heat tomorrow to help the bruise dissipate. Alternating 20 minutes on and 20 minutes off.  Pt denies pain and thanked me for the call.  Updated the PCP sticky note that no blood draws in the [facility].  She asked that I make sure that Dr. Glori Bickers is aware.  Routing message to Dr. Glori Bickers and Rexene Agent, CMA.

## 2023-02-23 ENCOUNTER — Other Ambulatory Visit: Payer: Self-pay | Admitting: Hematology

## 2023-02-23 ENCOUNTER — Other Ambulatory Visit: Payer: Self-pay

## 2023-02-23 MED ORDER — METHOTREXATE SODIUM 2.5 MG PO TABS: 2.5000 mg | ORAL_TABLET | ORAL | 1 refills | Status: DC

## 2023-02-23 NOTE — Progress Notes (Signed)
Verbally called in order to [facility] pharmacist with [facility]  [phone]) for Methotrexate 2.5mg  weekly with 2 refills.  [name] stated he will fill the prescription and call the pt when the prescription is ready for pickup.

## 2023-02-23 NOTE — Telephone Encounter (Signed)
When speaking with Jennifer Sellers regarding labs Jennifer Sellers wanted me to make it known that she doesn't want [name] to draw her blood anymore. Not sure who needs to document that so will route back to [place]

## 2023-02-24 ENCOUNTER — Other Ambulatory Visit: Payer: Self-pay | Admitting: Hematology

## 2023-03-04 ENCOUNTER — Encounter: Payer: Self-pay | Admitting: Family Medicine

## 2023-03-04 ENCOUNTER — Ambulatory Visit (INDEPENDENT_AMBULATORY_CARE_PROVIDER_SITE_OTHER): Payer: Medicare Other | Admitting: Family Medicine

## 2023-03-04 VITALS — BP 108/66 | HR 78 | Temp 97.8°F | Ht 66.5 in | Wt 133.5 lb

## 2023-03-04 DIAGNOSIS — R413 Other amnesia: Secondary | ICD-10-CM | POA: Diagnosis not present

## 2023-03-04 NOTE — Progress Notes (Signed)
Subjective:    Patient ID: Jennifer Sellers, female    DOB: 1945-11-25, 77 y.o.   MRN: [id number]  HPI Pt presents for f/u of memory concerns  Wt Readings from Last 3 Encounters:  03/04/23 133 lb 8 oz (60.6 kg)  02/18/23 136 lb 6 oz (61.9 kg)  02/18/23 135 lb 6.4 oz (61.4 kg)   21.22 kg/m  Vitals:   03/04/23 0831  BP: 108/66  Pulse: 78  Temp: 97.8 F (36.6 C)  SpO2: 98%    Last visit we discussed some concerns over short term memory Overall cognition less of a problem  Taking longer to finish tasks   2 daughters do a lot for her  Socializes most days  Goes to tai chi regularly  Memory loss has not caused any accidents or problems  Family is watching her closely    Drinks more water  Added vit D 3    Noted worse after losing husband and dx with leukemia  She thinks all the life changes play a big role   [place]= was [place] regarding leukemia   Takes folic acid and preservision  Also tamoxifen    Nl PHQ Nl ua   Lab Results  Component Value Date   TSH 2.49 02/18/2023   Free T4 nl at 1.13  Last vitamin D Lab Results  Component Value Date   VD25OH 21.76 (L) 02/18/2023   Noted this and added another 2000 iu daily Lab Results  Component Value Date   VITAMINB12 412 12/23/2022   Last metabolic panel Lab Results  Component Value Date   GLUCOSE 79 02/18/2023   NA 140 02/18/2023   K 4.2 02/18/2023   CL 106 02/18/2023   CO2 29 02/18/2023   BUN 14 02/18/2023   CREATININE 0.98 02/18/2023   GFRNONAA >60 02/18/2023   CALCIUM 8.7 02/18/2023   PHOS 3.1 01/02/2011   PROT 6.1 02/18/2023   ALBUMIN 3.8 02/18/2023   LABGLOB 2.6 09/09/2022   AGRATIO 1.3 09/09/2022   BILITOT 1.4 (H) 02/18/2023   ALKPHOS 81 02/18/2023   AST 17 02/18/2023   ALT 8 02/18/2023   ANIONGAP 6 02/18/2023   GFR 55.1  Counseled on fluid intake  Planned appt with eye specialist    MMSE today  Did very well  3 word recall - had to concentrate hard    Patient Active Problem List   Diagnosis Date Noted   Vision changes 02/18/2023   Frequent urination 02/18/2023   Cognitive decline 02/18/2023   Grief 02/18/2023   Short-term memory loss 02/18/2023   Fatigue 02/18/2023   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   Other pancytopenia 09/10/2022   Carpal joint sprain 06/10/2022   Radial styloid tenosynovitis 06/10/2022   Acquired trigger finger 06/10/2022   Malignant neoplasm of upper-outer quadrant of left breast in female, estrogen receptor positive 08/24/2020   Estrogen deficiency 05/24/2019   Hx of adenomatous colonic polyps 03/11/2018   Vitamin D deficiency 08/03/2015   Routine general medical examination at a health care facility 05/15/2015   Abnormal ankle brachial index 08/23/2014   Rheumatoid arthritis 06/19/2014   Thrombocytopenia 06/19/2014   Encounter for Medicare annual wellness exam 11/11/2013   Sainz-term use of Plaquenil 10/17/2013   Hyperbilirubinemia 08/19/2013   Hypothyroid 09/21/2012   NEOPLASMS UNSPEC NATURE BONE SOFT TISSUE&SKIN 10/23/2010   GOUTY TOPHI 10/23/2010   OTHER SYMPTOMS REFERABLE JOINT SITE UNSPECIFIED 10/23/2010   CARPAL TUNNEL SYNDROME, HX OF 04/05/2009   Leukocytopenia 05/02/2008  INSOMNIA, TRANSIENT 04/27/2007   Osteopenia 04/27/2007   Hyperlipidemia 04/23/2007   HIATAL HERNIA 04/23/2007   POSTMENOPAUSAL STATUS 04/23/2007   RHEUMATOID FACTOR, POSITIVE 04/23/2007   Past Medical History:  Diagnosis Date   Arthritis    rheumatoid arthritis   Hypothyroidism    Leukopenia    Thyroid disease    TIA (transient ischemic attack) 2011   Past Surgical History:  Procedure Laterality Date   ABDOMINAL HYSTERECTOMY     BREAST LUMPECTOMY WITH RADIOACTIVE SEED AND SENTINEL LYMPH NODE BIOPSY Left 10/05/2020   Procedure: LEFT BREAST LUMPECTOMY WITH RADIOACTIVE SEED AND SENTINEL LYMPH NODE BIOPSY;  Surgeon: Griselda Miner, MD;  Location: [facility];  Service: General;  Laterality:  Left;  GENERAL AND PECTORAL BLOCK   CARPAL TUNNEL RELEASE     COLONOSCOPY     COLONOSCOPY WITH PROPOFOL N/A 06/30/2018   Procedure: COLONOSCOPY WITH PROPOFOL;  Surgeon: Scot Jun, MD;  Location: [facility] ENDOSCOPY;  Service: Endoscopy;  Laterality: N/A;   colpo-ureteroscopy     lens surgery     Social History   Tobacco Use   Smoking status: Never   Smokeless tobacco: Never  Vaping Use   Vaping Use: Never used  Substance Use Topics   Alcohol use: Yes    Alcohol/week: 0.0 standard drinks of alcohol    Comment: occasional   Drug use: No   Family History  Problem Relation Age of Onset   Cancer Mother        lymphoma   Cancer Brother        lymphoma   Cancer Daughter        NHL   Allergies  Allergen Reactions   Misc. Sulfonamide Containing Compounds Rash    REACTION: reaction  not known REACTION: reaction not known   Cipro [Ciprofloxacin Hcl] Other (See Comments)    Unknown    Cortisporin [Bacitra-Neomycin-Polymyxin-Hc]     Burning / swelling of eyes with eye drops   Prevnar [Pneumococcal 13-Val Conj Vacc] Other (See Comments)    Severe local reaction    Sulfa Antibiotics    Sulfonamide Derivatives     REACTION: reaction not known   Current Outpatient Medications on File Prior to Visit  Medication Sig Dispense Refill   Biotin 1 MG CAPS      Cholecalciferol (OPTIMAL-D PO) Take 1 tablet by mouth daily.     Cholecalciferol 125 MCG (5000 UT) TABS      fluorometholone (FML) 0.1 % ophthalmic ointment      folic acid (FOLVITE) 1 MG tablet Take 1 tablet (1 mg total) by mouth daily. 30 tablet 2   levothyroxine (SYNTHROID) 75 MCG tablet Take 1 tablet (75 mcg total) by mouth daily. 90 tablet 3   methotrexate (RHEUMATREX) 2.5 MG tablet Take 1 tablet (2.5 mg total) by mouth once a week. Take with 15mg  tab, once a week  Caution:Chemotherapy. Protect from light. 8 tablet 1   Multiple Vitamins-Minerals (PRESERVISION AREDS 2 PO) Take 2 capsules by mouth daily.      NON FORMULARY  2 drops daily. sustane     OVER THE COUNTER MEDICATION Optimal longevi D K2     OVER THE COUNTER MEDICATION Liqua A     Prenatal Multivit w/ Iron-FA (NEONATAL FE PO) Take by mouth.     tamoxifen (NOLVADEX) 20 MG tablet Take 1 tablet (20 mg total) by mouth daily. 90 tablet 1   TREXALL 15 MG tablet TAKE 1 TABLET (15 MG TOTAL) BY MOUTH ONCE  A WEEK. CAUTION: CHEMOTHERAPY. PROTECT FROM LIGHT. 8 tablet 1   triamcinolone ointment (KENALOG) 0.1 %      No current facility-administered medications on file prior to visit.    Review of Systems  Constitutional:  Negative for activity change, appetite change, fatigue, fever and unexpected weight change.  HENT:  Negative for congestion, ear pain, rhinorrhea, sinus pressure and sore throat.   Eyes:  Negative for pain, redness and visual disturbance.  Respiratory:  Negative for cough, shortness of breath and wheezing.   Cardiovascular:  Negative for chest pain and palpitations.  Gastrointestinal:  Negative for abdominal pain, blood in stool, constipation and diarrhea.  Endocrine: Negative for polydipsia and polyuria.  Genitourinary:  Negative for dysuria, frequency and urgency.  Musculoskeletal:  Negative for arthralgias, back pain and myalgias.  Skin:  Negative for pallor and rash.  Allergic/Immunologic: Negative for environmental allergies.  Neurological:  Negative for dizziness, syncope and headaches.  Hematological:  Negative for adenopathy. Does not bruise/bleed easily.  Psychiatric/Behavioral:  Positive for decreased concentration. Negative for agitation, behavioral problems, confusion, dysphoric mood and hallucinations. The patient is not nervous/anxious.        Objective:   Physical Exam Constitutional:      General: She is not in acute distress.    Appearance: Normal appearance. She is well-developed and normal weight. She is not ill-appearing or diaphoretic.  HENT:     Head: Normocephalic and atraumatic.  Eyes:     Conjunctiva/sclera:  Conjunctivae normal.     Pupils: Pupils are equal, round, and reactive to light.  Neck:     Thyroid: No thyromegaly.     Vascular: No carotid bruit or JVD.  Cardiovascular:     Rate and Rhythm: Normal rate and regular rhythm.     Heart sounds: Normal heart sounds.     No gallop.  Pulmonary:     Effort: Pulmonary effort is normal. No respiratory distress.     Breath sounds: Normal breath sounds. No wheezing or rales.  Abdominal:     General: There is no distension or abdominal bruit.     Palpations: Abdomen is soft.  Musculoskeletal:     Cervical back: Normal range of motion and neck supple.     Right lower leg: No edema.     Left lower leg: No edema.  Lymphadenopathy:     Cervical: No cervical adenopathy.  Skin:    General: Skin is warm and dry.     Coloration: Skin is not pale.     Findings: No rash.  Neurological:     Mental Status: She is alert.     Coordination: Coordination normal.     Deep Tendon Reflexes: Reflexes are normal and symmetric. Reflexes normal.  Psychiatric:        Attention and Perception: Attention normal.        Mood and Affect: Mood normal. Mood is not anxious or depressed.        Speech: Speech normal.        Thought Content: Thought content normal.        Cognition and Memory: Cognition and memory normal.     Comments: Mentally sharp today   MMSE score is 30 (hesitated with 3 object memorization but did get it)   Mood is good           Assessment & Plan:   Problem List Items Addressed This Visit       Other   Short-term memory loss - Primary  Just short term memory-not much cognitive change at this point Labs reviewed from last visit- has started vit D3 and also drinking more water MMSE today was 30/30 (had hesitation on 3 obj recall but got it) Very good clock draw Reassuring   Has had stressors (husb died and she was dx with leukemia) Disc expected memory change with age  Disc ways to slow it down- socialization/brain activity  and exercise  Will continue to follow  Can ref to neuro at any time if worse  Suggested some sources of info

## 2023-03-04 NOTE — Assessment & Plan Note (Signed)
Just short term memory-not much cognitive change at this point Labs reviewed from last visit- has started vit D3 and also drinking more water MMSE today was 30/30 (had hesitation on 3 obj recall but got it) Very good clock draw Reassuring   Has had stressors (husb died and she was dx with leukemia) Disc expected memory change with age  Disc ways to slow it down- socialization/brain activity and exercise  Will continue to follow  Can ref to neuro at any time if worse  Suggested some sources of info

## 2023-03-04 NOTE — Patient Instructions (Addendum)
Your memory screen is very good today  We will follow this over time   Keep socializing  Be active - exercise helps your brain  Continue Tai chi   Add some strength training to your routine, this is important for bone and brain health and can reduce your risk of falls and help your body use insulin properly and regulate weight  Light weights, exercise bands , and internet videos are a good way to start  Yoga (chair or regular), machines , floor exercises or a gym with machines are also good options   Add this into routine at least 3 days per week and increase weight or resistance as you get stronger   If you want to see a neurologist in the future to discuss memory just let us know  I can refer (it takes a Muehl time to get in)   Let us know if things worsen   Check out the booK [employer] by Selmer Dominion  It is worth reading

## 2023-03-05 ENCOUNTER — Other Ambulatory Visit: Payer: Self-pay | Admitting: Hematology

## 2023-03-09 ENCOUNTER — Encounter: Payer: Self-pay | Admitting: Hematology

## 2023-03-20 ENCOUNTER — Other Ambulatory Visit: Payer: Self-pay

## 2023-03-20 ENCOUNTER — Inpatient Hospital Stay: Payer: Medicare Other | Attending: Hematology

## 2023-03-20 DIAGNOSIS — Z79810 Long term (current) use of selective estrogen receptor modulators (SERMs): Secondary | ICD-10-CM | POA: Insufficient documentation

## 2023-03-20 DIAGNOSIS — C50412 Malignant neoplasm of upper-outer quadrant of left female breast: Secondary | ICD-10-CM | POA: Insufficient documentation

## 2023-03-20 DIAGNOSIS — Z170 Estrogen receptor positive status [ER+]: Secondary | ICD-10-CM | POA: Insufficient documentation

## 2023-03-20 DIAGNOSIS — C91Z0 Other lymphoid leukemia not having achieved remission: Secondary | ICD-10-CM | POA: Diagnosis not present

## 2023-03-20 DIAGNOSIS — D649 Anemia, unspecified: Secondary | ICD-10-CM

## 2023-03-20 LAB — CMP (CANCER CENTER ONLY)
ALT: 7 U/L (ref 0–44)
AST: 17 U/L (ref 15–41)
Albumin: 3.7 g/dL (ref 3.5–5.0)
Alkaline Phosphatase: 63 U/L (ref 38–126)
Anion gap: 2 — ABNORMAL LOW (ref 5–15)
BUN: 12 mg/dL (ref 8–23)
CO2: 31 mmol/L (ref 22–32)
Calcium: 8.3 mg/dL — ABNORMAL LOW (ref 8.9–10.3)
Chloride: 109 mmol/L (ref 98–111)
Creatinine: 1.04 mg/dL — ABNORMAL HIGH (ref 0.44–1.00)
GFR, Estimated: 55 mL/min — ABNORMAL LOW (ref >60)
Glucose, Bld: 88 mg/dL (ref 70–99)
Potassium: 4.1 mmol/L (ref 3.5–5.1)
Sodium: 142 mmol/L (ref 135–145)
Total Bilirubin: 1.4 mg/dL — ABNORMAL HIGH (ref 0.3–1.2)
Total Protein: 6.1 g/dL — ABNORMAL LOW (ref 6.5–8.1)

## 2023-03-20 LAB — CBC WITH DIFFERENTIAL (CANCER CENTER ONLY)
Abs Immature Granulocytes: 0.02 10*3/uL (ref 0.00–0.07)
Basophils Absolute: 0 10*3/uL (ref 0.0–0.1)
Basophils Relative: 1 %
Eosinophils Absolute: 0 10*3/uL (ref 0.0–0.5)
Eosinophils Relative: 1 %
HCT: 33 % — ABNORMAL LOW (ref 36.0–46.0)
Hemoglobin: 11.2 g/dL — ABNORMAL LOW (ref 12.0–15.0)
Immature Granulocytes: 1 %
Lymphocytes Relative: 59 %
Lymphs Abs: 1 10*3/uL (ref 0.7–4.0)
MCH: 33.1 pg (ref 26.0–34.0)
MCHC: 33.9 g/dL (ref 30.0–36.0)
MCV: 97.6 fL (ref 80.0–100.0)
Monocytes Absolute: 0.1 10*3/uL (ref 0.1–1.0)
Monocytes Relative: 6 %
Neutro Abs: 0.6 10*3/uL — ABNORMAL LOW (ref 1.7–7.7)
Neutrophils Relative %: 32 %
Platelet Count: 129 10*3/uL — ABNORMAL LOW (ref 150–400)
RBC: 3.38 MIL/uL — ABNORMAL LOW (ref 3.87–5.11)
RDW: 14.1 % (ref 11.5–15.5)
WBC Count: 1.7 10*3/uL — ABNORMAL LOW (ref 4.0–10.5)
nRBC: 0 % (ref 0.0–0.2)

## 2023-03-20 LAB — FERRITIN: Ferritin: 47 ng/mL (ref 11–307)

## 2023-03-22 ENCOUNTER — Encounter: Payer: Self-pay | Admitting: Hematology

## 2023-04-15 ENCOUNTER — Telehealth: Payer: Self-pay | Admitting: Hematology

## 2023-04-15 NOTE — Telephone Encounter (Signed)
Left a voicemail regarding patients appointment being changed

## 2023-05-18 ENCOUNTER — Other Ambulatory Visit: Payer: Self-pay

## 2023-05-18 ENCOUNTER — Ambulatory Visit: Payer: Medicare Other | Admitting: Hematology

## 2023-05-18 ENCOUNTER — Other Ambulatory Visit: Payer: Medicare Other

## 2023-05-18 DIAGNOSIS — C50412 Malignant neoplasm of upper-outer quadrant of left female breast: Secondary | ICD-10-CM

## 2023-05-18 MED ORDER — TAMOXIFEN CITRATE 20 MG PO TABS
20.0000 mg | ORAL_TABLET | Freq: Every day | ORAL | 1 refills | Status: DC
Start: 2023-05-18 — End: 2023-10-23

## 2023-05-19 ENCOUNTER — Other Ambulatory Visit: Payer: Self-pay | Admitting: Hematology

## 2023-05-24 NOTE — Progress Notes (Signed)
Patient Care Team: Tower, Audrie Gallus, MD as PCP - General Lad, Julien Girt, MD as Referring Physician (Ophthalmology) Galen Manila, MD as Referring Physician (Ophthalmology) Breck Coons, DC as Consulting Physician (Chiropractic Medicine) Pershing Proud, RN as Oncology Nurse Navigator Donnelly Angelica, RN as Oncology Nurse Navigator Griselda Miner, MD as Consulting Physician (General Surgery) Malachy Mood, MD as Consulting Physician (Hematology) Dorothy Puffer, MD as Consulting Physician (Radiation Oncology) Pollyann Samples, NP as Nurse Practitioner (Nurse Practitioner) Alcus Dad., MD as Consulting Physician (Hematology and Oncology)   CHIEF COMPLAINT: Follow up large granular lymphocytic leukemia, and h/o breast cancer   Oncology History Overview Note  Cancer Staging Malignant neoplasm of upper-outer quadrant of left breast in female, estrogen receptor positive (HCC) Staging form: Breast, AJCC 8th Edition - Clinical stage from 08/29/2020: Stage IA (cT1c, cN0, cM0, G1, ER+, PR+, HER2-) - Unsigned - Pathologic stage from 10/31/2020: Stage IA (pT2, pN0(sn), cM0, G1, ER+, PR+, HER2-, Oncotype DX score: 12) - Unsigned    Malignant neoplasm of upper-outer quadrant of left breast in female, estrogen receptor positive (HCC)  08/02/2020 Mammogram   IMPRESSION The 1cm (0.9x1.2x0.9cm on US)asymmetry in the left breast at 1:00 position, 4 cmfn is indeterminate   08/22/2020 Initial Biopsy   Diagnosis 08/22/20 Breast, left, needle core biopsy, 1 o'clock, 4cmfn -INVASIVE MAMMARY CARCINOMA -SEE COMMENT       -Grade 1 or 3  E-cadherin is NEGATIVE supporting lobular origin   08/22/2020 Receptors her2   ER - 95%  PR - 25%  Ki67 - 10% HER2 Negative on [facility]   08/24/2020 Initial Diagnosis   Malignant neoplasm of upper-outer quadrant of left breast in female, estrogen receptor positive (HCC)   08/29/2020 Breast MRI   IMPRESSION: 1. Enhancing mass in the upper slightly outer  left breast measuring approximately 2.4 x 1.6 x 1.8 cm, consistent with the patient's known malignancy. 2. No MRI evidence of malignancy in the right breast. 3. No suspicious lymphadenopathy.   10/05/2020 Surgery   LEFT BREAST LUMPECTOMY WITH RADIOACTIVE SEED AND SENTINEL LYMPH NODE BIOPSY by Dr Carolynne Edouard    10/05/2020 Pathology Results   FINAL MICROSCOPIC DIAGNOSIS:   A. LYMPH NODE, LEFT AXILLARY #1, SENTINEL, EXCISION:  -  No carcinoma identified in one lymph node (0/1)  -  See comment   B. LYMPH NODE, LEFT AXILLARY, SENTINEL, EXCISION:  -  No carcinoma identified in one lymph node (0/1)  -  See comment   C. LYMPH NODE, LEFT AXILLARY #2, SENTINEL, EXCISION:  -  No carcinoma identified in one lymph node (0/1)  -  See comment   D. BREAST, LEFT, LUMPECTOMY:  -  Invasive lobular carcinoma, Nottingham grade 1 of 3, 2.3 cm  -  Margins uninvolved by carcinoma (<0.1 cm; superior margin)  -  Previous biopsy site changes present  -  See oncology table and comment below    10/05/2020 Oncotype testing   Oncoytpe  Recurrence Score 12 with distant recurrence risk at 9 years with AI or Tamoxifen at 3%  There is less than 1% benefit of chemotherapy.    11/19/2020 - 12/14/2020 Radiation Therapy   Adjuvant Radiation with Dr Mitzi Hansen    12/2020 -  Anti-estrogen oral therapy   Tamoxifen 20mg  daily starting 12/2020    03/13/2021 Survivorship   SCP delivered by [name], NP      CURRENT THERAPY:  LGLL: MTX with full dose 17.5mg  (10mg /m2) weekly.  One 15 mg  tablet , one  2.5 mg tablet  and take together once a week.  Breast cancer: Tamoxifen 12/2020, held 08/2022 Restarted tamoxifen 02/2023   INTERVAL HISTORY Ms. Jennifer Sellers returns for follow-up as scheduled, last seen by Dr. Mosetta Putt 02/2023.  Doing well overall without changes in her health.  Denies breast concerns such as new lump/mass, nipple discharge or inversion, or skin change.  Tolerating tamoxifen.  She thinks she is taking methotrexate 15 mg but  unsure about the additional 2.5 mg tablet.  She has a chronic dry cough ongoing about 5-6 months now, no fever or chills.  ROS  All other systems reviewed and negative  Past Medical History:  Diagnosis Date   Arthritis    rheumatoid arthritis   Hypothyroidism    Leukopenia    Thyroid disease    TIA (transient ischemic attack) 2011     Past Surgical History:  Procedure Laterality Date   ABDOMINAL HYSTERECTOMY     BREAST LUMPECTOMY WITH RADIOACTIVE SEED AND SENTINEL LYMPH NODE BIOPSY Left 10/05/2020   Procedure: LEFT BREAST LUMPECTOMY WITH RADIOACTIVE SEED AND SENTINEL LYMPH NODE BIOPSY;  Surgeon: Griselda Miner, MD;  Location: [facility];  Service: General;  Laterality: Left;  GENERAL AND PECTORAL BLOCK   CARPAL TUNNEL RELEASE     COLONOSCOPY     COLONOSCOPY WITH PROPOFOL N/A 06/30/2018   Procedure: COLONOSCOPY WITH PROPOFOL;  Surgeon: Scot Jun, MD;  Location[facility] ENDOSCOPY;  Service: Endoscopy;  Laterality: N/A;   colpo-ureteroscopy     lens surgery       Outpatient Encounter Medications as of 05/26/2023  Medication Sig   Biotin 1 MG CAPS    Cholecalciferol (OPTIMAL-D PO) Take 1 tablet by mouth daily.   Cholecalciferol 125 MCG (5000 UT) TABS    folic acid (FOLVITE) 1 MG tablet TAKE 1 TABLET BY MOUTH EVERY DAY   levothyroxine (SYNTHROID) 75 MCG tablet Take 1 tablet (75 mcg total) by mouth daily.   Multiple Vitamins-Minerals (PRESERVISION AREDS 2 PO) Take 2 capsules by mouth daily.    NON FORMULARY 2 drops daily. sustane   OVER THE COUNTER MEDICATION Optimal longevi D K2   Prenatal Multivit w/ Iron-FA (NEONATAL FE PO) Take by mouth.   tamoxifen (NOLVADEX) 20 MG tablet Take 1 tablet (20 mg total) by mouth daily.   TREXALL 15 MG tablet TAKE 1 TABLET (15 MG TOTAL) BY MOUTH ONCE A WEEK. CAUTION: CHEMOTHERAPY. PROTECT FROM LIGHT.   triamcinolone ointment (KENALOG) 0.1 %    fluorometholone (FML) 0.1 % ophthalmic ointment  (Patient not taking: Reported on  05/26/2023)   methotrexate (RHEUMATREX) 2.5 MG tablet Take 1 tablet (2.5 mg total) by mouth once a week. Take with 15mg  tab, once a week  Caution:Chemotherapy. Protect from light. (Patient not taking: Reported on 05/26/2023)   OVER THE COUNTER MEDICATION Liqua A (Patient not taking: Reported on 05/26/2023)   No facility-administered encounter medications on file as of 05/26/2023.     Today's Vitals   05/26/23 0907  BP: 120/67  Pulse: 74  Resp: 16  Temp: 99 F (37.2 C)  TempSrc: Oral  SpO2: 100%  Weight: 133 lb 4.8 oz (60.5 kg)   Body mass index is 21.19 kg/m.   PHYSICAL EXAM GENERAL:alert, no distress and comfortable SKIN: no rash  EYES: sclera clear NECK: without mass LYMPH:  no palpable cervical or supraclavicular lymphadenopathy  LUNGS: Decreased on the right, otherwise, clear with normal breathing effort HEART: regular rate & rhythm, no lower extremity edema NEURO: alert & oriented  x 3 with fluent speech, no focal motor/sensory deficits Breast exam: Symmetric without nipple discharge or inversion.  S/p left lumpectomy and radiation, incisions completely healed.  No palpable mass or nodularity in either breast or axilla that I could appreciate   CBC    Component Value Date/Time   WBC 1.8 (L) 05/26/2023 0850   WBC 1.5 (L) 09/25/2022 0836   RBC 3.40 (L) 05/26/2023 0850   HGB 11.6 (L) 05/26/2023 0850   HGB 12.3 06/19/2014 1019   HCT 34.2 (L) 05/26/2023 0850   HCT 37.2 08/18/2022 0932   HCT 35.5 06/19/2014 1019   PLT 119 (L) 05/26/2023 0850   PLT 124 (L) 06/19/2014 1019   MCV 100.6 (H) 05/26/2023 0850   MCV 92.2 06/19/2014 1019   MCH 34.1 (H) 05/26/2023 0850   MCHC 33.9 05/26/2023 0850   RDW 13.9 05/26/2023 0850   RDW 13.2 06/19/2014 1019   LYMPHSABS 0.9 05/26/2023 0850   LYMPHSABS 0.7 (L) 06/19/2014 1019   MONOABS 0.1 05/26/2023 0850   MONOABS 0.2 06/19/2014 1019   EOSABS 0.0 05/26/2023 0850   EOSABS 0.0 06/19/2014 1019   EOSABS 0.1 11/14/2008 1447   BASOSABS 0.0  05/26/2023 0850   BASOSABS 0.0 06/19/2014 1019     CMP     Component Value Date/Time   NA 143 05/26/2023 0850   K 4.2 05/26/2023 0850   CL 109 05/26/2023 0850   CO2 29 05/26/2023 0850   GLUCOSE 59 (L) 05/26/2023 0850   BUN 15 05/26/2023 0850   CREATININE 1.05 (H) 05/26/2023 0850   CALCIUM 8.6 (L) 05/26/2023 0850   PROT 6.1 (L) 05/26/2023 0850   ALBUMIN 3.5 05/26/2023 0850   AST 20 05/26/2023 0850   ALT 8 05/26/2023 0850   ALKPHOS 61 05/26/2023 0850   BILITOT 1.3 (H) 05/26/2023 0850   GFRNONAA 54 (L) 05/26/2023 0850   GFRAA 129 05/01/2008 0949     ASSESSMENT & PLAN:Breana H Challis is a 77 y.o. 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  -bone marrow biopsy on 09/25/22 showed: Hypercellular bone marrow with an atypical T-cell lymphocytosis (30% to 60%; aspirate versus flow cytometry) consisting predominantly of CD8 positive T cells with dim CD5. Morphologically large granular lymphocytes are not detected; however, the phenotype and clinical history of rheumatoid arthritis suggest possible large granular lymphocytosis.  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  -further molecular testing showed T-cell gamma gene rearrangement positive, and LGL flow cytometry showed increased T-cell large granular lymphocyte (28.6% total cells), supporting the diagnosis of LGL -she started MTX at low dose 5mg  wekely on 12/14/2022, seen by LGL leukemia expert Dr. Irving Copas at [facility] and her diagnose was confirmed; - Dr. Irving Copas also recommend MTX with full dose 17.5mg  (10mg /m2) weekly.  -Ms. Herandez appears stable. Tolerating MTX, we reviewed her dose. Mild pancytopenia is  stable. Continue same dose -F/up in 3 months    Malignant neoplasm of upper-outer quadrant of left breast in female, estrogen receptor positive (HCC) -diagnosed in 08/2020. 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 Oncotype RS 12, low risk. S/p adjuvant radiation. -she  began Tamoxifen 12/2020, tolerating well overall. Held since 08/18/22 for work up of leukopenia. Restarted 02/2023  -Ms. Mace is clinically doing well from breast cancer standpoint.  Tolerating tamoxifen.  Exam is benign, labs are stable.  Overall no clinical concern for recurrence -Continue breast cancer surveillance and tamoxifen.  Due mammogram and DEXA in September, will call with results   Dry cough  -Onset 12/2022, we have been monitoring -She also has rhinorrhea, likely allergy in nature, but given her neutropenia will obtain CXR to r/o infection   PLAN: -Labs reviewed -Continue breast cancer surveillance and Tamoxifen -CXR today for cough, will call with results -Continue MTX 17.5 mg weekly  -F/up in 3 months, or sooner if needed   Orders Placed This Encounter  Procedures   DG Chest 2 View    Standing Status:   Future    Number of Occurrences:   1    Standing Expiration Date:   05/25/2024    Order Specific Question:   Reason for Exam (SYMPTOM  OR DIAGNOSIS REQUIRED)    Answer:   6 month dry cough, h/o leukemia, immunosuppression    Order Specific Question:   Preferred imaging location?    Answer:   [facility]      All questions were answered. The patient knows to call the clinic with any problems, questions or concerns. No barriers to learning were detected.   [name], NP-C 05/26/2023

## 2023-05-26 ENCOUNTER — Ambulatory Visit (HOSPITAL_COMMUNITY)
Admission: RE | Admit: 2023-05-26 | Discharge: 2023-05-26 | Disposition: A | Discharge status: Home / Self Care | Payer: Medicare Other | Source: Ambulatory Visit | Attending: Nurse Practitioner | Admitting: Nurse Practitioner

## 2023-05-26 ENCOUNTER — Other Ambulatory Visit: Payer: Self-pay

## 2023-05-26 ENCOUNTER — Encounter: Payer: Self-pay | Admitting: Nurse Practitioner

## 2023-05-26 ENCOUNTER — Inpatient Hospital Stay: Payer: Medicare Other | Attending: Hematology

## 2023-05-26 ENCOUNTER — Inpatient Hospital Stay: Payer: Medicare Other | Admitting: Nurse Practitioner

## 2023-05-26 VITALS — BP 120/67 | HR 74 | Temp 99.0°F | Resp 16 | Wt 133.3 lb

## 2023-05-26 DIAGNOSIS — D61818 Other pancytopenia: Secondary | ICD-10-CM | POA: Diagnosis not present

## 2023-05-26 DIAGNOSIS — C91Z0 Other lymphoid leukemia not having achieved remission: Secondary | ICD-10-CM | POA: Insufficient documentation

## 2023-05-26 DIAGNOSIS — R059 Cough, unspecified: Secondary | ICD-10-CM | POA: Diagnosis not present

## 2023-05-26 DIAGNOSIS — Z170 Estrogen receptor positive status [ER+]: Secondary | ICD-10-CM

## 2023-05-26 DIAGNOSIS — J3489 Other specified disorders of nose and nasal sinuses: Secondary | ICD-10-CM | POA: Insufficient documentation

## 2023-05-26 DIAGNOSIS — C50412 Malignant neoplasm of upper-outer quadrant of left female breast: Secondary | ICD-10-CM

## 2023-05-26 DIAGNOSIS — Z90710 Acquired absence of both cervix and uterus: Secondary | ICD-10-CM | POA: Insufficient documentation

## 2023-05-26 DIAGNOSIS — D649 Anemia, unspecified: Secondary | ICD-10-CM

## 2023-05-26 DIAGNOSIS — Z79810 Long term (current) use of selective estrogen receptor modulators (SERMs): Secondary | ICD-10-CM | POA: Insufficient documentation

## 2023-05-26 DIAGNOSIS — R058 Other specified cough: Secondary | ICD-10-CM | POA: Insufficient documentation

## 2023-05-26 LAB — FERRITIN: Ferritin: 54 ng/mL (ref 11–307)

## 2023-05-26 LAB — CBC WITH DIFFERENTIAL (CANCER CENTER ONLY)
Abs Immature Granulocytes: 0 10*3/uL (ref 0.00–0.07)
Basophils Absolute: 0 10*3/uL (ref 0.0–0.1)
Basophils Relative: 2 %
Eosinophils Absolute: 0 10*3/uL (ref 0.0–0.5)
Eosinophils Relative: 2 %
HCT: 34.2 % — ABNORMAL LOW (ref 36.0–46.0)
Hemoglobin: 11.6 g/dL — ABNORMAL LOW (ref 12.0–15.0)
Immature Granulocytes: 0 %
Lymphocytes Relative: 52 %
Lymphs Abs: 0.9 10*3/uL (ref 0.7–4.0)
MCH: 34.1 pg — ABNORMAL HIGH (ref 26.0–34.0)
MCHC: 33.9 g/dL (ref 30.0–36.0)
MCV: 100.6 fL — ABNORMAL HIGH (ref 80.0–100.0)
Monocytes Absolute: 0.1 10*3/uL (ref 0.1–1.0)
Monocytes Relative: 5 %
Neutro Abs: 0.7 10*3/uL — ABNORMAL LOW (ref 1.7–7.7)
Neutrophils Relative %: 39 %
Platelet Count: 119 10*3/uL — ABNORMAL LOW (ref 150–400)
RBC: 3.4 MIL/uL — ABNORMAL LOW (ref 3.87–5.11)
RDW: 13.9 % (ref 11.5–15.5)
WBC Count: 1.8 10*3/uL — ABNORMAL LOW (ref 4.0–10.5)
nRBC: 0 % (ref 0.0–0.2)

## 2023-05-26 LAB — CMP (CANCER CENTER ONLY)
ALT: 8 U/L (ref 0–44)
AST: 20 U/L (ref 15–41)
Albumin: 3.5 g/dL (ref 3.5–5.0)
Alkaline Phosphatase: 61 U/L (ref 38–126)
Anion gap: 5 (ref 5–15)
BUN: 15 mg/dL (ref 8–23)
CO2: 29 mmol/L (ref 22–32)
Calcium: 8.6 mg/dL — ABNORMAL LOW (ref 8.9–10.3)
Chloride: 109 mmol/L (ref 98–111)
Creatinine: 1.05 mg/dL — ABNORMAL HIGH (ref 0.44–1.00)
GFR, Estimated: 54 mL/min — ABNORMAL LOW (ref >60)
Glucose, Bld: 59 mg/dL — ABNORMAL LOW (ref 70–99)
Potassium: 4.2 mmol/L (ref 3.5–5.1)
Sodium: 143 mmol/L (ref 135–145)
Total Bilirubin: 1.3 mg/dL — ABNORMAL HIGH (ref 0.3–1.2)
Total Protein: 6.1 g/dL — ABNORMAL LOW (ref 6.5–8.1)

## 2023-05-27 ENCOUNTER — Telehealth: Payer: Medicare Other | Admitting: Family Medicine

## 2023-05-27 NOTE — Telephone Encounter (Signed)
Called patient advised that Jennifer Sellers is not in office. That any provider that could treat her would require office visit. I have scheduled with Dr. Ermalene Searing tomorrow morning. Reviewed red words with patient if any she will go to ED or urgent care

## 2023-05-27 NOTE — Telephone Encounter (Signed)
Patient called in stating that at her chiropractic appointment she was told that she had a tick bite on her elbow and need doxycycline. I advised her that she may need to be evaluated to have anything called in and she refused. She said"send the message to [employer],and she what she can do for me"

## 2023-05-27 NOTE — Telephone Encounter (Signed)
Thanks  I am out of town  Aware, will watch for correspondence

## 2023-05-28 ENCOUNTER — Ambulatory Visit (INDEPENDENT_AMBULATORY_CARE_PROVIDER_SITE_OTHER): Payer: Medicare Other | Admitting: Family Medicine

## 2023-05-28 ENCOUNTER — Encounter: Payer: Self-pay | Admitting: Family Medicine

## 2023-05-28 ENCOUNTER — Other Ambulatory Visit: Payer: Self-pay

## 2023-05-28 VITALS — BP 120/70 | HR 77 | Temp 98.7°F | Ht 66.5 in | Wt 131.4 lb

## 2023-05-28 DIAGNOSIS — S50362A Insect bite (nonvenomous) of left elbow, initial encounter: Secondary | ICD-10-CM | POA: Diagnosis not present

## 2023-05-28 DIAGNOSIS — W57XXXA Bitten or stung by nonvenomous insect and other nonvenomous arthropods, initial encounter: Secondary | ICD-10-CM | POA: Diagnosis not present

## 2023-05-28 DIAGNOSIS — L239 Allergic contact dermatitis, unspecified cause: Secondary | ICD-10-CM | POA: Diagnosis not present

## 2023-05-28 MED ORDER — TRIAMCINOLONE ACETONIDE 0.5 % EX CREA: 1.0000 | TOPICAL_CREAM | Freq: Two times a day (BID) | CUTANEOUS | 0 refills | Status: AC

## 2023-05-28 NOTE — Assessment & Plan Note (Signed)
Acute, secondary to insect bite, rubbing of the elbows due to prominent joint. No concern for tickborne illness. No bacterial infection Skin changes related.  Allergic reaction and excessive rubbing of elbow. Treat with triamcinolone 0.5% twice daily x 2 weeks.  Return and ER precautions provided

## 2023-05-28 NOTE — Progress Notes (Signed)
Patient ID: Jennifer Sellers, female    DOB: 08/31/1946, 77 y.o.   MRN: [id number]  This visit was conducted in person.  BP 120/70 (BP Location: Right Arm, Patient Position: Sitting, Cuff Size: Normal)   Pulse 77   Temp 98.7 F (37.1 C) (Temporal)   Ht 5' 6.5" (1.689 m)   Wt 131 lb 6 oz (59.6 kg)   SpO2 100%   BMI 20.89 kg/m    CC:  Chief Complaint  Patient presents with   Insect Bite    Left Elbow 2 months ago-Still itching   Cyst    Knot on Left Elbow    Subjective:   HPI: Jennifer Sellers is a 77 y.o. female presenting on 05/28/2023 for Insect Bite (Left Elbow 2 months ago-Still itching) and Cyst (Knot on Left Elbow)   2 months ago had  tick bite on left inner elbow....  she noted while outside. Still very itchy, no redness, no swelling. Has been applying topical med.  2.  She has also noted concerning cyst/knot on her left elbow Has nodules in both elbows likely from her rheumatoid arthritis.   No fever, no neuro changes.       Relevant past medical, surgical, family and social history reviewed and updated as indicated. Interim medical history since our last visit reviewed. Allergies and medications reviewed and updated. Outpatient Medications Prior to Visit  Medication Sig Dispense Refill   Biotin 1 MG CAPS      Cholecalciferol (OPTIMAL-D PO) Take 1 tablet by mouth daily.     Cholecalciferol 125 MCG (5000 UT) TABS      folic acid (FOLVITE) 1 MG tablet Take 2 mg by mouth daily.     levothyroxine (SYNTHROID) 75 MCG tablet Take 1 tablet (75 mcg total) by mouth daily. 90 tablet 3   methotrexate (RHEUMATREX) 2.5 MG tablet Take 1 tablet (2.5 mg total) by mouth once a week. Take with 15mg  tab, once a week  Caution:Chemotherapy. Protect from light. 8 tablet 1   Multiple Vitamins-Minerals (PRESERVISION AREDS 2 PO) Take 2 capsules by mouth daily.      NON FORMULARY 2 drops daily. sustane     OVER THE COUNTER MEDICATION Optimal longevi D K2     Prenatal Multivit w/  Iron-FA (NEONATAL FE PO) Take by mouth.     tamoxifen (NOLVADEX) 20 MG tablet Take 1 tablet (20 mg total) by mouth daily. 90 tablet 1   TREXALL 15 MG tablet TAKE 1 TABLET (15 MG TOTAL) BY MOUTH ONCE A WEEK. CAUTION: CHEMOTHERAPY. PROTECT FROM LIGHT. 8 tablet 1   triamcinolone ointment (KENALOG) 0.1 %      folic acid (FOLVITE) 1 MG tablet TAKE 1 TABLET BY MOUTH EVERY DAY (Patient taking differently: Take 2 mg by mouth daily.) 90 tablet 0   fluorometholone (FML) 0.1 % ophthalmic ointment  (Patient not taking: Reported on 05/26/2023)     OVER THE COUNTER MEDICATION Liqua A (Patient not taking: Reported on 05/26/2023)     No facility-administered medications prior to visit.     Per HPI unless specifically indicated in ROS section below Review of Systems  Constitutional:  Negative for fatigue and fever.  HENT:  Negative for congestion.   Eyes:  Negative for pain.  Respiratory:  Negative for cough and shortness of breath.   Cardiovascular:  Negative for chest pain, palpitations and leg swelling.  Gastrointestinal:  Negative for abdominal pain.  Genitourinary:  Negative for dysuria and vaginal bleeding.  Musculoskeletal:  Negative for back pain.  Neurological:  Negative for syncope, light-headedness and headaches.  Psychiatric/Behavioral:  Negative for dysphoric mood.    Objective:  BP 120/70 (BP Location: Right Arm, Patient Position: Sitting, Cuff Size: Normal)   Pulse 77   Temp 98.7 F (37.1 C) (Temporal)   Ht 5' 6.5" (1.689 m)   Wt 131 lb 6 oz (59.6 kg)   SpO2 100%   BMI 20.89 kg/m   Wt Readings from Last 3 Encounters:  05/28/23 131 lb 6 oz (59.6 kg)  05/26/23 133 lb 4.8 oz (60.5 kg)  03/04/23 133 lb 8 oz (60.6 kg)      Physical Exam Constitutional:      General: She is not in acute distress.    Appearance: Normal appearance. She is well-developed. She is not ill-appearing or toxic-appearing.  HENT:     Head: Normocephalic.     Right Ear: Hearing, tympanic membrane, ear canal  and external ear normal. Tympanic membrane is not erythematous, retracted or bulging.     Left Ear: Hearing, tympanic membrane, ear canal and external ear normal. Tympanic membrane is not erythematous, retracted or bulging.     Nose: No mucosal edema or rhinorrhea.     Right Sinus: No maxillary sinus tenderness or frontal sinus tenderness.     Left Sinus: No maxillary sinus tenderness or frontal sinus tenderness.     Mouth/Throat:     Mouth: Oropharynx is clear and moist and mucous membranes are normal.     Pharynx: Uvula midline.  Eyes:     General: Lids are normal. Lids are everted, no foreign bodies appreciated.     Extraocular Movements: EOM normal.     Conjunctiva/sclera: Conjunctivae normal.     Pupils: Pupils are equal, round, and reactive to light.  Neck:     Thyroid: No thyroid mass or thyromegaly.     Vascular: No carotid bruit.     Trachea: Trachea normal.  Cardiovascular:     Rate and Rhythm: Normal rate and regular rhythm.     Pulses: Normal pulses.     Heart sounds: Normal heart sounds, S1 normal and S2 normal. No murmur heard.    No friction rub. No gallop.  Pulmonary:     Effort: Pulmonary effort is normal. No tachypnea or respiratory distress.     Breath sounds: Normal breath sounds. No decreased breath sounds, wheezing, rhonchi or rales.  Abdominal:     General: Bowel sounds are normal.     Palpations: Abdomen is soft.     Tenderness: There is no abdominal tenderness.  Musculoskeletal:     Cervical back: Normal range of motion and neck supple.  Skin:    General: Skin is warm, dry and intact.     Findings: No rash.     Comments: Prominent bony changes bilateral elbows Skin thickening left elbow with dry scaly flake, no underlying abscess or fluctuance See photo  Neurological:     Mental Status: She is alert.  Psychiatric:        Mood and Affect: Mood is not anxious or depressed.        Speech: Speech normal.        Behavior: Behavior normal. Behavior is  cooperative.        Thought Content: Thought content normal.        Cognition and Memory: Cognition and memory normal.        Judgment: Judgment normal.       Results for orders placed or  performed in visit on 05/26/23  CMP (Cancer Center only)  Result Value Ref Range   Sodium 143 135 - 145 mmol/L   Potassium 4.2 3.5 - 5.1 mmol/L   Chloride 109 98 - 111 mmol/L   CO2 29 22 - 32 mmol/L   Glucose, Bld 59 (L) 70 - 99 mg/dL   BUN 15 8 - 23 mg/dL   Creatinine 1.32 (H) 4.40 - 1.00 mg/dL   Calcium 8.6 (L) 8.9 - 10.3 mg/dL   Total Protein 6.1 (L) 6.5 - 8.1 g/dL   Albumin 3.5 3.5 - 5.0 g/dL   AST 20 15 - 41 U/L   ALT 8 0 - 44 U/L   Alkaline Phosphatase 61 38 - 126 U/L   Total Bilirubin 1.3 (H) 0.3 - 1.2 mg/dL   GFR, Estimated 54 (L) >60 mL/min   Anion gap 5 5 - 15  CBC with Differential (Cancer Center Only)  Result Value Ref Range   WBC Count 1.8 (L) 4.0 - 10.5 K/uL   RBC 3.40 (L) 3.87 - 5.11 MIL/uL   Hemoglobin 11.6 (L) 12.0 - 15.0 g/dL   HCT 10.2 (L) 72.5 - 36.6 %   MCV 100.6 (H) 80.0 - 100.0 fL   MCH 34.1 (H) 26.0 - 34.0 pg   MCHC 33.9 30.0 - 36.0 g/dL   RDW 44.0 34.7 - 42.5 %   Platelet Count 119 (L) 150 - 400 K/uL   nRBC 0.0 0.0 - 0.2 %   Neutrophils Relative % 39 %   Neutro Abs 0.7 (L) 1.7 - 7.7 K/uL   Lymphocytes Relative 52 %   Lymphs Abs 0.9 0.7 - 4.0 K/uL   Monocytes Relative 5 %   Monocytes Absolute 0.1 0.1 - 1.0 K/uL   Eosinophils Relative 2 %   Eosinophils Absolute 0.0 0.0 - 0.5 K/uL   Basophils Relative 2 %   Basophils Absolute 0.0 0.0 - 0.1 K/uL   Immature Granulocytes 0 %   Abs Immature Granulocytes 0.00 0.00 - 0.07 K/uL  Ferritin  Result Value Ref Range   Ferritin 54 11 - 307 ng/mL    Assessment and Plan  There are no diagnoses linked to this encounter.  No follow-ups on file.   Kerby Nora, MD

## 2023-05-28 NOTE — Patient Instructions (Signed)
Apply topical steroid cream twice daily to left elbow for 2 weeks.

## 2023-06-01 ENCOUNTER — Telehealth: Payer: Self-pay

## 2023-06-01 MED ORDER — METHOTREXATE SODIUM 15 MG PO TABS: 15.0000 mg | ORAL_TABLET | ORAL | 1 refills | Status: DC

## 2023-06-01 MED ORDER — METHOTREXATE SODIUM 2.5 MG PO TABS: 2.5000 mg | ORAL_TABLET | ORAL | 1 refills | Status: DC

## 2023-06-01 NOTE — Telephone Encounter (Signed)
Pt called wanting to know the results of her recent CXR.  This nurse consulted w/Dr. Mosetta Putt and informed pt that recent CXR showed her lungs are clear, 2 clips in lft side (1 in lft axilla and lft chest wall), no osseous abnormalities, but does have mild scoliosis.  Pt verbalized understanding and was happy to hear the results.  Pt also asked about her mammogram results.  Informed pt based on what this nurse can see Dr. Latanya Maudlin office has not been updated on pt's recent mammogram.  Pt was very unsure if she had a mammogram and did not recall where the mammogram was done.  Searched through pt's chart to inform pt that her past mammograms were done at [facility].  Pt stated "Yes" that's the place.  Pt then asked about her Methotrexate dose.  Pt stated when she saw [name], NP, she was told that her Methotrexate dose is supposed to be 17.5mg  once a week.  Pt stated Dr. Mosetta Putt reduced her dose to 15mg  once a week but was not sure as to why her dose was reduced to 15mg  weekly.  Reviewed pt's chart and Dr. Latanya Maudlin last office note with the pt stated the pt's dose should be 17.5mg  weekly per recommendation notes from [facility] Specialist Dr. Baird Lyons (see media for scanned clinic note).  This nurse consulted with Dr. Mosetta Putt to confirm pt's Methotrexate dose and Dr. Mosetta Putt confirmed pt's dose should be 17.5mg  weekly.  After reviewing the pt's medication list the confusion is the names of the different strengths of the Methotrexate.  Methotrexate does not come in a 17.5mg  dose; therefore, the pt was ordered a 2.5mg  and a 15mg .  Each milligram has a different drug name.  The 2.5mg  strength is Methotrexate (Rheumatrex) and the 15mg  methotrexate (Trexall).  This nurse feels this is what's causing the confusion with the pt regarding her dose.  Pt stated she's taking the 15mg  Trexall every Sunday but is not taking the 2.5mg  Rheumatrex and stated she doesn't have any of the Rheumatrex at home.  Pt also stated she needs a refill  for the Trexall.  Dr. Mosetta Putt notified of the confusion and refill paper prescriptions were printed and faxed to pt's pharmacy [facility] in [place], [place].  Fax confirmation received.  Also called pharmacy to confirm prescriptions were received. Spoke w/[name] in [facility] regarding prescriptions.  Provided [name] w/diagnosis code and [name]  [name] stated he will refill the 2.5mg  but pt's insurance is denying the 15mg .  [name] stated he will continue to try the pt's insurance for the 15mg  and will contact Dr. Latanya Maudlin office if the insurance continues to deny prescription.   Reeducated pt again on dose and when to take both doses (2.5mg  & 15mg  for a total dose of 17.5mg ) weekly.  Pt stated she takes the [place] on Sundays.  Instructed pt to take 1 Rheumatrex and 1 Trexall starting Sunday, 06/08/2023.  Pt verbalized understanding and had no further questions or concerns.    Pt was a little upset that she was taking her methotrexate incorrectly.  Notified Dr. Mosetta Putt and [name], NP.

## 2023-06-06 ENCOUNTER — Other Ambulatory Visit: Payer: Self-pay | Admitting: Family Medicine

## 2023-06-15 ENCOUNTER — Ambulatory Visit (INDEPENDENT_AMBULATORY_CARE_PROVIDER_SITE_OTHER): Payer: Medicare Other

## 2023-06-15 VITALS — Ht 67.0 in | Wt 130.0 lb

## 2023-06-15 DIAGNOSIS — Z0000 Encounter for general adult medical examination without abnormal findings: Secondary | ICD-10-CM | POA: Diagnosis not present

## 2023-06-15 DIAGNOSIS — Z780 Asymptomatic menopausal state: Secondary | ICD-10-CM

## 2023-06-15 DIAGNOSIS — Z1231 Encounter for screening mammogram for malignant neoplasm of breast: Secondary | ICD-10-CM

## 2023-06-15 NOTE — Patient Instructions (Signed)
Jennifer Sellers , Thank you for taking time to come for your Medicare Wellness Visit. I appreciate your ongoing commitment to your health goals. Please review the following plan we discussed and let me know if I can assist you in the future.   Referrals/Orders/Follow-Ups/Clinician Recommendations: Aim for 30 minutes of exercise or brisk walking, 6-8 glasses of water, and 5 servings of fruits and vegetables each day.   This is a list of the screening recommended for you and due dates:  Health Maintenance  Topic Date Due   COVID-19 Vaccine (4 - 2023-24 season) 07/18/2022   Medicare Annual Wellness Visit  06/12/2023   Zoster (Shingles) Vaccine (1 of 2) 06/10/2031*   Flu Shot  06/18/2023   Mammogram  07/23/2023   DTaP/Tdap/Td vaccine (3 - Tdap) 02/03/2028   DEXA scan (bone density measurement)  Completed   Hepatitis C Screening  Completed   HPV Vaccine  Aged Out   Pneumonia Vaccine  Discontinued   Colon Cancer Screening  Discontinued  *Topic was postponed. The date shown is not the original due date.    Advanced directives: (Declined) Advance directive discussed with you today. Even though you declined this today, please call our office should you change your mind, and we can give you the proper paperwork for you to fill out.  Next Medicare Annual Wellness Visit scheduled for next year: Yes  Preventive Care 23 Years and Older, Female Preventive care refers to lifestyle choices and visits with your health care provider that can promote health and wellness. What does preventive care include? A yearly physical exam. This is also called an annual well check. Dental exams once or twice a year. Routine eye exams. Ask your health care provider how often you should have your eyes checked. Personal lifestyle choices, including: Daily care of your teeth and gums. Regular physical activity. Eating a healthy diet. Avoiding tobacco and drug use. Limiting alcohol use. Practicing safe sex. Taking  low-dose aspirin every day. Taking vitamin and mineral supplements as recommended by your health care provider. What happens during an annual well check? The services and screenings done by your health care provider during your annual well check will depend on your age, overall health, lifestyle risk factors, and family history of disease. Counseling  Your health care provider may ask you questions about your: Alcohol use. Tobacco use. Drug use. Emotional well-being. Home and relationship well-being. Sexual activity. Eating habits. History of falls. Memory and ability to understand (cognition). Work and work Astronomer. Reproductive health. Screening  You may have the following tests or measurements: Height, weight, and BMI. Blood pressure. Lipid and cholesterol levels. These may be checked every 5 years, or more frequently if you are over 70 years old. Skin check. Lung cancer screening. You may have this screening every year starting at age 45 if you have a 30-pack-year history of smoking and currently smoke or have quit within the past 15 years. Fecal occult blood test (FOBT) of the stool. You may have this test every year starting at age 46. Flexible sigmoidoscopy or colonoscopy. You may have a sigmoidoscopy every 5 years or a colonoscopy every 10 years starting at age 50. Hepatitis C blood test. Hepatitis B blood test. Sexually transmitted disease (STD) testing. Diabetes screening. This is done by checking your blood sugar (glucose) after you have not eaten for a while (fasting). You may have this done every 1-3 years. Bone density scan. This is done to screen for osteoporosis. You may have this done starting  at age 60. Mammogram. This may be done every 1-2 years. Talk to your health care provider about how often you should have regular mammograms. Talk with your health care provider about your test results, treatment options, and if necessary, the need for more tests. Vaccines   Your health care provider may recommend certain vaccines, such as: Influenza vaccine. This is recommended every year. Tetanus, diphtheria, and acellular pertussis (Tdap, Td) vaccine. You may need a Td booster every 10 years. Zoster vaccine. You may need this after age 52. Pneumococcal 13-valent conjugate (PCV13) vaccine. One dose is recommended after age 6. Pneumococcal polysaccharide (PPSV23) vaccine. One dose is recommended after age 63. Talk to your health care provider about which screenings and vaccines you need and how often you need them. This information is not intended to replace advice given to you by your health care provider. Make sure you discuss any questions you have with your health care provider. Document Released: 11/30/2015 Document Revised: 07/23/2016 Document Reviewed: 09/04/2015 Elsevier Interactive Patient Education  2017 [employer].  Fall Prevention in the Home Falls can cause injuries. They can happen to people of all ages. There are many things you can do to make your home safe and to help prevent falls. What can I do on the outside of my home? Regularly fix the edges of walkways and driveways and fix any cracks. Remove anything that might make you trip as you walk through a door, such as a raised step or threshold. Trim any bushes or trees on the path to your home. Use bright outdoor lighting. Clear any walking paths of anything that might make someone trip, such as rocks or tools. Regularly check to see if handrails are loose or broken. Make sure that both sides of any steps have handrails. Any raised decks and porches should have guardrails on the edges. Have any leaves, snow, or ice cleared regularly. Use sand or salt on walking paths during winter. Clean up any spills in your garage right away. This includes oil or grease spills. What can I do in the bathroom? Use night lights. Install grab bars by the toilet and in the tub and shower. Do not use towel  bars as grab bars. Use non-skid mats or decals in the tub or shower. If you need to sit down in the shower, use a plastic, non-slip stool. Keep the floor dry. Clean up any water that spills on the floor as soon as it happens. Remove soap buildup in the tub or shower regularly. Attach bath mats securely with double-sided non-slip rug tape. Do not have throw rugs and other things on the floor that can make you trip. What can I do in the bedroom? Use night lights. Make sure that you have a light by your bed that is easy to reach. Do not use any sheets or blankets that are too big for your bed. They should not hang down onto the floor. Have a firm chair that has side arms. You can use this for support while you get dressed. Do not have throw rugs and other things on the floor that can make you trip. What can I do in the kitchen? Clean up any spills right away. Avoid walking on wet floors. Keep items that you use a lot in easy-to-reach places. If you need to reach something above you, use a strong step stool that has a grab bar. Keep electrical cords out of the way. Do not use floor polish or wax that makes  floors slippery. If you must use wax, use non-skid floor wax. Do not have throw rugs and other things on the floor that can make you trip. What can I do with my stairs? Do not leave any items on the stairs. Make sure that there are handrails on both sides of the stairs and use them. Fix handrails that are broken or loose. Make sure that handrails are as Petrey as the stairways. Check any carpeting to make sure that it is firmly attached to the stairs. Fix any carpet that is loose or worn. Avoid having throw rugs at the top or bottom of the stairs. If you do have throw rugs, attach them to the floor with carpet tape. Make sure that you have a light switch at the top of the stairs and the bottom of the stairs. If you do not have them, ask someone to add them for you. What else can I do to help  prevent falls? Wear shoes that: Do not have high heels. Have rubber bottoms. Are comfortable and fit you well. Are closed at the toe. Do not wear sandals. If you use a stepladder: Make sure that it is fully opened. Do not climb a closed stepladder. Make sure that both sides of the stepladder are locked into place. Ask someone to hold it for you, if possible. Clearly mark and make sure that you can see: Any grab bars or handrails. First and last steps. Where the edge of each step is. Use tools that help you move around (mobility aids) if they are needed. These include: Canes. Walkers. Scooters. Crutches. Turn on the lights when you go into a dark area. Replace any light bulbs as soon as they burn out. Set up your furniture so you have a clear path. Avoid moving your furniture around. If any of your floors are uneven, fix them. If there are any pets around you, be aware of where they are. Review your medicines with your doctor. Some medicines can make you feel dizzy. This can increase your chance of falling. Ask your doctor what other things that you can do to help prevent falls. This information is not intended to replace advice given to you by your health care provider. Make sure you discuss any questions you have with your health care provider. Document Released: 08/30/2009 Document Revised: 04/10/2016 Document Reviewed: 12/08/2014 Elsevier Interactive Patient Education  2017 [employer].

## 2023-06-15 NOTE — Progress Notes (Signed)
Subjective:   Jennifer Sellers is a 77 y.o. female who presents for Medicare Annual (Subsequent) preventive examination.  Visit Complete: Virtual  I connected with  Jennifer Sellers on 06/15/23 by a audio enabled telemedicine application and verified that I am speaking with the correct person using two identifiers.  Patient Location: Home  Provider Location: Home Office  I discussed the limitations of evaluation and management by telemedicine. The patient expressed understanding and agreed to proceed.   Review of Systems      Cardiac Risk Factors include: advanced age (>44men, >33 women);sedentary lifestyle;dyslipidemia  Vital Signs: Unable to obtain new vitals due to this being a telehealth visit.     Objective:    Today's Vitals   06/15/23 0941  Weight: 130 lb (59 kg)  Height: 5\' 7"  (1.702 m)   Body mass index is 20.36 kg/m.     06/15/2023    9:55 AM 09/25/2022    9:09 AM 06/11/2022   10:35 AM 05/28/2021    1:41 PM 10/31/2020    9:32 AM 10/05/2020    7:15 AM 09/28/2020   11:05 AM  Advanced Directives  Does Patient Have a Medical Advance Directive? No No No Yes Yes Yes No  Type of Aeronautical engineer of [place];Living will Healthcare Power of [place];Living will    Does patient want to make changes to medical advance directive?      No - Patient declined   Copy of Healthcare Power of Attorney in Chart?    No - copy requested     Would patient like information on creating a medical advance directive? No - Patient declined No - Patient declined No - Patient declined   No - Patient declined No - Patient declined    Current Medications (verified) Outpatient Encounter Medications as of 06/15/2023  Medication Sig   folic acid (FOLVITE) 1 MG tablet Take 2 mg by mouth daily.   levothyroxine (SYNTHROID) 75 MCG tablet Take 1 tablet (75 mcg total) by mouth daily before breakfast.   methotrexate (RHEUMATREX) 2.5 MG tablet Take 1 tablet (2.5 mg total) by mouth  once a week. Take 1 pill (2.5mg ) (Methotrexate) by mouth with the 15mg  tab (Trexall) for a total dose of 17.5mg , once a week on Sunday  Caution:Chemotherapy. Protect from light.   methotrexate (TREXALL) 15 MG tablet Take 1 tablet (15 mg total) by mouth once a week. Take 1 pill (15 mg) by mouth with the 2.5 mg tab (of Methotrexate) for a total dose of 17.5mg , once a week on Sunday.  Caution:Chemotherapy. Protect from light.   Multiple Vitamins-Minerals (PRESERVISION AREDS 2 PO) Take 2 capsules by mouth daily.    NON FORMULARY 2 drops daily. sustane   Prenatal Multivit w/ Iron-FA (NEONATAL FE PO) Take by mouth.   tamoxifen (NOLVADEX) 20 MG tablet Take 1 tablet (20 mg total) by mouth daily.   triamcinolone cream (KENALOG) 0.5 % Apply 1 Application topically 2 (two) times daily.   Biotin 1 MG CAPS  (Patient not taking: Reported on 06/15/2023)   Cholecalciferol (OPTIMAL-D PO) Take 1 tablet by mouth daily. (Patient not taking: Reported on 06/15/2023)   Cholecalciferol 125 MCG (5000 UT) TABS  (Patient not taking: Reported on 06/15/2023)   OVER THE COUNTER MEDICATION Optimal longevi D K2 (Patient not taking: Reported on 06/15/2023)   No facility-administered encounter medications on file as of 06/15/2023.    Allergies (verified) Misc. sulfonamide containing compounds, Cipro [ciprofloxacin hcl], Cortisporin [bacitra-neomycin-polymyxin-hc], Prevnar [  pneumococcal 13-val conj vacc], Sulfa antibiotics, and Sulfonamide derivatives   History: Past Medical History:  Diagnosis Date   Arthritis    rheumatoid arthritis   Hypothyroidism    Leukopenia    Thyroid disease    TIA (transient ischemic attack) 2011   Past Surgical History:  Procedure Laterality Date   ABDOMINAL HYSTERECTOMY     BREAST LUMPECTOMY WITH RADIOACTIVE SEED AND SENTINEL LYMPH NODE BIOPSY Left 10/05/2020   Procedure: LEFT BREAST LUMPECTOMY WITH RADIOACTIVE SEED AND SENTINEL LYMPH NODE BIOPSY;  Surgeon: Griselda Miner, MD;  Location: [facility];  Service: General;  Laterality: Left;  GENERAL AND PECTORAL BLOCK   CARPAL TUNNEL RELEASE     COLONOSCOPY     COLONOSCOPY WITH PROPOFOL N/A 06/30/2018   Procedure: COLONOSCOPY WITH PROPOFOL;  Surgeon: Scot Jun, MD;  Location: [facility] ENDOSCOPY;  Service: Endoscopy;  Laterality: N/A;   colpo-ureteroscopy     lens surgery     Family History  Problem Relation Age of Onset   Cancer Mother        lymphoma   Cancer Brother        lymphoma   Cancer Daughter        NHL   Social History   Socioeconomic History   Marital status: Widowed    Spouse name: Not on file   Number of children: 2   Years of education: Not on file   Highest education level: Not on file  Occupational History   Not on file  Tobacco Use   Smoking status: Never   Smokeless tobacco: Never  Vaping Use   Vaping status: Never Used  Substance and Sexual Activity   Alcohol use: Yes    Alcohol/week: 0.0 standard drinks of alcohol    Comment: occasional   Drug use: No   Sexual activity: Yes  Other Topics Concern   Not on file  Social History Narrative   Not on file   Social Determinants of Health   Financial Resource Strain: Low Risk  (06/15/2023)   Overall Financial Resource Strain (CARDIA)    Difficulty of Paying Living Expenses: Not hard at all  Food Insecurity: No Food Insecurity (06/15/2023)   Hunger Vital Sign    Worried About Running Out of Food in the Last Year: Never true    Ran Out of Food in the Last Year: Never true  Transportation Needs: No Transportation Needs (06/15/2023)   PRAPARE - Administrator, Civil Service (Medical): No    Lack of Transportation (Non-Medical): No  Physical Activity: Inactive (06/15/2023)   Exercise Vital Sign    Days of Exercise per Week: 0 days    Minutes of Exercise per Session: 0 min  Stress: No Stress Concern Present (06/15/2023)   [employer] of Occupational Health - Occupational Stress Questionnaire    Feeling of Stress :  Only a little  Social Connections: Moderately Integrated (06/15/2023)   Social Connection and Isolation Panel [NHANES]    Frequency of Communication with Friends and Family: More than three times a week    Frequency of Social Gatherings with Friends and Family: More than three times a week    Attends Religious Services: More than 4 times per year    Active Member of [employer] or Organizations: Yes    Attends Banker Meetings: More than 4 times per year    Marital Status: Widowed    Tobacco Counseling Counseling given: Not Answered   Clinical Intake:  Pre-visit preparation completed: Yes  Pain : No/denies pain     BMI - recorded: 20.36 Nutritional Status: BMI of 19-24  Normal Nutritional Risks: None Diabetes: No  How often do you need to have someone help you when you read instructions, pamphlets, or other written materials from your doctor or pharmacy?: 1 - Never  Interpreter Needed?: No  Information entered by :: C.Remi Rester LPN   Activities of Daily Living    06/15/2023    9:57 AM 09/25/2022    9:07 AM  In your present state of health, do you have any difficulty performing the following activities:  Hearing? 0 0  Vision? 0 0  Difficulty concentrating or making decisions? 1 0  Comment occasionally forgets   Walking or climbing stairs? 0 0  Dressing or bathing? 0 0  Doing errands, shopping? 0   Preparing Food and eating ? N   Using the Toilet? N   In the past six months, have you accidently leaked urine? N   Do you have problems with loss of bowel control? N   Managing your Medications? N   Managing your Finances? N   Housekeeping or managing your Housekeeping? N     Patient Care Team: Tower, Audrie Gallus, MD as PCP - General Lad, Julien Girt, MD as Referring Physician (Ophthalmology) Galen Manila, MD as Referring Physician (Ophthalmology) Breck Coons, DC as Consulting Physician (Chiropractic Medicine) Pershing Proud, RN as Oncology Nurse  Navigator Donnelly Angelica, RN as Oncology Nurse Navigator Griselda Miner, MD as Consulting Physician (General Surgery) Malachy Mood, MD as Consulting Physician (Hematology) Dorothy Puffer, MD as Consulting Physician (Radiation Oncology) Pollyann Samples, NP as Nurse Practitioner (Nurse Practitioner) Irving Copas, Lavon Paganini., MD as Consulting Physician (Hematology and Oncology)  Indicate any recent Medical Services you may have received from other than [facility] providers in the past year (date may be approximate).     Assessment:   This is a routine wellness examination for [place].  Hearing/Vision screen Hearing Screening - Comments:: Wears aids Vision Screening - Comments:: Glasses - [facility] - UTD on eye exams Dr.Ladd  Dietary issues and exercise activities discussed:     Goals Addressed             This Visit's Progress    Patient Stated       Stay active       Depression Screen    06/15/2023    9:53 AM 02/18/2023   12:59 PM 06/11/2022   10:33 AM 05/28/2021    1:42 PM 05/30/2020    8:33 AM 05/09/2019    9:46 AM 01/28/2018   10:30 AM  PHQ 2/9 Scores  PHQ - 2 Score 0 1 0 0 0 0 0  PHQ- 9 Score  4 0 0  0 0    Fall Risk    06/15/2023    9:56 AM 02/18/2023   12:59 PM 06/13/2022    9:01 AM 06/11/2022   10:36 AM 05/28/2021    1:41 PM  Fall Risk   Falls in the past year? 0 0 0 0 0  Number falls in past yr: 0 0  0 0  Injury with Fall? 0 0  0 0  Risk for fall due to : No Fall Risks No Fall Risks  No Fall Risks Medication side effect  Follow up Falls prevention discussed;Falls evaluation completed Falls evaluation completed Falls evaluation completed Falls evaluation completed Falls evaluation completed;Falls prevention discussed    MEDICARE RISK AT HOME:  Medicare Risk at Home - 06/15/23 0957     Any stairs in or around the home? Yes    If so, are there any without handrails? No    Home free of loose throw rugs in walkways, pet beds, electrical cords, etc? Yes    Adequate lighting  in your home to reduce risk of falls? Yes    Life alert? No    Use of a cane, walker or w/c? No    Grab bars in the bathroom? Yes    Shower chair or bench in shower? Yes    Elevated toilet seat or a handicapped toilet? No             TIMED UP AND GO:  Was the test performed?  No    Cognitive Function:    05/28/2021    1:43 PM 05/09/2019    9:54 AM 01/28/2018   10:31 AM 04/22/2017    9:59 AM 05/15/2016    8:52 AM  MMSE - Mini Mental State Exam  Orientation to time 5 5 5 5 5   Orientation to Place 5 5 5 5 5   Registration 3 3 3 3 3   Attention/ Calculation 5 0 0 0 0  Recall 3 3 3 3 3   Language- name 2 objects  0 0 0 0  Language- repeat 1 1 1 1 1   Language- follow 3 step command  0 3 3 3   Language- read & follow direction  0 0 0 0  Write a sentence  0 0 0 0  Copy design  0 0 0 0  Total score  17 20 20 20         06/15/2023    9:58 AM 06/11/2022   10:38 AM  6CIT Screen  What Year? 0 points 0 points  What month? 0 points 0 points  What time? 0 points 0 points  Count back from 20 0 points 0 points  Months in reverse 0 points 0 points  Repeat phrase 2 points 0 points  Total Score 2 points 0 points    Immunizations Immunization History  Administered Date(s) Administered   PFIZER(Purple Top)SARS-COV-2 Vaccination 12/07/2019, 12/28/2019, 10/31/2020   Pneumococcal Conjugate-13 05/15/2015   Td 04/06/2007, 02/02/2018    TDAP status: Up to date  Flu Vaccine status: Up to date  Pneumococcal vaccine status: Declined,  Education has been provided regarding the importance of this vaccine but patient still declined. Advised may receive this vaccine at local pharmacy or Health Dept. Aware to provide a copy of the vaccination record if obtained from local pharmacy or Health Dept. Verbalized acceptance and understanding.   Covid-19 vaccine status: Declined, Education has been provided regarding the importance of this vaccine but patient still declined. Advised may receive this  vaccine at local pharmacy or Health Dept.or vaccine clinic. Aware to provide a copy of the vaccination record if obtained from local pharmacy or Health Dept. Verbalized acceptance and understanding.  Qualifies for Shingles Vaccine? Yes   Zostavax completed  allergic   Shingrix Completed?: No.    Education has been provided regarding the importance of this vaccine. Patient has been advised to call insurance company to determine out of pocket expense if they have not yet received this vaccine. Advised may also receive vaccine at local pharmacy or Health Dept. Verbalized acceptance and understanding.  Screening Tests Health Maintenance  Topic Date Due   COVID-19 Vaccine (4 - 2023-24 season) 07/18/2022   Zoster Vaccines- Shingrix (1 of 2) 06/10/2031 (Originally 02/24/1965)  INFLUENZA VACCINE  06/18/2023   MAMMOGRAM  07/23/2023   Medicare Annual Wellness (AWV)  06/14/2024   DTaP/Tdap/Td (3 - Tdap) 02/03/2028   DEXA SCAN  Completed   Hepatitis C Screening  Completed   HPV VACCINES  Aged Out   Pneumonia Vaccine 65+ Years old  Discontinued   Colonoscopy  Discontinued    Health Maintenance  Health Maintenance Due  Topic Date Due   COVID-19 Vaccine (4 - 2023-24 season) 07/18/2022    Colorectal cancer screening: No longer required.   Mammogram status: Ordered 06/15/23. Pt provided with contact info and advised to call to schedule appt.   Bone Density status: Ordered 06/15/23. Pt provided with contact info and advised to call to schedule appt.  Lung Cancer Screening: (Low Dose CT Chest recommended if Age 39-80 years, 20 pack-year currently smoking OR have quit w/in 15years.) does not qualify.   Lung Cancer Screening Referral: n/a  Additional Screening:  Hepatitis C Screening: does qualify; Completed 08/18/22   Vision Screening: Recommended annual ophthalmology exams for early detection of glaucoma and other disorders of the eye. Is the patient up to date with their annual eye exam?   Yes  Who is the provider or what is the name of the office in which the patient attends annual eye exams? [facility] If pt is not established with a provider, would they like to be referred to a provider to establish care? Yes .   Dental Screening: Recommended annual dental exams for proper oral hygiene    Community Resource Referral / Chronic Care Management: CRR required this visit?  No   CCM required this visit?  No     Plan:     I have personally reviewed and noted the following in the patient's chart:   Medical and social history Use of alcohol, tobacco or illicit drugs  Current medications and supplements including opioid prescriptions. Patient is not currently taking opioid prescriptions. Functional ability and status Nutritional status Physical activity Advanced directives List of other physicians Hospitalizations, surgeries, and ER visits in previous 12 months Vitals Screenings to include cognitive, depression, and falls Referrals and appointments  In addition, I have reviewed and discussed with patient certain preventive protocols, quality metrics, and best practice recommendations. A written personalized care plan for preventive services as well as general preventive health recommendations were provided to patient.     Maryan Puls, LPN   03/11/9562   After Visit Summary: (MyChart) Due to this being a telephonic visit, the after visit summary with patients personalized plan was offered to patient via MyChart   Nurse Notes: none

## 2023-06-16 ENCOUNTER — Encounter: Payer: Medicare Other | Admitting: Family Medicine

## 2023-06-21 ENCOUNTER — Telehealth: Payer: Self-pay | Admitting: Family Medicine

## 2023-06-21 DIAGNOSIS — D72819 Decreased white blood cell count, unspecified: Secondary | ICD-10-CM

## 2023-06-21 DIAGNOSIS — D696 Thrombocytopenia, unspecified: Secondary | ICD-10-CM

## 2023-06-21 DIAGNOSIS — E559 Vitamin D deficiency, unspecified: Secondary | ICD-10-CM

## 2023-06-21 DIAGNOSIS — E039 Hypothyroidism, unspecified: Secondary | ICD-10-CM

## 2023-06-21 DIAGNOSIS — E7849 Other hyperlipidemia: Secondary | ICD-10-CM

## 2023-06-21 NOTE — Telephone Encounter (Signed)
-----   Message from Lovena Neighbours sent at 06/04/2023 12:42 PM EDT ----- Regarding: Labs 8.5.24 Please put physical lab orders in future. Thank you, Denny Peon

## 2023-06-22 ENCOUNTER — Other Ambulatory Visit (INDEPENDENT_AMBULATORY_CARE_PROVIDER_SITE_OTHER): Payer: Medicare Other

## 2023-06-22 ENCOUNTER — Telehealth: Payer: Self-pay | Admitting: *Deleted

## 2023-06-22 DIAGNOSIS — E559 Vitamin D deficiency, unspecified: Secondary | ICD-10-CM | POA: Diagnosis not present

## 2023-06-22 DIAGNOSIS — E039 Hypothyroidism, unspecified: Secondary | ICD-10-CM

## 2023-06-22 DIAGNOSIS — E7849 Other hyperlipidemia: Secondary | ICD-10-CM

## 2023-06-22 DIAGNOSIS — D72819 Decreased white blood cell count, unspecified: Secondary | ICD-10-CM | POA: Diagnosis not present

## 2023-06-22 DIAGNOSIS — D696 Thrombocytopenia, unspecified: Secondary | ICD-10-CM | POA: Diagnosis not present

## 2023-06-22 LAB — CBC WITH DIFFERENTIAL/PLATELET
Basophils Absolute: 0 10*3/uL (ref 0.0–0.1)
Basophils Relative: 1.1 % (ref 0.0–3.0)
Eosinophils Absolute: 0 10*3/uL (ref 0.0–0.7)
Eosinophils Relative: 1.8 % (ref 0.0–5.0)
HCT: 32.3 % — ABNORMAL LOW (ref 36.0–46.0)
Hemoglobin: 11.1 g/dL — ABNORMAL LOW (ref 12.0–15.0)
Lymphocytes Relative: 50.1 % — ABNORMAL HIGH (ref 12.0–46.0)
Lymphs Abs: 0.9 10*3/uL (ref 0.7–4.0)
MCHC: 34.4 g/dL (ref 30.0–36.0)
MCV: 98.5 fl (ref 78.0–100.0)
Monocytes Absolute: 0.1 10*3/uL (ref 0.1–1.0)
Monocytes Relative: 5.5 % (ref 3.0–12.0)
Neutro Abs: 0.7 10*3/uL — ABNORMAL LOW (ref 1.4–7.7)
Neutrophils Relative %: 41.5 % — ABNORMAL LOW (ref 43.0–77.0)
Platelets: 126 10*3/uL — ABNORMAL LOW (ref 150.0–400.0)
RBC: 3.28 Mil/uL — ABNORMAL LOW (ref 3.87–5.11)
RDW: 14.3 % (ref 11.5–15.5)
WBC: 1.7 10*3/uL — CL (ref 4.0–10.5)

## 2023-06-22 LAB — TSH: TSH: 1.28 u[IU]/mL (ref 0.35–5.50)

## 2023-06-22 LAB — COMPREHENSIVE METABOLIC PANEL
ALT: 7 U/L (ref 0–35)
AST: 15 U/L (ref 0–37)
Albumin: 3.8 g/dL (ref 3.5–5.2)
Alkaline Phosphatase: 54 U/L (ref 39–117)
BUN: 14 mg/dL (ref 6–23)
CO2: 27 mEq/L (ref 19–32)
Calcium: 8.3 mg/dL — ABNORMAL LOW (ref 8.4–10.5)
Chloride: 108 mEq/L (ref 96–112)
Creatinine, Ser: 1.04 mg/dL (ref 0.40–1.20)
GFR: 51.19 mL/min — ABNORMAL LOW (ref >60.00)
Glucose, Bld: 89 mg/dL (ref 70–99)
Potassium: 4.3 mEq/L (ref 3.5–5.1)
Sodium: 142 mEq/L (ref 135–145)
Total Bilirubin: 1.3 mg/dL — ABNORMAL HIGH (ref 0.2–1.2)
Total Protein: 5.9 g/dL — ABNORMAL LOW (ref 6.0–8.3)

## 2023-06-22 LAB — LIPID PANEL
Cholesterol: 178 mg/dL (ref 0–200)
HDL: 57.3 mg/dL (ref >39.00)
LDL Cholesterol: 105 mg/dL — ABNORMAL HIGH (ref 0–99)
NonHDL: 120.85
Total CHOL/HDL Ratio: 3
Triglycerides: 78 mg/dL (ref 0.0–149.0)
VLDL: 15.6 mg/dL (ref 0.0–40.0)

## 2023-06-22 LAB — VITAMIN D 25 HYDROXY (VIT D DEFICIENCY, FRACTURES): VITD: 30.33 ng/mL (ref 30.00–100.00)

## 2023-06-22 NOTE — Telephone Encounter (Signed)
Aware, she sees hematology for this Will discuss at her visit

## 2023-06-22 NOTE — Telephone Encounter (Signed)
Hope with [facility] called with critical results.  Pt's WBC count is 1.7  PCP notified and placed info in critical red book

## 2023-06-24 ENCOUNTER — Ambulatory Visit (INDEPENDENT_AMBULATORY_CARE_PROVIDER_SITE_OTHER): Payer: Medicare Other | Admitting: Family Medicine

## 2023-06-24 ENCOUNTER — Encounter: Payer: Self-pay | Admitting: Family Medicine

## 2023-06-24 VITALS — BP 124/68 | HR 78 | Temp 97.6°F | Ht 66.0 in | Wt 129.0 lb

## 2023-06-24 DIAGNOSIS — D61818 Other pancytopenia: Secondary | ICD-10-CM

## 2023-06-24 DIAGNOSIS — Z0000 Encounter for general adult medical examination without abnormal findings: Secondary | ICD-10-CM | POA: Diagnosis not present

## 2023-06-24 DIAGNOSIS — E7849 Other hyperlipidemia: Secondary | ICD-10-CM

## 2023-06-24 DIAGNOSIS — D696 Thrombocytopenia, unspecified: Secondary | ICD-10-CM

## 2023-06-24 DIAGNOSIS — E559 Vitamin D deficiency, unspecified: Secondary | ICD-10-CM

## 2023-06-24 DIAGNOSIS — D72819 Decreased white blood cell count, unspecified: Secondary | ICD-10-CM

## 2023-06-24 DIAGNOSIS — C91Z0 Other lymphoid leukemia not having achieved remission: Secondary | ICD-10-CM

## 2023-06-24 DIAGNOSIS — Z1211 Encounter for screening for malignant neoplasm of colon: Secondary | ICD-10-CM

## 2023-06-24 DIAGNOSIS — E039 Hypothyroidism, unspecified: Secondary | ICD-10-CM

## 2023-06-24 DIAGNOSIS — M069 Rheumatoid arthritis, unspecified: Secondary | ICD-10-CM | POA: Diagnosis not present

## 2023-06-24 DIAGNOSIS — M8589 Other specified disorders of bone density and structure, multiple sites: Secondary | ICD-10-CM

## 2023-06-24 NOTE — Assessment & Plan Note (Signed)
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

## 2023-06-24 NOTE — Assessment & Plan Note (Signed)
Cologuard ordered Last colonoscopy was 10 y ago

## 2023-06-24 NOTE — Assessment & Plan Note (Signed)
From LGLL and RA Last plt ct 126 On methotrexate  Seeing heme Nanci Pina

## 2023-06-24 NOTE — Assessment & Plan Note (Signed)
Hypothyroidism  Pt has no clinical changes No change in energy level/ hair or skin/ edema and no tremor Lab Results  Component Value Date   TSH 1.28 06/22/2023     Plan to continue 75 mcg levothyroxine daily

## 2023-06-24 NOTE — Assessment & Plan Note (Signed)
Seeing heme/onc  Fracture with LGLL  On methotrexate

## 2023-06-24 NOTE — Progress Notes (Signed)
Subjective:    Patient ID: Jennifer Sellers, female    DOB: 03/30/1946, 77 y.o.   MRN: [id number]  HPI  Here for health maintenance exam and to review chronic medical problems   Wt Readings from Last 3 Encounters:  06/24/23 129 lb (58.5 kg)  06/15/23 130 lb (59 kg)  05/28/23 131 lb 6 oz (59.6 kg)   20.82 kg/m  Vitals:   06/24/23 0838  BP: 124/68  Pulse: 78  Temp: 97.6 F (36.4 C)  SpO2: 98%    Immunization History  Administered Date(s) Administered   PFIZER(Purple Top)SARS-COV-2 Vaccination 12/07/2019, 12/28/2019, 10/31/2020   Pneumococcal Conjugate-13 05/15/2015   Td 04/06/2007, 02/02/2018    There are no preventive care reminders to display for this patient.  Shingrix - declined in past   Declines flu shot   Had rxn to pneumonia vaccine in the past    Mammogram was 07/2022 and next one is ordered -she will call to schedule  Personal history of breast cancer  Taking tamoxifen  Self breast exam: no lumps but does not do exam regularly   Gyn health: no problems    Colon cancer screening -colonoscopy 06/2013- no recall due to age    Dexa  06/2021 - order placed for next  Osteopenia  Does take tamoxifen Falls: none Fractures:none  Supplements  Last vitamin D Lab Results  Component Value Date   VD25OH 30.33 06/22/2023   Exercise : weeding/ mowing  Wants to start back walking      Mood    06/15/2023    9:53 AM 02/18/2023   12:59 PM 06/11/2022   10:33 AM 05/28/2021    1:42 PM 05/30/2020    8:33 AM  Depression screen PHQ 2/9  Decreased Interest 0 0 0 0 0  Down, Depressed, Hopeless 0 1 0 0 0  PHQ - 2 Score 0 1 0 0 0  Altered sleeping  0 0 0   Tired, decreased energy  3 0 0   Change in appetite  0 0 0   Feeling bad or failure about yourself   0 0 0   Trouble concentrating  0 0 0   Moving slowly or fidgety/restless  0 0 0   Suicidal thoughts  0 0 0   PHQ-9 Score  4 0 0   Difficult doing work/chores  Somewhat difficult Not difficult at all Not  difficult at all    Hypothyroidism  Pt has no clinical changes No change in energy level/ hair or skin/ edema and no tremor Lab Results  Component Value Date   TSH 1.28 06/22/2023    Levothyroxine 75 mcg daily   History of RA - this worsens her low wbc and platelet ct Taking methotrexate per hematology 17.5 mg weekly  Also has culture of large granular lymphocyte leukemia (also sees sub specialist at [facility])  Lab Results  Component Value Date   WBC 1.7 Repeated and verified X2. (LL) 06/22/2023   HGB 11.1 (L) 06/22/2023   HCT 32.3 (L) 06/22/2023   MCV 98.5 06/22/2023   PLT 126.0 (L) 06/22/2023   Baseline    Hyperlipidemia  Lab Results  Component Value Date   CHOL 178 06/22/2023   CHOL 189 06/09/2022   CHOL 183 05/28/2021   Lab Results  Component Value Date   HDL 57.30 06/22/2023   HDL 50.70 06/09/2022   HDL 57.30 05/28/2021   Lab Results  Component Value Date   LDLCALC 105 (H) 06/22/2023   LDLCALC 119 (  H) 06/09/2022   LDLCALC 115 (H) 05/28/2021   Lab Results  Component Value Date   TRIG 78.0 06/22/2023   TRIG 100.0 06/09/2022   TRIG 58.0 05/28/2021   Lab Results  Component Value Date   CHOLHDL 3 06/22/2023   CHOLHDL 4 06/09/2022   CHOLHDL 3 05/28/2021   Lab Results  Component Value Date   LDLDIRECT 163.4 11/11/2013   LDLDIRECT 152.5 08/14/2011   LDLDIRECT 145.0 04/27/2007   Diet controlled  Has been eating better overall   Lab Results  Component Value Date   NA 142 06/22/2023   K 4.3 06/22/2023   CO2 27 06/22/2023   GLUCOSE 89 06/22/2023   BUN 14 06/22/2023   CREATININE 1.04 06/22/2023   CALCIUM 8.3 (L) 06/22/2023   GFR 51.19 (L) 06/22/2023   GFRNONAA 54 (L) 05/26/2023   Lab Results  Component Value Date   ALT 7 06/22/2023   AST 15 06/22/2023   ALKPHOS 54 06/22/2023   BILITOT 1.3 (H) 06/22/2023       Patient Active Problem List   Diagnosis Date Noted   Colon cancer screening 06/24/2023   Allergic dermatitis 05/28/2023   Vision  changes 02/18/2023   Cognitive decline 02/18/2023   Grief 02/18/2023   Short-term memory loss 02/18/2023   Fatigue 02/18/2023   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   Other pancytopenia (HCC) 09/10/2022   Malignant neoplasm of upper-outer quadrant of left breast in female, 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   Estrogen deficiency 05/24/2019   Hx of adenomatous colonic polyps 03/11/2018   Vitamin D deficiency 08/03/2015   Routine general medical examination at a health care facility 05/15/2015   Rheumatoid arthritis (HCC) 06/19/2014   Thrombocytopenia (HCC) 06/19/2014   Encounter for Medicare annual wellness exam 11/11/2013   Hyperbilirubinemia 08/19/2013   Hypothyroid 09/21/2012   GOUTY TOPHI 10/23/2010   OTHER SYMPTOMS REFERABLE JOINT SITE UNSPECIFIED 10/23/2010   CARPAL TUNNEL SYNDROME, HX OF 04/05/2009   Leukocytopenia 05/02/2008   INSOMNIA, TRANSIENT 04/27/2007   Osteopenia 04/27/2007   Hyperlipidemia 04/23/2007   HIATAL HERNIA 04/23/2007   POSTMENOPAUSAL STATUS 04/23/2007   RHEUMATOID FACTOR, POSITIVE 04/23/2007   Past Medical History:  Diagnosis Date   Arthritis    rheumatoid arthritis   Hypothyroidism    Leukopenia    Thyroid disease    TIA (transient ischemic attack) 2011   Past Surgical History:  Procedure Laterality Date   ABDOMINAL HYSTERECTOMY     BREAST LUMPECTOMY WITH RADIOACTIVE SEED AND SENTINEL LYMPH NODE BIOPSY Left 10/05/2020   Procedure: LEFT BREAST LUMPECTOMY WITH RADIOACTIVE SEED AND SENTINEL LYMPH NODE BIOPSY;  Surgeon: Griselda Miner, MD;  Location: [facility];  Service: General;  Laterality: Left;  GENERAL AND PECTORAL BLOCK   CARPAL TUNNEL RELEASE     COLONOSCOPY     COLONOSCOPY WITH PROPOFOL N/A 06/30/2018   Procedure: COLONOSCOPY WITH PROPOFOL;  Surgeon: Scot Jun, MD;  Location: [facility] ENDOSCOPY;  Service: Endoscopy;  Laterality: N/A;    colpo-ureteroscopy     lens surgery     Social History   Tobacco Use   Smoking status: Never   Smokeless tobacco: Never  Vaping Use   Vaping status: Never Used  Substance Use Topics   Alcohol use: Yes    Alcohol/week: 0.0 standard drinks of alcohol    Comment: occasional   Drug use: No   Family History  Problem Relation Age of Onset   Cancer Mother  lymphoma   Cancer Brother        lymphoma   Cancer Daughter        NHL   Allergies  Allergen Reactions   Misc. Sulfonamide Containing Compounds Rash    REACTION: reaction  not known REACTION: reaction not known   Cipro [Ciprofloxacin Hcl] Other (See Comments)    Unknown    Cortisporin [Bacitra-Neomycin-Polymyxin-Hc]     Burning / swelling of eyes with eye drops   Prevnar [Pneumococcal 13-Val Conj Vacc] Other (See Comments)    Severe local reaction    Sulfa Antibiotics    Sulfonamide Derivatives     REACTION: reaction not known   Current Outpatient Medications on File Prior to Visit  Medication Sig Dispense Refill   folic acid (FOLVITE) 1 MG tablet Take 2 mg by mouth daily.     levothyroxine (SYNTHROID) 75 MCG tablet Take 1 tablet (75 mcg total) by mouth daily before breakfast. 90 tablet 0   methotrexate (RHEUMATREX) 2.5 MG tablet Take 1 tablet (2.5 mg total) by mouth once a week. Take 1 pill (2.5mg ) (Methotrexate) by mouth with the 15mg  tab (Trexall) for a total dose of 17.5mg , once a week on Sunday  Caution:Chemotherapy. Protect from light. 8 tablet 1   methotrexate (TREXALL) 15 MG tablet Take 1 tablet (15 mg total) by mouth once a week. Take 1 pill (15 mg) by mouth with the 2.5 mg tab (of Methotrexate) for a total dose of 17.5mg , once a week on Sunday.  Caution:Chemotherapy. Protect from light. 8 tablet 1   Multiple Vitamins-Minerals (PRESERVISION AREDS 2 PO) Take 2 capsules by mouth daily.      NON FORMULARY 2 drops daily. sustane     Prenatal Multivit w/ Iron-FA (NEONATAL FE PO) Take by mouth.     tamoxifen  (NOLVADEX) 20 MG tablet Take 1 tablet (20 mg total) by mouth daily. 90 tablet 1   triamcinolone cream (KENALOG) 0.5 % Apply 1 Application topically 2 (two) times daily. 15 g 0   No current facility-administered medications on file prior to visit.    Review of Systems  Constitutional:  Positive for fatigue. Negative for activity change, appetite change, fever and unexpected weight change.  HENT:  Negative for congestion, ear pain, rhinorrhea, sinus pressure and sore throat.   Eyes:  Negative for pain, redness and visual disturbance.  Respiratory:  Negative for cough, shortness of breath and wheezing.   Cardiovascular:  Negative for chest pain and palpitations.  Gastrointestinal:  Negative for abdominal pain, blood in stool, constipation and diarrhea.  Endocrine: Negative for polydipsia and polyuria.  Genitourinary:  Negative for dysuria, frequency and urgency.  Musculoskeletal:  Negative for arthralgias, back pain and myalgias.       Joints have been ok lately  Skin:  Negative for pallor and rash.  Allergic/Immunologic: Negative for environmental allergies.  Neurological:  Negative for dizziness, syncope and headaches.  Hematological:  Negative for adenopathy. Does not bruise/bleed easily.  Psychiatric/Behavioral:  Positive for sleep disturbance. Negative for decreased concentration and dysphoric mood. The patient is not nervous/anxious.        Overall ok with grief   No changes in memory since last visit- struggles with this a bit (last MMSE was good)       Objective:   Physical Exam Constitutional:      General: She is not in acute distress.    Appearance: Normal appearance. She is well-developed and normal weight. She is not ill-appearing or diaphoretic.  Comments: slim  HENT:     Head: Normocephalic and atraumatic.     Right Ear: Tympanic membrane, ear canal and external ear normal.     Left Ear: Tympanic membrane, ear canal and external ear normal.     Nose: Nose normal.  No congestion.     Mouth/Throat:     Mouth: Mucous membranes are moist.     Pharynx: Oropharynx is clear. No posterior oropharyngeal erythema.  Eyes:     General: No scleral icterus.    Extraocular Movements: Extraocular movements intact.     Conjunctiva/sclera: Conjunctivae normal.     Pupils: Pupils are equal, round, and reactive to light.  Neck:     Thyroid: No thyromegaly.     Vascular: No carotid bruit or JVD.  Cardiovascular:     Rate and Rhythm: Normal rate and regular rhythm.     Pulses: Normal pulses.     Heart sounds: Normal heart sounds.     No gallop.  Pulmonary:     Effort: Pulmonary effort is normal. No respiratory distress.     Breath sounds: Normal breath sounds. No wheezing.     Comments: Good air exch Chest:     Chest wall: No tenderness.  Abdominal:     General: Bowel sounds are normal. There is no distension or abdominal bruit.     Palpations: Abdomen is soft. There is no mass.     Tenderness: There is no abdominal tenderness.     Hernia: No hernia is present.  Genitourinary:    Comments: Breast exam: No mass, nodules, thickening, tenderness, bulging, retraction, inflamation, nipple discharge or skin changes noted.  No axillary or clavicular LA.     Musculoskeletal:        General: No tenderness. Normal range of motion.     Cervical back: Normal range of motion and neck supple. No rigidity. No muscular tenderness.     Right lower leg: No edema.     Left lower leg: No edema.     Comments: No kyphosis  Posture is fairly good   Lymphadenopathy:     Cervical: No cervical adenopathy.  Skin:    General: Skin is warm and dry.     Coloration: Skin is not pale.     Findings: No erythema or rash.  Neurological:     Mental Status: She is alert. Mental status is at baseline.     Cranial Nerves: No cranial nerve deficit.     Motor: No abnormal muscle tone.     Coordination: Coordination normal.     Gait: Gait normal.     Deep Tendon Reflexes: Reflexes are  normal and symmetric. Reflexes normal.  Psychiatric:        Mood and Affect: Mood normal.        Cognition and Memory: Cognition and memory normal.     Comments: Dealing ok with grief            Assessment & Plan:   Problem List Items Addressed This Visit       Endocrine   Hypothyroid    Hypothyroidism  Pt has no clinical changes No change in energy level/ hair or skin/ edema and no tremor Lab Results  Component Value Date   TSH 1.28 06/22/2023     Plan to continue 75 mcg levothyroxine daily         Musculoskeletal and Integument   Rheumatoid arthritis (HCC)    On methotrexate now for this and LGLL  Osteopenia    Due for dexa  Goes to [facility]  Order was put in by amw nurse   She will schedule Disc need for calcium/ (diet)  vitamin D/ wt bearing exercise and bone density test every 2 y to monitor Disc safety/ fracture risk in detail   On tamoxifen as well        Hematopoietic and Hemostatic   Thrombocytopenia (HCC)    From LGLL and RA Last plt ct 126 On methotrexate  Seeing heme /onc      Other pancytopenia (HCC)    Seeing heme/onc  Fracture with LGLL  On methotrexate         Other   Vitamin D deficiency    Last vitamin D Lab Results  Component Value Date   VD25OH 30.33 06/22/2023   Encouraged to take at least 2000 international units D3 daily over the counter        Routine general medical examination at a health care facility - Primary    Reviewed health habits including diet and exercise and skin cancer prevention Reviewed appropriate screening tests for age  Also reviewed health mt list, fam hx and immunization status , as well as social and family history   See HPI Labs reviewed and ordered Declines shingrix and flu shot  Had local rxn to pna vaccine in past  Given # to schedule her mammogram and dexa (were ordered)  No gyn complaints  On tamoxifen/ no falls or fracture  Discussed goals for exercise/ nutrition and vit D  intake Cologuard ordered (last colonoscopy 10 y ago)  PHQ:0        Leukocytopenia    Dx with LGLL  Sees heme/onc  Taking methotrexate        Large granular lymphocytic leukemia (HCC)    Sees hematology here and at [facility] Last wbc 1.7  RA may play a role Reviewed last notes Taking methotrexate 17.5 mg weekly       Hyperlipidemia    Disc goals for lipids and reasons to control them Rev last labs with pt Rev low sat fat diet in detail Improved with better diet        Hyperbilirubinemia    Slightly improved 1.3 No clinical changes       Colon cancer screening    Cologuard ordered Last colonoscopy was 10 y ago       Relevant Orders   Cologuard

## 2023-06-24 NOTE — Assessment & Plan Note (Signed)
Sees hematology here and at [facility] Last wbc 1.7  RA may play a role Reviewed last notes Taking methotrexate 17.5 mg weekly

## 2023-06-24 NOTE — Assessment & Plan Note (Signed)
Due for dexa  Goes to [facility]  Order was put in by amw nurse   She will schedule Disc need for calcium/ (diet)  vitamin D/ wt bearing exercise and bone density test every 2 y to monitor Disc safety/ fracture risk in detail   On tamoxifen as well

## 2023-06-24 NOTE — Assessment & Plan Note (Signed)
Disc goals for lipids and reasons to control them Rev last labs with pt Rev low sat fat diet in detail  Improved with better diet  

## 2023-06-24 NOTE — Assessment & Plan Note (Signed)
Dx with LGLL  Sees heme/onc  Taking methotrexate

## 2023-06-24 NOTE — Assessment & Plan Note (Signed)
Last vitamin D Lab Results  Component Value Date   VD25OH 30.33 06/22/2023   Encouraged to take at least 2000 international units D3 daily over the counter

## 2023-06-24 NOTE — Assessment & Plan Note (Signed)
Reviewed health habits including diet and exercise and skin cancer prevention Reviewed appropriate screening tests for age  Also reviewed health mt list, fam hx and immunization status , as well as social and family history   See HPI Labs reviewed and ordered Declines shingrix and flu shot  Had local rxn to pna vaccine in past  Given # to schedule her mammogram and dexa (were ordered)  No gyn complaints  On tamoxifen/ no falls or fracture  Discussed goals for exercise/ nutrition and vit D intake Cologuard ordered (last colonoscopy 10 y ago)  PHQ:0

## 2023-06-24 NOTE — Assessment & Plan Note (Signed)
Slightly improved 1.3 No clinical changes

## 2023-06-24 NOTE — Patient Instructions (Addendum)
I ordered the cologuard test  If you don't hear from the company in 1-2 weeks , please give [place] a call   Go ahead and call to schedule your mammogram and bone density test   Stay active Continue walking when safe to walk Add some strength training to your routine, this is important for bone and brain health and can reduce your risk of falls and help your body use insulin properly and regulate weight  Light weights, exercise bands , and internet videos are a good way to start  Yoga (chair or regular), machines , floor exercises or a gym with machines are also good options    Make sure you get at least 2000 international units of vitamin D3 over the counter every day     You have an order for:  []   2D Mammogram  [x]   3D Mammogram  [x]   Bone Density     Please call for appointment:   []   [facility]  [address] [place] [address]  [phone]  []   [facility])   [address]  [place], [place] [address]  [phone]  []   [facility]      [address] [place], [place]        [phone]         [x]   [facility]  [address] [place], [place]  [phone]  []  [facility]   [address]   [place], [place] [address]  [phone]  []  [facility]  [address] [place], [place] [address] [phone]    Make sure to wear two piece clothing  No lotions powders or deodorants the day of the appointment Make sure to bring picture ID and insurance card.  Bring list of medications you are currently taking including any supplements.   Schedule your screening mammogram through MyChart!   Select [place] imaging sites can now be scheduled through MyChart.  Log into your MyChart account.  Go to 'Visit' (or  'Appointments' if  on mobile App) --> Schedule an  Appointment  Under 'Select a Reason for Visit' choose the Mammogram  Screening option.  Complete the pre-visit questions  and select the time and place that  best fits your schedule

## 2023-06-26 NOTE — Addendum Note (Signed)
Addended by: Shon Millet on: 06/26/2023 09:26 AM   Modules accepted: Orders

## 2023-07-07 ENCOUNTER — Encounter: Payer: Self-pay | Admitting: Family Medicine

## 2023-07-07 ENCOUNTER — Telehealth: Payer: Self-pay | Admitting: Family Medicine

## 2023-07-07 DIAGNOSIS — Z1211 Encounter for screening for malignant neoplasm of colon: Secondary | ICD-10-CM

## 2023-07-07 DIAGNOSIS — Z86010 Personal history of colonic polyps: Secondary | ICD-10-CM

## 2023-07-07 NOTE — Assessment & Plan Note (Signed)
Pt changed mind Wants to do colonoscopy instead of cologuard (is over 73)  Referral done  [facility]

## 2023-07-07 NOTE — Telephone Encounter (Signed)
Patient called in and had some questions and concerns regarding Cologuard and her colonoscopy. She can be reached at [phone]. Thank you!

## 2023-07-07 NOTE — Telephone Encounter (Signed)
Left VM letting pt know referral done and left [facility]'s phone # on VM

## 2023-07-07 NOTE — Telephone Encounter (Signed)
The referral is in  Please give her the # to call [facility] to schedule  Thanks

## 2023-07-07 NOTE — Telephone Encounter (Signed)
Called pt back she said during her appt with PCP she discussed getting a cologuard because she didn't want a colonoscopy done. Pt said she starting thinking about it and also talked to a family nurse that advise her that getting a colonoscopy is better because they will be able to check her full colon and take any polyps out if they find any and given her history of cancer that she should proceed with colonoscopy. Pt advise that as [facility] as she doesn't send the cologuard box back they don't bill her insurance. Pt will not do the cologuard and wants PCP to place an order for her to get the colonoscopy done she would like it done at [facility]

## 2023-07-14 ENCOUNTER — Telehealth: Payer: Self-pay | Admitting: *Deleted

## 2023-07-14 ENCOUNTER — Other Ambulatory Visit: Payer: Self-pay | Admitting: Hematology

## 2023-07-14 NOTE — Telephone Encounter (Signed)
Spoken to patient and notified her that screening for colonoscopy stop at the age of 66.   Patient stated that she did not know that and wants to check with her provider.  Inform patient that she would need an office visit with a provider due to age and health before scheduling a colonoscopy. It is high risk so this is require.  Patient stated that she will let me know what she decided.

## 2023-07-14 NOTE — Telephone Encounter (Signed)
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

## 2023-07-14 NOTE — Telephone Encounter (Addendum)
Will wait for patient to respond back on what she decided.  If yes, then patient with have to be schedule to be seen by one of our provider in the office before we can proceeded. Patient will need a new patient appointment.

## 2023-07-14 NOTE — Telephone Encounter (Signed)
Patient called in requesting a phone call regarding this.She said that she was never told there was an age limit to colonoscopies.

## 2023-07-14 NOTE — Telephone Encounter (Signed)
FYI: See prev note from GI

## 2023-07-15 ENCOUNTER — Telehealth: Payer: Self-pay

## 2023-07-15 ENCOUNTER — Other Ambulatory Visit: Payer: Self-pay

## 2023-07-15 MED ORDER — METHOTREXATE SODIUM 2.5 MG PO TABS: 2.5000 mg | ORAL_TABLET | ORAL | 1 refills | Status: DC

## 2023-07-15 MED ORDER — METHOTREXATE SODIUM 15 MG PO TABS: 15.0000 mg | ORAL_TABLET | ORAL | 1 refills | Status: DC

## 2023-07-15 NOTE — Telephone Encounter (Signed)
Pt has already spoke with oncology (see their phone note), pt said after talking to them that she decided to proceed with cologuard. She didn't realize how much of a "hassle" it would be to get a colonoscopy. Pt still has the cologuard box at home and will complete it

## 2023-07-15 NOTE — Telephone Encounter (Signed)
Patient called in regarding getting a colonoscopy. Relayed message from Dr. Milinda Antis regarding her discussing this with her oncologist to make sure its safe but patient would rather speak with Dr. Milinda Antis regarding this instead of her oncologist. Thank you!

## 2023-07-15 NOTE — Telephone Encounter (Signed)
Aware-will wait for result

## 2023-07-15 NOTE — Telephone Encounter (Signed)
Pt LVM regarding Folic Acid prescription and colonoscopy.  Returned pt's call.  Pt stated she's been taking 2 Folic Acid pills per day instead of 1 pill.  Pt stated she was told to take 2 pills.  Reviewed pt's chart and there is no record of pt being directed to take 2 Folic Acid pills daily.  Pt's prescriptions is written for 1 pill daily.  Pt is frustrated and scared that she's taken too much Folic Acid.  Informed pt that it's "OK" she didn't cause any harm to herself.  Informed pt that this nurse had already spoke with pt's pharmacist prior to speaking with pt regarding the Folic Acid prescription.  Pt is aware that the pt's insurance will not approve for a refill at this time d/t it's too early to refill.  Informed pt that the Folic Acid can be purchased over the counter (800 mcg) which is the same information the pt's Pharmacist told the pt earlier today via telephone.  Pt verbalized understanding.  Pt then inquired about doing a colonoscopy vs Cologuard.  Pt stated her PCP recommended the screening but pt wanted to know Dr. Latanya Maudlin opinion.  Informed pt that this nurse will ask Dr. Mosetta Putt.  Pt verbalized understanding and had no further questions or concerns.

## 2023-07-15 NOTE — Telephone Encounter (Signed)
I need the oncologist opinon- I cannot comment on the safety of the test with her current blood issue   (I don't know)  Thanks

## 2023-07-29 LAB — HM MAMMOGRAPHY

## 2023-07-29 LAB — HM DEXA SCAN

## 2023-07-31 ENCOUNTER — Encounter: Payer: Self-pay | Admitting: Family Medicine

## 2023-08-03 ENCOUNTER — Encounter: Payer: Self-pay | Admitting: *Deleted

## 2023-08-09 ENCOUNTER — Other Ambulatory Visit: Payer: Self-pay | Admitting: Hematology

## 2023-08-22 NOTE — Progress Notes (Unsigned)
Patient Care Team: Tower, Audrie Gallus, MD as PCP - General Lad, Julien Girt, MD as Referring Physician (Ophthalmology) Galen Manila, MD as Referring Physician (Ophthalmology) Breck Coons, DC as Consulting Physician (Chiropractic Medicine) Pershing Proud, RN as Oncology Nurse Navigator Donnelly Angelica, RN as Oncology Nurse Navigator Griselda Miner, MD as Consulting Physician (General Surgery) Malachy Mood, MD as Consulting Physician (Hematology) Dorothy Puffer, MD as Consulting Physician (Radiation Oncology) Pollyann Samples, NP as Nurse Practitioner (Nurse Practitioner) Alcus Dad., MD as Consulting Physician (Hematology and Oncology)   CHIEF COMPLAINT: Follow up large granular lymphocytic leukemia, and h/o breast cancer   Oncology History Overview Note  Cancer Staging Malignant neoplasm of upper-outer quadrant of left breast in female, estrogen receptor positive (HCC) Staging form: Breast, AJCC 8th Edition - Clinical stage from 08/29/2020: Stage IA (cT1c, cN0, cM0, G1, ER+, PR+, HER2-) - Unsigned - Pathologic stage from 10/31/2020: Stage IA (pT2, pN0(sn), cM0, G1, ER+, PR+, HER2-, Oncotype DX score: 12) - Unsigned    Malignant neoplasm of upper-outer quadrant of left breast in female, estrogen receptor positive (HCC)  08/02/2020 Mammogram   IMPRESSION The 1cm (0.9x1.2x0.9cm on US)asymmetry in the left breast at 1:00 position, 4 cmfn is indeterminate   08/22/2020 Initial Biopsy   Diagnosis 08/22/20 Breast, left, needle core biopsy, 1 o'clock, 4cmfn -INVASIVE MAMMARY CARCINOMA -SEE COMMENT       -Grade 1 or 3  E-cadherin is NEGATIVE supporting lobular origin   08/22/2020 Receptors her2   ER - 95%  PR - 25%  Ki67 - 10% HER2 Negative on [facility]   08/24/2020 Initial Diagnosis   Malignant neoplasm of upper-outer quadrant of left breast in female, estrogen receptor positive (HCC)   08/29/2020 Breast MRI   IMPRESSION: 1. Enhancing mass in the upper slightly outer  left breast measuring approximately 2.4 x 1.6 x 1.8 cm, consistent with the patient's known malignancy. 2. No MRI evidence of malignancy in the right breast. 3. No suspicious lymphadenopathy.   10/05/2020 Surgery   LEFT BREAST LUMPECTOMY WITH RADIOACTIVE SEED AND SENTINEL LYMPH NODE BIOPSY by Dr Carolynne Edouard    10/05/2020 Pathology Results   FINAL MICROSCOPIC DIAGNOSIS:   A. LYMPH NODE, LEFT AXILLARY #1, SENTINEL, EXCISION:  -  No carcinoma identified in one lymph node (0/1)  -  See comment   B. LYMPH NODE, LEFT AXILLARY, SENTINEL, EXCISION:  -  No carcinoma identified in one lymph node (0/1)  -  See comment   C. LYMPH NODE, LEFT AXILLARY #2, SENTINEL, EXCISION:  -  No carcinoma identified in one lymph node (0/1)  -  See comment   D. BREAST, LEFT, LUMPECTOMY:  -  Invasive lobular carcinoma, Nottingham grade 1 of 3, 2.3 cm  -  Margins uninvolved by carcinoma (<0.1 cm; superior margin)  -  Previous biopsy site changes present  -  See oncology table and comment below    10/05/2020 Oncotype testing   Oncoytpe  Recurrence Score 12 with distant recurrence risk at 9 years with AI or Tamoxifen at 3%  There is less than 1% benefit of chemotherapy.    11/19/2020 - 12/14/2020 Radiation Therapy   Adjuvant Radiation with Dr Mitzi Hansen    12/2020 -  Anti-estrogen oral therapy   Tamoxifen 20mg  daily starting 12/2020    03/13/2021 Survivorship   SCP delivered by [name], NP      CURRENT THERAPY:  LGLL: MTX with full dose 17.5mg  (10mg /m2) weekly.  One 15 mg  tablet , one  2.5 mg tablet  and take together once a week.  Breast cancer: Tamoxifen 12/2020, held 08/2022 - 02/2023 and restarted   INTERVAL HISTORY Jennifer Sellers returns for follow up as scheduled. Last seen by me 05/26/23.   ROS   Past Medical History:  Diagnosis Date   Arthritis    rheumatoid arthritis   Hypothyroidism    Leukopenia    Thyroid disease    TIA (transient ischemic attack) 2011     Past Surgical History:  Procedure  Laterality Date   ABDOMINAL HYSTERECTOMY     BREAST LUMPECTOMY WITH RADIOACTIVE SEED AND SENTINEL LYMPH NODE BIOPSY Left 10/05/2020   Procedure: LEFT BREAST LUMPECTOMY WITH RADIOACTIVE SEED AND SENTINEL LYMPH NODE BIOPSY;  Surgeon: Griselda Miner, MD;  Location: [facility];  Service: General;  Laterality: Left;  GENERAL AND PECTORAL BLOCK   CARPAL TUNNEL RELEASE     COLONOSCOPY     COLONOSCOPY WITH PROPOFOL N/A 06/30/2018   Procedure: COLONOSCOPY WITH PROPOFOL;  Surgeon: Scot Jun, MD;  Location: [facility];  Service: Endoscopy;  Laterality: N/A;   colpo-ureteroscopy     lens surgery       Outpatient Encounter Medications as of 08/24/2023  Medication Sig   folic acid (FOLVITE) 1 MG tablet TAKE 1 TABLET BY MOUTH EVERY DAY   levothyroxine (SYNTHROID) 75 MCG tablet Take 1 tablet (75 mcg total) by mouth daily before breakfast.   methotrexate (RHEUMATREX) 2.5 MG tablet TAKE 1 TAB EVERY WEEK WITH 15MG    methotrexate (TREXALL) 15 MG tablet Take 1 tablet (15 mg total) by mouth once a week. Take 1 pill (15 mg) by mouth with the 2.5 mg tab (of Methotrexate) for a total dose of 17.5mg , once a week on Sunday.  Caution:Chemotherapy. Protect from light.   Multiple Vitamins-Minerals (PRESERVISION AREDS 2 PO) Take 2 capsules by mouth daily.    NON FORMULARY 2 drops daily. sustane   Prenatal Multivit w/ Iron-FA (NEONATAL FE PO) Take by mouth.   tamoxifen (NOLVADEX) 20 MG tablet Take 1 tablet (20 mg total) by mouth daily.   triamcinolone cream (KENALOG) 0.5 % Apply 1 Application topically 2 (two) times daily.   No facility-administered encounter medications on file as of 08/24/2023.     There were no vitals filed for this visit. There is no height or weight on file to calculate BMI.   PHYSICAL EXAM GENERAL:alert, no distress and comfortable SKIN: no rash  EYES: sclera clear NECK: without mass LYMPH:  no palpable cervical or supraclavicular lymphadenopathy  LUNGS: clear with  normal breathing effort HEART: regular rate & rhythm, no lower extremity edema ABDOMEN: abdomen soft, non-tender and normal bowel sounds NEURO: alert & oriented x 3 with fluent speech, no focal motor/sensory deficits Breast exam:  PAC without erythema    CBC    Component Value Date/Time   WBC 1.7 Repeated and verified X2. (LL) 06/22/2023 0831   RBC 3.28 (L) 06/22/2023 0831   HGB 11.1 (L) 06/22/2023 0831   HGB 11.6 (L) 05/26/2023 0850   HGB 12.3 06/19/2014 1019   HCT 32.3 (L) 06/22/2023 0831   HCT 37.2 08/18/2022 0932   HCT 35.5 06/19/2014 1019   PLT 126.0 (L) 06/22/2023 0831   PLT 119 (L) 05/26/2023 0850   PLT 124 (L) 06/19/2014 1019   MCV 98.5 06/22/2023 0831   MCV 92.2 06/19/2014 1019   MCH 34.1 (H) 05/26/2023 0850   MCHC 34.4 06/22/2023 0831   RDW 14.3 06/22/2023 0831   RDW 13.2  06/19/2014 1019   LYMPHSABS 0.9 06/22/2023 0831   LYMPHSABS 0.7 (L) 06/19/2014 1019   MONOABS 0.1 06/22/2023 0831   MONOABS 0.2 06/19/2014 1019   EOSABS 0.0 06/22/2023 0831   EOSABS 0.0 06/19/2014 1019   EOSABS 0.1 11/14/2008 1447   BASOSABS 0.0 06/22/2023 0831   BASOSABS 0.0 06/19/2014 1019     CMP     Component Value Date/Time   NA 142 06/22/2023 0831   K 4.3 06/22/2023 0831   CL 108 06/22/2023 0831   CO2 27 06/22/2023 0831   GLUCOSE 89 06/22/2023 0831   BUN 14 06/22/2023 0831   CREATININE 1.04 06/22/2023 0831   CREATININE 1.05 (H) 05/26/2023 0850   CALCIUM 8.3 (L) 06/22/2023 0831   PROT 5.9 (L) 06/22/2023 0831   ALBUMIN 3.8 06/22/2023 0831   AST 15 06/22/2023 0831   AST 20 05/26/2023 0850   ALT 7 06/22/2023 0831   ALT 8 05/26/2023 0850   ALKPHOS 54 06/22/2023 0831   BILITOT 1.3 (H) 06/22/2023 0831   BILITOT 1.3 (H) 05/26/2023 0850   GFRNONAA 54 (L) 05/26/2023 0850   GFRAA 129 05/01/2008 0949     ASSESSMENT & PLAN:Jennifer Sellers is a 77 y.o. 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  -bone marrow biopsy on 09/25/22 showed: Hypercellular bone marrow with an atypical T-cell lymphocytosis (30% to 60%; aspirate versus flow cytometry) consisting predominantly of CD8 positive T cells with dim CD5. Morphologically large granular lymphocytes are not detected; however, the phenotype and clinical history of rheumatoid arthritis suggest possible large granular lymphocytosis.  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  -further molecular testing showed T-cell gamma gene rearrangement positive, and LGL flow cytometry showed increased T-cell large granular lymphocyte (28.6% total cells), supporting the diagnosis of LGL -she started MTX at low dose 5mg  wekely on 12/14/2022, seen by LGL leukemia expert Dr. Irving Copas at [facility] and her diagnose was confirmed; - Dr. Irving Copas also recommend MTX with full dose 17.5mg  (10mg /m2) weekly.  -Ms. Kalish appears stable. Tolerating MTX, we reviewed her dose. Mild pancytopenia is stable. Continue same dose -F/up in 3 months    Malignant neoplasm of upper-outer quadrant of left breast in female, estrogen receptor positive (HCC) -diagnosed in 08/2020. 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 Oncotype RS 12, low risk. S/p adjuvant radiation. -she began Tamoxifen 12/2020, tolerating well overall. Held since 08/18/22 for work up of leukopenia. Restarted 02/2023  - Tolerating tamoxifen.  Exam is benign on 7/9.  Overall no clinical concern for recurrence -Mammogram 07/2023 was benign, DEXA showed osteopenia and high frax score   Dry cough, rhinorrhea -Onset 12/2022, we have been monitoring -CXR 7/9 was negative        PLAN:  No  orders of the defined types were placed in this encounter.     All questions were answered. The patient knows to call the clinic with any problems, questions or concerns. No barriers to learning were detected. I spent *** counseling the patient face to face. The total time spent in the appointment was *** and more than 50% was  on counseling, review of test results, and coordination of care.   [name], NP-C @DATE @

## 2023-08-24 ENCOUNTER — Inpatient Hospital Stay (HOSPITAL_BASED_OUTPATIENT_CLINIC_OR_DEPARTMENT_OTHER): Payer: Medicare Other | Admitting: Nurse Practitioner

## 2023-08-24 ENCOUNTER — Encounter: Payer: Self-pay | Admitting: Nurse Practitioner

## 2023-08-24 ENCOUNTER — Inpatient Hospital Stay: Payer: Medicare Other | Attending: Hematology

## 2023-08-24 VITALS — BP 117/81 | HR 68 | Temp 98.3°F | Resp 16 | Ht 66.0 in | Wt 129.1 lb

## 2023-08-24 DIAGNOSIS — C91Z0 Other lymphoid leukemia not having achieved remission: Secondary | ICD-10-CM | POA: Insufficient documentation

## 2023-08-24 DIAGNOSIS — Z90710 Acquired absence of both cervix and uterus: Secondary | ICD-10-CM | POA: Diagnosis not present

## 2023-08-24 DIAGNOSIS — R059 Cough, unspecified: Secondary | ICD-10-CM | POA: Diagnosis not present

## 2023-08-24 DIAGNOSIS — C50412 Malignant neoplasm of upper-outer quadrant of left female breast: Secondary | ICD-10-CM | POA: Diagnosis not present

## 2023-08-24 DIAGNOSIS — D649 Anemia, unspecified: Secondary | ICD-10-CM

## 2023-08-24 DIAGNOSIS — Z170 Estrogen receptor positive status [ER+]: Secondary | ICD-10-CM | POA: Diagnosis not present

## 2023-08-24 DIAGNOSIS — M8580 Other specified disorders of bone density and structure, unspecified site: Secondary | ICD-10-CM | POA: Diagnosis not present

## 2023-08-24 DIAGNOSIS — J3489 Other specified disorders of nose and nasal sinuses: Secondary | ICD-10-CM | POA: Insufficient documentation

## 2023-08-24 DIAGNOSIS — Z79810 Long term (current) use of selective estrogen receptor modulators (SERMs): Secondary | ICD-10-CM | POA: Diagnosis not present

## 2023-08-24 LAB — CMP (CANCER CENTER ONLY)
ALT: 8 U/L (ref 0–44)
AST: 17 U/L (ref 15–41)
Albumin: 3.7 g/dL (ref 3.5–5.0)
Alkaline Phosphatase: 54 U/L (ref 38–126)
Anion gap: 3 — ABNORMAL LOW (ref 5–15)
BUN: 16 mg/dL (ref 8–23)
CO2: 29 mmol/L (ref 22–32)
Calcium: 8.5 mg/dL — ABNORMAL LOW (ref 8.9–10.3)
Chloride: 110 mmol/L (ref 98–111)
Creatinine: 1.16 mg/dL — ABNORMAL HIGH (ref 0.44–1.00)
GFR, Estimated: 48 mL/min — ABNORMAL LOW (ref >60)
Glucose, Bld: 93 mg/dL (ref 70–99)
Potassium: 4.6 mmol/L (ref 3.5–5.1)
Sodium: 142 mmol/L (ref 135–145)
Total Bilirubin: 1.2 mg/dL (ref 0.3–1.2)
Total Protein: 5.9 g/dL — ABNORMAL LOW (ref 6.5–8.1)

## 2023-08-24 LAB — CBC WITH DIFFERENTIAL (CANCER CENTER ONLY)
Abs Immature Granulocytes: 0.01 10*3/uL (ref 0.00–0.07)
Basophils Absolute: 0 10*3/uL (ref 0.0–0.1)
Basophils Relative: 1 %
Eosinophils Absolute: 0 10*3/uL (ref 0.0–0.5)
Eosinophils Relative: 2 %
HCT: 34.2 % — ABNORMAL LOW (ref 36.0–46.0)
Hemoglobin: 11.4 g/dL — ABNORMAL LOW (ref 12.0–15.0)
Immature Granulocytes: 0 %
Lymphocytes Relative: 37 %
Lymphs Abs: 0.9 10*3/uL (ref 0.7–4.0)
MCH: 34 pg (ref 26.0–34.0)
MCHC: 33.3 g/dL (ref 30.0–36.0)
MCV: 102.1 fL — ABNORMAL HIGH (ref 80.0–100.0)
Monocytes Absolute: 0.1 10*3/uL (ref 0.1–1.0)
Monocytes Relative: 6 %
Neutro Abs: 1.2 10*3/uL — ABNORMAL LOW (ref 1.7–7.7)
Neutrophils Relative %: 54 %
Platelet Count: 119 10*3/uL — ABNORMAL LOW (ref 150–400)
RBC: 3.35 MIL/uL — ABNORMAL LOW (ref 3.87–5.11)
RDW: 13.9 % (ref 11.5–15.5)
WBC Count: 2.3 10*3/uL — ABNORMAL LOW (ref 4.0–10.5)
nRBC: 0 % (ref 0.0–0.2)

## 2023-08-24 LAB — FERRITIN: Ferritin: 58 ng/mL (ref 11–307)

## 2023-08-25 ENCOUNTER — Ambulatory Visit: Payer: Medicare Other | Admitting: Nurse Practitioner

## 2023-08-25 ENCOUNTER — Other Ambulatory Visit: Payer: Medicare Other

## 2023-08-25 ENCOUNTER — Telehealth: Payer: Self-pay | Admitting: *Deleted

## 2023-08-25 NOTE — Telephone Encounter (Signed)
This referral was place in our queue again.  Per Telehone Encounter with me on 07/14/23   Spoken to patient and notified her that screening for colonoscopy stop at the age of 60.    Inform patient that she would need an office visit with a provider due to age and health before scheduling a colonoscopy. It is consider high risk.  Please call patient if she still would like to get this done. She will be a new patient because she was never seen at our office.

## 2023-09-01 ENCOUNTER — Other Ambulatory Visit: Payer: Self-pay | Admitting: Hematology

## 2023-09-06 ENCOUNTER — Other Ambulatory Visit: Payer: Self-pay | Admitting: Family Medicine

## 2023-10-11 ENCOUNTER — Other Ambulatory Visit: Payer: Self-pay | Admitting: Hematology

## 2023-10-13 ENCOUNTER — Other Ambulatory Visit: Payer: Self-pay

## 2023-10-23 ENCOUNTER — Other Ambulatory Visit: Payer: Self-pay | Admitting: Hematology

## 2023-10-23 DIAGNOSIS — Z170 Estrogen receptor positive status [ER+]: Secondary | ICD-10-CM

## 2023-11-23 ENCOUNTER — Other Ambulatory Visit: Payer: Medicare Other

## 2023-11-23 ENCOUNTER — Inpatient Hospital Stay: Payer: Medicare Other

## 2023-11-23 ENCOUNTER — Ambulatory Visit: Payer: Medicare Other | Admitting: Hematology

## 2023-11-23 ENCOUNTER — Inpatient Hospital Stay: Payer: Medicare Other | Admitting: Hematology

## 2023-11-25 ENCOUNTER — Ambulatory Visit: Payer: Medicare Other | Admitting: Hematology

## 2023-11-27 ENCOUNTER — Inpatient Hospital Stay (HOSPITAL_BASED_OUTPATIENT_CLINIC_OR_DEPARTMENT_OTHER): Payer: Medicare Other | Admitting: Hematology

## 2023-11-27 ENCOUNTER — Encounter: Payer: Self-pay | Admitting: Hematology

## 2023-11-27 ENCOUNTER — Inpatient Hospital Stay: Payer: Medicare Other | Attending: Hematology

## 2023-11-27 VITALS — BP 126/75 | HR 65 | Temp 97.3°F | Resp 15 | Wt 136.2 lb

## 2023-11-27 DIAGNOSIS — C50412 Malignant neoplasm of upper-outer quadrant of left female breast: Secondary | ICD-10-CM

## 2023-11-27 DIAGNOSIS — C91Z0 Other lymphoid leukemia not having achieved remission: Secondary | ICD-10-CM

## 2023-11-27 DIAGNOSIS — Z853 Personal history of malignant neoplasm of breast: Secondary | ICD-10-CM | POA: Insufficient documentation

## 2023-11-27 DIAGNOSIS — Z90710 Acquired absence of both cervix and uterus: Secondary | ICD-10-CM | POA: Diagnosis not present

## 2023-11-27 DIAGNOSIS — D649 Anemia, unspecified: Secondary | ICD-10-CM

## 2023-11-27 LAB — CMP (CANCER CENTER ONLY)
ALT: 9 U/L (ref 0–44)
AST: 19 U/L (ref 15–41)
Albumin: 4 g/dL (ref 3.5–5.0)
Alkaline Phosphatase: 57 U/L (ref 38–126)
Anion gap: 4 — ABNORMAL LOW (ref 5–15)
BUN: 14 mg/dL (ref 8–23)
CO2: 30 mmol/L (ref 22–32)
Calcium: 8.8 mg/dL — ABNORMAL LOW (ref 8.9–10.3)
Chloride: 108 mmol/L (ref 98–111)
Creatinine: 1.1 mg/dL — ABNORMAL HIGH (ref 0.44–1.00)
GFR, Estimated: 51 mL/min — ABNORMAL LOW (ref >60)
Glucose, Bld: 93 mg/dL (ref 70–99)
Potassium: 3.9 mmol/L (ref 3.5–5.1)
Sodium: 142 mmol/L (ref 135–145)
Total Bilirubin: 1.5 mg/dL — ABNORMAL HIGH (ref 0.0–1.2)
Total Protein: 6.6 g/dL (ref 6.5–8.1)

## 2023-11-27 LAB — CBC WITH DIFFERENTIAL (CANCER CENTER ONLY)
Abs Immature Granulocytes: 0.01 10*3/uL (ref 0.00–0.07)
Basophils Absolute: 0 10*3/uL (ref 0.0–0.1)
Basophils Relative: 2 %
Eosinophils Absolute: 0 10*3/uL (ref 0.0–0.5)
Eosinophils Relative: 1 %
HCT: 35.9 % — ABNORMAL LOW (ref 36.0–46.0)
Hemoglobin: 12.1 g/dL (ref 12.0–15.0)
Immature Granulocytes: 1 %
Lymphocytes Relative: 32 %
Lymphs Abs: 0.6 10*3/uL — ABNORMAL LOW (ref 0.7–4.0)
MCH: 33.2 pg (ref 26.0–34.0)
MCHC: 33.7 g/dL (ref 30.0–36.0)
MCV: 98.6 fL (ref 80.0–100.0)
Monocytes Absolute: 0.1 10*3/uL (ref 0.1–1.0)
Monocytes Relative: 5 %
Neutro Abs: 1.1 10*3/uL — ABNORMAL LOW (ref 1.7–7.7)
Neutrophils Relative %: 59 %
Platelet Count: 142 10*3/uL — ABNORMAL LOW (ref 150–400)
RBC: 3.64 MIL/uL — ABNORMAL LOW (ref 3.87–5.11)
RDW: 13.5 % (ref 11.5–15.5)
WBC Count: 1.9 10*3/uL — ABNORMAL LOW (ref 4.0–10.5)
nRBC: 0 % (ref 0.0–0.2)

## 2023-11-27 LAB — FERRITIN: Ferritin: 52 ng/mL (ref 11–307)

## 2023-11-27 NOTE — Assessment & Plan Note (Signed)
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  -bone marrow biopsy on 09/25/22 showed: Hypercellular bone marrow with an atypical T-cell lymphocytosis (30% to 60%; aspirate versus flow cytometry) consisting predominantly of CD8 positive T cells with dim CD5. Morphologically large granular lymphocytes are not detected; however, the phenotype and clinical history of rheumatoid arthritis suggest possible large granular lymphocytosis.  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  -further molecular testing showed T-cell garma gene rearrangement positive, and LGL flow cytometry showed increased T-cell large granular lymphocyte (28.6% total cells), supporting the diagnosis of LGL -she started MTX at low dose 5mg  wekely on 12/14/2022, she is tolerating it well.  Plan to increase dose to 50 mg weekly from next dose -I have referred her to LGL leukemia expert Dr. Wallis Mart at [facility] and she was seen on 02/10/23. Her diagnose was confirmed and Dr. Wallis Mart also recommend MTX with full dose 17.5mg  (10mg /m2) weekly.

## 2023-11-27 NOTE — Progress Notes (Signed)
 [facility]   Telephone:(336) [phone] [phone]   Clinic Follow up Note   Patient Care Team: Tower, Laine LABOR, MD as PCP - General Lad, Donivan Barry, MD as Referring Physician (Ophthalmology) [name], MD as Referring Physician (Ophthalmology) [name], DC as Consulting Physician (Chiropractic Medicine) [name], RN as Oncology Nurse Navigator [name], RN as Oncology Nurse Navigator [name], MD as Consulting Physician (General Surgery) [name], MD as Consulting Physician (Hematology) [name], MD as Consulting Physician (Radiation Oncology) Burton, [name], NP as Nurse Practitioner (Nurse Practitioner) [name]., MD as Consulting Physician (Hematology and Oncology)  Date of Service:  11/27/2023  CHIEF COMPLAINT: f/u of LGL  CURRENT THERAPY:  Oral methotrexate  17.5 mg weekly  Oncology History   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  -bone marrow biopsy on 09/25/22 showed: Hypercellular bone marrow with an atypical T-cell lymphocytosis (30% to 60%; aspirate versus flow cytometry) consisting predominantly of CD8 positive T cells with dim CD5. Morphologically large granular lymphocytes are not detected; however, the phenotype and clinical history of rheumatoid arthritis suggest possible large granular lymphocytosis.  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  -further molecular testing showed T-cell garma gene rearrangement positive, and LGL flow cytometry showed increased T-cell large granular lymphocyte (28.6% total cells), supporting the diagnosis of LGL -she started MTX at low dose 5mg   wekely on 12/14/2022, she is tolerating it well.  Plan to increase dose to 50 mg weekly from next dose -I have referred her to LGL leukemia expert Dr. Lamona at [facility] and she was seen on 02/10/23. Her diagnose was confirmed and Dr. Lamona also recommend MTX with full dose 17.5mg  (10mg /m2) weekly.      Assessment and Plan    Large Granular Lymphocytic Leukemia (LGL) Currently on methotrexate  17.5 mg weekly, well-tolerated without gastrointestinal side effects. Blood counts have improved: hemoglobin 12.1 (previously 11-11.8), neutrophil count 1.1 (previously <1.0), platelet count 142. Pending kidney and liver function tests were previously normal. Delahunty-term methotrexate  treatment planned. Discussed infection risk due to neutropenia and need for antibiotics if infection occurs. Prefers to avoid flu shot, will wear a mask and avoid sick individuals. - Continue methotrexate  17.5 mg weekly - Monitor for infection signs (fever, chills) and contact healthcare provider if symptoms occur - Avoid flu shot; wear a mask and avoid sick individuals - Review blood count results - Schedule next appointment in 3 months  General Health Maintenance Declined flu shot. Advised to wear a mask and avoid contact with sick individuals to prevent infections. - Wear a mask and avoid contact with sick individuals  Plan -Lab reviewed, overall stable -Continue oral methotrexate  17.5 mg weekly - Schedule next appointment in 3 months - Contact healthcare provider if signs of infection occur.         SUMMARY OF ONCOLOGIC HISTORY: Oncology History Overview Note  Cancer Staging Malignant neoplasm of upper-outer quadrant of left breast in female, estrogen receptor positive (HCC) Staging form: Breast, AJCC 8th Edition - Clinical stage from 08/29/2020: Stage IA (cT1c, cN0, cM0, G1, ER+, PR+, HER2-) - Unsigned - Pathologic stage from 10/31/2020: Stage IA (pT2, pN0(sn), cM0, G1, ER+, PR+, HER2-,  Oncotype DX score: 12) -  Unsigned    Malignant neoplasm of upper-outer quadrant of left breast in female, estrogen receptor positive (HCC)  08/02/2020 Mammogram   IMPRESSION The 1cm (0.9x1.2x0.9cm on US )asymmetry in the left breast at 1:00 position, 4 cmfn is indeterminate   08/22/2020 Initial Biopsy   Diagnosis 08/22/20 Breast, left, needle core biopsy, 1 o'clock, 4cmfn -INVASIVE MAMMARY CARCINOMA -SEE COMMENT       -Grade 1 or 3  E-cadherin is NEGATIVE supporting lobular origin   08/22/2020 Receptors her2   ER - 95%  PR - 25%  Ki67 - 10% HER2 Negative on FISH   08/24/2020 Initial Diagnosis   Malignant neoplasm of upper-outer quadrant of left breast in female, estrogen receptor positive (HCC)   08/29/2020 Breast MRI   IMPRESSION: 1. Enhancing mass in the upper slightly outer left breast measuring approximately 2.4 x 1.6 x 1.8 cm, consistent with the patient's known malignancy. 2. No MRI evidence of malignancy in the right breast. 3. No suspicious lymphadenopathy.   10/05/2020 Surgery   LEFT BREAST LUMPECTOMY WITH RADIOACTIVE SEED AND SENTINEL LYMPH NODE BIOPSY by Dr Curvin    10/05/2020 Pathology Results   FINAL MICROSCOPIC DIAGNOSIS:   A. LYMPH NODE, LEFT AXILLARY #1, SENTINEL, EXCISION:  -  No carcinoma identified in one lymph node (0/1)  -  See comment   B. LYMPH NODE, LEFT AXILLARY, SENTINEL, EXCISION:  -  No carcinoma identified in one lymph node (0/1)  -  See comment   C. LYMPH NODE, LEFT AXILLARY #2, SENTINEL, EXCISION:  -  No carcinoma identified in one lymph node (0/1)  -  See comment   D. BREAST, LEFT, LUMPECTOMY:  -  Invasive lobular carcinoma, Nottingham grade 1 of 3, 2.3 cm  -  Margins uninvolved by carcinoma (<0.1 cm; superior margin)  -  Previous biopsy site changes present  -  See oncology table and comment below    10/05/2020 Oncotype testing   Oncoytpe  Recurrence Score 12 with distant recurrence risk at 9 years with AI or Tamoxifen  at 3%  There is less than 1% benefit  of chemotherapy.    11/19/2020 - 12/14/2020 Radiation Therapy   Adjuvant Radiation with Dr Dewey    12/2020 -  Anti-estrogen oral therapy   Tamoxifen  20mg  daily starting 12/2020    03/13/2021 Survivorship   SCP delivered by Lacie Burton, NP      Discussed the use of AI scribe software for clinical note transcription with the patient, who gave verbal consent to proceed.  History of Present Illness   The patient, a 78 year old individual with a known diagnosis of large granular lymphocytic leukemia (LGLL), is currently on methotrexate  therapy. The patient reports no adverse effects from the medication, denying any nausea, diarrhea, or stomach discomfort. She reports a good appetite and has gained weight over the recent holiday period. The patient's blood counts have shown improvement since the last visit, with an increase in hemoglobin and neutrophil count. The patient's platelet count is also reported to be satisfactory. The patient's overall condition seems to have improved, as noted by a family member or caregiver present during the consultation.         All other systems were reviewed with the patient and are negative.  MEDICAL HISTORY:  Past Medical History:  Diagnosis Date   Arthritis    rheumatoid arthritis   Hypothyroidism    Leukopenia    Thyroid  disease    TIA (transient ischemic attack) 2011  SURGICAL HISTORY: Past Surgical History:  Procedure Laterality Date   ABDOMINAL HYSTERECTOMY     BREAST LUMPECTOMY WITH RADIOACTIVE SEED AND SENTINEL LYMPH NODE BIOPSY Left 10/05/2020   Procedure: LEFT BREAST LUMPECTOMY WITH RADIOACTIVE SEED AND SENTINEL LYMPH NODE BIOPSY;  Surgeon: [name], MD;  Location: [facility];  Service: General;  Laterality: Left;  GENERAL AND PECTORAL BLOCK   CARPAL TUNNEL RELEASE     COLONOSCOPY     COLONOSCOPY WITH PROPOFOL  N/A 06/30/2018   Procedure: COLONOSCOPY WITH PROPOFOL ;  Surgeon: Viktoria Lamar DASEN, MD;  Location: [facility]  ENDOSCOPY;  Service: Endoscopy;  Laterality: N/A;   colpo-ureteroscopy     lens surgery      I have reviewed the social history and family history with the patient and they are unchanged from previous note.  ALLERGIES:  is allergic to misc. sulfonamide containing compounds, cipro [ciprofloxacin hcl], cortisporin [bacitra-neomycin -polymyxin-hc], prevnar [pneumococcal 13-val conj vacc], sulfa antibiotics, and sulfonamide derivatives.  MEDICATIONS:  Current Outpatient Medications  Medication Sig Dispense Refill   folic acid  (FOLVITE ) 1 MG tablet TAKE 1 TABLET BY MOUTH EVERY DAY (Patient taking differently: Take 2 mg by mouth daily.) 90 tablet 1   levothyroxine  (SYNTHROID ) 75 MCG tablet TAKE 1 TABLET BY MOUTH DAILY BEFORE BREAKFAST. 90 tablet 2   methotrexate  (RHEUMATREX) 2.5 MG tablet TAKE 1 TAB EVERY WEEK WITH 15MG  12 tablet 1   Multiple Vitamins-Minerals (PRESERVISION AREDS 2 PO) Take 2 capsules by mouth daily.      NON FORMULARY 2 drops daily. sustane     Prenatal Multivit w/ Iron-FA (NEONATAL FE PO) Take by mouth.     tamoxifen  (NOLVADEX ) 20 MG tablet TAKE 1 TABLET BY MOUTH EVERY DAY 90 tablet 1   TREXALL  15 MG tablet TAKE 1 PILL (15 MG) WITH 2.5 MG TAB ( METHOTREXATE ) FOR TOTAL DOSE OF 17.5MG , ONCE A WEEK ON SUNDAY 4 tablet 3   triamcinolone  cream (KENALOG ) 0.5 % Apply 1 Application topically 2 (two) times daily. (Patient not taking: Reported on 08/24/2023) 15 g 0   No current facility-administered medications for this visit.    PHYSICAL EXAMINATION: ECOG PERFORMANCE STATUS: 1 - Symptomatic but completely ambulatory  Vitals:   11/27/23 0915  BP: 126/75  Pulse: 65  Resp: 15  Temp: (!) 97.3 F (36.3 C)  SpO2: 100%   Wt Readings from Last 3 Encounters:  11/27/23 136 lb 3.2 oz (61.8 kg)  08/24/23 129 lb 1.6 oz (58.6 kg)  06/24/23 129 lb (58.5 kg)     GENERAL:alert, no distress and comfortable SKIN: skin color, texture, turgor are normal, no rashes or significant  lesions EYES: normal, Conjunctiva are pink and non-injected, sclera clear NECK: supple, thyroid  normal size, non-tender, without nodularity LYMPH:  no palpable lymphadenopathy in the cervical, axillary  LUNGS: clear to auscultation and percussion with normal breathing effort HEART: regular rate & rhythm and no murmurs and no lower extremity edema ABDOMEN:abdomen soft, non-tender and normal bowel sounds Musculoskeletal:no cyanosis of digits and no clubbing  NEURO: alert & oriented x 3 with fluent speech, no focal motor/sensory deficits    LABORATORY DATA:  I have reviewed the data as listed    Latest Ref Rng & Units 11/27/2023    8:56 AM 08/24/2023    8:51 AM 06/22/2023    8:31 AM  CBC  WBC 4.0 - 10.5 K/uL 1.9  2.3  1.7 Repeated and verified X2.   Hemoglobin 12.0 - 15.0 g/dL 87.8  88.5  88.8   Hematocrit  36.0 - 46.0 % 35.9  34.2  32.3   Platelets 150 - 400 K/uL 142  119  126.0         Latest Ref Rng & Units 11/27/2023    8:56 AM 08/24/2023    8:51 AM 06/22/2023    8:31 AM  CMP  Glucose 70 - 99 mg/dL 93  93  89   BUN 8 - 23 mg/dL 14  16  14    Creatinine 0.44 - 1.00 mg/dL 8.89  8.83  8.95   Sodium 135 - 145 mmol/L 142  142  142   Potassium 3.5 - 5.1 mmol/L 3.9  4.6  4.3   Chloride 98 - 111 mmol/L 108  110  108   CO2 22 - 32 mmol/L 30  29  27    Calcium 8.9 - 10.3 mg/dL 8.8  8.5  8.3   Total Protein 6.5 - 8.1 g/dL 6.6  5.9  5.9   Total Bilirubin 0.0 - 1.2 mg/dL 1.5  1.2  1.3   Alkaline Phos 38 - 126 U/L 57  54  54   AST 15 - 41 U/L 19  17  15    ALT 0 - 44 U/L 9  8  7        RADIOGRAPHIC STUDIES: I have personally reviewed the radiological images as listed and agreed with the findings in the report. No results found.    No orders of the defined types were placed in this encounter.  All questions were answered. The patient knows to call the clinic with any problems, questions or concerns. No barriers to learning was detected. The total time spent in the appointment was 20  minutes.     Onita Mattock, MD 11/27/2023

## 2024-01-15 ENCOUNTER — Other Ambulatory Visit: Payer: Self-pay | Admitting: Hematology

## 2024-01-31 ENCOUNTER — Other Ambulatory Visit: Payer: Self-pay | Admitting: Hematology

## 2024-02-22 ENCOUNTER — Other Ambulatory Visit: Payer: Self-pay | Admitting: Hematology

## 2024-03-09 ENCOUNTER — Other Ambulatory Visit: Payer: Self-pay

## 2024-03-09 DIAGNOSIS — Z170 Estrogen receptor positive status [ER+]: Secondary | ICD-10-CM

## 2024-03-09 NOTE — Assessment & Plan Note (Addendum)
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  -bone marrow biopsy on 09/25/22 showed: Hypercellular bone marrow with an atypical T-cell lymphocytosis (30% to 60%; aspirate versus flow cytometry) consisting predominantly of CD8 positive T cells with dim CD5. Morphologically large granular lymphocytes are not detected; however, the phenotype and clinical history of rheumatoid arthritis suggest possible large granular lymphocytosis.  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  -further molecular testing showed T-cell garma gene rearrangement positive, and LGL flow cytometry showed increased T-cell large granular lymphocyte (28.6% total cells), supporting the diagnosis of LGL -she started MTX at low dose 5mg  wekely on 12/14/2022, she is tolerating it well.  Plan to increase dose to 50 mg weekly from next dose -She was referred to LGL leukemia expert Dr. Carie Charity at [facility] and she was seen on 02/10/23. Her diagnose was confirmed and Dr. Carie Charity also recommend MTX with full dose 17.5mg  (10mg /m2) weekly.  -patient is tolerating the methotrexate  well. She does have stable pancytopenia due to methotrexate .  -monitor  labs with folow up in 3 months.

## 2024-03-09 NOTE — Progress Notes (Signed)
 Patient Care Team: Tower, Manley Seeds, MD as PCP - General Lad, Alberta Almond, MD as Referring Physician (Ophthalmology) Clair Crews, MD as Referring Physician (Ophthalmology) Wendee Halon, DC as Consulting Physician (Chiropractic Medicine) Auther Bo, RN as Oncology Nurse Navigator Alane Hsu, RN as Oncology Nurse Navigator Caralyn Chandler, MD as Consulting Physician (General Surgery) Sonja [place], MD as Consulting Physician (Hematology) Johna Myers, MD as Consulting Physician (Radiation Oncology) Burton, [name], NP as Nurse Practitioner (Nurse Practitioner) Carie Charity Jodeane Mulligan., MD as Consulting Physician (Hematology and Oncology)  Clinic Day:  03/13/2024  Referring physician: Tower, Manley Seeds, MD  ASSESSMENT & PLAN:   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  -bone marrow biopsy on 09/25/22 showed: Hypercellular bone marrow with an atypical T-cell lymphocytosis (30% to 60%; aspirate versus flow cytometry) consisting predominantly of CD8 positive T cells with dim CD5. Morphologically large granular lymphocytes are not detected; however, the phenotype and clinical history of rheumatoid arthritis suggest possible large granular lymphocytosis.  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  -further molecular testing showed T-cell garma gene rearrangement positive, and LGL flow cytometry showed increased T-cell large granular lymphocyte (28.6% total cells), supporting the diagnosis of LGL -she started MTX at low dose 5mg  wekely on 12/14/2022, she is tolerating it well.  Plan to increase dose to 50 mg weekly from next dose -She was referred to LGL  leukemia expert Dr. Carie Charity at [facility] and she was seen on 02/10/23. Her diagnose was confirmed and Dr. Carie Charity also recommend MTX with full dose 17.5mg  (10mg /m2) weekly.  -patient is tolerating the methotrexate  well. She does have stable pancytopenia due to methotrexate .  -monitor  labs with folow up in 3 months.   Malignant neoplasm of upper-outer quadrant of left breast in female, estrogen receptor positive (HCC) -diagnosed in 08/2020. S/p left lumpectomy with SLNB by Dr Alethea Andes on 10/05/20, pathology showed 2.3 cm lobular carcinoma with clean margins, node negative. Oncotype RS 12, low risk. S/p adjuvant radiation. -she began Tamoxifen  12/2020, tolerating well overall. Held since 08/18/22 for work up of leukopenia -she restarted tamoxifen  in 11/2022 and continues to  tolerate this well.  -most  recent screening mammogram done 07/2023 with benign  results.    Plan: Reviewed labs  - mild and stable pancytopenia - WBC 2.4, ANC 1.3, Hgb 10.7, HCT 32.0, and platelet 128. Ferritin improved at 103.  -Continue strict infection precautions which include wearing a mask in public, good hand hygiene, and avoiding sick contacts.  Flu shot is recommended however, she declines. - Continue methotrexate  17.5 mg weekly - Monitor for infection signs (fever, chills) and contact healthcare provider if symptoms occur - Continue tamoxifen  20 mg daily.  Next screening mammogram due September 2025. -Labs with follow-up in 3 months.  Sooner if needed.  The patient understands the plans discussed today and is in agreement with them.  She knows to contact our office if she develops concerns prior to her next appointment.  I provided 25 minutes of face-to-face time during this encounter and > 50% was spent counseling as documented under my assessment and plan.    Sharyon Deis, NP  [facility]  [facility] - A DEPT OF [facility] [address] [place]  [address] Dept: [phone] Dept Fax: [phone]   No orders of the defined types were placed in this encounter.     CHIEF COMPLAINT:  CC: Large granular lymphocyte leukemia  Current Treatment: Oral methotrexate  17.5 mg weekly.  Tamoxifen  20 mg daily started glory 2022.  INTERVAL HISTORY:  Marvis is here today for repeat clinical assessment.  She last saw Dr. Maryalice Smaller on 11/27/2023.  Currently on methotrexate  at 17.5 mg and has been well tolerated without GI side effects.  She reports fatigue and increased forgetfulness.  She feels like this is new however, several visits with primary care and neurology have addressed concerns with short-term memory loss.  She does follow-up with neurology on a routine basis.  Neurology on a routine basis.  She continues tamoxifen  20 mg daily.  Tolerating this well also.  Denies joint pain, hot flashes, or reasonable night sweats.  No changes in the breast, no lumps, no masses, or lymph node swelling has been noted.  Mammogram done September 2024 with benign results. She also had bone density done September 2024 showing osteopenia. She denies chest pain, chest pressure, or shortness of breath. She denies headaches or visual disturbances. She denies abdominal pain, nausea, vomiting, or changes in bowel or bladder habits.   She denies fevers or chills. She denies pain. Her appetite is good. Her weight has been stable.  I have reviewed the past medical history, past surgical history, social history and family history with the patient and they are unchanged from previous note.  ALLERGIES:  is allergic to misc. sulfonamide containing compounds, cipro [ciprofloxacin hcl], cortisporin [bacitra-neomycin -polymyxin-hc], prevnar [pneumococcal 13-val conj vacc], sulfa antibiotics, and sulfonamide derivatives.  MEDICATIONS:  Current Outpatient Medications  Medication Sig Dispense Refill   folic acid  (FOLVITE ) 1 MG tablet TAKE 1 TABLET BY MOUTH EVERY DAY 90 tablet 1    levothyroxine  (SYNTHROID ) 75 MCG tablet TAKE 1 TABLET BY MOUTH DAILY BEFORE BREAKFAST. 90 tablet 2   methotrexate  (RHEUMATREX) 2.5 MG tablet TAKE 1 TAB EVERY WEEK WITH 15MG  12 tablet 1   Multiple Vitamins-Minerals (PRESERVISION AREDS 2 PO) Take 2 capsules by mouth daily.      NON FORMULARY 2 drops daily. sustane     Prenatal Multivit w/ Iron-FA (NEONATAL FE PO) Take by mouth.     tamoxifen  (NOLVADEX ) 20 MG tablet TAKE 1 TABLET BY MOUTH EVERY DAY 90 tablet 1   TREXALL  15 MG tablet TAKE 1 PILL (15 MG) WITH 2.5 MG TAB ( METHOTREXATE ) FOR TOTAL DOSE OF 17.5MG , ONCE A WEEK ON SUNDAY 4 tablet 3   triamcinolone  cream (KENALOG ) 0.5 % Apply 1 Application topically 2 (two) times daily. 15 g 0   No current facility-administered medications for this visit.    HISTORY OF PRESENT ILLNESS:   Oncology History Overview Note  Cancer Staging Malignant neoplasm of upper-outer quadrant of left breast in female, estrogen receptor positive (HCC) Staging form: Breast, AJCC 8th Edition - Clinical stage from 08/29/2020: Stage IA (cT1c, cN0, cM0, G1, ER+, PR+, HER2-) - Unsigned - Pathologic stage from 10/31/2020: Stage IA (pT2, pN0(sn), cM0, G1, ER+, PR+, HER2-, Oncotype DX score: 12) - Unsigned    Malignant neoplasm of upper-outer quadrant of left breast in female, estrogen receptor positive (HCC)  08/02/2020 Mammogram   IMPRESSION The 1cm (0.9x1.2x0.9cm on US )asymmetry in the left breast at 1:00 position, 4 cmfn is indeterminate   08/22/2020 Initial  Biopsy   Diagnosis 08/22/20 Breast, left, needle core biopsy, 1 o'clock, 4cmfn -INVASIVE MAMMARY CARCINOMA -SEE COMMENT       -Grade 1 or 3  E-cadherin is NEGATIVE supporting lobular origin   08/22/2020 Receptors her2   ER - 95%  PR - 25%  Ki67 - 10% HER2 Negative on [facility]   08/24/2020 Initial Diagnosis   Malignant neoplasm of upper-outer quadrant of left breast in female, estrogen receptor positive (HCC)   08/29/2020 Breast MRI   IMPRESSION: 1. Enhancing  mass in the upper slightly outer left breast measuring approximately 2.4 x 1.6 x 1.8 cm, consistent with the patient's known malignancy. 2. No MRI evidence of malignancy in the right breast. 3. No suspicious lymphadenopathy.   10/05/2020 Surgery   LEFT BREAST LUMPECTOMY WITH RADIOACTIVE SEED AND SENTINEL LYMPH NODE BIOPSY by Dr Alethea Andes    10/05/2020 Pathology Results   FINAL MICROSCOPIC DIAGNOSIS:   A. LYMPH NODE, LEFT AXILLARY #1, SENTINEL, EXCISION:  -  No carcinoma identified in one lymph node (0/1)  -  See comment   B. LYMPH NODE, LEFT AXILLARY, SENTINEL, EXCISION:  -  No carcinoma identified in one lymph node (0/1)  -  See comment   C. LYMPH NODE, LEFT AXILLARY #2, SENTINEL, EXCISION:  -  No carcinoma identified in one lymph node (0/1)  -  See comment   D. BREAST, LEFT, LUMPECTOMY:  -  Invasive lobular carcinoma, Nottingham grade 1 of 3, 2.3 cm  -  Margins uninvolved by carcinoma (<0.1 cm; superior margin)  -  Previous biopsy site changes present  -  See oncology table and comment below    10/05/2020 Oncotype testing   Oncoytpe  Recurrence Score 12 with distant recurrence risk at 9 years with AI or Tamoxifen  at 3%  There is less than 1% benefit of chemotherapy.    11/19/2020 - 12/14/2020 Radiation Therapy   Adjuvant Radiation with Dr Jeryl Moris    12/2020 -  Anti-estrogen oral therapy   Tamoxifen  20mg  daily starting 12/2020    03/13/2021 Survivorship   SCP delivered by Lacie Burton, NP       REVIEW OF SYSTEMS:   Constitutional: Denies fevers, chills or abnormal weight loss Eyes: Denies blurriness of vision Ears, nose, mouth, throat, and face: Denies mucositis or sore throat Respiratory: Denies cough, dyspnea or wheezes Cardiovascular: Denies palpitation, chest discomfort or lower extremity swelling Gastrointestinal:  Denies nausea, heartburn or change in bowel habits Skin: Denies abnormal skin rashes Lymphatics: Denies new lymphadenopathy or easy  bruising Neurological:Denies numbness, tingling or new weaknesses.  She reports increased forgetfulness is a new problem. Behavioral/Psych: Mood is stable, no new changes  All other systems were reviewed with the patient and are negative.   VITALS:   Today's Vitals   03/10/24 0824 03/10/24 0940  BP: 110/60   Pulse: 62   Resp: 17   Temp: 97.8 F (36.6 C)   SpO2: 97%   Weight: 137 lb 8 oz (62.4 kg)   PainSc:  0-No pain   Body mass index is 22.19 kg/m.   Wt Readings from Last 3 Encounters:  03/10/24 137 lb 8 oz (62.4 kg)  11/27/23 136 lb 3.2 oz (61.8 kg)  08/24/23 129 lb 1.6 oz (58.6 kg)    Body mass index is 22.19 kg/m.  Performance status (ECOG): *1 - Symptomatic but completely ambulatory  PHYSICAL EXAM:   GENERAL:alert, no distress and comfortable SKIN: skin color, texture, turgor are normal, no rashes or significant lesions EYES: normal, Conjunctiva  are pink and non-injected, sclera clear OROPHARYNX:no exudate, no erythema and lips, buccal mucosa, and tongue normal  NECK: supple, thyroid  normal size, non-tender, without nodularity LYMPH:  no palpable lymphadenopathy in the cervical, axillary or inguinal LUNGS: clear to auscultation and percussion with normal breathing effort HEART: regular rate & rhythm and no murmurs and no lower extremity edema ABDOMEN:abdomen soft, non-tender and normal bowel sounds Musculoskeletal:no cyanosis of digits and no clubbing  NEURO: alert & oriented x 3 with fluent speech, no focal motor/sensory deficits.   LABORATORY DATA:  I have reviewed the data as listed    Component Value Date/Time   NA 143 03/10/2024 0749   K 4.3 03/10/2024 0749   CL 112 (H) 03/10/2024 0749   CO2 30 03/10/2024 0749   GLUCOSE 90 03/10/2024 0749   BUN 18 03/10/2024 0749   CREATININE 1.14 (H) 03/10/2024 0749   CALCIUM 8.5 (L) 03/10/2024 0749   PROT 5.9 (L) 03/10/2024 0749   ALBUMIN 3.8 03/10/2024 0749   AST 16 03/10/2024 0749   ALT 8 03/10/2024 0749    ALKPHOS 46 03/10/2024 0749   BILITOT 1.4 (H) 03/10/2024 0749   GFRNONAA 49 (L) 03/10/2024 0749   GFRAA 129 05/01/2008 0949    Lab Results  Component Value Date   SPEP Comment 09/09/2022    Lab Results  Component Value Date   WBC 2.4 (L) 03/10/2024   NEUTROABS 1.3 (L) 03/10/2024   HGB 10.8 (L) 03/10/2024   HCT 32.0 (L) 03/10/2024   MCV 99.1 03/10/2024   PLT 128 (L) 03/10/2024

## 2024-03-10 ENCOUNTER — Inpatient Hospital Stay (HOSPITAL_BASED_OUTPATIENT_CLINIC_OR_DEPARTMENT_OTHER): Payer: Medicare Other | Admitting: Nurse Practitioner

## 2024-03-10 ENCOUNTER — Inpatient Hospital Stay: Payer: Medicare Other | Attending: Nurse Practitioner

## 2024-03-10 VITALS — BP 110/60 | HR 62 | Temp 97.8°F | Resp 17 | Wt 137.5 lb

## 2024-03-10 DIAGNOSIS — C50412 Malignant neoplasm of upper-outer quadrant of left female breast: Secondary | ICD-10-CM | POA: Insufficient documentation

## 2024-03-10 DIAGNOSIS — Z170 Estrogen receptor positive status [ER+]: Secondary | ICD-10-CM | POA: Insufficient documentation

## 2024-03-10 DIAGNOSIS — Z79810 Long term (current) use of selective estrogen receptor modulators (SERMs): Secondary | ICD-10-CM | POA: Insufficient documentation

## 2024-03-10 DIAGNOSIS — C91Z0 Other lymphoid leukemia not having achieved remission: Secondary | ICD-10-CM

## 2024-03-10 DIAGNOSIS — Z923 Personal history of irradiation: Secondary | ICD-10-CM | POA: Diagnosis not present

## 2024-03-10 DIAGNOSIS — Z1721 Progesterone receptor positive status: Secondary | ICD-10-CM | POA: Insufficient documentation

## 2024-03-10 DIAGNOSIS — D72819 Decreased white blood cell count, unspecified: Secondary | ICD-10-CM | POA: Diagnosis present

## 2024-03-10 DIAGNOSIS — D61811 Other drug-induced pancytopenia: Secondary | ICD-10-CM | POA: Insufficient documentation

## 2024-03-10 DIAGNOSIS — Z1732 Human epidermal growth factor receptor 2 negative status: Secondary | ICD-10-CM | POA: Insufficient documentation

## 2024-03-10 LAB — CMP (CANCER CENTER ONLY)
ALT: 8 U/L (ref 0–44)
AST: 16 U/L (ref 15–41)
Albumin: 3.8 g/dL (ref 3.5–5.0)
Alkaline Phosphatase: 46 U/L (ref 38–126)
Anion gap: 1 — ABNORMAL LOW (ref 5–15)
BUN: 18 mg/dL (ref 8–23)
CO2: 30 mmol/L (ref 22–32)
Calcium: 8.5 mg/dL — ABNORMAL LOW (ref 8.9–10.3)
Chloride: 112 mmol/L — ABNORMAL HIGH (ref 98–111)
Creatinine: 1.14 mg/dL — ABNORMAL HIGH (ref 0.44–1.00)
GFR, Estimated: 49 mL/min — ABNORMAL LOW (ref >60)
Glucose, Bld: 90 mg/dL (ref 70–99)
Potassium: 4.3 mmol/L (ref 3.5–5.1)
Sodium: 143 mmol/L (ref 135–145)
Total Bilirubin: 1.4 mg/dL — ABNORMAL HIGH (ref 0.0–1.2)
Total Protein: 5.9 g/dL — ABNORMAL LOW (ref 6.5–8.1)

## 2024-03-10 LAB — CBC WITH DIFFERENTIAL (CANCER CENTER ONLY)
Abs Immature Granulocytes: 0 10*3/uL (ref 0.00–0.07)
Basophils Absolute: 0 10*3/uL (ref 0.0–0.1)
Basophils Relative: 2 %
Eosinophils Absolute: 0 10*3/uL (ref 0.0–0.5)
Eosinophils Relative: 1 %
HCT: 32 % — ABNORMAL LOW (ref 36.0–46.0)
Hemoglobin: 10.8 g/dL — ABNORMAL LOW (ref 12.0–15.0)
Immature Granulocytes: 0 %
Lymphocytes Relative: 38 %
Lymphs Abs: 0.9 10*3/uL (ref 0.7–4.0)
MCH: 33.4 pg (ref 26.0–34.0)
MCHC: 33.8 g/dL (ref 30.0–36.0)
MCV: 99.1 fL (ref 80.0–100.0)
Monocytes Absolute: 0.1 10*3/uL (ref 0.1–1.0)
Monocytes Relative: 6 %
Neutro Abs: 1.3 10*3/uL — ABNORMAL LOW (ref 1.7–7.7)
Neutrophils Relative %: 53 %
Platelet Count: 128 10*3/uL — ABNORMAL LOW (ref 150–400)
RBC: 3.23 MIL/uL — ABNORMAL LOW (ref 3.87–5.11)
RDW: 13.4 % (ref 11.5–15.5)
WBC Count: 2.4 10*3/uL — ABNORMAL LOW (ref 4.0–10.5)
nRBC: 0 % (ref 0.0–0.2)

## 2024-03-10 LAB — FERRITIN: Ferritin: 103 ng/mL (ref 11–307)

## 2024-03-11 ENCOUNTER — Other Ambulatory Visit: Payer: Self-pay

## 2024-03-13 ENCOUNTER — Encounter: Payer: Self-pay | Admitting: Nurse Practitioner

## 2024-03-13 NOTE — Assessment & Plan Note (Signed)
-  diagnosed in 08/2020. S/p left lumpectomy with SLNB by Dr Alethea Andes on 10/05/20, pathology showed 2.3 cm lobular carcinoma with clean margins, node negative. Oncotype RS 12, low risk. S/p adjuvant radiation. -she began Tamoxifen  12/2020, tolerating well overall. Held since 08/18/22 for work up of leukopenia -she restarted tamoxifen  in 11/2022 and continues to  tolerate this well.  -most  recent screening mammogram done 07/2023 with benign  results.

## 2024-03-21 ENCOUNTER — Ambulatory Visit: Payer: Self-pay

## 2024-03-21 NOTE — Telephone Encounter (Signed)
 Will see her then

## 2024-03-21 NOTE — Telephone Encounter (Signed)
  Chief Complaint: Foot and ankle swelling Symptoms: Mild left foot and ankle swelling Frequency: brought to patient's attention yesterday Pertinent Negatives: Patient denies CP, SOB Disposition: [] ED /[] Urgent Care (no appt availability in office) / [x] Appointment(In office/virtual)/ []  [facility]/ [] Home Care/ [] Refused Recommended Disposition /[] [facility]/ []  Follow-up with PCP Additional Notes: patient reports left foot and ankle swelling. Patient states her daughter brought it to her attention yesterday. Swelling is mild in nature. Patient denies any pain. NO CP or SOB. Per protocol, patient is recommended to be seen within 3 days. Appointment scheduled for tomorrow at 12:30 PM with PCP. Patient verbalized understanding of the plan and all questions answered.    Copied from CRM [id number]. Topic: Clinical - Red Word Triage >> Mar 21, 2024  9:09 AM Kevelyn M wrote: Red Word that prompted transfer to Nurse Triage: Patient stated that someone else noticed that the patient's Left foot was swelling starting yesterday and propped up foot yesterday. Swelling improved a little bit but is still concerned. Reason for Disposition  MILD or MODERATE ankle swelling (e.g., can't move joint normally, can't do usual activities) (Exceptions: Itchy, localized swelling; swelling is chronic.)  Answer Assessment - Initial Assessment Questions 1. LOCATION: "Which ankle is swollen?" "Where is the swelling?"     Left ankle-foot and ankle 2. ONSET: "When did the swelling start?"     Swelling was noticed yesterday 3. SWELLING: "How bad is the swelling?" Or, "How large is it?" (e.g., mild, moderate, severe; size of localized swelling)    - NONE: No joint swelling.   - LOCALIZED: Localized; small area of puffy or swollen skin (e.g., insect bite, skin irritation).   - MILD: Joint looks or feels mildly swollen or puffy.   - MODERATE: Swollen; interferes with normal activities (e.g., work or  school); decreased range of movement; may be limping.   - SEVERE: Very swollen; can't move swollen joint at all; limping a lot or unable to walk.     Mild 4. PAIN: "Is there any pain?" If Yes, ask: "How bad is it?" (Scale 1-10; or mild, moderate, severe)   - NONE (0): no pain.   - MILD (1-3): doesn't interfere with normal activities.    - MODERATE (4-7): interferes with normal activities (e.g., work or school) or awakens from sleep, limping.    - SEVERE (8-10): excruciating pain, unable to do any normal activities, unable to walk.      no 5. CAUSE: "What do you think caused the ankle swelling?"     unsure 6. OTHER SYMPTOMS: "Do you have any other symptoms?" (e.g., fever, chest pain, difficulty breathing, calf pain)     No  Protocols used: Ankle Swelling-A-AH

## 2024-03-21 NOTE — Telephone Encounter (Signed)
 Appt scheduled tomorrow at 12:30 with Dr. Malissa Se

## 2024-03-22 ENCOUNTER — Encounter: Payer: Self-pay | Admitting: Family Medicine

## 2024-03-22 ENCOUNTER — Telehealth: Payer: Self-pay | Admitting: *Deleted

## 2024-03-22 ENCOUNTER — Ambulatory Visit (INDEPENDENT_AMBULATORY_CARE_PROVIDER_SITE_OTHER): Admitting: Family Medicine

## 2024-03-22 VITALS — BP 124/78 | HR 73 | Temp 98.3°F | Ht 66.0 in | Wt 136.6 lb

## 2024-03-22 DIAGNOSIS — M25472 Effusion, left ankle: Secondary | ICD-10-CM | POA: Diagnosis not present

## 2024-03-22 DIAGNOSIS — L719 Rosacea, unspecified: Secondary | ICD-10-CM | POA: Insufficient documentation

## 2024-03-22 DIAGNOSIS — M25473 Effusion, unspecified ankle: Secondary | ICD-10-CM | POA: Insufficient documentation

## 2024-03-22 NOTE — Telephone Encounter (Signed)
 [employer] or [employer] or other  For what condition or just screening?

## 2024-03-22 NOTE — Assessment & Plan Note (Signed)
 Pt had ankle swelling-worse on left over the weekend Today-resolved with normal exam Has notable distended veins in feet and varicosities in ankles -suspect this is from venous insufficiency  Made recommendations to prevent re occurrence  Supp hose daily  Elevation when sitting  Avoid excessive sodium Avoid excessive standing and heat   Call back and Er precautions noted in detail today  -see AVS  Update if symptoms return/worsen and do not improve

## 2024-03-22 NOTE — Telephone Encounter (Signed)
 I put the referral in  Please let us  know if you don't hear in 1-2 weeks to set this up   It will take quite a while to get in

## 2024-03-22 NOTE — Progress Notes (Signed)
 Subjective:    Patient ID: Jennifer Sellers, female    DOB: January 14, 1946, 78 y.o.   MRN: [id number]  HPI  Wt Readings from Last 3 Encounters:  03/22/24 136 lb 9.6 oz (62 kg)  03/10/24 137 lb 8 oz (62.4 kg)  11/27/23 136 lb 3.2 oz (61.8 kg)   22.05 kg/m  Vitals:   03/22/24 1217  BP: 124/78  Pulse: 73  Temp: 98.3 F (36.8 C)  SpO2: 97%   Pt presents for ankle swelling   Left foot and ankle swelling Relatively mild but daughter noticed it this weekend  No pain  No pain in calf or leg   No recent prolonged sitting or travel lately    No trauma to ankle or foot  Does yard work regularly - does mow (wears her supp hose)    Bad over the weekend and better today A little tingly  Has varicose veins in both feet and ankles   Usually wears supp hose every day - to the waist    Has history of RA and pancytopenia/ LGLL (taking trexall )  On methotrexate     Takex tamoxifen    Lab Results  Component Value Date   WBC 2.4 (L) 03/10/2024   HGB 10.8 (L) 03/10/2024   HCT 32.0 (L) 03/10/2024   MCV 99.1 03/10/2024   PLT 128 (L) 03/10/2024   Lab Results  Component Value Date   NA 143 03/10/2024   K 4.3 03/10/2024   CO2 30 03/10/2024   GLUCOSE 90 03/10/2024   BUN 18 03/10/2024   CREATININE 1.14 (H) 03/10/2024   CALCIUM 8.5 (L) 03/10/2024   GFR 51.19 (L) 06/22/2023   GFRNONAA 49 (L) 03/10/2024   Lab Results  Component Value Date   ALT 8 03/10/2024   AST 16 03/10/2024   ALKPHOS 46 03/10/2024   BILITOT 1.4 (H) 03/10/2024    Lab Results  Component Value Date   TSH 1.28 06/22/2023   No nsaids recently   Patient Active Problem List   Diagnosis Date Noted   Ankle swelling 03/22/2024   Colon cancer screening 06/24/2023   Allergic dermatitis 05/28/2023   Vision changes 02/18/2023   Cognitive decline 02/18/2023   Grief 02/18/2023   Short-term memory loss 02/18/2023   Fatigue 02/18/2023   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   Large granular  lymphocyte leukemia 10/22/2022   Other pancytopenia (HCC) 09/10/2022   Malignant neoplasm of upper-outer quadrant of left breast in female, 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   Estrogen deficiency 05/24/2019   H/O adenomatous polyp of colon 03/11/2018   Vitamin D  deficiency 08/03/2015   Routine general medical examination at a health care facility 05/15/2015   Rheumatoid arthritis (HCC) 06/19/2014   Thrombocytopenia (HCC) 06/19/2014   Encounter for Medicare annual wellness exam 11/11/2013   Hyperbilirubinemia 08/19/2013   Hypothyroid 09/21/2012   GOUTY TOPHI 10/23/2010   OTHER SYMPTOMS REFERABLE JOINT SITE UNSPECIFIED 10/23/2010   CARPAL TUNNEL SYNDROME, HX OF 04/05/2009   Leukocytopenia 05/02/2008   INSOMNIA, TRANSIENT 04/27/2007   Osteopenia 04/27/2007   Hyperlipidemia 04/23/2007   Diaphragmatic hernia 04/23/2007   POSTMENOPAUSAL STATUS 04/23/2007   RHEUMATOID FACTOR, POSITIVE 04/23/2007   Past Medical History:  Diagnosis Date   Arthritis    rheumatoid arthritis   Hypothyroidism    Leukopenia    Thyroid  disease    TIA (transient ischemic attack) 2011   Past Surgical History:  Procedure Laterality Date   ABDOMINAL HYSTERECTOMY     BREAST LUMPECTOMY WITH RADIOACTIVE SEED  AND SENTINEL LYMPH NODE BIOPSY Left 10/05/2020   Procedure: LEFT BREAST LUMPECTOMY WITH RADIOACTIVE SEED AND SENTINEL LYMPH NODE BIOPSY;  Surgeon: Caralyn Chandler, MD;  Location: [facility];  Service: General;  Laterality: Left;  GENERAL AND PECTORAL BLOCK   CARPAL TUNNEL RELEASE     COLONOSCOPY     COLONOSCOPY WITH PROPOFOL  N/A 06/30/2018   Procedure: COLONOSCOPY WITH PROPOFOL ;  Surgeon: Cassie Click, MD;  Location: [facility];  Service: Endoscopy;  Laterality: N/A;   colpo-ureteroscopy     lens surgery     Social History   Tobacco Use   Smoking status: Never   Smokeless tobacco: Never  Vaping Use   Vaping status: Never Used  Substance Use Topics   Alcohol use: Yes     Alcohol/week: 0.0 standard drinks of alcohol    Comment: occasional   Drug use: No   Family History  Problem Relation Age of Onset   Cancer Mother        lymphoma   Cancer Brother        lymphoma   Cancer Daughter        NHL   Allergies  Allergen Reactions   Misc. Sulfonamide Containing Compounds Rash    REACTION: reaction  not known REACTION: reaction not known   Cipro [Ciprofloxacin Hcl] Other (See Comments)    Unknown    Cortisporin [Bacitra-Neomycin -Polymyxin-Hc]     Burning / swelling of eyes with eye drops   Prevnar [Pneumococcal 13-Val Conj Vacc] Other (See Comments)    Severe local reaction    Sulfa Antibiotics    Sulfonamide Derivatives     REACTION: reaction not known   Current Outpatient Medications on File Prior to Visit  Medication Sig Dispense Refill   folic acid  (FOLVITE ) 1 MG tablet TAKE 1 TABLET BY MOUTH EVERY DAY 90 tablet 1   levothyroxine  (SYNTHROID ) 75 MCG tablet TAKE 1 TABLET BY MOUTH DAILY BEFORE BREAKFAST. 90 tablet 2   methotrexate  (RHEUMATREX) 2.5 MG tablet TAKE 1 TAB EVERY WEEK WITH 15MG  12 tablet 1   Multiple Vitamins-Minerals (PRESERVISION AREDS 2 PO) Take 2 capsules by mouth daily.      NON FORMULARY 2 drops daily. sustane     Prenatal Multivit w/ Iron-FA (NEONATAL FE PO) Take by mouth.     tamoxifen  (NOLVADEX ) 20 MG tablet TAKE 1 TABLET BY MOUTH EVERY DAY 90 tablet 1   TREXALL  15 MG tablet TAKE 1 PILL (15 MG) WITH 2.5 MG TAB ( METHOTREXATE ) FOR TOTAL DOSE OF 17.5MG , ONCE A WEEK ON SUNDAY 4 tablet 3   triamcinolone  cream (KENALOG ) 0.5 % Apply 1 Application topically 2 (two) times daily. 15 g 0   No current facility-administered medications on file prior to visit.    Review of Systems  Constitutional:  Negative for activity change, appetite change, fatigue, fever and unexpected weight change.  HENT:  Negative for congestion, ear pain, rhinorrhea, sinus pressure and sore throat.   Eyes:  Negative for pain, redness and visual disturbance.   Respiratory:  Negative for cough, shortness of breath and wheezing.   Cardiovascular:  Positive for leg swelling. Negative for chest pain and palpitations.  Gastrointestinal:  Negative for abdominal pain, blood in stool, constipation and diarrhea.  Endocrine: Negative for polydipsia and polyuria.  Genitourinary:  Negative for dysuria, frequency and urgency.  Musculoskeletal:  Negative for arthralgias, back pain and myalgias.  Skin:  Negative for pallor and rash.  Allergic/Immunologic: Negative for environmental allergies.  Neurological:  Negative  for dizziness, syncope and headaches.  Hematological:  Negative for adenopathy. Does not bruise/bleed easily.  Psychiatric/Behavioral:  Negative for decreased concentration and dysphoric mood. The patient is not nervous/anxious.        Objective:   Physical Exam Constitutional:      General: She is not in acute distress.    Appearance: Normal appearance. She is well-developed and normal weight. She is not ill-appearing or diaphoretic.  HENT:     Head: Normocephalic and atraumatic.     Mouth/Throat:     Mouth: Mucous membranes are moist.  Eyes:     General: No scleral icterus.       Right eye: No discharge.        Left eye: No discharge.     Conjunctiva/sclera: Conjunctivae normal.     Pupils: Pupils are equal, round, and reactive to light.  Neck:     Thyroid : No thyromegaly.     Vascular: No carotid bruit or JVD.  Cardiovascular:     Rate and Rhythm: Normal rate and regular rhythm.     Pulses: Normal pulses.     Heart sounds: Normal heart sounds.     No gallop.  Pulmonary:     Effort: Pulmonary effort is normal. No respiratory distress.     Breath sounds: Normal breath sounds. No wheezing or rales.  Abdominal:     General: There is no distension or abdominal bruit.     Palpations: Abdomen is soft.  Musculoskeletal:     Cervical back: Normal range of motion and neck supple.     Right lower leg: No edema.     Left lower leg: No  edema.     Comments: No pedal or ankle edema today  Prominent veins in both feet and some varicosities in ankles (large and spider) No tenderness of ankles/feet/legs No palp cords or warmth or masses  Neg homan's sign   Normal rom ankles nad feet   Lymphadenopathy:     Cervical: No cervical adenopathy.  Skin:    General: Skin is warm and dry.     Coloration: Skin is not pale.     Findings: No rash.  Neurological:     Mental Status: She is alert.     Motor: No weakness.     Coordination: Coordination normal.     Deep Tendon Reflexes: Reflexes are normal and symmetric. Reflexes normal.  Psychiatric:        Mood and Affect: Mood normal.           Assessment & Plan:   Problem List Items Addressed This Visit       Other   Ankle swelling - Primary   Pt had ankle swelling-worse on left over the weekend Today-resolved with normal exam Has notable distended veins in feet and varicosities in ankles -suspect this is from venous insufficiency  Made recommendations to prevent re occurrence  Supp hose daily  Elevation when sitting  Avoid excessive sodium Avoid excessive standing and heat   Call back and Er precautions noted in detail today  -see AVS  Update if symptoms return/worsen and do not improve

## 2024-03-22 NOTE — Telephone Encounter (Signed)
 Called and spoke with pt pertaining to referral for dermatology. Pt states she would rather look for someone in [place] for her Rosacea.

## 2024-03-22 NOTE — Patient Instructions (Signed)
 I think your foot /ankle swelling was from venous insufficiency (varicose veins)  Today exam is normal   Wear your support hose during the day Try and elevate feet when you sit  If you are up on your feet , keep moving /try to avoid standing still  Warm weather will make you swell more also  Excess sodium in your diet (salt) can also   Watch for  Pain in legs/ankles or feet  Knot or hot area on leg or foot  Shortness of breath or chest pain   Return of the swelling that does not get better

## 2024-03-22 NOTE — Telephone Encounter (Signed)
 Copied from CRM [id number]. Topic: Referral - Request for Referral >> Mar 22, 2024  3:43 PM [place] M wrote: Reason for CRM: Patient wanting to know if Dr Malissa Se can recommend a dermatologist. She forgot  to ask during the appointment today.

## 2024-03-23 NOTE — Telephone Encounter (Signed)
 Will route back to referral dpt. Please see comments on referral. Looks like provider isn't accepting new pt. Please review

## 2024-04-16 ENCOUNTER — Other Ambulatory Visit: Payer: Self-pay | Admitting: Nurse Practitioner

## 2024-04-16 DIAGNOSIS — Z170 Estrogen receptor positive status [ER+]: Secondary | ICD-10-CM

## 2024-05-09 ENCOUNTER — Other Ambulatory Visit: Payer: Self-pay | Admitting: Hematology

## 2024-05-09 ENCOUNTER — Telehealth: Payer: Self-pay

## 2024-05-09 ENCOUNTER — Other Ambulatory Visit: Payer: Self-pay

## 2024-05-09 NOTE — Telephone Encounter (Signed)
 Received telephone call from the pharmacist at [facility] regarding patient's folic acid  med dose. Pharmacist states patient has been taking 2 tabs PO daily x 1 year and the correct dosage is 1 tab PO daily. Pharmacist would like to make Dr. Lanny aware and f/u with her (pharmacist) on how she wants to proceed. Pharmacist stated patient has completed the 90-day Rx that was sent, May 2025. Let pharmacist know that I would make Dr. Lanny aware to further advise. Pharmacist agreed to plan.

## 2024-05-22 ENCOUNTER — Other Ambulatory Visit: Payer: Self-pay | Admitting: Hematology

## 2024-05-23 ENCOUNTER — Other Ambulatory Visit: Payer: Self-pay

## 2024-05-23 ENCOUNTER — Other Ambulatory Visit (HOSPITAL_COMMUNITY): Payer: Self-pay

## 2024-05-23 ENCOUNTER — Telehealth: Payer: Self-pay | Admitting: Pharmacy Technician

## 2024-05-23 MED ORDER — METHOTREXATE SODIUM 2.5 MG PO TABS: 2.5000 mg | ORAL_TABLET | ORAL | 1 refills | Status: DC

## 2024-05-23 NOTE — Telephone Encounter (Signed)
 Oral Oncology Patient Advocate Encounter   Received notification that prior authorization for Trexall  & methotrexate  2.5mg  tabs is required.   PA submitted on 05/23/24 Key [id number] (Trexall ) Key [id number] (generic methotrexate ) Status is pending     Estefana Sox, CPhT-Adv Oncology Pharmacy Patient Advocate [facility]  Direct Number: [phone]  Fax: [phone]

## 2024-05-23 NOTE — Telephone Encounter (Signed)
 Oral Oncology Patient Advocate Encounter  Prior Authorization for Trexall  / methotrexate  has been approved.    PA# [id number] Effective dates: 05/23/24 through 11/16/24  Patients co-pay is $.55.65 (Trexall ) $2.02 (generimethotrexate  2.5mg  tabs)    Estefana Sox, CPhT-Adv Oncology Pharmacy Patient Advocate [facility]  Direct Number: [phone]  Fax: [phone]

## 2024-05-26 ENCOUNTER — Other Ambulatory Visit: Payer: Self-pay | Admitting: Family Medicine

## 2024-05-26 NOTE — Telephone Encounter (Signed)
 Pt is due for her CPE (labs prior) on or after 06/24/24, please scheduled. Med refilled once

## 2024-06-08 NOTE — Assessment & Plan Note (Signed)
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  -bone marrow biopsy on 09/25/22 showed: Hypercellular bone marrow with an atypical T-cell lymphocytosis (30% to 60%; aspirate versus flow cytometry) consisting predominantly of CD8 positive T cells with dim CD5. Morphologically large granular lymphocytes are not detected; however, the phenotype and clinical history of rheumatoid arthritis suggest possible large granular lymphocytosis.  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  -further molecular testing showed T-cell garma gene rearrangement positive, and LGL flow cytometry showed increased T-cell large granular lymphocyte (28.6% total cells), supporting the diagnosis of LGL -she started MTX at low dose 5mg  wekely on 12/14/2022, she is tolerating it well.  Plan to increase dose to 50 mg weekly from next dose -I have referred her to LGL leukemia expert Dr. Wallis Mart at [facility] and she was seen on 02/10/23. Her diagnose was confirmed and Dr. Wallis Mart also recommend MTX with full dose 17.5mg  (10mg /m2) weekly.

## 2024-06-09 ENCOUNTER — Inpatient Hospital Stay

## 2024-06-09 ENCOUNTER — Inpatient Hospital Stay: Attending: Hematology | Admitting: Hematology

## 2024-06-09 VITALS — BP 90/60 | HR 70 | Temp 97.6°F | Resp 16 | Ht 66.0 in | Wt 133.7 lb

## 2024-06-09 DIAGNOSIS — D649 Anemia, unspecified: Secondary | ICD-10-CM

## 2024-06-09 DIAGNOSIS — M8580 Other specified disorders of bone density and structure, unspecified site: Secondary | ICD-10-CM | POA: Diagnosis not present

## 2024-06-09 DIAGNOSIS — Z1721 Progesterone receptor positive status: Secondary | ICD-10-CM | POA: Insufficient documentation

## 2024-06-09 DIAGNOSIS — R059 Cough, unspecified: Secondary | ICD-10-CM | POA: Diagnosis not present

## 2024-06-09 DIAGNOSIS — R413 Other amnesia: Secondary | ICD-10-CM | POA: Diagnosis not present

## 2024-06-09 DIAGNOSIS — Z170 Estrogen receptor positive status [ER+]: Secondary | ICD-10-CM | POA: Insufficient documentation

## 2024-06-09 DIAGNOSIS — C91Z0 Other lymphoid leukemia not having achieved remission: Secondary | ICD-10-CM | POA: Diagnosis not present

## 2024-06-09 DIAGNOSIS — C50412 Malignant neoplasm of upper-outer quadrant of left female breast: Secondary | ICD-10-CM | POA: Insufficient documentation

## 2024-06-09 DIAGNOSIS — R0982 Postnasal drip: Secondary | ICD-10-CM | POA: Diagnosis not present

## 2024-06-09 DIAGNOSIS — Z79810 Long term (current) use of selective estrogen receptor modulators (SERMs): Secondary | ICD-10-CM | POA: Insufficient documentation

## 2024-06-09 DIAGNOSIS — R42 Dizziness and giddiness: Secondary | ICD-10-CM | POA: Insufficient documentation

## 2024-06-09 DIAGNOSIS — Z1732 Human epidermal growth factor receptor 2 negative status: Secondary | ICD-10-CM | POA: Diagnosis not present

## 2024-06-09 DIAGNOSIS — Z923 Personal history of irradiation: Secondary | ICD-10-CM | POA: Insufficient documentation

## 2024-06-09 LAB — COMPREHENSIVE METABOLIC PANEL WITH GFR
ALT: 8 U/L (ref 0–44)
AST: 17 U/L (ref 15–41)
Albumin: 3.7 g/dL (ref 3.5–5.0)
Alkaline Phosphatase: 44 U/L (ref 38–126)
Anion gap: 3 — ABNORMAL LOW (ref 5–15)
BUN: 16 mg/dL (ref 8–23)
CO2: 29 mmol/L (ref 22–32)
Calcium: 8.3 mg/dL — ABNORMAL LOW (ref 8.9–10.3)
Chloride: 111 mmol/L (ref 98–111)
Creatinine, Ser: 1.1 mg/dL — ABNORMAL HIGH (ref 0.44–1.00)
GFR, Estimated: 51 mL/min — ABNORMAL LOW (ref >60)
Glucose, Bld: 92 mg/dL (ref 70–99)
Potassium: 4.3 mmol/L (ref 3.5–5.1)
Sodium: 143 mmol/L (ref 135–145)
Total Bilirubin: 1.1 mg/dL (ref 0.0–1.2)
Total Protein: 6 g/dL — ABNORMAL LOW (ref 6.5–8.1)

## 2024-06-09 LAB — CBC WITH DIFFERENTIAL/PLATELET
Abs Immature Granulocytes: 0.01 K/uL (ref 0.00–0.07)
Basophils Absolute: 0 K/uL (ref 0.0–0.1)
Basophils Relative: 0 %
Eosinophils Absolute: 0 K/uL (ref 0.0–0.5)
Eosinophils Relative: 1 %
HCT: 32.7 % — ABNORMAL LOW (ref 36.0–46.0)
Hemoglobin: 11.1 g/dL — ABNORMAL LOW (ref 12.0–15.0)
Immature Granulocytes: 0 %
Lymphocytes Relative: 28 %
Lymphs Abs: 0.6 K/uL — ABNORMAL LOW (ref 0.7–4.0)
MCH: 34 pg (ref 26.0–34.0)
MCHC: 33.9 g/dL (ref 30.0–36.0)
MCV: 100.3 fL — ABNORMAL HIGH (ref 80.0–100.0)
Monocytes Absolute: 0.1 K/uL (ref 0.1–1.0)
Monocytes Relative: 5 %
Neutro Abs: 1.5 K/uL — ABNORMAL LOW (ref 1.7–7.7)
Neutrophils Relative %: 66 %
Platelets: 140 K/uL — ABNORMAL LOW (ref 150–400)
RBC: 3.26 MIL/uL — ABNORMAL LOW (ref 3.87–5.11)
RDW: 13.2 % (ref 11.5–15.5)
WBC: 2.3 K/uL — ABNORMAL LOW (ref 4.0–10.5)
nRBC: 0 % (ref 0.0–0.2)

## 2024-06-09 LAB — FERRITIN: Ferritin: 127 ng/mL (ref 11–307)

## 2024-06-09 NOTE — Progress Notes (Signed)
 [facility]   [phone] [phone]   Clinic Follow up Note   Patient Care Team: Tower, Laine LABOR, MD as PCP - General Lad, Donivan Barry, MD as Referring Physician (Ophthalmology) [name], MD as Referring Physician (Ophthalmology) [name], DC as Consulting Physician (Chiropractic Medicine) [name], RN (Inactive) as Oncology Nurse Navigator [name], RN as Oncology Nurse Navigator [name], MD as Consulting Physician (General Surgery) [name], MD as Consulting Physician (Hematology) [name], MD as Consulting Physician (Radiation Oncology) Burton, [name], NP as Nurse Practitioner (Nurse Practitioner) [name]., MD as Consulting Physician (Hematology and Oncology)  Date of Service:  06/09/2024  CHIEF COMPLAINT: f/u of LGL leukemia and breast cancer  CURRENT THERAPY:  Methotrexate  17.5 mg weekly Tamoxifen  20 mg daily  Oncology History   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  -bone marrow biopsy on 09/25/22 showed: Hypercellular bone marrow with an atypical T-cell lymphocytosis (30% to 60%; aspirate versus flow cytometry) consisting predominantly of CD8 positive T cells with dim CD5. Morphologically large granular lymphocytes are not detected; however, the phenotype and clinical history of rheumatoid arthritis suggest possible large granular lymphocytosis.  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  -further molecular testing showed T-cell garma gene rearrangement positive, and LGL flow cytometry showed increased T-cell large granular lymphocyte (28.6% total cells), supporting  the diagnosis of LGL -she started MTX at low dose 5mg  wekely on 12/14/2022, she is tolerating it well.  Plan to increase dose to 50 mg weekly from next dose -I have referred her to LGL leukemia expert Dr. Lamona at [facility] and she was seen on 02/10/23. Her diagnose was confirmed and Dr. Lamona also recommend MTX with full dose 17.5mg  (10mg /m2) weekly.   Malignant neoplasm of upper-outer quadrant of left breast in female, estrogen receptor positive (HCC) -diagnosed in 08/2020. 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 Oncotype RS 12, low risk. S/p adjuvant radiation. -she began Tamoxifen  12/2020, tolerating well overall. Held since 08/18/22 for work up of leukopenia -she restarted tamoxifen  in 11/2022, plan for 5 years   Assessment & Plan Large granular lymphocytic leukemia Well-managed on methotrexate  since January 2024 with stable blood counts and no significant side effects. - Continue methotrexate  17.5 mg weekly. - Order complete blood count. - Follow up in 3 months.  Breast cancer, stage 1, on tamoxifen  Stage 1, low grade with low recurrence score, diagnosed in 2022. On tamoxifen  since 2022, planned for at least 5 years. Discussed potential cognitive side effects, but no significant issues warranting discontinuation. - Continue tamoxifen  until at least January 2027. - Schedule annual mammogram in September 2025. - Consider earlier discontinuation of tamoxifen  if cognitive side effects worsen.  Osteopenia with high risk for hip fracture Osteopenia with high risk for hip fracture, common postmenopausal. Uncertain if current vitamin regimen includes adequate calcium and vitamin D . - Ensure intake of calcium and vitamin D , with at least 1000 units of vitamin D  daily.  Dizziness Recent episode possibly related to dehydration or hypotension. Blood pressure low when supine but not standing. Likely exacerbated  by  heat and outdoor activity. No falls reported. - Increase fluid intake, including sports drinks for electrolytes. - Add salt to diet to help maintain blood pressure. - Sit or lie down if dizziness recurs.  Cough with possible allergic component Cough for approximately 6 months, likely related to post-nasal drip or allergies. No deep chest involvement. Possible environmental triggers from outdoor activities. - Use over-the-counter Flonase nasal spray for post-nasal drip. - Wear a mask during outdoor activities to reduce exposure to allergens and dust. - Consider chest x-ray or CT scan if cough worsens.  Plan - She is clinically stable, due to concern about her memory loss - Continue tamoxifen  and methotrexate  -Encouraged her to drink more electrolyte water, ensure adequate calcium and vitamin D  supplement - Will review her lab later today, and call her if there are any concerns. - Plan follow-up in 3 months   SUMMARY OF ONCOLOGIC HISTORY: Oncology History Overview Note  Cancer Staging Malignant neoplasm of upper-outer quadrant of left breast in female, estrogen receptor positive (HCC) Staging form: Breast, AJCC 8th Edition - Clinical stage from 08/29/2020: Stage IA (cT1c, cN0, cM0, G1, ER+, PR+, HER2-) - Unsigned - Pathologic stage from 10/31/2020: Stage IA (pT2, pN0(sn), cM0, G1, ER+, PR+, HER2-, Oncotype DX score: 12) - Unsigned    Malignant neoplasm of upper-outer quadrant of left breast in female, estrogen receptor positive (HCC)  08/02/2020 Mammogram   IMPRESSION The 1cm (0.9x1.2x0.9cm on US )asymmetry in the left breast at 1:00 position, 4 cmfn is indeterminate   08/22/2020 Initial Biopsy   Diagnosis 08/22/20 Breast, left, needle core biopsy, 1 o'clock, 4cmfn -INVASIVE MAMMARY CARCINOMA -SEE COMMENT       -Grade 1 or 3  E-cadherin is NEGATIVE supporting lobular origin   08/22/2020 Receptors her2   ER - 95%  PR - 25%  Ki67 - 10% HER2 Negative on FISH   08/24/2020  Initial Diagnosis   Malignant neoplasm of upper-outer quadrant of left breast in female, estrogen receptor positive (HCC)   08/29/2020 Breast MRI   IMPRESSION: 1. Enhancing mass in the upper slightly outer left breast measuring approximately 2.4 x 1.6 x 1.8 cm, consistent with the patient's known malignancy. 2. No MRI evidence of malignancy in the right breast. 3. No suspicious lymphadenopathy.   10/05/2020 Surgery   LEFT BREAST LUMPECTOMY WITH RADIOACTIVE SEED AND SENTINEL LYMPH NODE BIOPSY by Dr Curvin    10/05/2020 Pathology Results   FINAL MICROSCOPIC DIAGNOSIS:   A. LYMPH NODE, LEFT AXILLARY #1, SENTINEL, EXCISION:  -  No carcinoma identified in one lymph node (0/1)  -  See comment   B. LYMPH NODE, LEFT AXILLARY, SENTINEL, EXCISION:  -  No carcinoma identified in one lymph node (0/1)  -  See comment   C. LYMPH NODE, LEFT AXILLARY #2, SENTINEL, EXCISION:  -  No carcinoma identified in one lymph node (0/1)  -  See comment   D. BREAST, LEFT, LUMPECTOMY:  -  Invasive lobular carcinoma, Nottingham grade 1 of 3, 2.3 cm  -  Margins uninvolved by carcinoma (<0.1 cm; superior margin)  -  Previous biopsy site changes present  -  See oncology table and comment below    10/05/2020 Oncotype testing   Oncoytpe  Recurrence Score 12 with distant recurrence risk at 9 years with AI or Tamoxifen  at 3%  There is less than 1% benefit of chemotherapy.    11/19/2020 - 12/14/2020 Radiation Therapy   Adjuvant Radiation with Dr Dewey    12/2020 -  Anti-estrogen oral therapy  Tamoxifen  20mg  daily starting 12/2020    03/13/2021 Survivorship   SCP delivered by Lacie Burton, NP      Discussed the use of AI scribe software for clinical note transcription with the patient, who gave verbal consent to proceed.  History of Present Illness Jennifer Sellers is an 78 year old female with large granular lymphocytic leukemia who presents for follow-up.  She experienced an episode of dizziness last week,  described as 'seeing black' and feeling as though she might pass out, without loss of consciousness or falling. The episode was brief, occurred while standing, possibly after bending down, and has not recurred. She is not on blood pressure medication and maintains hydration with water.  Her current medications include methotrexate , 15 mg plus 2.5 mg, once a week on Sundays, and tamoxifen  for breast cancer diagnosed in 2022. Methotrexate  was started in January 2024.  She has a cough present for about six months, characterized by throat clearing, with some post-nasal drip and allergies. She wears a mask while mowing the lawn to reduce dust exposure.  She has a history of stage one, low-grade breast cancer with a low recurrence score, diagnosed in 2022. She underwent a bone density scan last year. She takes various vitamins but is unsure if they include calcium or vitamin D .     All other systems were reviewed with the patient and are negative.  MEDICAL HISTORY:  Past Medical History:  Diagnosis Date   Arthritis    rheumatoid arthritis   Hypothyroidism    Leukopenia    Thyroid  disease    TIA (transient ischemic attack) 2011    SURGICAL HISTORY: Past Surgical History:  Procedure Laterality Date   ABDOMINAL HYSTERECTOMY     BREAST LUMPECTOMY WITH RADIOACTIVE SEED AND SENTINEL LYMPH NODE BIOPSY Left 10/05/2020   Procedure: LEFT BREAST LUMPECTOMY WITH RADIOACTIVE SEED AND SENTINEL LYMPH NODE BIOPSY;  Surgeon: [name], MD;  Location: [facility];  Service: General;  Laterality: Left;  GENERAL AND PECTORAL BLOCK   CARPAL TUNNEL RELEASE     COLONOSCOPY     COLONOSCOPY WITH PROPOFOL  N/A 06/30/2018   Procedure: COLONOSCOPY WITH PROPOFOL ;  Surgeon: Viktoria Lamar DASEN, MD;  Location: [facility];  Service: Endoscopy;  Laterality: N/A;   colpo-ureteroscopy     lens surgery      I have reviewed the social history and family history with the patient and they are unchanged  from previous note.  ALLERGIES:  is allergic to misc. sulfonamide containing compounds, cipro [ciprofloxacin hcl], cortisporin [bacitra-neomycin -polymyxin-hc], prevnar [pneumococcal 13-val conj vacc], sulfa antibiotics, and sulfonamide derivatives.  MEDICATIONS:  Current Outpatient Medications  Medication Sig Dispense Refill   folic acid  (FOLVITE ) 1 MG tablet TAKE 1 TABLET BY MOUTH EVERY DAY 90 tablet 1   levothyroxine  (SYNTHROID ) 75 MCG tablet TAKE 1 TABLET BY MOUTH EVERY DAY BEFORE BREAKFAST 90 tablet 0   methotrexate  (RHEUMATREX) 2.5 MG tablet Take 1 tablet (2.5 mg total) by mouth once a week. Caution:Chemotherapy. Protect from light. 12 tablet 1   Multiple Vitamins-Minerals (PRESERVISION AREDS 2 PO) Take 2 capsules by mouth daily.      NON FORMULARY 2 drops daily. sustane     Prenatal Multivit w/ Iron-FA (NEONATAL FE PO) Take by mouth.     tamoxifen  (NOLVADEX ) 20 MG tablet TAKE 1 TABLET BY MOUTH EVERY DAY 90 tablet 1   TREXALL  15 MG tablet TAKE 1 PILL (15 MG) WITH 2.5 MG TAB ( METHOTREXATE ) FOR TOTAL DOSE OF 17.5MG , ONCE  A WEEK ON SUNDAY 4 tablet 3   triamcinolone  cream (KENALOG ) 0.5 % Apply 1 Application topically 2 (two) times daily. 15 g 0   No current facility-administered medications for this visit.    PHYSICAL EXAMINATION: ECOG PERFORMANCE STATUS: 1 - Symptomatic but completely ambulatory  Vitals:   06/09/24 0842 06/09/24 0850  BP: 110/60 90/60  Pulse: 70   Resp: 16   Temp: 97.6 F (36.4 C)   SpO2: 97%    Wt Readings from Last 3 Encounters:  06/09/24 133 lb 11.2 oz (60.6 kg)  03/22/24 136 lb 9.6 oz (62 kg)  03/10/24 137 lb 8 oz (62.4 kg)     GENERAL:alert, no distress and comfortable SKIN: skin color, texture, turgor are normal, no rashes or significant lesions EYES: normal, Conjunctiva are pink and non-injected, sclera clear NECK: supple, thyroid  normal size, non-tender, without nodularity LYMPH:  no palpable lymphadenopathy in the cervical, axillary  LUNGS:  clear to auscultation and percussion with normal breathing effort HEART: regular rate & rhythm and no murmurs and no lower extremity edema ABDOMEN:abdomen soft, non-tender and normal bowel sounds Musculoskeletal:no cyanosis of digits and no clubbing  NEURO: alert & oriented x 3 with fluent speech, no focal motor/sensory deficits  Physical Exam   LABORATORY DATA:  I have reviewed the data as listed    Latest Ref Rng & Units 03/10/2024    7:49 AM 11/27/2023    8:56 AM 08/24/2023    8:51 AM  CBC  WBC 4.0 - 10.5 K/uL 2.4  1.9  2.3   Hemoglobin 12.0 - 15.0 g/dL 89.1  87.8  88.5   Hematocrit 36.0 - 46.0 % 32.0  35.9  34.2   Platelets 150 - 400 K/uL 128  142  119         Latest Ref Rng & Units 03/10/2024    7:49 AM 11/27/2023    8:56 AM 08/24/2023    8:51 AM  CMP  Glucose 70 - 99 mg/dL 90  93  93   BUN 8 - 23 mg/dL 18  14  16    Creatinine 0.44 - 1.00 mg/dL 8.85  8.89  8.83   Sodium 135 - 145 mmol/L 143  142  142   Potassium 3.5 - 5.1 mmol/L 4.3  3.9  4.6   Chloride 98 - 111 mmol/L 112  108  110   CO2 22 - 32 mmol/L 30  30  29    Calcium 8.9 - 10.3 mg/dL 8.5  8.8  8.5   Total Protein 6.5 - 8.1 g/dL 5.9  6.6  5.9   Total Bilirubin 0.0 - 1.2 mg/dL 1.4  1.5  1.2   Alkaline Phos 38 - 126 U/L 46  57  54   AST 15 - 41 U/L 16  19  17    ALT 0 - 44 U/L 8  9  8        RADIOGRAPHIC STUDIES: I have personally reviewed the radiological images as listed and agreed with the findings in the report. No results found.    Orders Placed This Encounter  Procedures   CBC with Differential/Platelet    Standing Status:   Standing    Number of Occurrences:   50    Expiration Date:   06/09/2025   Comprehensive metabolic panel with GFR    Standing Status:   Standing    Number of Occurrences:   50    Expiration Date:   06/09/2025   All questions were answered. The patient knows  to call the clinic with any problems, questions or concerns. No barriers to learning was detected. The total time spent in  the appointment was 30 minutes, including review of chart and various tests results, discussions about plan of care and coordination of care plan     Onita Mattock, MD 06/09/2024

## 2024-06-09 NOTE — Assessment & Plan Note (Signed)
-  diagnosed in 08/2020. 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 Oncotype RS 12, low risk. S/p adjuvant radiation. -she began Tamoxifen  12/2020, tolerating well overall. Held since 08/18/22 for work up of leukopenia -she restarted tamoxifen  in 11/2022, plan for 5 years

## 2024-06-10 ENCOUNTER — Telehealth: Payer: Self-pay | Admitting: *Deleted

## 2024-06-10 NOTE — Telephone Encounter (Signed)
 Copied from CRM [id number]. Topic: Clinical - Request for Lab/Test Order >> Jun 10, 2024 11:52 AM Laymon HERO wrote: Reason for CRM: Patient has labs on 8/1 and wanting to know if she needs to get them done as she just had labs done on 7/24. Please reach out to patient

## 2024-06-10 NOTE — Telephone Encounter (Signed)
 Pt and Suzen pts daughter (DPR signed) were on speaker phone and were notified as instructed and pt  thought would be a good plan to cancel the lab appt and if pt needed additional labs she would get them at the 06/24/24 CPX appt with Dr Randeen. Pt said she would be fasting in case needed. Pt and kimberly appreciative of call and nothing further needed.

## 2024-06-10 NOTE — Telephone Encounter (Signed)
 They did some of the labs I need but probably not all of them If she would like- we can review recent labs at her visit and just do any additional labs she needs that day (if she would like to skip the lab appointment)

## 2024-06-16 ENCOUNTER — Ambulatory Visit: Payer: Medicare Other

## 2024-06-16 VITALS — Ht 66.0 in | Wt 133.0 lb

## 2024-06-16 DIAGNOSIS — Z0000 Encounter for general adult medical examination without abnormal findings: Secondary | ICD-10-CM | POA: Diagnosis not present

## 2024-06-16 NOTE — Patient Instructions (Signed)
 Ms. Jennifer Sellers , Thank you for taking time out of your busy schedule to complete your Annual Wellness Visit with me. I enjoyed our conversation and look forward to speaking with you again next year. I, as well as your care team,  appreciate your ongoing commitment to your health goals. Please review the following plan we discussed and let me know if I can assist you in the future. Your Game plan/ To Do List     Follow up Visits: Next Medicare AWV with our clinical staff: 06/19/25 @ 8:10am televisit   Have you seen your provider in the last 6 months (3 months if uncontrolled diabetes)? Yes Next Office Visit with your provider: 06/24/24 Physical 9am  Clinician Recommendations:  Aim for 30 minutes of exercise or brisk walking, 6-8 glasses of water, and 5 servings of fruits and vegetables each day.       This is a list of the screening recommended for you and due dates:  Health Maintenance  Topic Date Due   COVID-19 Vaccine (4 - 2024-25 season) 07/19/2023   Zoster (Shingles) Vaccine (1 of 2) 06/10/2031*   Flu Shot  06/17/2024   Mammogram  07/28/2024   Medicare Annual Wellness Visit  06/16/2025   DTaP/Tdap/Td vaccine (3 - Tdap) 02/03/2028   DEXA scan (bone density measurement)  Completed   Hepatitis B Vaccine  Aged Out   HPV Vaccine  Aged Out   Meningitis B Vaccine  Aged Out   Pneumococcal Vaccine for age over 38  Discontinued   Colon Cancer Screening  Discontinued   Hepatitis C Screening  Discontinued  *Topic was postponed. The date shown is not the original due date.    Advanced directives: (Copy Requested) Please bring a copy of your health care power of attorney and living will to the office to be added to your chart at your convenience. You can mail to [facility] [address] [place], [place] [address] or email to [email or web address] Advance Care Planning is important because it:  [x]  Makes sure you receive the medical care that is consistent with your values,  goals, and preferences  [x]  It provides guidance to your family and loved ones and reduces their decisional burden about whether or not they are making the right decisions based on your wishes.  Follow the link provided in your after visit summary or read over the paperwork we have mailed to you to help you started getting your Advance Directives in place. If you need assistance in completing these, please reach out to us  so that we can help you!

## 2024-06-16 NOTE — Progress Notes (Cosign Needed Addendum)
 Please attest and cosign this visit due to patients primary care provider not being in the office at the time the visit was completed.    Subjective:   Jennifer Sellers is a 78 y.o. who presents for a Medicare Wellness preventive visit.  As a reminder, Annual Wellness Visits don't include a physical exam, and some assessments may be limited, especially if this visit is performed virtually. We may recommend an in-person follow-up visit with your provider if needed.  Visit Complete: Virtual I connected with  Jennifer Sellers on 06/16/24 by a audio enabled telemedicine application and verified that I am speaking with the correct person using two identifiers.  Patient Location: Home  Provider Location: Office/Clinic  I discussed the limitations of evaluation and management by telemedicine. The patient expressed understanding and agreed to proceed.  Vital Signs: Because this visit was a virtual/telehealth visit, some criteria may be missing or patient reported. Any vitals not documented were not able to be obtained and vitals that have been documented are patient reported.  VideoDeclined- This patient declined Librarian, academic. Therefore the visit was completed with audio only.  Persons Participating in Visit: Patient.  AWV Questionnaire: No: Patient Medicare AWV questionnaire was not completed prior to this visit.  Cardiac Risk Factors include: advanced age (>47men, >65 women);sedentary lifestyle     Objective:    Today's Vitals   06/16/24 0813  Weight: 133 lb (60.3 kg)  Height: 5' 6 (1.676 m)  PainSc: 0-No pain   Body mass index is 21.47 kg/m.     06/16/2024    8:24 AM 06/15/2023    9:55 AM 09/25/2022    9:09 AM 06/11/2022   10:35 AM 05/28/2021    1:41 PM 10/31/2020    9:32 AM 10/05/2020    7:15 AM  Advanced Directives  Does Patient Have a Medical Advance Directive? Yes No No No Yes Yes Yes  Type of Estate agent of  [place];Living will    Healthcare Power of [place];Living will Healthcare Power of [place];Living will   Does patient want to make changes to medical advance directive?       No - Patient declined  Copy of Healthcare Power of Attorney in Chart? No - copy requested    No - copy requested    Would patient like information on creating a medical advance directive?  No - Patient declined No - Patient declined No - Patient declined   No - Patient declined    Current Medications (verified) Outpatient Encounter Medications as of 06/16/2024  Medication Sig   folic acid  (FOLVITE ) 1 MG tablet TAKE 1 TABLET BY MOUTH EVERY DAY   levothyroxine  (SYNTHROID ) 75 MCG tablet TAKE 1 TABLET BY MOUTH EVERY DAY BEFORE BREAKFAST   methotrexate  (RHEUMATREX) 2.5 MG tablet Take 1 tablet (2.5 mg total) by mouth once a week. Caution:Chemotherapy. Protect from light.   Multiple Vitamins-Minerals (PRESERVISION AREDS 2 PO) Take 2 capsules by mouth daily.    NON FORMULARY 2 drops daily. sustane   Prenatal Multivit w/ Iron-FA (NEONATAL FE PO) Take by mouth.   tamoxifen  (NOLVADEX ) 20 MG tablet TAKE 1 TABLET BY MOUTH EVERY DAY   TREXALL  15 MG tablet TAKE 1 PILL (15 MG) WITH 2.5 MG TAB ( METHOTREXATE ) FOR TOTAL DOSE OF 17.5MG , ONCE A WEEK ON SUNDAY   triamcinolone  cream (KENALOG ) 0.5 % Apply 1 Application topically 2 (two) times daily.   No facility-administered encounter medications on file as of 06/16/2024.  Allergies (verified) Misc. sulfonamide containing compounds, Cipro [ciprofloxacin hcl], Cortisporin [bacitra-neomycin -polymyxin-hc], Prevnar [pneumococcal 13-val conj vacc], Sulfa antibiotics, and Sulfonamide derivatives   History: Past Medical History:  Diagnosis Date   Arthritis    rheumatoid arthritis   Hypothyroidism    Leukopenia    Thyroid  disease    TIA (transient ischemic attack) 2011   Past Surgical History:  Procedure Laterality Date   ABDOMINAL HYSTERECTOMY     BREAST LUMPECTOMY WITH RADIOACTIVE  SEED AND SENTINEL LYMPH NODE BIOPSY Left 10/05/2020   Procedure: LEFT BREAST LUMPECTOMY WITH RADIOACTIVE SEED AND SENTINEL LYMPH NODE BIOPSY;  Surgeon: [name], MD;  Location: [facility];  Service: General;  Laterality: Left;  GENERAL AND PECTORAL BLOCK   CARPAL TUNNEL RELEASE     COLONOSCOPY     COLONOSCOPY WITH PROPOFOL  N/A 06/30/2018   Procedure: COLONOSCOPY WITH PROPOFOL ;  Surgeon: Viktoria Lamar DASEN, MD;  Location: [facility] END[facility]COPY;  Service: Endoscopy;  Laterality: N/A;   colpo-ureteroscopy     lens surgery     Family History  Problem Relation Age of Onset   Cancer Mother        lymphoma   Cancer Brother        lymphoma   Cancer Daughter        NHL   Social History   Socioeconomic History   Marital status: Widowed    Spouse name: Not on file   Number of children: 2   Years of education: Not on file   Highest education level: Not on file  Occupational History   Not on file  Tobacco Use   Smoking status: Never   Smokeless tobacco: Never  Vaping Use   Vaping status: Never Used  Substance and Sexual Activity   Alcohol use: Yes    Alcohol/week: 0.0 standard drinks of alcohol    Comment: occasional   Drug use: No   Sexual activity: Yes  Other Topics Concern   Not on file  Social History Narrative   Not on file   Social Drivers of Health   Financial Resource Strain: Low Risk  (06/16/2024)   Overall Financial Resource Strain (CARDIA)    Difficulty of Paying Living Expenses: Not hard at all  Food Insecurity: No Food Insecurity (06/16/2024)   Hunger Vital Sign    Worried About Running Out of Food in the Last Year: Never true    Ran Out of Food in the Last Year: Never true  Transportation Needs: No Transportation Needs (06/16/2024)   PRAPARE - Administrator, Civil Service (Medical): No    Lack of Transportation (Non-Medical): No  Physical Activity: Inactive (06/16/2024)   Exercise Vital Sign    Days of Exercise per Week: 0 days     Minutes of Exercise per Session: 0 min  Stress: No Stress Concern Present (06/16/2024)   [employer] of Occupational Health - Occupational Stress Questionnaire    Feeling of Stress: Not at all  Social Connections: Moderately Integrated (06/16/2024)   Social Connection and Isolation Panel    Frequency of Communication with Friends and Family: More than three times a week    Frequency of Social Gatherings with Friends and Family: More than three times a week    Attends Religious Services: More than 4 times per year    Active Member of [employer] or Organizations: Yes    Attends Banker Meetings: More than 4 times per year    Marital Status: Widowed    Tobacco  Counseling Counseling given: Not Answered   Clinical Intake:  Pre-visit preparation completed: Yes  Pain : No/denies pain Pain Score: 0-No pain    BMI - recorded: 21.47 Nutritional Status: BMI of 19-24  Normal Nutritional Risks: None Diabetes: No  No results found for: HGBA1C   How often do you need to have someone help you when you read instructions, pamphlets, or other written materials from your doctor or pharmacy?: 1 - Never  Interpreter Needed?: No  Comments: lives alone Information entered by :: B.Daniell Paradise,LPN   Activities of Daily Living     06/16/2024    8:27 AM  In your present state of health, do you have any difficulty performing the following activities:  Hearing? 0  Vision? 0  Difficulty concentrating or making decisions? 0  Walking or climbing stairs? 0  Dressing or bathing? 0  Doing errands, shopping? 0  Preparing Food and eating ? N  Using the Toilet? N  In the past six months, have you accidently leaked urine? N  Do you have problems with loss of bowel control? N  Managing your Medications? N  Managing your Finances? N  Housekeeping or managing your Housekeeping? N    Patient Care Team: Tower, Laine LABOR, MD as PCP - General Lad, Donivan Barry, MD as Referring Physician  (Ophthalmology) [name], MD as Referring Physician (Ophthalmology) [name], DC as Consulting Physician (Chiropractic Medicine) [name], RN (Inactive) as Oncology Nurse Navigator [name], RN as Oncology Nurse Navigator [name], MD as Consulting Physician (General Surgery) [name], MD as Consulting Physician (Hematology) [name], MD as Consulting Physician (Radiation Oncology) Burton, [name], NP as Nurse Practitioner (Nurse Practitioner) Lamona, Debby Raddle., MD as Consulting Physician (Hematology and Oncology)  I have updated your Care Teams any recent Medical Services you may have received from other providers in the past year.     Assessment:   This is a routine wellness examination for [place].  Hearing/Vision screen Hearing Screening - Comments:: Pt says her hearing is pretty good Vision Screening - Comments:: Pt says her vision is good w/glasses [facility]   Goals Addressed             This Visit's Progress    DIET - EAT MORE FRUITS AND VEGETABLES   On track    06/16/24     Increase water intake       06/16/24-, I will continue to drink 5-8 glasses of water daily.      Patient Stated   On track    06/16/24-, I will maintain and continue medications as prescribed.      Patient Stated   On track    06/16/24-Stay active       Depression Screen     06/16/2024    8:20 AM 06/09/2024    8:50 AM 03/22/2024   12:17 PM 06/15/2023    9:53 AM 02/18/2023   12:59 PM 06/11/2022   10:33 AM 05/28/2021    1:42 PM  PHQ 2/9 Scores  PHQ - 2 Score 0 0 0 0 1 0 0  PHQ- 9 Score   1  4 0 0    Fall Risk     06/16/2024    8:15 AM 03/22/2024   12:16 PM 06/15/2023    9:56 AM 02/18/2023   12:59 PM 06/13/2022    9:01 AM  Fall Risk   Falls in the past year? 0 0 0 0 0  Number falls in past  yr: 0 0 0 0   Injury with Fall? 0 0 0 0   Risk for fall due to : No Fall Risks No Fall Risks No Fall Risks No Fall Risks   Follow up Education provided;Falls  prevention discussed Falls evaluation completed Falls prevention discussed;Falls evaluation completed Falls evaluation completed Falls evaluation completed      Data saved with a previous flowsheet row definition    MEDICARE RISK AT HOME:  Medicare Risk at Home Any stairs in or around the home?: Yes If so, are there any without handrails?: Yes Home free of loose throw rugs in walkways, pet beds, electrical cords, etc?: Yes Adequate lighting in your home to reduce risk of falls?: Yes Life alert?: No Use of a cane, walker or w/c?: No Grab bars in the bathroom?: Yes Shower chair or bench in shower?: Yes Elevated toilet seat or a handicapped toilet?: Yes  TIMED UP AND GO:  Was the test performed?  No  Cognitive Function: 6CIT completed    05/28/2021    1:43 PM 05/09/2019    9:54 AM 01/28/2018   10:31 AM 04/22/2017    9:59 AM 05/15/2016    8:52 AM  MMSE - Mini Mental State Exam  Orientation to time 5 5 5 5  5    Orientation to Place 5 5 5 5  5    Registration 3 3 3 3  3    Attention/ Calculation 5 0 0 0  0   Recall 3 3 3 3  3    Language- name 2 objects  0 0 0  0   Language- repeat 1 1 1 1 1   Language- follow 3 step command  0 3 3  3    Language- read & follow direction  0 0 0  0   Write a sentence  0 0 0  0   Copy design  0 0 0  0   Total score  17 20 20  20       Data saved with a previous flowsheet row definition        06/16/2024    8:28 AM 06/15/2023    9:58 AM 06/11/2022   10:38 AM  6CIT Screen  What Year? 0 points 0 points 0 points  What month? 0 points 0 points 0 points  What time? 0 points 0 points 0 points  Count back from 20 0 points 0 points 0 points  Months in reverse 0 points 0 points 0 points  Repeat phrase 6 points 2 points 0 points  Total Score 6 points 2 points 0 points    Immunizations Immunization History  Administered Date(s) Administered   PFIZER(Purple Top)SARS-COV-2 Vaccination 12/07/2019, 12/28/2019, 10/31/2020   Pneumococcal Conjugate-13  05/15/2015   Td 04/06/2007, 02/02/2018    Screening Tests Health Maintenance  Topic Date Due   COVID-19 Vaccine (4 - 2024-25 season) 07/19/2023   Zoster Vaccines- Shingrix (1 of 2) 06/10/2031 (Originally 02/24/1965)   INFLUENZA VACCINE  06/17/2024   MAMMOGRAM  07/28/2024   Medicare Annual Wellness (AWV)  06/16/2025   DTaP/Tdap/Td (3 - Tdap) 02/03/2028   DEXA SCAN  Completed   Hepatitis B Vaccines  Aged Out   HPV VACCINES  Aged Out   Meningococcal B Vaccine  Aged Out   Pneumococcal Vaccine: 50+ Years  Discontinued   Colonoscopy  Discontinued   Hepatitis C Screening  Discontinued    Health Maintenance  Health Maintenance Due  Topic Date Due   COVID-19 Vaccine (4 - 2024-25 season) 07/19/2023  Health Maintenance Items Addressed: None at this time  Additional Screening:  Vision Screening: Recommended annual ophthalmology exams for early detection of glaucoma and other disorders of the eye. Would you like a referral to an eye doctor? No    Dental Screening: Recommended annual dental exams for proper oral hygiene  Community Resource Referral / Chronic Care Management: CRR required this visit?  No   CCM required this visit?  No   Plan:    I have personally reviewed and noted the following in the patient's chart:   Medical and social history Use of alcohol, tobacco or illicit drugs  Current medications and supplements including opioid prescriptions. Patient is not currently taking opioid prescriptions. Functional ability and status Nutritional status Physical activity Advanced directives List of other physicians Hospitalizations, surgeries, and ER visits in previous 12 months Vitals Screenings to include cognitive, depression, and falls Referrals and appointments  In addition, I have reviewed and discussed with patient certain preventive protocols, quality metrics, and best practice recommendations. A written personalized care plan for preventive services as well  as general preventive health recommendations were provided to patient.   Erminio LITTIE Saris, LPN   [id number]   After Visit Summary: (MyChart) Due to this being a telephonic visit, the after visit summary with patients personalized plan was offered to patient via MyChart   Notes: Nothing significant to report at this time.    Note to PCP in routing as such: Hi!AWV done this am. Pt relays during visit concerns about her memory and would like to know is there anything she can/do to improve her memory. Thank you! Erminio

## 2024-06-17 ENCOUNTER — Other Ambulatory Visit: Payer: Medicare Other

## 2024-06-24 ENCOUNTER — Encounter: Payer: Self-pay | Admitting: Family Medicine

## 2024-06-24 ENCOUNTER — Ambulatory Visit: Payer: Medicare Other | Admitting: Family Medicine

## 2024-06-24 VITALS — BP 106/70 | HR 63 | Temp 98.0°F | Ht 66.0 in | Wt 131.4 lb

## 2024-06-24 DIAGNOSIS — E039 Hypothyroidism, unspecified: Secondary | ICD-10-CM

## 2024-06-24 DIAGNOSIS — R5382 Chronic fatigue, unspecified: Secondary | ICD-10-CM

## 2024-06-24 DIAGNOSIS — Z0000 Encounter for general adult medical examination without abnormal findings: Secondary | ICD-10-CM

## 2024-06-24 DIAGNOSIS — E7849 Other hyperlipidemia: Secondary | ICD-10-CM | POA: Diagnosis not present

## 2024-06-24 DIAGNOSIS — D696 Thrombocytopenia, unspecified: Secondary | ICD-10-CM

## 2024-06-24 DIAGNOSIS — E559 Vitamin D deficiency, unspecified: Secondary | ICD-10-CM | POA: Diagnosis not present

## 2024-06-24 DIAGNOSIS — M069 Rheumatoid arthritis, unspecified: Secondary | ICD-10-CM

## 2024-06-24 DIAGNOSIS — C91Z0 Other lymphoid leukemia not having achieved remission: Secondary | ICD-10-CM

## 2024-06-24 DIAGNOSIS — M25472 Effusion, left ankle: Secondary | ICD-10-CM

## 2024-06-24 DIAGNOSIS — R4189 Other symptoms and signs involving cognitive functions and awareness: Secondary | ICD-10-CM

## 2024-06-24 DIAGNOSIS — Z1211 Encounter for screening for malignant neoplasm of colon: Secondary | ICD-10-CM

## 2024-06-24 LAB — LIPID PANEL
Cholesterol: 172 mg/dL (ref 0–200)
HDL: 56.1 mg/dL (ref >39.00)
LDL Cholesterol: 100 mg/dL — ABNORMAL HIGH (ref 0–99)
NonHDL: 115.62
Total CHOL/HDL Ratio: 3
Triglycerides: 76 mg/dL (ref 0.0–149.0)
VLDL: 15.2 mg/dL (ref 0.0–40.0)

## 2024-06-24 LAB — TSH: TSH: 1.25 u[IU]/mL (ref 0.35–5.50)

## 2024-06-24 LAB — VITAMIN D 25 HYDROXY (VIT D DEFICIENCY, FRACTURES): VITD: 32.75 ng/mL (ref 30.00–100.00)

## 2024-06-24 NOTE — Progress Notes (Signed)
 Subjective:    Patient ID: Jennifer Sellers, female    DOB: Sep 26, 1946, 78 y.o.   MRN: [id number]  HPI  Here for health maintenance exam and to review chronic medical problems   Wt Readings from Last 3 Encounters:  06/24/24 131 lb 6 oz (59.6 kg)  06/16/24 133 lb (60.3 kg)  06/09/24 133 lb 11.2 oz (60.6 kg)   21.20 kg/m  Vitals:   06/24/24 0858  BP: 106/70  Pulse: 63  Temp: 98 F (36.7 C)  SpO2: 98%    Immunization History  Administered Date(s) Administered   PFIZER(Purple Top)SARS-COV-2 Vaccination 12/07/2019, 12/28/2019, 10/31/2020   Pneumococcal Conjugate-13 05/15/2015   Td 04/06/2007, 02/02/2018    Health Maintenance Due  Topic Date Due   COVID-19 Vaccine (4 - 2024-25 season) 07/19/2023   INFLUENZA VACCINE  06/17/2024     Mammogram- 07/2023 [facility] -has that scheduled  Self breast - no lumps  Does have residual discomfort in left lateral breast - no swelling in arm  Personal history of breast cancer on tamoxifen  2022   Gyn health No problems    Colon cancer screening -cologuard neg 07/2023   Bone health  Dexa 07/2023 osteopenia  Falls-none Fractures-none  Supplements -vitamin D  with calcium  Last vitamin D  Lab Results  Component Value Date   VD25OH 32.75 06/24/2024    Exercise  Goes to tai chi classes  Working in the yard    Mood    06/16/2024    8:20 AM 06/09/2024    8:50 AM 03/22/2024   12:17 PM 06/15/2023    9:53 AM 02/18/2023   12:59 PM  Depression screen PHQ 2/9  Decreased Interest 0 0 0 0 0  Down, Depressed, Hopeless 0 0 0 0 1  PHQ - 2 Score 0 0 0 0 1  Altered sleeping   0  0  Tired, decreased energy   1  3  Change in appetite   0  0  Feeling bad or failure about yourself    0  0  Trouble concentrating   0  0  Moving slowly or fidgety/restless   0  0  Suicidal thoughts   0  0  PHQ-9 Score   1  4  Difficult doing work/chores     Somewhat difficult   Watching cognitive status  Takes folvite    Some stress May have to leave her  house  Quite a bit of socialization -family and friends / gets out   Did Mocha test 25/30  Does better with ques and props    Trouble planning ahead  Following instructions  Does her own finances   Tamoxifen  - ? 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  Also past history of breast cancer   Did have labs from them Lab Results  Component Value Date   NA 143 06/09/2024   K 4.3 06/09/2024   CO2 29 06/09/2024   GLUCOSE 92 06/09/2024   BUN 16 06/09/2024   CREATININE 1.10 (H) 06/09/2024   CALCIUM 8.3 (L) 06/09/2024   GFR 51.19 (L) 06/22/2023   GFRNONAA 51 (L) 06/09/2024   Total protein 6.0  Lab Results  Component Value Date   ALT 8 06/09/2024   AST 17 06/09/2024   ALKPHOS 44 06/09/2024   BILITOT 1.1 06/09/2024    Lab Results  Component Value Date   WBC 2.3 (L) 06/09/2024   HGB 11.1 (L) 06/09/2024   HCT 32.7 (L) 06/09/2024   MCV 100.3 (H) 06/09/2024   PLT 140 (L) 06/09/2024   Gets plenty of veggies  Some protein   Patient Active Problem List   Diagnosis Date Noted   Left ankle swelling 06/24/2024   Ankle swelling 03/22/2024   Rosacea 03/22/2024   Colon cancer screening 06/24/2023   Allergic dermatitis 05/28/2023   Vision changes 02/18/2023   Cognitive decline 02/18/2023   Grief 02/18/2023   Short-term memory loss 02/18/2023   Fatigue 02/18/2023   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   Malignant neoplasm of upper-outer quadrant of left breast in female, 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   Estrogen deficiency 05/24/2019   H/O adenomatous polyp of colon 03/11/2018   Vitamin D  deficiency 08/03/2015   Routine general medical examination at a health care facility 05/15/2015   Rheumatoid arthritis (HCC) 06/19/2014   Thrombocytopenia (HCC) 06/19/2014   Encounter for Medicare annual wellness exam 11/11/2013   Hyperbilirubinemia 08/19/2013   Hypothyroid 09/21/2012   GOUTY TOPHI 10/23/2010   OTHER SYMPTOMS REFERABLE JOINT SITE UNSPECIFIED 10/23/2010   CARPAL TUNNEL SYNDROME, HX OF 04/05/2009   Leukocytopenia 05/02/2008   INSOMNIA, TRANSIENT 04/27/2007   Osteopenia 04/27/2007   Hyperlipidemia 04/23/2007   Diaphragmatic hernia 04/23/2007   POSTMENOPAUSAL STATUS 04/23/2007   RHEUMATOID FACTOR, POSITIVE 04/23/2007   Past Medical History:  Diagnosis Date   Arthritis    rheumatoid arthritis   Hypothyroidism    Leukopenia    Thyroid  disease    TIA (transient ischemic attack) 2011   Past Surgical History:  Procedure Laterality Date   ABDOMINAL HYSTERECTOMY     BREAST LUMPECTOMY WITH RADIOACTIVE SEED AND SENTINEL LYMPH NODE BIOPSY Left 10/05/2020   Procedure: LEFT BREAST LUMPECTOMY WITH RADIOACTIVE SEED AND SENTINEL LYMPH NODE BIOPSY;  Surgeon: [name], MD;  Location: [facility];  Service: General;  Laterality: Left;  GENERAL AND PECTORAL BLOCK   CARPAL TUNNEL RELEASE     COLONOSCOPY     COLONOSCOPY WITH PROPOFOL  N/A 06/30/2018   Procedure: COLONOSCOPY WITH PROPOFOL ;  Surgeon: Viktoria Lamar DASEN, MD;  Location: [facility] ENDOSCOPY;  Service: Endoscopy;  Laterality: N/A;   colpo-ureteroscopy     lens surgery     Social History   Tobacco Use   Smoking status: Never   Smokeless tobacco: Never  Vaping Use   Vaping status: Never Used  Substance Use Topics   Alcohol use:  Yes    Alcohol/week: 0.0 standard drinks of alcohol    Comment: occasional   Drug use: No   Family History  Problem Relation Age of Onset   Cancer Mother        lymphoma   Cancer Brother        lymphoma   Cancer Daughter        NHL   Allergies  Allergen Reactions   Misc. Sulfonamide Containing Compounds Rash    REACTION: reaction  not known REACTION: reaction not known   Cipro [Ciprofloxacin Hcl] Other (See Comments)    Unknown    Cortisporin [Bacitra-Neomycin -Polymyxin-Hc]     Burning / swelling of eyes with eye drops   Prevnar [Pneumococcal 13-Val Conj Vacc] Other (See Comments)    Severe local reaction    Sulfa Antibiotics    Sulfonamide Derivatives     REACTION: reaction not known   Current Outpatient Medications on File Prior to Visit  Medication Sig Dispense Refill   folic acid  (FOLVITE ) 1 MG tablet TAKE 1 TABLET BY MOUTH EVERY DAY 90 tablet 1   levothyroxine  (SYNTHROID ) 75 MCG tablet TAKE 1 TABLET BY MOUTH EVERY DAY BEFORE BREAKFAST 90 tablet 0   methotrexate  (RHEUMATREX) 2.5 MG tablet Take 1 tablet (2.5 mg total) by mouth once a week. Caution:Chemotherapy. Protect from light. 12 tablet 1   Multiple Vitamins-Minerals (PRESERVISION AREDS 2 PO) Take 2 capsules by mouth daily.      NON FORMULARY 2 drops daily. sustane     Prenatal Multivit w/ Iron-FA (NEONATAL FE PO) Take by mouth.     tamoxifen  (NOLVADEX ) 20 MG tablet TAKE 1 TABLET BY MOUTH EVERY DAY 90 tablet 1   TREXALL  15 MG tablet TAKE 1 PILL (15 MG) WITH 2.5 MG TAB ( METHOTREXATE ) FOR TOTAL DOSE OF 17.5MG , ONCE A WEEK ON SUNDAY 4 tablet 3   triamcinolone  cream (KENALOG ) 0.5 % Apply 1 Application topically 2 (two) times daily. 15 g 0   No current facility-administered medications on file prior to visit.    Review of Systems  Constitutional:  Positive for fatigue. Negative for activity change, appetite change, fever and unexpected weight change.  HENT:  Negative for congestion, ear pain, rhinorrhea, sinus  pressure and sore throat.   Eyes:  Negative for pain, redness and visual disturbance.  Respiratory:  Negative for cough, shortness of breath and wheezing.   Cardiovascular:  Positive for leg swelling. Negative for chest pain and palpitations.  Gastrointestinal:  Negative for abdominal pain, blood in stool, constipation and diarrhea.  Endocrine: Negative for polydipsia and polyuria.  Genitourinary:  Negative for dysuria, frequency and urgency.  Musculoskeletal:  Negative for arthralgias, back pain and myalgias.  Skin:  Negative for pallor and rash.  Allergic/Immunologic: Negative for environmental allergies.  Neurological:  Negative for dizziness, syncope and headaches.  Hematological:  Negative for adenopathy. Does not bruise/bleed easily.  Psychiatric/Behavioral:  Positive for decreased concentration. Negative for dysphoric mood. The patient is not nervous/anxious.        Brain fog  Some memory changes        Objective:   Physical Exam Constitutional:      General: She is not in acute distress.    Appearance: Normal appearance. She is well-developed and normal weight. She is not ill-appearing or diaphoretic.  HENT:     Head: Normocephalic and atraumatic.     Right Ear: Tympanic membrane, ear canal and external ear normal.     Left Ear: Tympanic membrane, ear canal and external ear normal.     Nose: Nose normal. No congestion.     Mouth/Throat:     Mouth: Mucous membranes are moist.     Pharynx: Oropharynx is clear. No posterior oropharyngeal erythema.  Eyes:     General: No scleral icterus.    Extraocular Movements: Extraocular movements intact.     Conjunctiva/sclera: Conjunctivae normal.     Pupils: Pupils  are equal, round, and reactive to light.  Neck:     Thyroid : No thyromegaly.     Vascular: No carotid bruit or JVD.  Cardiovascular:     Rate and Rhythm: Normal rate and regular rhythm.     Pulses: Normal pulses.     Heart sounds: Normal heart sounds.     No gallop.   Pulmonary:     Effort: Pulmonary effort is normal. No respiratory distress.     Breath sounds: Normal breath sounds. No wheezing.     Comments: Good air exch Chest:     Chest wall: No tenderness.  Abdominal:     General: Bowel sounds are normal. There is no distension or abdominal bruit.     Palpations: Abdomen is soft. There is no mass.     Tenderness: There is no abdominal tenderness.     Hernia: No hernia is present.  Genitourinary:    Comments: Breast exam: No mass, nodules, thickening, tenderness, bulging, retraction, inflamation, nipple discharge or skin changes noted.  No axillary or clavicular LA.   Baseline changes in left breast from lumpectomy    Musculoskeletal:        General: No tenderness. Normal range of motion.     Cervical back: Normal range of motion and neck supple. No rigidity. No muscular tenderness.     Right lower leg: No edema.     Left lower leg: No edema.     Comments: No kyphosis   Lymphadenopathy:     Cervical: No cervical adenopathy.  Skin:    General: Skin is warm and dry.     Coloration: Skin is not pale.     Findings: No erythema or rash.  Neurological:     Mental Status: She is alert. Mental status is at baseline.     Cranial Nerves: No cranial nerve deficit.     Motor: No abnormal muscle tone.     Coordination: Coordination normal.     Gait: Gait normal.     Deep Tendon Reflexes: Reflexes are normal and symmetric.  Psychiatric:        Attention and Perception: Attention normal.     Comments: Seems fatigued and mildly anxious           Assessment & Plan:   Problem List Items Addressed This Visit       Endocrine   Hypothyroid   TSH ordered  Continues levothyroxine  75 mcg daily   More brain fog lately       Relevant Orders   TSH (Completed)     Musculoskeletal and Integument   Rheumatoid arthritis (HCC)   On methotrexate  now for this and LGLL        Hematopoietic and Hemostatic   Thrombocytopenia (HCC)   Continues  heme/onc care 140 last check        Other   Vitamin D  deficiency   Last vitamin D  Lab Results  Component Value Date   VD25OH 32.75 06/24/2024   Encouraged to take at least 2000 international units D3 daily over the counter  Important for bone and overall health      Relevant Orders   VITAMIN D  25 Hydroxy (Vit-D Deficiency, Fractures) (Completed)   Routine general medical examination at a health care facility - Primary   Reviewed health habits including diet and exercise and skin cancer prevention Reviewed appropriate screening tests for age  Also reviewed health mt list, fam hx and immunization status , as well as social and family history  See HPI Labs reviewed and ordered Health Maintenance  Topic Date Due   COVID-19 Vaccine (4 - 2024-25 season) 07/19/2023   Flu Shot  06/17/2024   Zoster (Shingles) Vaccine (1 of 2) 06/10/2031*   Mammogram  07/28/2024   Medicare Annual Wellness Visit  06/16/2025   DTaP/Tdap/Td vaccine (3 - Tdap) 02/03/2028   DEXA scan (bone density measurement)  Completed   Hepatitis B Vaccine  Aged Out   HPV Vaccine  Aged Out   Meningitis B Vaccine  Aged Out   Pneumococcal Vaccine for age over 4  Discontinued   Colon Cancer Screening  Discontinued   Hepatitis C Screening  Discontinued  *Topic was postponed. The date shown is not the original due date.    Mammogram is scheduled next month  Continues tamoxifen  for breast cancer Cologuard neg 07/2023  Encouraged more strength building exercise  Discussed fall prevention, supplements and exercise for bone density  h PHQ 0 Plans to follow up for cognition issues and left ankle swelling      Left ankle swelling   Will follow up to discuss this  Not new  Pt is worried about it  No pain       Large granular lymphocytic leukemia (HCC)   Sees hematology here and at [facility] Last wbc 2.3 RA may play a role Reviewed last notes Taking methotrexate  17.5 mg weekly       Hyperlipidemia   Disc goals for  lipids and reasons to control them Rev last labs with pt Rev low sat fat diet in detail Labs today  Diet is fairly good       Relevant Orders   Lipid panel (Completed)   Fatigue   Labs reviewed Suspect multi factorial from chronic medical problems  Denies depression Some brain fog   TSH and D levels today Follow up planned  Reassuring exam         Colon cancer screening   Cologuard neg 07/2023       Cognitive decline   Follow up planned  Likely multi factorial  Chronic medical problems  Age related short term memory changes  On tamoxifen  On methotrexate   TSH today

## 2024-06-24 NOTE — Patient Instructions (Addendum)
 Stay active  Add some strength training to your routine, this is important for bone and brain health and can reduce your risk of falls and help your body use insulin properly and regulate weight  Light weights, exercise bands , and internet videos are a good way to start  Yoga (chair or regular), machines , floor exercises or a gym with machines are also good options   The following are examples of protein in diet  Meat (lean)  Fish  Eggs  Dairy products  Soy products  Oat milk  Legumes  Almond milk Nuts and nut butters  Dried beans  Protein drinks are fine as well   Labs today   Schedule follow up for memory /cognition and left ankle swelling

## 2024-06-26 ENCOUNTER — Ambulatory Visit: Payer: Self-pay | Admitting: Family Medicine

## 2024-06-26 NOTE — Assessment & Plan Note (Signed)
 Will follow up to discuss this  Not new  Pt is worried about it  No pain

## 2024-06-26 NOTE — Assessment & Plan Note (Signed)
 Sees hematology here and at [facility] Last wbc 2.3 RA may play a role Reviewed last notes Taking methotrexate  17.5 mg weekly

## 2024-06-26 NOTE — Assessment & Plan Note (Signed)
Disc goals for lipids and reasons to control them Rev last labs with pt Rev low sat fat diet in detail Labs today  Diet is fairly good

## 2024-06-26 NOTE — Assessment & Plan Note (Signed)
 Follow up planned  Likely multi factorial  Chronic medical problems  Age related short term memory changes  On tamoxifen  On methotrexate   TSH today

## 2024-06-26 NOTE — Assessment & Plan Note (Signed)
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

## 2024-06-26 NOTE — Assessment & Plan Note (Signed)
 TSH ordered  Continues levothyroxine  75 mcg daily   More brain fog lately

## 2024-06-26 NOTE — Assessment & Plan Note (Signed)
 Labs reviewed Suspect multi factorial from chronic medical problems  Denies depression Some brain fog   TSH and D levels today Follow up planned  Reassuring exam

## 2024-06-26 NOTE — Assessment & Plan Note (Signed)
 Continues heme/onc care 140 last check

## 2024-06-26 NOTE — Assessment & Plan Note (Signed)
 Reviewed health habits including diet and exercise and skin cancer prevention Reviewed appropriate screening tests for age  Also reviewed health mt list, fam hx and immunization status , as well as social and family history   See HPI Labs reviewed and ordered Health Maintenance  Topic Date Due   COVID-19 Vaccine (4 - 2024-25 season) 07/19/2023   Flu Shot  06/17/2024   Zoster (Shingles) Vaccine (1 of 2) 06/10/2031*   Mammogram  07/28/2024   Medicare Annual Wellness Visit  06/16/2025   DTaP/Tdap/Td vaccine (3 - Tdap) 02/03/2028   DEXA scan (bone density measurement)  Completed   Hepatitis B Vaccine  Aged Out   HPV Vaccine  Aged Out   Meningitis B Vaccine  Aged Out   Pneumococcal Vaccine for age over 21  Discontinued   Colon Cancer Screening  Discontinued   Hepatitis C Screening  Discontinued  *Topic was postponed. The date shown is not the original due date.    Mammogram is scheduled next month  Continues tamoxifen  for breast cancer Cologuard neg 07/2023  Encouraged more strength building exercise  Discussed fall prevention, supplements and exercise for bone density  h PHQ 0 Plans to follow up for cognition issues and left ankle swelling

## 2024-06-26 NOTE — Assessment & Plan Note (Signed)
 Baseline More on left  Planning follow up for this  Call back and Er precautions noted in detail today

## 2024-06-26 NOTE — Assessment & Plan Note (Signed)
 Last vitamin D  Lab Results  Component Value Date   VD25OH 32.75 06/24/2024   Encouraged to take at least 2000 international units D3 daily over the counter  Important for bone and overall health

## 2024-06-26 NOTE — Assessment & Plan Note (Signed)
 Cologuard neg 07/2023

## 2024-07-05 ENCOUNTER — Encounter: Payer: Self-pay | Admitting: Family Medicine

## 2024-07-05 ENCOUNTER — Ambulatory Visit (INDEPENDENT_AMBULATORY_CARE_PROVIDER_SITE_OTHER): Admitting: Family Medicine

## 2024-07-05 VITALS — BP 106/64 | HR 70 | Temp 98.6°F | Ht 66.0 in | Wt 135.1 lb

## 2024-07-05 DIAGNOSIS — Z789 Other specified health status: Secondary | ICD-10-CM | POA: Diagnosis not present

## 2024-07-05 DIAGNOSIS — M25472 Effusion, left ankle: Secondary | ICD-10-CM

## 2024-07-05 DIAGNOSIS — R4189 Other symptoms and signs involving cognitive functions and awareness: Secondary | ICD-10-CM | POA: Diagnosis not present

## 2024-07-05 NOTE — Progress Notes (Signed)
 Subjective:    Patient ID: Jennifer Sellers, female    DOB: January 05, 1946, 78 y.o.   MRN: [id number]  HPI  Wt Readings from Last 3 Encounters:  07/05/24 135 lb 2 oz (61.3 kg)  06/24/24 131 lb 6 oz (59.6 kg)  06/16/24 133 lb (60.3 kg)   21.81 kg/m  Vitals:   07/05/24 0807  BP: 106/64  Pulse: 70  Temp: 98.6 F (37 C)  SpO2: 99%    Pt presents for c/o  Ankle swelling worse on left  Questions about supplements (has bag of vitamins that her chiropractor told her to take)     MMSE scored 30 out of 30 in 02/2023  Pt thinks she may be a bit better   Chiropractor gave her some supplements  She treats lymphedema in left arm  She adjusts her spine which helps arthritis pain  This has been really helpful   Omega 3 supplement-fish oil  Magnesium glycinate  Herbal liquit supplement hepatica Intestinal restore (aloe, fennel seed, licorice  Allergy calm (herbal) Iodine for thyroid  health Antronex for liver health Brain supplement with folic acid  , and has multi vitamin , chromium and iron   topical (progestrone and estradiol Probiotic   Has preservision Pnv    Has had intermittent ankle swelling worse on left  Had visit in may and it had resolved at that time Has history of venous insufficiency and used supp hose  Normal LE venous US  in 09/2021  Noted at that time that tamoxifen  may be adding to it   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   Takes tamoxifen  for prior breast cancer    Supplements Ca and D Pnv Preservision  Folic acid     Lab Results  Component Value Date   NA 143 06/09/2024   K 4.3 06/09/2024   CO2 29 06/09/2024   GLUCOSE 92 06/09/2024   BUN 16 06/09/2024   CREATININE 1.10 (H) 06/09/2024   CALCIUM 8.3 (L) 06/09/2024   GFR 51.19 (L) 06/22/2023   GFRNONAA 51 (L) 06/09/2024   Lab Results  Component Value Date   ALT 8 06/09/2024   AST 17 06/09/2024   ALKPHOS 44 06/09/2024   BILITOT 1.1 06/09/2024   Lab Results   Component Value Date   WBC 2.3 (L) 06/09/2024   HGB 11.1 (L) 06/09/2024   HCT 32.7 (L) 06/09/2024   MCV 100.3 (H) 06/09/2024   PLT 140 (L) 06/09/2024   Lab Results  Component Value Date   TSH 1.25 06/24/2024  Levothyroxine  75 mcg daily    Lab Results  Component Value Date   ESRSEDRATE 11 05/09/2019     Patient Active Problem List   Diagnosis Date Noted   Takes dietary supplements 07/05/2024   Left ankle swelling 06/24/2024   Ankle swelling 03/22/2024   Rosacea 03/22/2024   Colon cancer screening 06/24/2023   Allergic dermatitis 05/28/2023   Vision changes 02/18/2023   Cognitive decline 02/18/2023   Grief 02/18/2023   Short-term memory loss 02/18/2023   Fatigue 02/18/2023   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   Malignant neoplasm of upper-outer quadrant of left breast in female, 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   Estrogen deficiency 05/24/2019   H/O adenomatous polyp of colon 03/11/2018   Vitamin D  deficiency 08/03/2015   Routine general medical examination at a health care facility 05/15/2015   Rheumatoid arthritis (HCC) 06/19/2014   Thrombocytopenia (HCC) 06/19/2014   Encounter for Medicare annual  wellness exam 11/11/2013   Hyperbilirubinemia 08/19/2013   Hypothyroid 09/21/2012   GOUTY TOPHI 10/23/2010   OTHER SYMPTOMS REFERABLE JOINT SITE UNSPECIFIED 10/23/2010   CARPAL TUNNEL SYNDROME, HX OF 04/05/2009   Leukocytopenia 05/02/2008   INSOMNIA, TRANSIENT 04/27/2007   Osteopenia 04/27/2007   Hyperlipidemia 04/23/2007   Diaphragmatic hernia 04/23/2007   POSTMENOPAUSAL STATUS 04/23/2007   RHEUMATOID FACTOR, POSITIVE 04/23/2007   Past Medical History:  Diagnosis Date   Arthritis    rheumatoid arthritis   Hypothyroidism    Leukopenia    Thyroid  disease    TIA (transient ischemic attack) 2011   Past Surgical History:  Procedure Laterality Date   ABDOMINAL HYSTERECTOMY     BREAST  LUMPECTOMY WITH RADIOACTIVE SEED AND SENTINEL LYMPH NODE BIOPSY Left 10/05/2020   Procedure: LEFT BREAST LUMPECTOMY WITH RADIOACTIVE SEED AND SENTINEL LYMPH NODE BIOPSY;  Surgeon: [name], MD;  Location: [facility];  Service: General;  Laterality: Left;  GENERAL AND PECTORAL BLOCK   CARPAL TUNNEL RELEASE     COLONOSCOPY     COLONOSCOPY WITH PROPOFOL  N/A 06/30/2018   Procedure: COLONOSCOPY WITH PROPOFOL ;  Surgeon: Viktoria Lamar DASEN, MD;  Location: [facility] END[facility]COPY;  Service: Endoscopy;  Laterality: N/A;   colpo-ureteroscopy     lens surgery     Social History   Tobacco Use   Smoking status: Never   Smokeless tobacco: Never  Vaping Use   Vaping status: Never Used  Substance Use Topics   Alcohol use: Yes    Alcohol/week: 0.0 standard drinks of alcohol    Comment: occasional   Drug use: No   Family History  Problem Relation Age of Onset   Cancer Mother        lymphoma   Cancer Brother        lymphoma   Cancer Daughter        NHL   Allergies  Allergen Reactions   Misc. Sulfonamide Containing Compounds Rash    REACTION: reaction  not known REACTION: reaction not known   Cipro [Ciprofloxacin Hcl] Other (See Comments)    Unknown    Cortisporin [Bacitra-Neomycin -Polymyxin-Hc]     Burning / swelling of eyes with eye drops   Prevnar [Pneumococcal 13-Val Conj Vacc] Other (See Comments)    Severe local reaction    Sulfa Antibiotics    Sulfonamide Derivatives     REACTION: reaction not known   Current Outpatient Medications on File Prior to Visit  Medication Sig Dispense Refill   Calcium Carb-Cholecalciferol (CALCIUM+D3 PO) Take 1 capsule by mouth daily.     folic acid  (FOLVITE ) 1 MG tablet TAKE 1 TABLET BY MOUTH EVERY DAY 90 tablet 1   levothyroxine  (SYNTHROID ) 75 MCG tablet TAKE 1 TABLET BY MOUTH EVERY DAY BEFORE BREAKFAST 90 tablet 0   methotrexate  (RHEUMATREX) 2.5 MG tablet Take 1 tablet (2.5 mg total) by mouth once a week. Caution:Chemotherapy. Protect  from light. 12 tablet 1   Multiple Vitamins-Minerals (PRESERVISION AREDS 2 PO) Take 2 capsules by mouth daily.      Polyethyl Glycol-Propyl Glycol (SYSTANE FREE OP) Apply 1 drop to eye daily as needed.     Prenatal Multivit w/ Iron-FA (NEONATAL FE PO) Take by mouth.     tamoxifen  (NOLVADEX ) 20 MG tablet TAKE 1 TABLET BY MOUTH EVERY DAY 90 tablet 1   TREXALL  15 MG tablet TAKE 1 PILL (15 MG) WITH 2.5 MG TAB ( METHOTREXATE ) FOR TOTAL DOSE OF 17.5MG , ONCE A WEEK ON SUNDAY 4 tablet 3  triamcinolone  cream (KENALOG ) 0.5 % Apply 1 Application topically 2 (two) times daily. 15 g 0   No current facility-administered medications on file prior to visit.    Review of Systems  Constitutional:  Negative for activity change, appetite change, fatigue, fever and unexpected weight change.  HENT:  Negative for congestion, ear pain, rhinorrhea, sinus pressure and sore throat.   Eyes:  Negative for pain, redness and visual disturbance.  Respiratory:  Negative for cough, shortness of breath and wheezing.   Cardiovascular:  Positive for leg swelling. Negative for chest pain and palpitations.  Gastrointestinal:  Negative for abdominal pain, blood in stool, constipation and diarrhea.  Endocrine: Negative for polydipsia and polyuria.  Genitourinary:  Negative for dysuria, frequency and urgency.  Musculoskeletal:  Negative for arthralgias, back pain and myalgias.  Skin:  Negative for pallor and rash.  Allergic/Immunologic: Negative for environmental allergies.  Neurological:  Negative for dizziness, syncope and headaches.  Hematological:  Negative for adenopathy. Does not bruise/bleed easily.  Psychiatric/Behavioral:  Positive for decreased concentration and sleep disturbance. Negative for dysphoric mood. The patient is not nervous/anxious.        Some word recall issues       Objective:   Physical Exam Constitutional:      General: She is not in acute distress.    Appearance: Normal appearance. She is  well-developed and normal weight. She is not ill-appearing or diaphoretic.  HENT:     Head: Normocephalic and atraumatic.  Eyes:     Conjunctiva/sclera: Conjunctivae normal.     Pupils: Pupils are equal, round, and reactive to light.  Neck:     Thyroid : No thyromegaly.     Vascular: No carotid bruit or JVD.  Cardiovascular:     Rate and Rhythm: Normal rate and regular rhythm.     Heart sounds: Normal heart sounds.     No gallop.  Pulmonary:     Effort: Pulmonary effort is normal. No respiratory distress.     Breath sounds: Normal breath sounds. No stridor. No wheezing, rhonchi or rales.     Comments: No crackles  Abdominal:     General: There is no distension or abdominal bruit.     Palpations: Abdomen is soft.  Musculoskeletal:     Cervical back: Normal range of motion and neck supple.     Right lower leg: No edema.     Left lower leg: No edema.     Comments: Trace to 1 plus non pitting edema in ankles  Worse in left baseline  Some varicosities in feet and ankles  No tenderness   Lymphadenopathy:     Cervical: No cervical adenopathy.  Skin:    General: Skin is warm and dry.     Coloration: Skin is not pale.     Findings: No rash.  Neurological:     Mental Status: She is alert.     Coordination: Coordination normal.     Deep Tendon Reflexes: Reflexes are normal and symmetric. Reflexes normal.  Psychiatric:        Mood and Affect: Mood is anxious.        Behavior: Behavior normal.           Assessment & Plan:   Problem List Items Addressed This Visit       Other   Takes dietary supplements   Do not recommend pt start the large amt of supplements given to her by her chiropractor (pt unaware what they are for) Recommend she continue  Folvite  Ca and D  Preservision  Prenatal vitamin   I personally spent a total of 30 minutes in the care of the patient today including preparing to see the patient, getting/reviewing separately obtained history, performing a  medically appropriate exam/evaluation, counseling and educating, documenting clinical information in the EHR, coordinating care, and reviewing large number of medications (supplements) from other provider that patient brought with her today.       Cognitive decline   Schedule follow up for this  Ran out of time today  Per pt -likely stable       Ankle swelling - Primary   Chronic  Left worse than right  Doppler reviewed 2022 Suspect multifactorial  Venous insuff -continue your support garments and elevate legs when able  Tamoxifen  and methotrexate  may also add to some fluid retention  Avoid high sodium foods when able

## 2024-07-05 NOTE — Assessment & Plan Note (Signed)
 Schedule follow up for this  Ran out of time today  Per pt -likely stable

## 2024-07-05 NOTE — Assessment & Plan Note (Addendum)
 Do not recommend pt start the large amt of supplements given to her by her chiropractor (pt unaware what they are for) Recommend she continue  Folvite  Ca and D  Preservision  Prenatal vitamin   I personally spent a total of 30 minutes in the care of the patient today including preparing to see the patient, getting/reviewing separately obtained history, performing a medically appropriate exam/evaluation, counseling and educating, documenting clinical information in the EHR, coordinating care, and reviewing large number of medications (supplements) from other provider that patient brought with her today.

## 2024-07-05 NOTE — Patient Instructions (Addendum)
 I think your ankle swelling is from  Varicose veins (venous insufficiency)  Also some of your medicines may add to it   methotrexate  and the tamoxifen  I recommend continued use of support garments Elevate feet when sitting Avoid excess sodium   Please continue (green bag)  Calcium plus D Preservision  Prenatal vitamin    I don't think the brain supplement is needed   I don't recommend taking any of the rest   No vitamins are fully tested by the FDA  We don't know if it can interact with other medicines  Never take a supplement unless reviewed by oncology and myself    I think the methotrexate  and the tamoxifen  are important even though they can cause side effects You can discuss with your oncologist   Let's schedule a follow up to review memory/ cognition and do another quick memory assessment

## 2024-07-05 NOTE — Assessment & Plan Note (Signed)
 Chronic  Left worse than right  Doppler reviewed 2022 Suspect multifactorial  Venous insuff -continue your support garments and elevate legs when able  Tamoxifen  and methotrexate  may also add to some fluid retention  Avoid high sodium foods when able

## 2024-08-03 LAB — HM MAMMOGRAPHY

## 2024-08-04 ENCOUNTER — Encounter: Payer: Self-pay | Admitting: Family Medicine

## 2024-08-04 ENCOUNTER — Ambulatory Visit (INDEPENDENT_AMBULATORY_CARE_PROVIDER_SITE_OTHER): Admitting: Family Medicine

## 2024-08-04 VITALS — BP 102/64 | HR 84 | Temp 98.8°F | Ht 66.0 in | Wt 134.0 lb

## 2024-08-04 DIAGNOSIS — R4189 Other symptoms and signs involving cognitive functions and awareness: Secondary | ICD-10-CM

## 2024-08-04 DIAGNOSIS — R413 Other amnesia: Secondary | ICD-10-CM | POA: Diagnosis not present

## 2024-08-04 NOTE — Assessment & Plan Note (Addendum)
 Pt c/o some short term memory difficulties and forgetting dates  Does not miss appointments , get lost or get confused  Very social/ drives/uses GPS and has a trip coming up with a friend   Suspect multifactorial  On tamoxifen , methotrexate   Folic acid  and pnv  TSH is therap  Grief gradually getting better/ overall thinks she is doing well   MMS 27/30 today  This is down from 30 in 02/2023   Overall reassuring and plan to continue monitoring Given option for neuro visit for further eval/ pt may consider later and plans to discuss with her family   Unsure how much brain fog is caused by tamoxifen  as well/pt plans to discuss with onc   I personally spent a total of 31 minutes in the care of the patient today including preparing to see the patient, getting/reviewing separately obtained history, performing a medically appropriate exam/evaluation, counseling and educating, and documenting clinical information in the EHR.

## 2024-08-04 NOTE — Patient Instructions (Signed)
 You did fairly well on your assessment today  Score was down slightly but still pretty good   Talk to friends and family about how they think you are doing in terms of memory and cognition If there is concern we can refer you to neurology for further evaluation   You can also discuss the tamoxifen  with your oncologist if you feel this is causing significant brain fog   Stay as social and active as possible  Physically and mentally active as much as you can    Please keep us  posted and we will continue to monitor

## 2024-08-04 NOTE — Assessment & Plan Note (Signed)
 See a/p for cognitive decline MMSE 27/30

## 2024-08-04 NOTE — Progress Notes (Signed)
 Subjective:    Patient ID: Jennifer Sellers, female    DOB: 03/11/1946, 78 y.o.   MRN: [id number]  HPI  Wt Readings from Last 3 Encounters:  08/04/24 134 lb (60.8 kg)  07/05/24 135 lb 2 oz (61.3 kg)  06/24/24 131 lb 6 oz (59.6 kg)   21.63 kg/m  Vitals:   08/04/24 0802  BP: 102/64  Pulse: 84  Temp: 98.8 F (37.1 C)  SpO2: 100%    Pt presents to discuss memory/cognition   She does forget things  Sometimes it is hard to remember what day it is  Does not miss appts or meetings (a lot of checking and reminding)  Keeps a calendar and has to look at frequently  No problems taking her medicine  In a good routine  No wandering  Not getting lost  Not confused   Family/friends have not voiced concern      Lost husband 2024 Worsened her cognitive state    MMS exam 02/2023 scored 30 out of 30   Hypothyroid Lab Results  Component Value Date   TSH 1.25 06/24/2024   Levothyroxine  75 mcg daily  Lab Results  Component Value Date   VITAMINB12 412 12/23/2022   Last vitamin D  Lab Results  Component Value Date   VD25OH 32.75 06/24/2024     Supplements  Folvite     Tamoxifen   (since 2022 with a break - plan till 2029  Methotrexate    Sees onc for Large lymphocytic leukemia  Also history of breast cancer  Lab Results  Component Value Date   WBC 2.3 (L) 06/09/2024   HGB 11.1 (L) 06/09/2024   HCT 32.7 (L) 06/09/2024   MCV 100.3 (H) 06/09/2024   PLT 140 (L) 06/09/2024        06/16/2024    8:20 AM 06/09/2024    8:50 AM 03/22/2024   12:17 PM 06/15/2023    9:53 AM 02/18/2023   12:59 PM  Depression screen PHQ 2/9  Decreased Interest 0 0 0 0 0  Down, Depressed, Hopeless 0 0 0 0 1  PHQ - 2 Score 0 0 0 0 1  Altered sleeping   0  0  Tired, decreased energy   1  3  Change in appetite   0  0  Feeling bad or failure about yourself    0  0  Trouble concentrating   0  0  Moving slowly or fidgety/restless   0  0  Suicidal thoughts   0  0  PHQ-9 Score   1  4   Difficult doing work/chores     Somewhat difficult      03/22/2024   12:17 PM 02/18/2023    1:00 PM  GAD 7 : Generalized Anxiety Score  Nervous, Anxious, on Edge 1 1  Control/stop worrying 0 1  Worry too much - different things 1 1  Trouble relaxing 1 1  Restless 0 0  Easily annoyed or irritable 2 2  Afraid - awful might happen 1 1  Total GAD 7 Score 6 7    Day to day thinks mood is ok  Can get upset but then resolves the problem  Family and friends help a lot   Getting ready to take a trip to [place] with a friend  She will do the driving (she thinks)   Uses tech well   Still has grief  Tearful when talking about it  Had grief counseling  Still meets with widows for lunch monthly  with her group      Patient Active Problem List   Diagnosis Date Noted   Takes dietary supplements 07/05/2024   Left ankle swelling 06/24/2024   Ankle swelling 03/22/2024   Rosacea 03/22/2024   Colon cancer screening 06/24/2023   Allergic dermatitis 05/28/2023   Vision changes 02/18/2023   Cognitive decline 02/18/2023   Grief 02/18/2023   Short-term memory loss 02/18/2023   Fatigue 02/18/2023   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   Malignant neoplasm of upper-outer quadrant of left breast in female, 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   Estrogen deficiency 05/24/2019   H/O adenomatous polyp of colon 03/11/2018   Vitamin D  deficiency 08/03/2015   Routine general medical examination at a health care facility 05/15/2015   Rheumatoid arthritis (HCC) 06/19/2014   Thrombocytopenia (HCC) 06/19/2014   Encounter for Medicare annual wellness exam 11/11/2013   Hyperbilirubinemia 08/19/2013   Hypothyroid 09/21/2012   GOUTY TOPHI 10/23/2010   OTHER SYMPTOMS REFERABLE JOINT SITE UNSPECIFIED 10/23/2010   CARPAL TUNNEL SYNDROME, HX OF 04/05/2009   Leukocytopenia 05/02/2008   INSOMNIA, TRANSIENT 04/27/2007   Osteopenia  04/27/2007   Hyperlipidemia 04/23/2007   Diaphragmatic hernia 04/23/2007   POSTMENOPAUSAL STATUS 04/23/2007   RHEUMATOID FACTOR, POSITIVE 04/23/2007   Past Medical History:  Diagnosis Date   Arthritis    rheumatoid arthritis   Hypothyroidism    Leukopenia    Thyroid  disease    TIA (transient ischemic attack) 2011   Past Surgical History:  Procedure Laterality Date   ABDOMINAL HYSTERECTOMY     BREAST LUMPECTOMY WITH RADIOACTIVE SEED AND SENTINEL LYMPH NODE BIOPSY Left 10/05/2020   Procedure: LEFT BREAST LUMPECTOMY WITH RADIOACTIVE SEED AND SENTINEL LYMPH NODE BIOPSY;  Surgeon: [name], MD;  Location: [facility];  Service: General;  Laterality: Left;  GENERAL AND PECTORAL BLOCK   CARPAL TUNNEL RELEASE     COLONOSCOPY     COLONOSCOPY WITH PROPOFOL  N/A 06/30/2018   Procedure: COLONOSCOPY WITH PROPOFOL ;  Surgeon: Viktoria Lamar DASEN, MD;  Location: [facility];  Service: Endoscopy;  Laterality: N/A;   colpo-ureteroscopy     lens surgery     Social History   Tobacco Use   Smoking status: Never   Smokeless tobacco: Never  Vaping Use   Vaping status: Never Used  Substance Use Topics   Alcohol use: Yes    Alcohol/week: 0.0 standard drinks of alcohol    Comment: occasional   Drug use: No   Family History  Problem Relation Age of Onset   Cancer Mother        lymphoma   Cancer Brother        lymphoma   Cancer Daughter        NHL   Allergies  Allergen Reactions   Misc. Sulfonamide Containing Compounds Rash    REACTION: reaction  not known REACTION: reaction not known   Cipro [Ciprofloxacin Hcl] Other (See Comments)    Unknown    Cortisporin [Bacitra-Neomycin -Polymyxin-Hc]     Burning / swelling of eyes with eye drops   Prevnar [Pneumococcal 13-Val Conj Vacc] Other (See Comments)    Severe local reaction    Sulfa Antibiotics    Sulfonamide Derivatives     REACTION: reaction not known   Current Outpatient Medications on File Prior to Visit   Medication Sig Dispense Refill   Calcium Carb-Cholecalciferol (CALCIUM+D3 PO) Take 1 capsule by mouth daily.     folic acid  (FOLVITE ) 1 MG  tablet TAKE 1 TABLET BY MOUTH EVERY DAY 90 tablet 1   levothyroxine  (SYNTHROID ) 75 MCG tablet TAKE 1 TABLET BY MOUTH EVERY DAY BEFORE BREAKFAST 90 tablet 0   methotrexate  (RHEUMATREX) 2.5 MG tablet Take 1 tablet (2.5 mg total) by mouth once a week. Caution:Chemotherapy. Protect from light. 12 tablet 1   Multiple Vitamins-Minerals (PRESERVISION AREDS 2 PO) Take 2 capsules by mouth daily.      Polyethyl Glycol-Propyl Glycol (SYSTANE FREE OP) Apply 1 drop to eye daily as needed.     Prenatal Multivit w/ Iron-FA (NEONATAL FE PO) Take by mouth.     tamoxifen  (NOLVADEX ) 20 MG tablet TAKE 1 TABLET BY MOUTH EVERY DAY 90 tablet 1   TREXALL  15 MG tablet TAKE 1 PILL (15 MG) WITH 2.5 MG TAB ( METHOTREXATE ) FOR TOTAL DOSE OF 17.5MG , ONCE A WEEK ON SUNDAY 4 tablet 3   triamcinolone  cream (KENALOG ) 0.5 % Apply 1 Application topically 2 (two) times daily. 15 g 0   No current facility-administered medications on file prior to visit.    Review of Systems  Constitutional:  Negative for activity change, appetite change, fatigue, fever and unexpected weight change.  Neurological:  Negative for dizziness, light-headedness and headaches.  Psychiatric/Behavioral:  Positive for decreased concentration. Negative for confusion, dysphoric mood and sleep disturbance. The patient is not nervous/anxious.        Objective:   Physical Exam Constitutional:      General: She is not in acute distress.    Appearance: She is normal weight. She is not ill-appearing or diaphoretic.  Cardiovascular:     Rate and Rhythm: Normal rate and regular rhythm.  Pulmonary:     Effort: Pulmonary effort is normal. No respiratory distress.  Neurological:     Mental Status: She is alert.  Psychiatric:        Mood and Affect: Mood normal.        Behavior: Behavior normal.        Thought Content:  Thought content normal.        Judgment: Judgment normal.     Comments: Mentally fairly sharp  MMSE score 27 out of 30 Missed one recall, the date and day of week   Clock draw is normal   Mood is good  Tearful when discussing grief briefly             Assessment & Plan:   Problem List Items Addressed This Visit       Other   Short-term memory loss   See a/p for cognitive decline MMSE 27/30         Cognitive decline - Primary   Pt c/o some short term memory difficulties and forgetting dates  Does not miss appointments , get lost or get confused  Very social/ drives/uses GPS and has a trip coming up with a friend   Suspect multifactorial  On tamoxifen , methotrexate   Folic acid  and pnv  TSH is therap  Grief gradually getting better/ overall thinks she is doing well   MMS 27/30 today  This is down from 30 in 02/2023   Overall reassuring and plan to continue monitoring Given option for neuro visit for further eval/ pt may consider later and plans to discuss with her family   Unsure how much brain fog is caused by tamoxifen  as well/pt plans to discuss with onc   I personally spent a total of 31 minutes in the care of the patient today including preparing to see the patient, getting/reviewing  separately obtained history, performing a medically appropriate exam/evaluation, counseling and educating, and documenting clinical information in the EHR.

## 2024-08-09 ENCOUNTER — Encounter: Payer: Self-pay | Admitting: Family Medicine

## 2024-08-22 ENCOUNTER — Telehealth: Payer: Self-pay

## 2024-08-22 NOTE — Telephone Encounter (Signed)
 Copied from CRM [id number]. Topic: Clinical - Medication Question >> Aug 22, 2024 11:18 AM Corin V wrote: Reason for CRM: Patient called to see if [name] could verify with Dr. Randeen if a few vitamins were okay for her to take. She is taking over the counter Nature's Bounty Biotin 10,000 mcg and Biocell [employer] Vista [employer] Membrane Regeneration Dietary Supplement. She believed the Biocell may have helped with her arthritis. Please advise if she can take these or should stop.  Please call back [phone]

## 2024-08-22 NOTE — Telephone Encounter (Signed)
 Biotin can affect some lab values-like thyroid  tests/ we just need to be aware   I am not familiar with biocell- there are several types with vitamins and herbs Not tested by the FDA so hard to say if any risks  I would recommend she check in with her heme/onc specialist about that   Thanks for the heads up

## 2024-08-23 NOTE — Telephone Encounter (Signed)
Called pt and no answer and no VM (kept ringing) 

## 2024-08-30 ENCOUNTER — Other Ambulatory Visit: Payer: Self-pay | Admitting: Family Medicine

## 2024-08-31 NOTE — Telephone Encounter (Signed)
Called and still no answer

## 2024-09-02 NOTE — Telephone Encounter (Signed)
 Lvm asking pt to call back. Pls relay Dr Graham message below.

## 2024-09-04 ENCOUNTER — Other Ambulatory Visit: Payer: Self-pay | Admitting: Hematology

## 2024-09-07 NOTE — Assessment & Plan Note (Signed)
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  -bone marrow biopsy on 09/25/22 showed: Hypercellular bone marrow with an atypical T-cell lymphocytosis (30% to 60%; aspirate versus flow cytometry) consisting predominantly of CD8 positive T cells with dim CD5. Morphologically large granular lymphocytes are not detected; however, the phenotype and clinical history of rheumatoid arthritis suggest possible large granular lymphocytosis.  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  -further molecular testing showed T-cell garma gene rearrangement positive, and LGL flow cytometry showed increased T-cell large granular lymphocyte (28.6% total cells), supporting the diagnosis of LGL -she started MTX at low dose 5mg  wekely on 12/14/2022, she is tolerating it well.  Plan to increase dose to 50 mg weekly from next dose -I have referred her to LGL leukemia expert Dr. Lamona at [facility] and she was seen on 02/10/23. Her diagnose was confirmed and Dr. Lamona also recommend MTX with full dose 17.5mg  (10mg /m2) weekly.

## 2024-09-08 ENCOUNTER — Inpatient Hospital Stay: Admitting: Hematology

## 2024-09-08 ENCOUNTER — Inpatient Hospital Stay: Attending: Hematology

## 2024-09-08 VITALS — BP 108/62 | HR 77 | Temp 97.3°F | Resp 17 | Ht 66.0 in | Wt 134.7 lb

## 2024-09-08 DIAGNOSIS — Z90710 Acquired absence of both cervix and uterus: Secondary | ICD-10-CM | POA: Insufficient documentation

## 2024-09-08 DIAGNOSIS — D649 Anemia, unspecified: Secondary | ICD-10-CM | POA: Insufficient documentation

## 2024-09-08 DIAGNOSIS — M8580 Other specified disorders of bone density and structure, unspecified site: Secondary | ICD-10-CM | POA: Insufficient documentation

## 2024-09-08 DIAGNOSIS — C91Z0 Other lymphoid leukemia not having achieved remission: Secondary | ICD-10-CM | POA: Insufficient documentation

## 2024-09-08 DIAGNOSIS — M25472 Effusion, left ankle: Secondary | ICD-10-CM | POA: Diagnosis not present

## 2024-09-08 DIAGNOSIS — Z79810 Long term (current) use of selective estrogen receptor modulators (SERMs): Secondary | ICD-10-CM | POA: Insufficient documentation

## 2024-09-08 DIAGNOSIS — Z923 Personal history of irradiation: Secondary | ICD-10-CM | POA: Diagnosis not present

## 2024-09-08 DIAGNOSIS — C50412 Malignant neoplasm of upper-outer quadrant of left female breast: Secondary | ICD-10-CM | POA: Diagnosis not present

## 2024-09-08 DIAGNOSIS — Z1732 Human epidermal growth factor receptor 2 negative status: Secondary | ICD-10-CM | POA: Insufficient documentation

## 2024-09-08 DIAGNOSIS — Z170 Estrogen receptor positive status [ER+]: Secondary | ICD-10-CM | POA: Diagnosis not present

## 2024-09-08 DIAGNOSIS — Z1721 Progesterone receptor positive status: Secondary | ICD-10-CM | POA: Insufficient documentation

## 2024-09-08 DIAGNOSIS — R21 Rash and other nonspecific skin eruption: Secondary | ICD-10-CM | POA: Diagnosis not present

## 2024-09-08 LAB — COMPREHENSIVE METABOLIC PANEL WITH GFR
ALT: 8 U/L (ref 0–44)
AST: 18 U/L (ref 15–41)
Albumin: 3.7 g/dL (ref 3.5–5.0)
Alkaline Phosphatase: 48 U/L (ref 38–126)
Anion gap: 3 — ABNORMAL LOW (ref 5–15)
BUN: 12 mg/dL (ref 8–23)
CO2: 31 mmol/L (ref 22–32)
Calcium: 8.7 mg/dL — ABNORMAL LOW (ref 8.9–10.3)
Chloride: 109 mmol/L (ref 98–111)
Creatinine, Ser: 1.19 mg/dL — ABNORMAL HIGH (ref 0.44–1.00)
GFR, Estimated: 46 mL/min — ABNORMAL LOW (ref >60)
Glucose, Bld: 60 mg/dL — ABNORMAL LOW (ref 70–99)
Potassium: 4 mmol/L (ref 3.5–5.1)
Sodium: 143 mmol/L (ref 135–145)
Total Bilirubin: 1.4 mg/dL — ABNORMAL HIGH (ref 0.0–1.2)
Total Protein: 5.9 g/dL — ABNORMAL LOW (ref 6.5–8.1)

## 2024-09-08 LAB — CBC WITH DIFFERENTIAL/PLATELET
Abs Immature Granulocytes: 0.01 K/uL (ref 0.00–0.07)
Basophils Absolute: 0 K/uL (ref 0.0–0.1)
Basophils Relative: 1 %
Eosinophils Absolute: 0.1 K/uL (ref 0.0–0.5)
Eosinophils Relative: 2 %
HCT: 33.9 % — ABNORMAL LOW (ref 36.0–46.0)
Hemoglobin: 11.7 g/dL — ABNORMAL LOW (ref 12.0–15.0)
Immature Granulocytes: 0 %
Lymphocytes Relative: 26 %
Lymphs Abs: 0.9 K/uL (ref 0.7–4.0)
MCH: 34.1 pg — ABNORMAL HIGH (ref 26.0–34.0)
MCHC: 34.5 g/dL (ref 30.0–36.0)
MCV: 98.8 fL (ref 80.0–100.0)
Monocytes Absolute: 0.2 K/uL (ref 0.1–1.0)
Monocytes Relative: 6 %
Neutro Abs: 2.1 K/uL (ref 1.7–7.7)
Neutrophils Relative %: 65 %
Platelets: 146 K/uL — ABNORMAL LOW (ref 150–400)
RBC: 3.43 MIL/uL — ABNORMAL LOW (ref 3.87–5.11)
RDW: 13.1 % (ref 11.5–15.5)
WBC: 3.2 K/uL — ABNORMAL LOW (ref 4.0–10.5)
nRBC: 0 % (ref 0.0–0.2)

## 2024-09-08 LAB — FERRITIN: Ferritin: 143 ng/mL (ref 11–307)

## 2024-09-08 MED ORDER — METHOTREXATE SODIUM 15 MG PO TABS: 15.0000 mg | ORAL_TABLET | ORAL | 1 refills | Status: DC

## 2024-09-08 NOTE — Progress Notes (Signed)
 [name]   [phone] [phone]   Clinic Follow up Note   Patient Care Team: Tower, Laine LABOR, MD as PCP - General Lad, Donivan Barry, MD as Referring Physician (Ophthalmology) [name], MD as Referring Physician (Ophthalmology) [name], DC as Consulting Physician (Chiropractic Medicine) [name], RN as Oncology Nurse Navigator [name], MD as Consulting Physician (General Surgery) [name], MD as Consulting Physician (Hematology) [name], MD as Consulting Physician (Radiation Oncology) Burton, [name], NP as Nurse Practitioner (Nurse Practitioner) [name]., MD as Consulting Physician (Hematology and Oncology)  Date of Service:  09/08/2024  CHIEF COMPLAINT: f/u of LGL  CURRENT THERAPY:  Methotrexate  to 17.5 mg weekly  Oncology History   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  -bone marrow biopsy on 09/25/22 showed: Hypercellular bone marrow with an atypical T-cell lymphocytosis (30% to 60%; aspirate versus flow cytometry) consisting predominantly of CD8 positive T cells with dim CD5. Morphologically large granular lymphocytes are not detected; however, the phenotype and clinical history of rheumatoid arthritis suggest possible large granular lymphocytosis.  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  -further molecular testing showed T-cell garma gene rearrangement positive, and LGL flow cytometry showed increased T-cell large granular lymphocyte (28.6% total cells), supporting the diagnosis of LGL -she started MTX at low dose 5mg  wekely on 12/14/2022, she is tolerating it well.  Plan to increase dose to 50  mg weekly from next dose -I have referred her to LGL leukemia expert Dr. Lamona at [facility] and she was seen on 02/10/23. Her diagnose was confirmed and Dr. Lamona also recommend MTX with full dose 17.5mg  (10mg /m2) weekly.   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 She reports nail changes and easy bruising, likely side effects of methotrexate , a low-dose oral chemotherapy. Blood counts show improvement with hemoglobin at 11.7, white count at 3.2, and platelets at 146. Neutrophils have returned to normal levels. Methotrexate  is effective in controlling the disease, and discontinuation may lead to disease recurrence. Bruising is attributed to age-related frailty of blood vessels and chemotherapy effects. - Refill methotrexate  prescription for 15 mg once weekly - Recommend over-the-counter biotin for nail changes - Advise vitamin C for bruising  Left breast cancer, status post lumpectomy Left breast cancer treated with lumpectomy. Currently on tamoxifen  since February 2022, with a planned duration of five years. Tamoxifen  helps in bone density but carries a small risk of blood clots. Recent mammogram was normal. Reports tenderness at the lumpectomy site, likely due to nerve involvement from surgery. - Continue tamoxifen  therapy - Advise regular breast self-exams - Monitor for signs of blood clots, especially if swelling in one leg persists  Osteopenia Osteopenia is managed with tamoxifen , which aids in bone density. Regular physical activity is encouraged to mitigate risks associated with osteopenia. - Encourage regular physical activity to improve bone health  Mild anemia Mild anemia is present but improved with current treatment. Hemoglobin is at 11.7. Anemia may contribute to feeling cold, along with chemotherapy and low body fat. - Advise staying warm and engaging in regular exercise to improve circulation  Skin lesions and rash (face and  chin) Reports skin lesions and  rash on the face and chin, possibly related to chemotherapy. The rash is not severe and may be managed with topical treatments. - Recommend keeping the area clean - Suggest using topical antibiotics like Bactrim or Neosporin - Advise against using Kenalog  on the face; consider hydrocortisone if needed  Intermittent left ankle swelling Intermittent swelling of the left ankle, which resolves spontaneously. No significant concern unless swelling persists, which could indicate a blood clot, especially given tamoxifen  use. - Advise elevating the feet while sleeping - Suggest using compression stockings if swelling recurs - Monitor for persistent swelling and seek evaluation if it does not resolve  Memory concerns Memory concerns are noted, possibly related to age or medication effects. Engaging in activities that stimulate the brain may help preserve cognitive function. - Encourage activities that stimulate cognitive function, such as playing cards and social interactions  Plan - She is clinically doing well overall - Reviewed, neutropenia resolved, thrombocytopenia also improved.  Will continue methotrexate  17.5 mg weekly, I refilled for her -Continue tamoxifen  - Follow-up in 3 months with lab    SUMMARY OF ONCOLOGIC HISTORY: Oncology History Overview Note  Cancer Staging Malignant neoplasm of upper-outer quadrant of left breast in female, estrogen receptor positive (HCC) Staging form: Breast, AJCC 8th Edition - Clinical stage from 08/29/2020: Stage IA (cT1c, cN0, cM0, G1, ER+, PR+, HER2-) - Unsigned - Pathologic stage from 10/31/2020: Stage IA (pT2, pN0(sn), cM0, G1, ER+, PR+, HER2-, Oncotype DX score: 12) - Unsigned    Malignant neoplasm of upper-outer quadrant of left breast in female, estrogen receptor positive (HCC)  08/02/2020 Mammogram   IMPRESSION The 1cm (0.9x1.2x0.9cm on US )asymmetry in the left breast at 1:00 position, 4 cmfn is indeterminate    08/22/2020 Initial Biopsy   Diagnosis 08/22/20 Breast, left, needle core biopsy, 1 o'clock, 4cmfn -INVASIVE MAMMARY CARCINOMA -SEE COMMENT       -Grade 1 or 3  E-cadherin is NEGATIVE supporting lobular origin   08/22/2020 Receptors her2   ER - 95%  PR - 25%  Ki67 - 10% HER2 Negative on FISH   08/24/2020 Initial Diagnosis   Malignant neoplasm of upper-outer quadrant of left breast in female, estrogen receptor positive (HCC)   08/29/2020 Breast MRI   IMPRESSION: 1. Enhancing mass in the upper slightly outer left breast measuring approximately 2.4 x 1.6 x 1.8 cm, consistent with the patient's known malignancy. 2. No MRI evidence of malignancy in the right breast. 3. No suspicious lymphadenopathy.   10/05/2020 Surgery   LEFT BREAST LUMPECTOMY WITH RADIOACTIVE SEED AND SENTINEL LYMPH NODE BIOPSY by Dr Curvin    10/05/2020 Pathology Results   FINAL MICROSCOPIC DIAGNOSIS:   A. LYMPH NODE, LEFT AXILLARY #1, SENTINEL, EXCISION:  -  No carcinoma identified in one lymph node (0/1)  -  See comment   B. LYMPH NODE, LEFT AXILLARY, SENTINEL, EXCISION:  -  No carcinoma identified in one lymph node (0/1)  -  See comment   C. LYMPH NODE, LEFT AXILLARY #2, SENTINEL, EXCISION:  -  No carcinoma identified in one lymph node (0/1)  -  See comment   D. BREAST, LEFT, LUMPECTOMY:  -  Invasive lobular carcinoma, Nottingham grade 1 of 3, 2.3 cm  -  Margins uninvolved by carcinoma (<0.1 cm; superior margin)  -  Previous biopsy site changes present  -  See oncology table and comment below    10/05/2020 Oncotype testing   Oncoytpe  Recurrence Score 12 with distant recurrence risk at 9 years with AI or Tamoxifen   at 3%  There is less than 1% benefit of chemotherapy.    11/19/2020 - 12/14/2020 Radiation Therapy   Adjuvant Radiation with Dr Dewey    12/2020 -  Anti-estrogen oral therapy   Tamoxifen  20mg  daily starting 12/2020    03/13/2021 Survivorship   SCP delivered by Lacie Burton, NP       Discussed the use of AI scribe software for clinical note transcription with the patient, who gave verbal consent to proceed.  History of Present Illness Jennifer Sellers is an 78 year old female with leukemia who presents for a follow-up visit.  She has a sore spot on her nose that is painful to touch, present for a couple of weeks, and a rash on her chin that has persisted for a couple of months. She experiences vertical splitting of her nails down to the base. She is currently taking methotrexate  at a total dose of 17.5 mg weekly. She does not take folic acid .  She experiences bruising on her arms, which appeared suddenly and has mostly resolved. No tingling in her hands and feet, but she experiences cold hands and feet. Her left ankle has been intermittently swollen, more so than the right, with swelling that comes and goes.  She had a lumpectomy on her left breast in November 2021 and started tamoxifen  in February 2022, with a brief interruption due to low white blood cell count. She reports tenderness at the incision site, which occasionally becomes sore but resolves with massage. No current breast pain.  She expresses concern about her memory, wondering if it could be affected by her medication or age.     All other systems were reviewed with the patient and are negative.  MEDICAL HISTORY:  Past Medical History:  Diagnosis Date   Arthritis    rheumatoid arthritis   Hypothyroidism    Leukopenia    Thyroid  disease    TIA (transient ischemic attack) 2011    SURGICAL HISTORY: Past Surgical History:  Procedure Laterality Date   ABDOMINAL HYSTERECTOMY     BREAST LUMPECTOMY WITH RADIOACTIVE SEED AND SENTINEL LYMPH NODE BIOPSY Left 10/05/2020   Procedure: LEFT BREAST LUMPECTOMY WITH RADIOACTIVE SEED AND SENTINEL LYMPH NODE BIOPSY;  Surgeon: [name], MD;  Location: [facility];  Service: General;  Laterality: Left;  GENERAL AND PECTORAL BLOCK   CARPAL TUNNEL  RELEASE     COLONOSCOPY     COLONOSCOPY WITH PROPOFOL  N/A 06/30/2018   Procedure: COLONOSCOPY WITH PROPOFOL ;  Surgeon: Viktoria Lamar DASEN, MD;  [facility] END[facility];  Service: Endoscopy;  Laterality: N/A;   colpo-ureteroscopy     lens surgery      I have reviewed the social history and family history with the patient and they are unchanged from previous note.  ALLERGIES:  is allergic to misc. sulfonamide containing compounds, cipro [ciprofloxacin hcl], cortisporin [bacitra-neomycin -polymyxin-hc], prevnar [pneumococcal 13-val conj vacc], sulfa antibiotics, and sulfonamide derivatives.  MEDICATIONS:  Current Outpatient Medications  Medication Sig Dispense Refill   Calcium Carb-Cholecalciferol (CALCIUM+D3 PO) Take 1 capsule by mouth daily.     folic acid  (FOLVITE ) 1 MG tablet TAKE 1 TABLET BY MOUTH EVERY DAY 90 tablet 1   levothyroxine  (SYNTHROID ) 75 MCG tablet TAKE 1 TABLET BY MOUTH EVERY DAY BEFORE BREAKFAST 90 tablet 1   methotrexate  (RHEUMATREX) 2.5 MG tablet Take 1 tablet (2.5 mg total) by mouth once a week. Caution:Chemotherapy. Protect from light. 12 tablet 1   Multiple Vitamins-Minerals (PRESERVISION AREDS 2 PO) Take 2 capsules by  mouth daily.      Polyethyl Glycol-Propyl Glycol (SYSTANE FREE OP) Apply 1 drop to eye daily as needed.     Prenatal Multivit w/ Iron-FA (NEONATAL FE PO) Take by mouth.     tamoxifen  (NOLVADEX ) 20 MG tablet TAKE 1 TABLET BY MOUTH EVERY DAY 90 tablet 1   TREXALL  15 MG tablet TAKE 1 PILL (15 MG) WITH 2.5 MG TAB ( METHOTREXATE ) FOR TOTAL DOSE OF 17.5MG , ONCE A WEEK ON SUNDAY 4 tablet 3   triamcinolone  cream (KENALOG ) 0.5 % Apply 1 Application topically 2 (two) times daily. 15 g 0   methotrexate  (RHEUMATREX) 15 MG tablet Take 1 tablet (15 mg total) by mouth once a week. Caution: Chemotherapy. Protect from light. 12 tablet 1   No current facility-administered medications for this visit.    PHYSICAL EXAMINATION: ECOG PERFORMANCE STATUS: 1 - Symptomatic but  completely ambulatory  Vitals:   09/08/24 0836  BP: 108/62  Pulse: 77  Resp: 17  Temp: (!) 97.3 F (36.3 C)  SpO2: 98%   Wt Readings from Last 3 Encounters:  09/08/24 134 lb 11.2 oz (61.1 kg)  08/04/24 134 lb (60.8 kg)  07/05/24 135 lb 2 oz (61.3 kg)     GENERAL:alert, no distress and comfortable SKIN: skin color, texture, turgor are normal, no rashes or significant lesions EYES: normal, Conjunctiva are pink and non-injected, sclera clear NECK: supple, thyroid  normal size, non-tender, without nodularity LYMPH:  no palpable lymphadenopathy in the cervical, axillary  LUNGS: clear to auscultation and percussion with normal breathing effort HEART: regular rate & rhythm and no murmurs and no lower extremity edema ABDOMEN:abdomen soft, non-tender and normal bowel sounds Musculoskeletal:no cyanosis of digits and no clubbing  NEURO: alert & oriented x 3 with fluent speech, no focal motor/sensory deficits BREAST: Left breast incision tender.  No palpable breast mass or axillary adenopathy. Physical Exam   LABORATORY DATA:  I have reviewed the data as listed    Latest Ref Rng & Units 09/08/2024    8:25 AM 06/09/2024    9:16 AM 03/10/2024    7:49 AM  CBC  WBC 4.0 - 10.5 K/uL 3.2  2.3  2.4   Hemoglobin 12.0 - 15.0 g/dL 88.2  88.8  89.1   Hematocrit 36.0 - 46.0 % 33.9  32.7  32.0   Platelets 150 - 400 K/uL 146  140  128         Latest Ref Rng & Units 09/08/2024    8:25 AM 06/09/2024    9:16 AM 03/10/2024    7:49 AM  CMP  Glucose 70 - 99 mg/dL 60  92  90   BUN 8 - 23 mg/dL 12  16  18    Creatinine 0.44 - 1.00 mg/dL 8.80  8.89  8.85   Sodium 135 - 145 mmol/L 143  143  143   Potassium 3.5 - 5.1 mmol/L 4.0  4.3  4.3   Chloride 98 - 111 mmol/L 109  111  112   CO2 22 - 32 mmol/L 31  29  30    Calcium 8.9 - 10.3 mg/dL 8.7  8.3  8.5   Total Protein 6.5 - 8.1 g/dL 5.9  6.0  5.9   Total Bilirubin 0.0 - 1.2 mg/dL 1.4  1.1  1.4   Alkaline Phos 38 - 126 U/L 48  44  46   AST 15 - 41 U/L  18  17  16    ALT 0 - 44 U/L 8  8  8  RADIOGRAPHIC STUDIES: I have personally reviewed the radiological images as listed and agreed with the findings in the report. No results found.    No orders of the defined types were placed in this encounter.  All questions were answered. The patient knows to call the clinic with any problems, questions or concerns. No barriers to learning was detected. The total time spent in the appointment was 30 minutes, including review of chart and various tests results, discussions about plan of care and coordination of care plan     Onita Mattock, MD 09/08/2024

## 2024-09-15 NOTE — Telephone Encounter (Signed)
 Pt never called back so we could answer question will resolve note but if pt does follow up will relay message

## 2024-10-16 ENCOUNTER — Ambulatory Visit
Admission: EM | Admit: 2024-10-16 | Discharge: 2024-10-16 | Disposition: A | Discharge status: Home / Self Care | Attending: Nurse Practitioner | Admitting: Nurse Practitioner

## 2024-10-16 ENCOUNTER — Encounter: Payer: Self-pay | Admitting: Emergency Medicine

## 2024-10-16 DIAGNOSIS — R051 Acute cough: Secondary | ICD-10-CM | POA: Diagnosis not present

## 2024-10-16 DIAGNOSIS — J069 Acute upper respiratory infection, unspecified: Secondary | ICD-10-CM | POA: Diagnosis not present

## 2024-10-16 LAB — POC SOFIA SARS ANTIGEN FIA: SARS Coronavirus 2 Ag: NEGATIVE

## 2024-10-16 LAB — POCT INFLUENZA A/B
Influenza A, POC: NEGATIVE
Influenza B, POC: NEGATIVE

## 2024-10-16 NOTE — ED Triage Notes (Signed)
 Pt c/o cough, hoarseness, subjective fever, nausea. Started 1 day ago.

## 2024-10-16 NOTE — Discharge Instructions (Signed)
 You have a viral upper respiratory infection.  Symptoms should improve over the next week to 10 days.  If you develop chest pain or shortness of breath, go to the emergency room.  COVID-19 and influenza testing is negative today.    Some things that can make you feel better are: - Increased rest - Increasing fluid with water/sugar free electrolytes - Acetaminophen  and ibuprofen as needed for fever/pain - Salt water gargling, chloraseptic spray and throat lozenges - OTC guaifenesin  (Mucinex ) 600 mg twice daily for congestion - Saline sinus flushes or a neti pot - Humidifying the air

## 2024-10-16 NOTE — ED Provider Notes (Signed)
 [facility]    CSN: [id number] Arrival date & time: 10/16/24  1320      History   Chief Complaint Chief Complaint  Patient presents with   Cough    HPI Jennifer Sellers is a 78 y.o. female.   Patient presents today with daughter for 1 day history of bodyaches and chills, congested cough, shortness of breath, runny nose, sneezing, nausea, decreased appetite, and fatigue.  No fevers, chest pain or tightness   UPPER RESPIRATORY TRACT INFECTION Onset: COVID-19 testing history: COVID-19 vaccination status: Fever: no Body aches: yes Chills: yes Congested cough: yes Dry cough: {Blank single:19197::yes,no} Shortness of breath: yes Wheezing: {Blank single:19197::yes,no} Chest pain: no Chest tightness: no Chest congestion: {Blank single:19197::yes,no} Nasal congestion: no Sneezing Runny nose: yes Post nasal drip: {Blank single:19197::yes,no} Sore throat: {Blank single:19197::yes,no} Sinus pressure: {Blank single:19197::yes,no} Headache: {Blank single:19197::yes,no} Ear pain: {Blank single:19197::yes,no}  Abdominal pain: {Blank single:19197::yes,no} Nausea: yes Vomiting: no Diarrhea: no  Decreased appetite: yes Loss of taste/smell: {Blank single:19197::yes,no}  Rash: {Blank single:19197::yes,no} Fatigue: yes Sick contacts: {Blank single:19197::yes,no} Treatments attempted: Delsym, cough suppressant drops, wtaer, chicken soup Relief with OTC medications:     Past Medical History:  Diagnosis Date   Arthritis    rheumatoid arthritis   Hypothyroidism    Leukopenia    Thyroid  disease    TIA (transient ischemic attack) 2011    Patient Active Problem List   Diagnosis Date Noted   Takes dietary supplements 07/05/2024   Left ankle swelling 06/24/2024   Ankle swelling 03/22/2024   Rosacea 03/22/2024   Colon cancer screening 06/24/2023   Allergic dermatitis 05/28/2023   Vision changes 02/18/2023    Cognitive decline 02/18/2023   Grief 02/18/2023   Short-term memory loss 02/18/2023   Fatigue 02/18/2023   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   Malignant neoplasm of upper-outer quadrant of left breast in female, 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   Estrogen deficiency 05/24/2019   H/O adenomatous polyp of colon 03/11/2018   Vitamin D  deficiency 08/03/2015   Routine general medical examination at a health care facility 05/15/2015   Rheumatoid arthritis (HCC) 06/19/2014   Thrombocytopenia 06/19/2014   Encounter for Medicare annual wellness exam 11/11/2013   Hyperbilirubinemia 08/19/2013   Hypothyroid 09/21/2012   GOUTY TOPHI 10/23/2010   OTHER SYMPTOMS REFERABLE JOINT SITE UNSPECIFIED 10/23/2010   CARPAL TUNNEL SYNDROME, HX OF 04/05/2009   Leukocytopenia 05/02/2008   INSOMNIA, TRANSIENT 04/27/2007   Osteopenia 04/27/2007   Hyperlipidemia 04/23/2007   Diaphragmatic hernia 04/23/2007   POSTMENOPAUSAL STATUS 04/23/2007   RHEUMATOID FACTOR, POSITIVE 04/23/2007    Past Surgical History:  Procedure Laterality Date   ABDOMINAL HYSTERECTOMY     BREAST LUMPECTOMY WITH RADIOACTIVE SEED AND SENTINEL LYMPH NODE BIOPSY Left 10/05/2020   Procedure: LEFT BREAST LUMPECTOMY WITH RADIOACTIVE SEED AND SENTINEL LYMPH NODE BIOPSY;  Surgeon: [name], MD;  Location: [facility];  Service: General;  Laterality: Left;  GENERAL AND PECTORAL BLOCK   CARPAL TUNNEL RELEASE     COLONOSCOPY     COLONOSCOPY WITH PROPOFOL  N/A 06/30/2018   Procedure: COLONOSCOPY WITH PROPOFOL ;  Surgeon: Viktoria Lamar DASEN, MD;  Location: [facility] END[facility];  Service: Endoscopy;  Laterality: N/A;   colpo-ureteroscopy     lens surgery      OB History   No obstetric history on file.      Home Medications    Prior to Admission medications   Medication Sig Start Date End Date  Taking? Authorizing Provider  Calcium Carb-Cholecalciferol  (CALCIUM+D3 PO) Take 1 capsule by mouth daily.    [provider]  folic acid  (FOLVITE ) 1 MG tablet TAKE 1 TABLET BY MOUTH EVERY DAY 05/09/24   [name], MD  levothyroxine  (SYNTHROID ) 75 MCG tablet TAKE 1 TABLET BY MOUTH EVERY DAY BEFORE BREAKFAST 08/30/24   Tower, Laine LABOR, MD  methotrexate  (RHEUMATREX) 15 MG tablet Take 1 tablet (15 mg total) by mouth once a week. Caution: Chemotherapy. Protect from light. 09/08/24   [name], MD  methotrexate  (RHEUMATREX) 2.5 MG tablet Take 1 tablet (2.5 mg total) by mouth once a week. Caution:Chemotherapy. Protect from light. 05/23/24   [name], MD  Multiple Vitamins-Minerals (PRESERVISION AREDS 2 PO) Take 2 capsules by mouth daily.     [provider]  Polyethyl Glycol-Propyl Glycol (SYSTANE FREE OP) Apply 1 drop to eye daily as needed.    [provider]  Prenatal Multivit w/ Iron-FA (NEONATAL FE PO) Take by mouth.    [provider]  tamoxifen  (NOLVADEX ) 20 MG tablet TAKE 1 TABLET BY MOUTH EVERY DAY 04/18/24   Burton, [name], NP  TREXALL  15 MG tablet TAKE 1 PILL (15 MG) WITH 2.5 MG TAB ( METHOTREXATE ) FOR TOTAL DOSE OF 17.5MG , ONCE A WEEK ON SUNDAY 09/05/24   [name], MD  triamcinolone  cream (KENALOG ) 0.5 % Apply 1 Application topically 2 (two) times daily. 05/28/23   Avelina Greig BRAVO, MD    Family History Family History  Problem Relation Age of Onset   Cancer Mother        lymphoma   Cancer Brother        lymphoma   Cancer Daughter        NHL    Social History Social History   Tobacco Use   Smoking status: Never   Smokeless tobacco: Never  Vaping Use   Vaping status: Never Used  Substance Use Topics   Alcohol use: Yes    Alcohol/week: 0.0 standard drinks of alcohol    Comment: occasional   Drug use: No     Allergies   Misc. sulfonamide containing compounds, Cipro [ciprofloxacin hcl], Cortisporin [bacitra-neomycin -polymyxin-hc], Prevnar [pneumococcal 13-val conj vacc], Sulfa antibiotics, and  Sulfonamide derivatives   Review of Systems Review of Systems   Physical Exam Triage Vital Signs ED Triage Vitals  Encounter Vitals Group     BP 10/16/24 1421 113/78     Girls Systolic BP Percentile --      Girls Diastolic BP Percentile --      Boys Systolic BP Percentile --      Boys Diastolic BP Percentile --      Pulse Rate 10/16/24 1421 93     Resp 10/16/24 1421 16     Temp 10/16/24 1421 99.4 F (37.4 C)     Temp Source 10/16/24 1421 Oral     SpO2 10/16/24 1421 94 %     Weight 10/16/24 1420 134 lb 11.2 oz (61.1 kg)     Height 10/16/24 1420 5' 6 (1.676 m)     Head Circumference --      Peak Flow --      Pain Score 10/16/24 1420 0     Pain Loc --      Pain Education --      Exclude from Growth Chart --    No data found.  Updated Vital Signs BP 113/78 (BP Location: Right Arm)   Pulse 93   Temp 99.4 F (37.4 C) (  Oral)   Resp 16   Ht 5' 6 (1.676 m)   Wt 134 lb 11.2 oz (61.1 kg)   SpO2 94%   BMI 21.74 kg/m   Visual Acuity Right Eye Distance:   Left Eye Distance:   Bilateral Distance:    Right Eye Near:   Left Eye Near:    Bilateral Near:     Physical Exam   UC Treatments / Results  Labs (all labs ordered are listed, but only abnormal results are displayed) Labs Reviewed  POCT INFLUENZA A/B - Normal  POC SOFIA SARS ANTIGEN FIA - Normal    EKG   Radiology No results found.  Procedures Procedures (including critical care time)  Medications Ordered in UC Medications - No data to display  Initial Impression / Assessment and Plan / UC Course  I have reviewed the triage vital signs and the nursing notes.  Pertinent labs & imaging results that were available during my care of the patient were reviewed by me and considered in my medical decision making (see chart for details).     *** Final Clinical Impressions(s) / UC Diagnoses   Final diagnoses:  Acute cough   Discharge Instructions   None    ED Prescriptions   None    PDMP  not reviewed this encounter.

## 2024-10-18 ENCOUNTER — Other Ambulatory Visit: Payer: Self-pay | Admitting: Family Medicine

## 2024-10-18 ENCOUNTER — Ambulatory Visit: Payer: Self-pay

## 2024-10-18 MED ORDER — BENZONATATE 200 MG PO CAPS: 200.0000 mg | ORAL_CAPSULE | Freq: Three times a day (TID) | ORAL | 1 refills | Status: AC | PRN

## 2024-10-18 NOTE — Telephone Encounter (Signed)
Pt notified Rx sent to pharmacy and advised of Dr. Tower's comments  

## 2024-10-18 NOTE — Telephone Encounter (Signed)
 FYI Only or Action Required?: Action required by provider: clinical question for provider and cough medication request.  Patient was last seen in primary care on 08/04/2024 by Tower, Laine LABOR, MD.  Called Nurse Triage reporting New Med Request and Cough.  Symptoms began a week ago.  Interventions attempted: OTC medications: Adult Tussin, Delsym, Mucinex and Rest, hydration, or home remedies.  Symptoms are: gradually improving.  Triage Disposition: Home Care  Patient/caregiver understands and will follow disposition?: No, wishes to speak with PCP  Copied from CRM #[id number]. Topic: Clinical - Prescription Issue >> Oct 18, 2024  9:53 AM Macario HERO wrote: Reason for CRM: Patient said she has bronchitis and is requesting a medication from provider. Also requesting a call back from provider's nurse Reason for Disposition  Cough  Answer Assessment - Initial Assessment Questions Additional info: Evaluated at UC on 10/16/24 for cough, dx viral upper respiratory illness. She was not prescribed any medication or treatment, received home care advise and ED precautions. Overall she feels her cough has improved but still bothering her and keeping her up at night. She is using Adult Tussin, Mucinex, and Delsym which not helping with cough at night, seems to work while she is awake.  Patient requesting medication to help suppress cough at night.  Declined offered acute office visit.    1. ONSET: When did the cough begin?      UC 10/16/24 no meds prescribed  2. SEVERITY: How bad is the cough today?      Improving  3. SPUTUM: Describe the color of your sputum (e.g., none, dry cough; clear, white, yellow, green)     White and yellow  4. HEMOPTYSIS: Are you coughing up any blood? If Yes, ask: How much? (e.g., flecks, streaks, tablespoons, etc.)     Denies  5. DIFFICULTY BREATHING: Are you having difficulty breathing? If Yes, ask: How bad is it? (e.g., mild, moderate, severe)      Denies  6.  FEVER: Do you have a fever? If Yes, ask: What is your temperature, how was it measured, and when did it start?     Denies  7. CARDIAC HISTORY: Do you have any history of heart disease? (e.g., heart attack, congestive heart failure)       8. LUNG HISTORY: Do you have any history of lung disease?  (e.g., pulmonary embolus, asthma, emphysema)      9. PE RISK FACTORS: Do you have a history of blood clots? (or: recent major surgery, recent prolonged travel, bedridden)      10. OTHER SYMPTOMS: Do you have any other symptoms? (e.g., runny nose, wheezing, chest pain)       Congestion, felt feverish last night 11. PREGNANCY: Is there any chance you are pregnant? When was your last menstrual period?        12. TRAVEL: Have you traveled out of the country in the last month? (e.g., travel history, exposures)  Protocols used: Cough - Acute Productive-A-AH

## 2024-10-18 NOTE — Telephone Encounter (Signed)
 I sent tessalon to her pharmacy  Swallow whole tid prn cough  May help in addition to what she is taking over the counter    Follow up if not improving   Agree with ER /UC precautions

## 2024-10-27 ENCOUNTER — Other Ambulatory Visit: Payer: Self-pay | Admitting: Nurse Practitioner

## 2024-10-27 DIAGNOSIS — Z170 Estrogen receptor positive status [ER+]: Secondary | ICD-10-CM

## 2024-11-21 ENCOUNTER — Other Ambulatory Visit: Payer: Self-pay | Admitting: Hematology

## 2024-12-07 NOTE — Assessment & Plan Note (Signed)
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  -bone marrow biopsy on 09/25/22 showed: Hypercellular bone marrow with an atypical T-cell lymphocytosis (30% to 60%; aspirate versus flow cytometry) consisting predominantly of CD8 positive T cells with dim CD5. Morphologically large granular lymphocytes are not detected; however, the phenotype and clinical history of rheumatoid arthritis suggest possible large granular lymphocytosis.  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  -further molecular testing showed T-cell garma gene rearrangement positive, and LGL flow cytometry showed increased T-cell large granular lymphocyte (28.6% total cells), supporting the diagnosis of LGL -she started MTX at low dose 5mg  wekely on 12/14/2022, she is tolerating it well.  Plan to increase dose to 50 mg weekly from next dose -I have referred her to LGL leukemia expert Dr. Lamona at [facility] and she was seen on 02/10/23. Her diagnose was confirmed and Dr. Lamona also recommend MTX with full dose 17.5mg  (10mg /m2) weekly.

## 2024-12-08 ENCOUNTER — Other Ambulatory Visit: Payer: Self-pay | Admitting: Hematology

## 2024-12-08 ENCOUNTER — Inpatient Hospital Stay: Admitting: Hematology

## 2024-12-08 ENCOUNTER — Inpatient Hospital Stay

## 2024-12-08 ENCOUNTER — Telehealth: Payer: Self-pay

## 2024-12-08 DIAGNOSIS — C91Z0 Other lymphoid leukemia not having achieved remission: Secondary | ICD-10-CM

## 2024-12-08 NOTE — Telephone Encounter (Signed)
 Patient did not arrive to her appointments today. Contacted patient's daughter, Suzen. Was advised that the family was not aware of today's appointments, transferred call to scheduler to reschedule.

## 2024-12-20 ENCOUNTER — Telehealth: Payer: Self-pay | Admitting: Family Medicine

## 2024-12-20 NOTE — Telephone Encounter (Signed)
 Copied from CRM [id number]. Topic: Medical Record Request - Records Request >> Dec 19, 2024  4:12 PM Alfonso HERO wrote: Reason for CRM: Dr calling for office notes dated 08/04/24 be sent over to [facility]. Fax number [phone] Attn: Armida Stai.... Phone number is [phone]

## 2024-12-20 NOTE — Telephone Encounter (Signed)
 Called Jennifer Sellers at [facility] and advise her we can't send notes without a release she asked that I reach out to the pt because they have no release forms at their office.  Called pt and no answer so left VM letting her know she can stop by here and sign a release for us  to send OV notes to [facility] or she can access her OV notes on her mychart. Advise pt to call the office back if she has any questions regarding this or she can stop by and sign a release

## 2024-12-21 NOTE — Assessment & Plan Note (Signed)
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  -bone marrow biopsy on 09/25/22 showed: Hypercellular bone marrow with an atypical T-cell lymphocytosis (30% to 60%; aspirate versus flow cytometry) consisting predominantly of CD8 positive T cells with dim CD5. Morphologically large granular lymphocytes are not detected; however, the phenotype and clinical history of rheumatoid arthritis suggest possible large granular lymphocytosis.  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  -further molecular testing showed T-cell garma gene rearrangement positive, and LGL flow cytometry showed increased T-cell large granular lymphocyte (28.6% total cells), supporting the diagnosis of LGL -she started MTX at low dose 5mg  wekely on 12/14/2022, she is tolerating it well.  Plan to increase dose to 50 mg weekly from next dose -I have referred her to LGL leukemia expert Dr. Lamona at [facility] and she was seen on 02/10/23. Her diagnose was confirmed and Dr. Lamona also recommend MTX with full dose 17.5mg  (10mg /m2) weekly.

## 2024-12-22 ENCOUNTER — Inpatient Hospital Stay: Admitting: Hematology

## 2024-12-22 ENCOUNTER — Inpatient Hospital Stay

## 2024-12-22 VITALS — BP 121/87 | HR 89 | Temp 98.0°F | Resp 17 | Ht 66.0 in | Wt 137.2 lb

## 2024-12-22 DIAGNOSIS — C91Z0 Other lymphoid leukemia not having achieved remission: Secondary | ICD-10-CM

## 2024-12-22 DIAGNOSIS — C50412 Malignant neoplasm of upper-outer quadrant of left female breast: Secondary | ICD-10-CM

## 2024-12-22 LAB — CBC WITH DIFFERENTIAL/PLATELET
Abs Immature Granulocytes: 0.01 10*3/uL (ref 0.00–0.07)
Basophils Absolute: 0 10*3/uL (ref 0.0–0.1)
Basophils Relative: 1 %
Eosinophils Absolute: 0 10*3/uL (ref 0.0–0.5)
Eosinophils Relative: 1 %
HCT: 33.1 % — ABNORMAL LOW (ref 36.0–46.0)
Hemoglobin: 11.2 g/dL — ABNORMAL LOW (ref 12.0–15.0)
Immature Granulocytes: 0 %
Lymphocytes Relative: 24 %
Lymphs Abs: 1.1 10*3/uL (ref 0.7–4.0)
MCH: 33.6 pg (ref 26.0–34.0)
MCHC: 33.8 g/dL (ref 30.0–36.0)
MCV: 99.4 fL (ref 80.0–100.0)
Monocytes Absolute: 0.2 10*3/uL (ref 0.1–1.0)
Monocytes Relative: 5 %
Neutro Abs: 3.1 10*3/uL (ref 1.7–7.7)
Neutrophils Relative %: 69 %
Platelets: 153 10*3/uL (ref 150–400)
RBC: 3.33 MIL/uL — ABNORMAL LOW (ref 3.87–5.11)
RDW: 13.6 % (ref 11.5–15.5)
WBC: 4.5 10*3/uL (ref 4.0–10.5)
nRBC: 0 % (ref 0.0–0.2)

## 2024-12-22 LAB — COMPREHENSIVE METABOLIC PANEL WITH GFR
ALT: 11 U/L (ref 0–44)
AST: 23 U/L (ref 15–41)
Albumin: 4.1 g/dL (ref 3.5–5.0)
Alkaline Phosphatase: 55 U/L (ref 38–126)
Anion gap: 7 (ref 5–15)
BUN: 21 mg/dL (ref 8–23)
CO2: 29 mmol/L (ref 22–32)
Calcium: 8.7 mg/dL — ABNORMAL LOW (ref 8.9–10.3)
Chloride: 107 mmol/L (ref 98–111)
Creatinine, Ser: 1.04 mg/dL — ABNORMAL HIGH (ref 0.44–1.00)
GFR, Estimated: 54 mL/min — ABNORMAL LOW
Glucose, Bld: 52 mg/dL — ABNORMAL LOW (ref 70–99)
Potassium: 3.9 mmol/L (ref 3.5–5.1)
Sodium: 143 mmol/L (ref 135–145)
Total Bilirubin: 0.9 mg/dL (ref 0.0–1.2)
Total Protein: 6.4 g/dL — ABNORMAL LOW (ref 6.5–8.1)

## 2024-12-22 MED ORDER — METHOTREXATE SODIUM 15 MG PO TABS: 15.0000 mg | ORAL_TABLET | ORAL | 1 refills | Status: AC

## 2024-12-22 NOTE — Progress Notes (Signed)
 " [facility]   [phone] [phone]   Clinic Follow up Note   Patient Care Team: Tower, Laine LABOR, MD as PCP - General Lad, Donivan Barry, MD as Referring Physician (Ophthalmology) [name], MD as Referring Physician (Ophthalmology) [name], DC as Consulting Physician (Chiropractic Medicine) [name], RN as Oncology Nurse Navigator [name], MD as Consulting Physician (General Surgery) [name], MD as Consulting Physician (Hematology) [name], MD as Consulting Physician (Radiation Oncology) Burton, [name], NP as Nurse Practitioner (Nurse Practitioner) [name]., MD as Consulting Physician (Hematology and Oncology)  Date of Service:  12/22/2024  CHIEF COMPLAINT: f/u of LGL  CURRENT THERAPY:  Weekly methotrexate  17.5 mg   Oncology History   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  -bone marrow biopsy on 09/25/22 showed: Hypercellular bone marrow with an atypical T-cell lymphocytosis (30% to 60%; aspirate versus flow cytometry) consisting predominantly of CD8 positive T cells with dim CD5. Morphologically large granular lymphocytes are not detected; however, the phenotype and clinical history of rheumatoid arthritis suggest possible large granular lymphocytosis.  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  -further molecular testing showed T-cell garma gene rearrangement positive, and LGL flow cytometry showed increased T-cell large granular lymphocyte (28.6% total cells), supporting the diagnosis of LGL -she started MTX at low dose 5mg  wekely on 12/14/2022, she is tolerating it well.  Plan to increase dose to 50 mg  weekly from next dose -I have referred her to LGL leukemia expert Dr. Lamona at [facility] and she was seen on 02/10/23. Her diagnose was confirmed and Dr. Lamona also recommend MTX with full dose 17.5mg  (10mg /m2) weekly.   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 She has not experienced infections or significant adverse effects from methotrexate . Cognitive concerns were discussed; methotrexate -induced neurotoxicity is unlikely at her low dose, and memory issues are more likely age-related. Continued methotrexate  provides maintenance of remission, improved blood counts, and reduced risk of infection and bleeding. Discontinuation would likely result in worsening cytopenias and disease recurrence. Ongoing monitoring for cytopenias, infection, and adverse effects is necessary. - Continued methotrexate  17.5 mg weekly for LGL leukemia. - Refilled methotrexate  15 mg prescription at [facility]. - Continued folic acid  supplementation. - Provided printed laboratory results for her review. - Ordered laboratory monitoring and scheduled follow-up in 3-4 months. - Discussed ongoing monitoring with primary care physician. - Offered pharmacist consultation regarding potential supplement-drug interactions. - Advised to continue therapy unless significant cytopenias or toxicity develop, as discontinuation may lead to recurrence.  Memory loss - Probably age-related, I doubt is related to low-dose methotrexate . - I encouraged her to have memory test by her PCP  Plan - Lab reviewed, cytopenia improved, leukopenia and thrombocytopenia resolved, mild anemia stable - Continue methotrexate  to 17.5 mg weekly - Lab and follow-up in 3 months - Will check her dietary supplements with her current medications to see if there are any interactions.   SUMMARY OF ONCOLOGIC HISTORY: Oncology  History Overview Note  Cancer Staging Malignant neoplasm of upper-outer quadrant of left breast in female, estrogen receptor positive (HCC) Staging  form: Breast, AJCC 8th Edition - Clinical stage from 08/29/2020: Stage IA (cT1c, cN0, cM0, G1, ER+, PR+, HER2-) - Unsigned - Pathologic stage from 10/31/2020: Stage IA (pT2, pN0(sn), cM0, G1, ER+, PR+, HER2-, Oncotype DX score: 12) - Unsigned    Malignant neoplasm of upper-outer quadrant of left breast in female, estrogen receptor positive (HCC)  08/02/2020 Mammogram   IMPRESSION The 1cm (0.9x1.2x0.9cm on US )asymmetry in the left breast at 1:00 position, 4 cmfn is indeterminate   08/22/2020 Initial Biopsy   Diagnosis 08/22/20 Breast, left, needle core biopsy, 1 o'clock, 4cmfn -INVASIVE MAMMARY CARCINOMA -SEE COMMENT       -Grade 1 or 3  E-cadherin is NEGATIVE supporting lobular origin   08/22/2020 Receptors her2   ER - 95%  PR - 25%  Ki67 - 10% HER2 Negative on FISH   08/24/2020 Initial Diagnosis   Malignant neoplasm of upper-outer quadrant of left breast in female, estrogen receptor positive (HCC)   08/29/2020 Breast MRI   IMPRESSION: 1. Enhancing mass in the upper slightly outer left breast measuring approximately 2.4 x 1.6 x 1.8 cm, consistent with the patient's known malignancy. 2. No MRI evidence of malignancy in the right breast. 3. No suspicious lymphadenopathy.   10/05/2020 Surgery   LEFT BREAST LUMPECTOMY WITH RADIOACTIVE SEED AND SENTINEL LYMPH NODE BIOPSY by Dr Curvin    10/05/2020 Pathology Results   FINAL MICROSCOPIC DIAGNOSIS:   A. LYMPH NODE, LEFT AXILLARY #1, SENTINEL, EXCISION:  -  No carcinoma identified in one lymph node (0/1)  -  See comment   B. LYMPH NODE, LEFT AXILLARY, SENTINEL, EXCISION:  -  No carcinoma identified in one lymph node (0/1)  -  See comment   C. LYMPH NODE, LEFT AXILLARY #2, SENTINEL, EXCISION:  -  No carcinoma identified in one lymph node (0/1)  -  See comment   D. BREAST, LEFT,  LUMPECTOMY:  -  Invasive lobular carcinoma, Nottingham grade 1 of 3, 2.3 cm  -  Margins uninvolved by carcinoma (<0.1 cm; superior margin)  -  Previous biopsy site changes present  -  See oncology table and comment below    10/05/2020 Oncotype testing   Oncoytpe  Recurrence Score 12 with distant recurrence risk at 9 years with AI or Tamoxifen  at 3%  There is less than 1% benefit of chemotherapy.    11/19/2020 - 12/14/2020 Radiation Therapy   Adjuvant Radiation with Dr Dewey    12/2020 -  Anti-estrogen oral therapy   Tamoxifen  20mg  daily starting 12/2020    03/13/2021 Survivorship   SCP delivered by Lacie Burton, NP      Discussed the use of AI scribe software for clinical note transcription with the patient, who gave verbal consent to proceed.  History of Present Illness Jennifer Sellers is an 79 year old female with T-cell large granular lymphocyte (LGL) leukemia in remission and left breast invasive lobular carcinoma who presents for hematology-oncology follow-up and evaluation of memory concerns.  She remains on methotrexate  17.5 mg weekly with folic acid  since January 2024 and is adherent. She has no methotrexate -related adverse effects, infections, abnormal bleeding, or bruising.  She and her caregiver report progressive memory decline and worry about a possible link to methotrexate , though she has no formal cognitive diagnosis and is followed by her primary physician. She continues cognitive activities such as card games.  She has chronic post-nasal drip controlled with daily Flonase. She has no recent bronchitis or acute sinus infection. She follows primary care guidance to limit supplements but  is interested in restarting chiropractor-recommended supplements. She maintains a diet that she feels supports bowel regularity.  She and her caregiver ask about expected duration and benefit of continued methotrexate , including whether tapering might be possible after two years of  therapy.  Sep 08, 2024: Follow-up for LGL leukemia; methotrexate  therapy ongoing with improved blood counts and side effects of nail changes and bruising. Left breast cancer remains stable on tamoxifen  with normal recent mammogram; reports tenderness at lumpectomy site. Mild anemia improved, osteopenia managed with tamoxifen , and other symptoms (skin lesions, ankle swelling, memory concerns) monitored; overall clinically stable.     All other systems were reviewed with the patient and are negative.  MEDICAL HISTORY:  Past Medical History:  Diagnosis Date   Arthritis    rheumatoid arthritis   Hypothyroidism    Leukopenia    Thyroid  disease    TIA (transient ischemic attack) 2011    SURGICAL HISTORY: Past Surgical History:  Procedure Laterality Date   ABDOMINAL HYSTERECTOMY     BREAST LUMPECTOMY WITH RADIOACTIVE SEED AND SENTINEL LYMPH NODE BIOPSY Left 10/05/2020   Procedure: LEFT BREAST LUMPECTOMY WITH RADIOACTIVE SEED AND SENTINEL LYMPH NODE BIOPSY;  Surgeon: [name], MD;  Location: [facility];  Service: General;  Laterality: Left;  GENERAL AND PECTORAL BLOCK   CARPAL TUNNEL RELEASE     COLONOSCOPY     COLONOSCOPY WITH PROPOFOL  N/A 06/30/2018   Procedure: COLONOSCOPY WITH PROPOFOL ;  Surgeon: Viktoria Lamar DASEN, MD;  Location: [facility];  Service: Endoscopy;  Laterality: N/A;   colpo-ureteroscopy     lens surgery      I have reviewed the social history and family history with the patient and they are unchanged from previous note.  ALLERGIES:  is allergic to misc. sulfonamide containing compounds, cipro [ciprofloxacin hcl], cortisporin [bacitra-neomycin -polymyxin-hc], prevnar [pneumococcal 13-val conj vacc], sulfa antibiotics, and sulfonamide derivatives.  MEDICATIONS:  Current Outpatient Medications  Medication Sig Dispense Refill   benzonatate  (TESSALON ) 200 MG capsule Take 1 capsule (200 mg total) by mouth 3 (three) times daily as needed for cough.  Swallow whole 30 capsule 1   Calcium Carb-Cholecalciferol (CALCIUM+D3 PO) Take 1 capsule by mouth daily.     folic acid  (FOLVITE ) 1 MG tablet TAKE 1 TABLET BY MOUTH EVERY DAY 90 tablet 1   levothyroxine  (SYNTHROID ) 75 MCG tablet TAKE 1 TABLET BY MOUTH EVERY DAY BEFORE BREAKFAST 90 tablet 1   methotrexate  (RHEUMATREX) 15 MG tablet Take 1 tablet (15 mg total) by mouth once a week. Caution: Chemotherapy. Protect from light. 12 tablet 1   methotrexate  (RHEUMATREX) 2.5 MG tablet TAKE 1 TABLET (2.5 MG TOTAL) BY MOUTH ONCE A WEEK. CAUTION:CHEMOTHERAPY. PROTECT FROM LIGHT. 12 tablet 1   Multiple Vitamins-Minerals (PRESERVISION AREDS 2 PO) Take 2 capsules by mouth daily.      Polyethyl Glycol-Propyl Glycol (SYSTANE FREE OP) Apply 1 drop to eye daily as needed.     Prenatal Multivit w/ Iron-FA (NEONATAL FE PO) Take by mouth.     tamoxifen  (NOLVADEX ) 20 MG tablet TAKE 1 TABLET BY MOUTH EVERY DAY 90 tablet 1   TREXALL  15 MG tablet TAKE 1 PILL (15 MG) WITH 2.5 MG TAB ( METHOTREXATE ) FOR TOTAL DOSE OF 17.5MG , ONCE A WEEK ON SUNDAY 4 tablet 3   triamcinolone  cream (KENALOG ) 0.5 % Apply 1 Application topically 2 (two) times daily. 15 g 0   No current facility-administered medications for this visit.    PHYSICAL EXAMINATION: ECOG PERFORMANCE STATUS: 1 - Symptomatic but completely  ambulatory  Vitals:   12/22/24 1010  BP: 121/87  Pulse: 89  Resp: 17  Temp: 98 F (36.7 C)  SpO2: 100%   Wt Readings from Last 3 Encounters:  12/22/24 137 lb 3.2 oz (62.2 kg)  10/16/24 134 lb 11.2 oz (61.1 kg)  09/08/24 134 lb 11.2 oz (61.1 kg)     GENERAL:alert, no distress and comfortable SKIN: skin color, texture, turgor are normal, no rashes or significant lesions EYES: normal, Conjunctiva are pink and non-injected, sclera clear NECK: supple, thyroid  normal size, non-tender, without nodularity LYMPH:  no palpable lymphadenopathy in the cervical, axillary  LUNGS: clear to auscultation and percussion with normal  breathing effort HEART: regular rate & rhythm and no murmurs and no lower extremity edema ABDOMEN:abdomen soft, non-tender and normal bowel sounds Musculoskeletal:no cyanosis of digits and no clubbing  NEURO: alert & oriented x 3 with fluent speech, no focal motor/sensory deficits  Physical Exam    LABORATORY DATA:  I have reviewed the data as listed    Latest Ref Rng & Units 12/22/2024    9:53 AM 09/08/2024    8:25 AM 06/09/2024    9:16 AM  CBC  WBC 4.0 - 10.5 K/uL 4.5  3.2  2.3   Hemoglobin 12.0 - 15.0 g/dL 88.7  88.2  88.8   Hematocrit 36.0 - 46.0 % 33.1  33.9  32.7   Platelets 150 - 400 K/uL 153  146  140         Latest Ref Rng & Units 12/22/2024    9:53 AM 09/08/2024    8:25 AM 06/09/2024    9:16 AM  CMP  Glucose 70 - 99 mg/dL 52  60  92   BUN 8 - 23 mg/dL 21  12  16    Creatinine 0.44 - 1.00 mg/dL 8.95  8.80  8.89   Sodium 135 - 145 mmol/L 143  143  143   Potassium 3.5 - 5.1 mmol/L 3.9  4.0  4.3   Chloride 98 - 111 mmol/L 107  109  111   CO2 22 - 32 mmol/L 29  31  29    Calcium 8.9 - 10.3 mg/dL 8.7  8.7  8.3   Total Protein 6.5 - 8.1 g/dL 6.4  5.9  6.0   Total Bilirubin 0.0 - 1.2 mg/dL 0.9  1.4  1.1   Alkaline Phos 38 - 126 U/L 55  48  44   AST 15 - 41 U/L 23  18  17    ALT 0 - 44 U/L 11  8  8        RADIOGRAPHIC STUDIES: I have personally reviewed the radiological images as listed and agreed with the findings in the report. No results found.    No orders of the defined types were placed in this encounter.  All questions were answered. The patient knows to call the clinic with any problems, questions or concerns. No barriers to learning was detected. The total time spent in the appointment was 25 minutes, including review of chart and various tests results, discussions about plan of care and coordination of care plan     Onita Mattock, MD 12/22/2024     "

## 2025-03-24 ENCOUNTER — Inpatient Hospital Stay

## 2025-03-24 ENCOUNTER — Inpatient Hospital Stay: Admitting: Nurse Practitioner

## 2025-06-19 ENCOUNTER — Other Ambulatory Visit

## 2025-06-19 ENCOUNTER — Ambulatory Visit

## 2025-06-26 ENCOUNTER — Encounter: Admitting: Family Medicine
